# Patient Record
Sex: Male | Born: 1955
Health system: Southern US, Community
[De-identification: ages and names within clinical notes are randomized; demographics above are authoritative.]

## PROBLEM LIST (undated history)

## (undated) DIAGNOSIS — E119 Type 2 diabetes mellitus without complications: Secondary | ICD-10-CM

## (undated) DIAGNOSIS — N189 Chronic kidney disease, unspecified: Secondary | ICD-10-CM

## (undated) DIAGNOSIS — I639 Cerebral infarction, unspecified: Secondary | ICD-10-CM

## (undated) DIAGNOSIS — I10 Essential (primary) hypertension: Secondary | ICD-10-CM

## (undated) DIAGNOSIS — G43909 Migraine, unspecified, not intractable, without status migrainosus: Secondary | ICD-10-CM

## (undated) DIAGNOSIS — E785 Hyperlipidemia, unspecified: Secondary | ICD-10-CM

## (undated) DIAGNOSIS — M109 Gout, unspecified: Secondary | ICD-10-CM

## (undated) DIAGNOSIS — E559 Vitamin D deficiency, unspecified: Secondary | ICD-10-CM

## (undated) HISTORY — DX: Gout, unspecified: M10.9

## (undated) HISTORY — DX: Chronic kidney disease, unspecified: N18.9

## (undated) HISTORY — DX: Hyperlipidemia, unspecified: E78.5

## (undated) HISTORY — DX: Cerebral infarction, unspecified: I63.9

## (undated) HISTORY — DX: Migraine, unspecified, not intractable, without status migrainosus: G43.909

## (undated) HISTORY — PX: HERNIA REPAIR: SHX51

## (undated) HISTORY — DX: Essential (primary) hypertension: I10

---
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
## 1999-03-02 ENCOUNTER — Emergency Department (HOSPITAL_COMMUNITY): Admission: EM | Admit: 1999-03-02 | Discharge: 1999-03-02 | Payer: Self-pay | Admitting: Emergency Medicine

## 1999-03-02 ENCOUNTER — Encounter: Payer: Self-pay | Admitting: Emergency Medicine

## 1999-03-08 ENCOUNTER — Inpatient Hospital Stay (HOSPITAL_COMMUNITY): Admission: EM | Admit: 1999-03-08 | Discharge: 1999-03-11 | Payer: Self-pay | Admitting: Nephrology

## 1999-03-09 ENCOUNTER — Encounter: Payer: Self-pay | Admitting: *Deleted

## 2004-06-13 ENCOUNTER — Emergency Department (HOSPITAL_COMMUNITY): Admission: EM | Admit: 2004-06-13 | Discharge: 2004-06-13 | Payer: Self-pay | Admitting: *Deleted

## 2008-10-31 HISTORY — PX: COLONOSCOPY: SHX174

## 2009-11-04 ENCOUNTER — Encounter: Admission: RE | Admit: 2009-11-04 | Discharge: 2009-11-04 | Payer: Self-pay | Admitting: Emergency Medicine

## 2009-11-05 ENCOUNTER — Encounter: Admission: RE | Admit: 2009-11-05 | Discharge: 2009-11-05 | Payer: Self-pay | Admitting: Emergency Medicine

## 2011-03-18 NOTE — H&P (Signed)
NAME:  MAKAIO, GUNNERSON                        ACCOUNT NO.:  [id number]   MEDICAL RECORD NO.:  [id number]                   PATIENT TYPE:  EMS   LOCATION:  [facility]                                 FACILITY:  [facility]   PHYSICIAN:  Pramod P. Leonie Man, MD                 DATE OF BIRTH:  Mar 19, 1956   DATE OF ADMISSION:  06/13/2004  DATE OF DISCHARGE:                                HISTORY & PHYSICAL   REASON FOR ADMISSION:  Stroke.   HISTORY OF PRESENT ILLNESS:  Mr. Ruben Camacho is a 55 year old African-American  gentleman who developed sudden onset of right-sided body weakness and  numbness about an hour and one-half prior to coming to the emergency room.  Code stroke was called at 1:30 p.m.  I responded immediately.  The patient  was seen at 1:50 p.m.  CT scan of the head was interpreted at that time by  me which showed no acute abnormality.  The patient states that he has  developed left frontal hemicranial throbbing headache about one hour prior  to onset of focal neurological symptoms.  The headache has persisted.  There  was sudden onset of right-sided weakness and numbness and difficulty using  the right side.  This had improved slightly since he came to the emergency  room.  He denies any blurred vision, nausea, vomiting.  The patient states  he has history of similar episode in May 2000 when he had numbness and  weakness on one side of his body which lasted several hours and improved  after overnight admission.  At that time, he underwent CT scan and MRI scan  which were both unremarkable.  He denies other history of TIAs or stroke.   PAST MEDICAL HISTORY:  Not significant for hypertension, diabetes,  hyperlipidemia.   HOME MEDICATIONS:  None.   MEDICATION ALLERGIES:  None.   PAST SURGICAL HISTORY:  None.   SOCIAL HISTORY:  The patient works as a Pharmacist, hospital.  Denies smoking, drinking,  or abusing drugs.   FAMILY HISTORY:  Significant for stroke.   REVIEW OF SYSTEMS:  Not significant  for any chest pain, cough, shortness of  breath, diarrhea.   PHYSICAL EXAMINATION:  GENERAL: Anxious young male who is not in distress.  VITAL SIGNS:  Afebrile.  Pulse 80 per minute and regular,  respiratory rate  16 per minute, blood pressure 130/80.  Distal pulses well felt.  HEENT:  Head is atraumatic.  ENT exam unremarkable  NECK:  Supple without bruit.  CARDIAC:  No murmur or gallop.  LUNGS:  Clear to auscultation.  NEUROLOGIC:  The patient appears quite anxious.  He has a bizarre affect.  He has very deliberate movements on the right side.  His effort is clearly  poor and variable.  His eye movements are full without nystagmus.  Pupils  equally react to light and accommodation.  There is no facial weakness.  He  has slight  limitation of rightward gaze, but I believe this is due to lack  of cooperation.  There is no visual field deficit to bedside confrontational  testing.  He follows commands appropriately.  There is no dysarthria or  aphasia.  He is well oriented to time, place, and person.  Motor system exam  reveals poor and variable effort on the right side.  At times patient is  barely able to lift the right upper and lower extremities off the bed and  fall down quickly; however, when I hold his arm up against gravity and  distract him and leave my hand, he is able to sustain posture against  gravity for a few seconds and then abruptly he drops it down.  He splits the  midline and complains of subjective decreased touch and  pinprick on the  right side of the body including the face.  Reflexes are symmetric, but  downgoing.  Coordination is slow and impaired on the right.   DATA REVIEWED:  CT scan of the head interpreted by me shows no evidence of  acute abnormality or old stroke.   MRI scan of the brain, primary interpretation of diffusion-weighted images  reveals no evidence of acute infarction.   MRA of the intracranial circulation also reveals no high-grade vascular   stenosis.   Previous MRI scan of the brain from May 2000 shows it to be normal without  any infarction.   IMPRESSION:  A 55 year old gentleman with no significant vascular risk  factors with sudden onset of right body paresthesias and weakness with  clearly significant  underlying anxiety, stress, and variable exam likely  secondary to non-organic weakness.  Perhaps conversion reaction due to  underlying stress is more likely.   PLAN:  1. The patient will be admitted to the stroke service for observation.  2. Will consult psychiatry to help with identifying and treating his     underlying psychological distress.  3. Social work will be contacted to help take care of his children who are     at home.  The patient's wife is in [place], and he is clearly in     psychological distress from this.  4. Aspirin 81 mg a day.  5. No further stroke workup for now.  6. The patient does not have a primary care physician.  7. He may need some outpatient psychiatric followup                                                Pramod P. Leonie Man, MD    PPS/MEDQ  D:  06/13/2004  T:  06/13/2004  Job:  [id number]

## 2011-11-05 ENCOUNTER — Ambulatory Visit (INDEPENDENT_AMBULATORY_CARE_PROVIDER_SITE_OTHER): Payer: BC Managed Care – PPO

## 2011-11-05 DIAGNOSIS — M109 Gout, unspecified: Secondary | ICD-10-CM

## 2011-11-05 DIAGNOSIS — I10 Essential (primary) hypertension: Secondary | ICD-10-CM

## 2011-11-30 ENCOUNTER — Ambulatory Visit (INDEPENDENT_AMBULATORY_CARE_PROVIDER_SITE_OTHER): Payer: BC Managed Care – PPO | Admitting: Family Medicine

## 2011-11-30 DIAGNOSIS — E785 Hyperlipidemia, unspecified: Secondary | ICD-10-CM

## 2011-11-30 DIAGNOSIS — M109 Gout, unspecified: Secondary | ICD-10-CM | POA: Insufficient documentation

## 2011-11-30 DIAGNOSIS — I10 Essential (primary) hypertension: Secondary | ICD-10-CM

## 2011-11-30 DIAGNOSIS — D561 Beta thalassemia: Secondary | ICD-10-CM

## 2011-11-30 DIAGNOSIS — Z0000 Encounter for general adult medical examination without abnormal findings: Secondary | ICD-10-CM

## 2011-11-30 DIAGNOSIS — E1122 Type 2 diabetes mellitus with diabetic chronic kidney disease: Secondary | ICD-10-CM | POA: Insufficient documentation

## 2011-11-30 MED ORDER — PREDNISONE 20 MG PO TABS: 40.0000 mg | ORAL_TABLET | Freq: Every day | ORAL | Status: AC

## 2011-11-30 MED ORDER — CYCLOBENZAPRINE HCL 10 MG PO TABS: 10.0000 mg | ORAL_TABLET | Freq: Two times a day (BID) | ORAL | Status: AC | PRN

## 2011-11-30 MED ORDER — HYDROCODONE-ACETAMINOPHEN 5-500 MG PO TABS: 1.0000 | ORAL_TABLET | Freq: Three times a day (TID) | ORAL | Status: AC | PRN
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
## 2012-02-26 ENCOUNTER — Other Ambulatory Visit: Payer: Self-pay | Admitting: Family Medicine

## 2012-07-01 DIAGNOSIS — I639 Cerebral infarction, unspecified: Secondary | ICD-10-CM

## 2012-07-01 HISTORY — DX: Cerebral infarction, unspecified: I63.9

## 2012-07-01 HISTORY — PX: OTHER SURGICAL HISTORY: SHX169

## 2012-07-11 ENCOUNTER — Ambulatory Visit: Payer: BC Managed Care – PPO | Attending: Family Medicine | Admitting: Rehabilitative and Restorative Service Providers"

## 2012-07-11 DIAGNOSIS — R488 Other symbolic dysfunctions: Secondary | ICD-10-CM | POA: Insufficient documentation

## 2012-07-11 DIAGNOSIS — R269 Unspecified abnormalities of gait and mobility: Secondary | ICD-10-CM | POA: Insufficient documentation

## 2012-07-11 DIAGNOSIS — M6281 Muscle weakness (generalized): Secondary | ICD-10-CM | POA: Insufficient documentation

## 2012-07-11 DIAGNOSIS — IMO0001 Reserved for inherently not codable concepts without codable children: Secondary | ICD-10-CM | POA: Insufficient documentation

## 2012-07-11 DIAGNOSIS — I69919 Unspecified symptoms and signs involving cognitive functions following unspecified cerebrovascular disease: Secondary | ICD-10-CM | POA: Insufficient documentation

## 2012-07-11 DIAGNOSIS — I69998 Other sequelae following unspecified cerebrovascular disease: Secondary | ICD-10-CM | POA: Insufficient documentation

## 2012-07-16 ENCOUNTER — Ambulatory Visit: Payer: BC Managed Care – PPO | Admitting: Rehabilitative and Restorative Service Providers"

## 2012-07-16 ENCOUNTER — Encounter: Payer: Self-pay | Admitting: Occupational Therapy

## 2012-07-18 ENCOUNTER — Ambulatory Visit: Payer: BC Managed Care – PPO | Admitting: Rehabilitative and Restorative Service Providers"

## 2012-07-23 ENCOUNTER — Ambulatory Visit: Payer: BC Managed Care – PPO | Admitting: Rehabilitative and Restorative Service Providers"

## 2012-07-25 ENCOUNTER — Ambulatory Visit: Payer: BC Managed Care – PPO | Admitting: Rehabilitative and Restorative Service Providers"

## 2012-07-25 ENCOUNTER — Ambulatory Visit: Payer: BC Managed Care – PPO

## 2012-07-30 ENCOUNTER — Ambulatory Visit: Payer: BC Managed Care – PPO | Admitting: Rehabilitative and Restorative Service Providers"

## 2012-08-01 ENCOUNTER — Ambulatory Visit: Payer: BC Managed Care – PPO | Attending: Family Medicine | Admitting: Rehabilitative and Restorative Service Providers"

## 2012-08-01 DIAGNOSIS — R269 Unspecified abnormalities of gait and mobility: Secondary | ICD-10-CM | POA: Insufficient documentation

## 2012-08-01 DIAGNOSIS — R488 Other symbolic dysfunctions: Secondary | ICD-10-CM | POA: Insufficient documentation

## 2012-08-01 DIAGNOSIS — M6281 Muscle weakness (generalized): Secondary | ICD-10-CM | POA: Insufficient documentation

## 2012-08-01 DIAGNOSIS — I69919 Unspecified symptoms and signs involving cognitive functions following unspecified cerebrovascular disease: Secondary | ICD-10-CM | POA: Insufficient documentation

## 2012-08-01 DIAGNOSIS — I69998 Other sequelae following unspecified cerebrovascular disease: Secondary | ICD-10-CM | POA: Insufficient documentation

## 2012-08-01 DIAGNOSIS — IMO0001 Reserved for inherently not codable concepts without codable children: Secondary | ICD-10-CM | POA: Insufficient documentation

## 2012-08-06 ENCOUNTER — Ambulatory Visit: Payer: BC Managed Care – PPO | Admitting: Rehabilitative and Restorative Service Providers"

## 2012-08-08 ENCOUNTER — Ambulatory Visit: Payer: BC Managed Care – PPO | Admitting: Rehabilitative and Restorative Service Providers"

## 2012-08-13 ENCOUNTER — Ambulatory Visit: Payer: BC Managed Care – PPO | Admitting: Speech Pathology

## 2012-08-15 ENCOUNTER — Ambulatory Visit: Payer: BC Managed Care – PPO

## 2012-08-20 ENCOUNTER — Ambulatory Visit: Payer: BC Managed Care – PPO

## 2012-08-22 ENCOUNTER — Ambulatory Visit: Payer: BC Managed Care – PPO

## 2012-08-23 ENCOUNTER — Ambulatory Visit: Payer: BC Managed Care – PPO | Admitting: Rehabilitative and Restorative Service Providers"

## 2012-08-24 ENCOUNTER — Ambulatory Visit: Payer: Self-pay | Admitting: Family Medicine

## 2012-08-27 ENCOUNTER — Ambulatory Visit: Payer: BC Managed Care – PPO

## 2012-08-29 ENCOUNTER — Ambulatory Visit: Payer: BC Managed Care – PPO

## 2012-08-31 ENCOUNTER — Ambulatory Visit: Payer: BC Managed Care – PPO | Admitting: Rehabilitative and Restorative Service Providers"

## 2012-09-03 ENCOUNTER — Ambulatory Visit: Payer: BC Managed Care – PPO | Admitting: Rehabilitative and Restorative Service Providers"

## 2012-09-03 ENCOUNTER — Ambulatory Visit: Payer: BC Managed Care – PPO | Attending: Family Medicine

## 2012-09-03 DIAGNOSIS — I69919 Unspecified symptoms and signs involving cognitive functions following unspecified cerebrovascular disease: Secondary | ICD-10-CM | POA: Insufficient documentation

## 2012-09-03 DIAGNOSIS — IMO0001 Reserved for inherently not codable concepts without codable children: Secondary | ICD-10-CM | POA: Insufficient documentation

## 2012-09-03 DIAGNOSIS — I69998 Other sequelae following unspecified cerebrovascular disease: Secondary | ICD-10-CM | POA: Insufficient documentation

## 2012-09-03 DIAGNOSIS — R269 Unspecified abnormalities of gait and mobility: Secondary | ICD-10-CM | POA: Insufficient documentation

## 2012-09-03 DIAGNOSIS — R488 Other symbolic dysfunctions: Secondary | ICD-10-CM | POA: Insufficient documentation

## 2012-09-03 DIAGNOSIS — M6281 Muscle weakness (generalized): Secondary | ICD-10-CM | POA: Insufficient documentation

## 2012-09-05 ENCOUNTER — Ambulatory Visit: Payer: BC Managed Care – PPO | Admitting: Rehabilitative and Restorative Service Providers"

## 2012-09-18 ENCOUNTER — Ambulatory Visit: Payer: BC Managed Care – PPO | Admitting: Rehabilitative and Restorative Service Providers"

## 2012-09-20 ENCOUNTER — Ambulatory Visit: Payer: BC Managed Care – PPO | Admitting: Rehabilitative and Restorative Service Providers"

## 2012-09-24 ENCOUNTER — Ambulatory Visit (HOSPITAL_BASED_OUTPATIENT_CLINIC_OR_DEPARTMENT_OTHER): Payer: BC Managed Care – PPO | Attending: Family Medicine | Admitting: Radiology

## 2012-09-24 VITALS — Ht 70.0 in | Wt 207.0 lb

## 2012-09-24 DIAGNOSIS — G4733 Obstructive sleep apnea (adult) (pediatric): Secondary | ICD-10-CM | POA: Insufficient documentation

## 2012-09-25 ENCOUNTER — Ambulatory Visit: Payer: BC Managed Care – PPO | Admitting: Rehabilitative and Restorative Service Providers"

## 2012-09-30 DIAGNOSIS — R0989 Other specified symptoms and signs involving the circulatory and respiratory systems: Secondary | ICD-10-CM

## 2012-09-30 DIAGNOSIS — G4733 Obstructive sleep apnea (adult) (pediatric): Secondary | ICD-10-CM

## 2012-09-30 DIAGNOSIS — G4737 Central sleep apnea in conditions classified elsewhere: Secondary | ICD-10-CM

## 2012-09-30 DIAGNOSIS — R0609 Other forms of dyspnea: Secondary | ICD-10-CM

## 2012-09-30 DIAGNOSIS — M62838 Other muscle spasm: Secondary | ICD-10-CM

## 2012-10-01 ENCOUNTER — Ambulatory Visit: Payer: BC Managed Care – PPO | Admitting: Rehabilitative and Restorative Service Providers"

## 2012-10-01 NOTE — Procedures (Signed)
NAME:  Ruben Camacho, Ruben Camacho              ACCOUNT NO.:  [id number]  MEDICAL RECORD NO.:  [id number]          PATIENT TYPE:  OUT  LOCATION:  SLEEP CENTER                 [facility]  PHYSICIAN:  Clinton D. Annamaria Boots, MD, FCCP, FACPDATE OF BIRTH:  12/09/1955  DATE OF STUDY:  09/24/2012                           NOCTURNAL POLYSOMNOGRAM  REFERRING PHYSICIAN:  DESTRY G TAYLOR  INDICATION FOR STUDY:  Hypersomnia with sleep apnea.  EPWORTH SLEEPINESS SCORE:  10/24.  BMI 29.7, weight 207 pounds, height 70 inches, neck 17 inches.  MEDICATIONS:  Home medications are charted and reviewed.  SLEEP ARCHITECTURE:  Split study protocol.  During the diagnostic phase, total sleep time 120 minutes with sleep efficiency 70.6%.  Stage I was 2.9%, stage II 97.1%, stages III and REM were absent.  Sleep latency 48.5 minutes.  Awake after sleep onset 1.5 minutes.  Arousal index 10.5. Bedtime medication:  None.  RESPIRATORY DATA:  Split study protocol.  Apnea-hypopnea index (AHI) 15 per hour.  A total of 30 events was scored including 10 obstructive apneas and 20 hypopneas.  Events were associated with supine sleep position.  CPAP was then titrated to 8 CWP with residual central events giving an AHI of 20 per hour.  Obstructive events were completely suppressed.  He wore a medium ResMed Quattro FX full-face mask with heated humidifier.  OXYGEN DATA:  Moderate snoring before CPAP with oxygen desaturation to a nadir of 88% on room air.  With CPAP titration, snoring was prevented and mean oxygen saturation held 93.4% on room air.  CARDIAC DATA:  Normal sinus rhythm.  MOVEMENT-PARASOMNIA:  During the diagnostic phase, a total of 36 limb jerks were counted of which 1 was associated with arousal or awakening. During the titration phase, there were no limb of events.  Bathroom x1.  IMPRESSIONS-RECOMMENDATIONS: 1. Moderate obstructive and central sleep apnea/hypopnea syndrome, AHI     15 per hour.  Supine  events.  Moderate snoring with oxygen     desaturation to a nadir of 88% on room air. 2. CPAP titration to 8 CWP suppressed all obstructive events.     Residual central apneas were not affected by CPAP, as expected,     leaving a residual AHI of 20 per hour during the time interval     recorded.  He wore a medium ResMed Quattro FX full-face mask with     heated humidifier.  Snoring was prevented by CPAP, and mean oxygen     saturation held 93.4% on room air.  Limb jerks were present during     the diagnostic phase, but not     present during the titration phase, indicating they were part of an     arousal response to respiratory events, suppressed by CPAP.     Clinton D. Annamaria Boots, MD, [facility], [place], [place] Board of Sleep Medicine    CDY/MEDQ  D:  09/30/2012 17:03:24  T:  10/01/2012 03:10:27  Job:  [id number]

## 2012-10-03 ENCOUNTER — Ambulatory Visit: Payer: BC Managed Care – PPO | Admitting: Rehabilitative and Restorative Service Providers"

## 2012-10-08 ENCOUNTER — Ambulatory Visit: Payer: BC Managed Care – PPO | Admitting: Rehabilitative and Restorative Service Providers"

## 2012-10-12 ENCOUNTER — Ambulatory Visit: Payer: BC Managed Care – PPO | Admitting: Rehabilitative and Restorative Service Providers"

## 2013-03-19 ENCOUNTER — Ambulatory Visit: Payer: BC Managed Care – PPO

## 2013-03-19 ENCOUNTER — Ambulatory Visit (INDEPENDENT_AMBULATORY_CARE_PROVIDER_SITE_OTHER): Payer: BC Managed Care – PPO | Admitting: Family Medicine

## 2013-03-19 VITALS — BP 120/78 | HR 76 | Temp 99.1°F | Resp 16 | Ht 70.0 in | Wt 206.0 lb

## 2013-03-19 DIAGNOSIS — R05 Cough: Secondary | ICD-10-CM

## 2013-03-19 DIAGNOSIS — R209 Unspecified disturbances of skin sensation: Secondary | ICD-10-CM

## 2013-03-19 DIAGNOSIS — R200 Anesthesia of skin: Secondary | ICD-10-CM

## 2013-03-19 DIAGNOSIS — R5381 Other malaise: Secondary | ICD-10-CM

## 2013-03-19 DIAGNOSIS — R51 Headache: Secondary | ICD-10-CM

## 2013-03-19 DIAGNOSIS — J029 Acute pharyngitis, unspecified: Secondary | ICD-10-CM

## 2013-03-19 DIAGNOSIS — R718 Other abnormality of red blood cells: Secondary | ICD-10-CM

## 2013-03-19 DIAGNOSIS — R059 Cough, unspecified: Secondary | ICD-10-CM

## 2013-03-19 DIAGNOSIS — R202 Paresthesia of skin: Secondary | ICD-10-CM

## 2013-03-19 LAB — FERRITIN: Ferritin: 220 ng/mL (ref 22–322)

## 2013-03-19 LAB — POCT INFLUENZA A/B: Influenza B, POC: NEGATIVE

## 2013-03-19 LAB — COMPREHENSIVE METABOLIC PANEL
ALT: 26 U/L (ref 0–53)
Albumin: 4.6 g/dL (ref 3.5–5.2)
Potassium: 4.3 mEq/L (ref 3.5–5.3)
Sodium: 137 mEq/L (ref 135–145)

## 2013-03-19 LAB — POCT CBC
HCT, POC: 48.7 % (ref 43.5–53.7)
Lymph, poc: 1.7 (ref 0.6–3.4)
MCH, POC: 23.9 pg — AB (ref 27–31.2)
MCV: 76.9 fL — AB (ref 80–97)
MPV: 10.5 fL (ref 0–99.8)
Platelet Count, POC: 248 10*3/uL (ref 142–424)
RDW, POC: 16.3 %
WBC: 5.1 10*3/uL (ref 4.6–10.2)

## 2013-03-19 LAB — POCT RAPID STREP A (OFFICE): Rapid Strep A Screen: NEGATIVE

## 2013-03-19 MED ORDER — DOXYCYCLINE HYCLATE 100 MG PO TABS: 100.0000 mg | ORAL_TABLET | Freq: Two times a day (BID) | ORAL | Status: DC

## 2013-03-19 MED ORDER — ACETAMINOPHEN 325 MG PO TABS: 500.0000 mg | ORAL_TABLET | Freq: Four times a day (QID) | ORAL | Status: DC | PRN

## 2013-03-19 NOTE — Progress Notes (Signed)
[facility] [address] [place] [address] [phone]  Date:  03/19/2013   Name:  Ruben Camacho.   DOB:  1956/09/11   MRN:  [id number]  PCP:  No primary provider on file.    Chief Complaint: Sore Throat, Headache, Generalized Body Aches and Numbness   History of Present Illness:  Ruben Camacho. is a 57 y.o. very pleasant male patient who presents with the following:  Today is Tuesday.  A lot of people were sick at his job last week. He started to get sick last Tuesday- a week ago.  He noted headache, fatigue, joints aching, ST, mild cough.  He was not aware of any fever.  No chills.  He has some coughing spells, but no runny nose or sneezing.  He has had intermittent headaches since he became ill.  This is not the worst HA of his life.  He has been taking his BP medication, but has not taken anything else for his symptoms.    He had a CVA in 9/ 2013.  He had left side numbness and speech problems.  He did recover fully.  He now takes an aspirin daily, as well as BP medications.  The stroke was thought due to his HTN.    As of last Thursday he noted persistent numbness in the tip of his right long finger, and intermittent numbness in the index and ring fingers of the right hand.  Actually, he has had intermittent numbness of the right hand for about one month.   He does feel that it can be hard to hold objects in the right hand when the numbness is worse, and the fingers can be painful as well.  He has pain especially with typing. Sometimes the fingers will cramp and "curl up" as well.  He has not noted any speech problems or facial drooping.  He will sometimes have left hand numbness as well, but this goes away if he moves the hand. This does not seem similar to his stroke symptoms last year.     His PCP is in [place], [place] where he works. He was treated at [facility] for his CVA.  He had a complete w/u it was determined that his  stroke was due to HTN.  He now faithfully takes his BP medications.   Patient Active Problem List   Diagnosis Date Noted  . Hypertension 11/30/2011  . Hyperlipemia 11/30/2011  . Gout 11/30/2011  . Beta thalassemia 11/30/2011    Past Medical History  Diagnosis Date  . Hypertension   . Stroke   . Hyperlipidemia     Past Surgical History  Procedure Laterality Date  . Hernia repair      History  Substance Use Topics  . Smoking status: Never Smoker   . Smokeless tobacco: Not on file  . Alcohol Use: Not on file     Comment: former drinker    Family History  Problem Relation Age of Onset  . Stroke Mother   . Hypertension Mother   . Hypertension Daughter   . Cancer Maternal Grandmother     lung    No Known Allergies  Medication list has been reviewed and updated.  Current Outpatient Prescriptions on File Prior to Visit  Medication Sig Dispense Refill  . amLODipine (NORVASC) 10 MG tablet Take 1 tablet (10 mg total) by mouth daily. NEEDS OFFICE VISIT/LABS FOR MORE  15 tablet  0  . amLODipine-benazepril (  LOTREL) 10-20 MG per capsule Take 1 capsule by mouth daily.      Marland Kitchen lisinopril (PRINIVIL,ZESTRIL) 20 MG tablet Take 20 mg by mouth daily.      . metoprolol tartrate (LOPRESSOR) 25 MG tablet Take 25 mg by mouth 2 (two) times daily.       No current facility-administered medications on file prior to visit.    Review of Systems:  As per HPI- otherwise negative.   Physical Examination: Filed Vitals:   03/19/13 0750  BP: 120/78  Pulse: 76  Temp: 99.1 F (37.3 C)  Resp: 16   Filed Vitals:   03/19/13 0750  Height: 5\' 10"  (1.778 m)  Weight: 206 lb (93.441 kg)   Body mass index is 29.56 kg/(m^2). Ideal Body Weight: Weight in (lb) to have BMI = 25: 173.9  GEN: WDWN, NAD, Non-toxic, A & O x 3,looks well HEENT: Atraumatic, Normocephalic. Neck supple. No masses, No LAD.  Bilateral TM wnl, oropharynx normal.  PEERL,EOMI.   Ears and Nose: No external deformity. CV:  RRR, No M/G/R. No JVD. No thrill. No extra heart sounds. PULM: CTA B, no wheezes, crackles, rhonchi. No retractions. No resp. distress. No accessory muscle use. ABD: S, NT, ND EXTR: No c/c/e NEURO Normal gait. Normal strength and sensation of all extremities (except slightly decresed right hand grip strength due to hand pain), normal DTR all extremities, negative romberg, no facial droop or weakness, negative arm drift.  PSYCH: Normally interactive. Conversant. Not depressed or anxious appearing.  Calm demeanor.  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   No rash to suggest RMSF  [facility] reading (PRIMARY) by  Dr. Lorelei Pont. CXR: negative Cervical Spine: degenerative change at C4-5-6  CHEST - 2 VIEW  Comparison: None.  Findings: Cardiomediastinal silhouette appears normal. No acute pulmonary disease is noted. Bony thorax is intact.  IMPRESSION: No acute cardiopulmonary abnormality seen.   CERVICAL SPINE - COMPLETE 4+ VIEW  Comparison: None.  Findings: No fracture or spondylolisthesis is noted. Mild  degenerative disc disease is noted at C4-5 with anterior osteophyte  formation. Mild degenerative disc disease is also noted at C5-6  and C6-7 with anterior osteophyte formation. Posterior facet  joints appear intact. Minimal osteophyte formation is seen on the  left at C4-5, but no significant neural foraminal stenosis is seen  on either side.  IMPRESSION:  Degenerative disc disease is noted at C4-5, C5-6 and C6-7. No  other significant abnormality seen in the cervical spine.    Results for orders placed in visit on 03/19/13  POCT INFLUENZA A/B      Result Value Range   Influenza A, POC Negative     Influenza B, POC Negative    POCT RAPID STREP A (OFFICE)      Result Value Range   Rapid Strep A Screen Negative  Negative  POCT CBC      Result Value Range   WBC 5.1  4.6 - 10.2 K/uL   Lymph, poc 1.7  0.6 - 3.4   POC LYMPH PERCENT  33.7  10 - 50 %L   MID (cbc) 0.4  0 - 0.9   POC MID % 7.0  0 - 12 %M   POC Granulocyte 3.0  2 - 6.9   Granulocyte percent 59.3  37 - 80 %G   RBC 6.33 (*) 4.69 - 6.13 M/uL   Hemoglobin 15.1  14.1 - 18.1 g/dL   HCT, POC 48.7  43.5 - 53.7 %   MCV 76.9 (*) 80 - 97 fL   MCH, POC 23.9 (*) 27 - 31.2 pg   MCHC 31.0 (*) 31.8 - 35.4 g/dL   RDW, POC 16.3     Platelet Count, POC 248  142 - 424 K/uL   MPV 10.5  0 - 99.8 fL    Assessment and Plan: Acute pharyngitis - Plan: POCT Influenza A/B, POCT rapid strep A  Cough - Plan: DG Chest 2 View, doxycycline (VIBRA-TABS) 100 MG tablet  Other malaise and fatigue  Cough, Active - Plan: DG Chest 2 View, doxycycline (VIBRA-TABS) 100 MG tablet  Numbness and tingling in right hand - Plan: DG Cervical Spine Complete  Headache - Plan: POCT CBC, Comprehensive metabolic panel, acetaminophen (TYLENOL) tablet 487.5 mg  Microcytosis - Plan: Ferritin  Ruben Camacho is here today with illness and likely right CTS.  Will cover with doxycycline for possible bronchitis, and also in case of tick borne illness with "summer flu" like symptoms.  CXR and CBC reassuring.  Check ferritin due to microcytosis.   Placed in a non- spica right wrist splint for likely CTS.   Discussed his current numbness and weakness in detail.  Consistent with CTS and I do not suspect a current stroke.  However, he will be on alert for any signs of a stroke like he has last year. Holdman also agrees this is nothing like the CVA he had last year.)   Signed Lamar Blinks, MD

## 2013-03-19 NOTE — Patient Instructions (Addendum)
Let me know if you are not feeling better in the next couple of days. Use the doxycycline as directed and tylenol as needed.    Try the wrist brace (especially at night) for the next 2 weeks. If you do not notice any improvement in the next 7- 10 days please let us know.  If you have any weakness like when you had your stroke last year get help right away.  Also be on the look out for any speech difficulty or balance difficulty- these could also be signs of a stroke.  I will be in touch with your iron level.

## 2013-03-20 ENCOUNTER — Encounter: Payer: Self-pay | Admitting: Family Medicine

## 2013-07-10 ENCOUNTER — Ambulatory Visit: Payer: Self-pay | Admitting: Family Medicine

## 2013-07-10 VITALS — BP 122/74 | HR 104 | Temp 98.4°F | Resp 18 | Ht 68.5 in | Wt 206.0 lb

## 2013-07-10 DIAGNOSIS — Z0289 Encounter for other administrative examinations: Secondary | ICD-10-CM

## 2013-07-10 NOTE — Progress Notes (Signed)

## 2013-07-10 NOTE — Progress Notes (Signed)
[place], [place]  [id number]   [phone]  Subjective:    Patient ID: Ruben Appl., male    DOB: 05-15-1956, 57 y.o.   MRN: [id number]  HPI This 57 y.o. male presents for evaluation for administrative CPE.  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    Last physical 12/2012 in [place], [place]. Colonoscopy 2010; repeat ten years. Tetanus vaccine not sure; less than ten years. Hepatitis B series; worked for [employer]; s/p three shot vaccine. Flu vaccine never.   Eye exam every year; +glasses. Dental exam yearly.   Review of Systems  Constitutional: Negative for chills, diaphoresis and fatigue.  HENT: Negative for hearing loss and ear pain.   Eyes: Negative for photophobia and visual disturbance.  Respiratory: Negative for shortness of breath, wheezing and stridor.   Cardiovascular: Negative for chest pain, palpitations and leg swelling.  Gastrointestinal: Negative for nausea, vomiting, abdominal pain, diarrhea and constipation.  Musculoskeletal: Negative for myalgias, back pain, joint swelling, arthralgias and gait problem.  Skin: Negative for rash.  Neurological: Negative for dizziness, speech difficulty, weakness, numbness and headaches.  Psychiatric/Behavioral: Negative for dysphoric mood. The patient is not nervous/anxious.     Past Medical History  Diagnosis Date  . Hypertension   . Hyperlipidemia   . Stroke 07/01/2012    L sided weakness, slurred speech.  Admission [facility].    . Gout     1-2 exacerbations per year.  Takes Advil PRN for pain.    Past Surgical History  Procedure Laterality Date  . Hernia repair    . Admission  07/01/2012    CVA (L sided weakness, slurred speech).  [facility].  . Colonoscopy  10/31/2008    no polyps.  Repeat in 10 years.  [place].    Prior to Admission medications   Medication Sig Start Date End Date Taking? Authorizing Provider  amLODipine (NORVASC) 10 MG tablet Take 1 tablet (10 mg total)  by mouth daily. NEEDS OFFICE VISIT/LABS FOR MORE 02/26/12  Yes Ryan Lyn Hollingshead, PA-C  aspirin 325 MG tablet Take 325 mg by mouth daily.   Yes Historical Provider, MD  hydrochlorothiazide (HYDRODIURIL) 25 MG tablet Take 25 mg by mouth daily.   Yes Historical Provider, MD  lisinopril (PRINIVIL,ZESTRIL) 20 MG tablet Take 40 mg by mouth daily.    Yes Historical Provider, MD  metoprolol succinate (TOPROL-XL) 50 MG 24 hr tablet Take 50 mg by mouth daily. Take with or immediately following a meal.   Yes Historical Provider, MD  pravastatin (PRAVACHOL) 40 MG tablet Take 40 mg by mouth daily.   Yes Historical Provider, MD    No Known Allergies  History   Social History  . Marital Status: Married    Spouse Name: N/A    Number of Children: N/A  . Years of Education: N/A   Occupational History  . Not on file.   Social History Main Topics  . Smoking status: Never Smoker   . Smokeless tobacco: Not on file  . Alcohol Use: Not on file     Comment: former drinker  . Drug Use: No  . Sexual Activity: Yes    Partners: Female   Other Topics Concern  . Not on file   Social History Narrative   Marital status: married      CHildren;  2 children; no grandchildren.      Lives: with wife.      Employment:  Pharmacist, hospital; principal in  past.      Tobacco: none      Alcohol: weekends      Exercise:  Walking; weightlifting; basketball.    Family History  Problem Relation Age of Onset  . Stroke Mother   . Hypertension Mother   . Hypertension Daughter   . Cancer Maternal Grandmother     lung       Objective:   Physical Exam  Nursing note and vitals reviewed. Constitutional: He is oriented to person, place, and time. He appears well-developed and well-nourished. No distress.  HENT:  Head: Normocephalic and atraumatic.  Right Ear: External ear normal.  Left Ear: External ear normal.  Nose: Nose normal.  Mouth/Throat: Oropharynx is clear and moist.  Eyes: Conjunctivae and EOM are normal. Pupils are  equal, round, and reactive to light.  Neck: Normal range of motion. Neck supple. No thyromegaly present.  Cardiovascular: Normal rate, regular rhythm, normal heart sounds and intact distal pulses.  Exam reveals no gallop and no friction rub.   No murmur heard. Pulmonary/Chest: Effort normal and breath sounds normal. He has no wheezes. He has no rales.  Abdominal: Soft. Bowel sounds are normal. There is no tenderness. There is no rebound and no guarding. Hernia confirmed negative in the right inguinal area and confirmed negative in the left inguinal area.  Genitourinary: Testes normal and penis normal. Uncircumcised.  Musculoskeletal: Normal range of motion.  Lymphadenopathy:    He has no cervical adenopathy.  Neurological: He is alert and oriented to person, place, and time. He has normal reflexes. No cranial nerve deficit. He exhibits normal muscle tone. Coordination normal.  Skin: Skin is warm and dry. No rash noted. He is not diaphoretic. No erythema. No pallor.  Psychiatric: He has a normal mood and affect. His behavior is normal. Judgment and thought content normal.       Assessment & Plan:  Other general medical examination for administrative purposes   1. Administrative CPE: clearance for employment.  Immunizations UTD; refuses flu vaccines.  No symptoms or risk factors for TB; thus, does not warrant Tb skin testing.  Note provided.

## 2013-08-08 ENCOUNTER — Encounter: Payer: Self-pay | Admitting: Emergency Medicine

## 2013-09-07 ENCOUNTER — Ambulatory Visit (INDEPENDENT_AMBULATORY_CARE_PROVIDER_SITE_OTHER): Payer: BC Managed Care – PPO | Admitting: Family Medicine

## 2013-09-07 VITALS — BP 132/76 | HR 85 | Temp 97.9°F | Resp 16 | Ht 69.0 in | Wt 201.0 lb

## 2013-09-07 DIAGNOSIS — E785 Hyperlipidemia, unspecified: Secondary | ICD-10-CM

## 2013-09-07 DIAGNOSIS — I10 Essential (primary) hypertension: Secondary | ICD-10-CM

## 2013-09-07 DIAGNOSIS — N529 Male erectile dysfunction, unspecified: Secondary | ICD-10-CM

## 2013-09-07 LAB — COMPREHENSIVE METABOLIC PANEL
ALT: 34 U/L (ref 0–53)
AST: 28 U/L (ref 0–37)
Alkaline Phosphatase: 60 U/L (ref 39–117)
BUN: 22 mg/dL (ref 6–23)
Creat: 1.28 mg/dL (ref 0.50–1.35)
Glucose, Bld: 139 mg/dL — ABNORMAL HIGH (ref 70–99)

## 2013-09-07 LAB — LIPID PANEL: Cholesterol: 187 mg/dL (ref 0–200)

## 2013-09-07 MED ORDER — HYDROCHLOROTHIAZIDE 25 MG PO TABS: 25.0000 mg | ORAL_TABLET | Freq: Every day | ORAL | Status: DC

## 2013-09-07 MED ORDER — LISINOPRIL 40 MG PO TABS: 40.0000 mg | ORAL_TABLET | Freq: Every day | ORAL | Status: DC

## 2013-09-07 MED ORDER — AMLODIPINE BESYLATE 10 MG PO TABS: 10.0000 mg | ORAL_TABLET | Freq: Every day | ORAL | Status: DC

## 2013-09-07 MED ORDER — METOPROLOL SUCCINATE ER 50 MG PO TB24: 50.0000 mg | ORAL_TABLET | Freq: Every day | ORAL | Status: DC

## 2013-09-07 MED ORDER — SILDENAFIL CITRATE 100 MG PO TABS: 50.0000 mg | ORAL_TABLET | Freq: Every day | ORAL | Status: DC | PRN

## 2013-09-07 MED ORDER — PRAVASTATIN SODIUM 40 MG PO TABS: 40.0000 mg | ORAL_TABLET | Freq: Every day | ORAL | Status: DC

## 2013-09-07 NOTE — Progress Notes (Addendum)
Subjective:  This chart was scribed for Ruben Ray, MD by Roxan Diesel, ED scribe.  This patient was seen in room [facility] Room 1 and the patient's care was started at 8:54 AM.   Patient ID: Ruben Appl., male    DOB: 11-24-1955, 57 y.o.   MRN: [id number]  Chief Complaint  Patient presents with  . Medication Refill    hydrodiuril, lisinopril, toprol-xl, pravachol    HPI  Ruben Camacho. is a 57 y.o. male PCP: None per patient.  Recently relocated and looking for a new PCP.  Pt presents for a medication refill and labs.  He is not completely fasting - had coffee this morning with half-and-half and Splenda.  No other food or drinks today.  1. Hypertension.  CMP in May with normal creatinine at 1.27 but glucose elevated at 116.  His BP is usually in the 130s/70s at home.  He denies medication side effects to his knowledge.  2. Hyperlipidemia.  No recent lipid panel but LFT's normal in May of this year.  3. ED for 6 months.  Pt was taking Viagra and states this was effective.  He has never had testosterone levels checked.  4. History of stroke in September 2012 due to uncontrolled HTN.  States that he had some speech difficulties and left-sided deficits afterwards which were treated in therapy.  5. State he has some pain in the instep of his left foot.  He does not feel that this is gout-related and states that he may need arch support.  He has not yet attempted arch support.    Patient Active Problem List   Diagnosis Date Noted  . Hypertension 11/30/2011  . Hyperlipemia 11/30/2011  . Gout 11/30/2011  . Beta thalassemia 11/30/2011   Past Medical History  Diagnosis Date  . Hypertension   . Hyperlipidemia   . Stroke 07/01/2012    L sided weakness, slurred speech.  Admission [facility].    . Gout     1-2 exacerbations per year.  Takes Advil PRN for pain.   Past Surgical History  Procedure Laterality Date  . Hernia repair    . Admission  07/01/2012     CVA (L sided weakness, slurred speech).  [facility].  . Colonoscopy  10/31/2008    no polyps.  Repeat in 10 years.  [place].   No Known Allergies Prior to Admission medications   Medication Sig Start Date End Date Taking? Authorizing Provider  amLODipine (NORVASC) 10 MG tablet Take 1 tablet (10 mg total) by mouth daily. NEEDS OFFICE VISIT/LABS FOR MORE 02/26/12  Yes Ryan Lyn Hollingshead, PA-C  aspirin 325 MG tablet Take 325 mg by mouth daily.   Yes Historical Provider, MD  hydrochlorothiazide (HYDRODIURIL) 25 MG tablet Take 25 mg by mouth daily.   Yes Historical Provider, MD  lisinopril (PRINIVIL,ZESTRIL) 20 MG tablet Take 40 mg by mouth daily.    Yes Historical Provider, MD  metoprolol succinate (TOPROL-XL) 50 MG 24 hr tablet Take 50 mg by mouth daily. Take with or immediately following a meal.   Yes Historical Provider, MD  pravastatin (PRAVACHOL) 40 MG tablet Take 40 mg by mouth daily.   Yes Historical Provider, MD   History   Social History  . Marital Status: Married    Spouse Name: N/A    Number of Children: N/A  . Years of Education: N/A   Occupational History  . Not on file.   Social History Main Topics  . Smoking status:  Never Smoker   . Smokeless tobacco: Not on file  . Alcohol Use: No     Comment: former drinker  . Drug Use: No  . Sexual Activity: Yes    Partners: Female   Other Topics Concern  . Not on file   Social History Narrative   Marital status: married      CHildren;  2 children; no grandchildren.      Lives: with wife.      Employment:  Pharmacist, hospital; principal in past.      Tobacco: none      Alcohol: weekends      Exercise:  Walking; weightlifting; basketball.     Review of Systems  Constitutional: Negative for fatigue and unexpected weight change.  Eyes: Negative for visual disturbance.  Respiratory: Negative for cough, chest tightness and shortness of breath.   Cardiovascular: Negative for chest pain, palpitations and leg swelling.  Gastrointestinal: Negative for  abdominal pain and blood in stool.  Genitourinary:       ED  Musculoskeletal:       Pain in arch of left foot  Neurological: Negative for dizziness, light-headedness and headaches.       Objective:   Physical Exam  Vitals reviewed. Constitutional: He is oriented to person, place, and time. He appears well-developed and well-nourished.  HENT:  Head: Normocephalic and atraumatic.  Eyes: EOM are normal. Pupils are equal, round, and reactive to light.  Neck: No JVD present. Carotid bruit is not present.  Cardiovascular: Normal rate, regular rhythm and normal heart sounds.   No murmur heard. Pulmonary/Chest: Effort normal and breath sounds normal. He has no rales.  Musculoskeletal: He exhibits no edema.  Neurological: He is alert and oriented to person, place, and time.  Skin: Skin is warm and dry.  Psychiatric: He has a normal mood and affect.     Filed Vitals:   09/07/13 0840  BP: 132/76  Pulse: 85  Temp: 97.9 F (36.6 C)  TempSrc: Oral  Resp: 16  Height: 5\' 9"  (1.753 m)  Weight: 201 lb (91.173 kg)  SpO2: 96%        Assessment & Plan:   [name]. is a 57 y.o. male Unspecified essential hypertension - Plan: Comprehensive metabolic panel, metoprolol succinate (TOPROL-XL) 50 MG 24 hr tablet, hydrochlorothiazide (HYDRODIURIL) 25 MG tablet, amLODipine (NORVASC) 10 MG tablet, lisinopril (PRINIVIL,ZESTRIL) 40 MG tablet. Controlled. Hx of CVA. Refilled meds.   Hyperlipidemia - Plan: Lipid panel, pravastatin (PRAVACHOL) 40 MG tablet. Refilled. Check lipids. Goal LDL under 70? With hx of CVA.   Erectile dysfunction - Plan: Testosterone, free, total, sildenafil (VIAGRA) 100 MG tablet -1/2 to 1 prn, SED and CP precautions reviewed.   Foot pain, hx of gout, but sounds like plantar fasciitis - mild. Can try otc foot insert as planned, then rtc if not improving.   Will schedule for PCP and physical.    Meds ordered this encounter  Medications  . pravastatin  (PRAVACHOL) 40 MG tablet    Sig: Take 1 tablet (40 mg total) by mouth daily.    Dispense:  90 tablet    Refill:  1  . metoprolol succinate (TOPROL-XL) 50 MG 24 hr tablet    Sig: Take 1 tablet (50 mg total) by mouth daily. Take with or immediately following a meal.    Dispense:  90 tablet    Refill:  1  . hydrochlorothiazide (HYDRODIURIL) 25 MG tablet    Sig: Take 1 tablet (25 mg total) by  mouth daily.    Dispense:  90 tablet    Refill:  1  . amLODipine (NORVASC) 10 MG tablet    Sig: Take 1 tablet (10 mg total) by mouth daily.    Dispense:  15 tablet    Refill:  0  . lisinopril (PRINIVIL,ZESTRIL) 40 MG tablet    Sig: Take 1 tablet (40 mg total) by mouth daily.    Dispense:  90 tablet    Refill:  1  . sildenafil (VIAGRA) 100 MG tablet    Sig: Take 0.5-1 tablets (50-100 mg total) by mouth daily as needed for erectile dysfunction.    Dispense:  5 tablet    Refill:  11   Patient Instructions  Keep a record of your blood pressures outside of the office and bring them to the next office visit. We will call you to schedule a physical.  You should receive a call or letter about your lab results within the next week to 10 days.  If needed, we can recheck these labs next time after completely fasting for 8 hours.  Return to the clinic or go to the nearest emergency room if any of your symptoms worsen or new symptoms occur.         I personally performed the services described in this documentation, which was scribed in my presence. The recorded information has been reviewed and considered, and addended by me as needed.

## 2013-09-07 NOTE — Patient Instructions (Addendum)
Keep a record of your blood pressures outside of the office and bring them to the next office visit. We will call you to schedule a physical.  You should receive a call or letter about your lab results within the next week to 10 days.  If needed, we can recheck these labs next time after completely fasting for 8 hours.  Return to the clinic or go to the nearest emergency room if any of your symptoms worsen or new symptoms occur.

## 2013-09-09 LAB — TESTOSTERONE, FREE, TOTAL, SHBG: Sex Hormone Binding: 30 nmol/L (ref 13–71)

## 2013-09-09 NOTE — Progress Notes (Signed)
Left message for patient to call back regarding scheduling a cpe with a new primary physician.

## 2013-09-16 ENCOUNTER — Encounter: Payer: Self-pay | Admitting: *Deleted

## 2013-10-19 ENCOUNTER — Ambulatory Visit: Payer: BC Managed Care – PPO

## 2013-10-19 ENCOUNTER — Ambulatory Visit (INDEPENDENT_AMBULATORY_CARE_PROVIDER_SITE_OTHER): Payer: BC Managed Care – PPO | Admitting: Family Medicine

## 2013-10-19 VITALS — BP 120/72 | HR 81 | Temp 98.8°F | Resp 18 | Ht 69.0 in | Wt 198.0 lb

## 2013-10-19 DIAGNOSIS — R51 Headache: Secondary | ICD-10-CM

## 2013-10-19 DIAGNOSIS — R059 Cough, unspecified: Secondary | ICD-10-CM

## 2013-10-19 DIAGNOSIS — R05 Cough: Secondary | ICD-10-CM

## 2013-10-19 DIAGNOSIS — J111 Influenza due to unidentified influenza virus with other respiratory manifestations: Secondary | ICD-10-CM

## 2013-10-19 LAB — POCT CBC
Granulocyte percent: 48.4 %G (ref 37–80)
HCT, POC: 48.7 % (ref 43.5–53.7)
Hemoglobin: 15.2 g/dL (ref 14.1–18.1)
Lymph, poc: 1.8 (ref 0.6–3.4)
MCH, POC: 24.2 pg — AB (ref 27–31.2)
MCHC: 31.2 g/dL — AB (ref 31.8–35.4)
MCV: 77.6 fL — AB (ref 80–97)
MID (cbc): 0.4 (ref 0–0.9)
MPV: 10.5 fL (ref 0–99.8)
POC Granulocyte: 2 (ref 2–6.9)
POC LYMPH PERCENT: 42.1 %L (ref 10–50)
POC MID %: 9.5 %M (ref 0–12)
Platelet Count, POC: 207 10*3/uL (ref 142–424)
RBC: 6.28 M/uL — AB (ref 4.69–6.13)
RDW, POC: 16.2 %
WBC: 4.2 10*3/uL — AB (ref 4.6–10.2)

## 2013-10-19 LAB — POCT INFLUENZA A/B
Influenza A, POC: NEGATIVE
Influenza B, POC: NEGATIVE

## 2013-10-19 MED ORDER — HYDROCOD POLST-CHLORPHEN POLST 10-8 MG/5ML PO LQCR: 5.0000 mL | Freq: Two times a day (BID) | ORAL | Status: DC | PRN

## 2013-10-19 MED ORDER — OSELTAMIVIR PHOSPHATE 75 MG PO CAPS: 75.0000 mg | ORAL_CAPSULE | Freq: Two times a day (BID) | ORAL | Status: DC

## 2013-10-19 NOTE — Patient Instructions (Signed)

## 2013-10-19 NOTE — Progress Notes (Signed)
Patient Name: Ruben Camacho. Date of Birth: Apr 11, 1956 Medical Record Number: [id number] Gender: male Date of Encounter: 10/19/2013  Chief Complaint: Generalized Body Aches, Headache, Cough, Fever and Chills   History of Present Illness:  Ruben Camacho. is a 57 y.o. very pleasant male with a h/o HTN, gout, and hyperlipidemia patient who presents with the following:  Pt presents to [facility] c/o gradual onset, gradually worsening generalized body aches that initially started 3 days ago. He also reports HA, a dry cough, fever, and chills. He states when he coughs it is typically constant. He denies trying anything OTC for his current symptoms. He reports experiencing multiple episodes of diarrhea and vomiting last week which have both resolved.  He denies being a smoker. Pt is typically around people due to the nature of his work, but denies any known sick contacts.  Pt works for [employer]  Patient Active Problem List   Diagnosis Date Noted   Hypertension 11/30/2011   Hyperlipemia 11/30/2011   Gout 11/30/2011   Beta thalassemia 11/30/2011   Past Medical History  Diagnosis Date   Hypertension    Hyperlipidemia    Stroke 07/01/2012    L sided weakness, slurred speech.  Admission [facility].     Gout     1-2 exacerbations per year.  Takes Advil PRN for pain.   Past Surgical History  Procedure Laterality Date   Hernia repair     Admission  07/01/2012    CVA (L sided weakness, slurred speech).  [facility].   Colonoscopy  10/31/2008    no polyps.  Repeat in 10 years.  [place].   History  Substance Use Topics   Smoking status: Never Smoker    Smokeless tobacco: Not on file   Alcohol Use: No     Comment: former drinker   Family History  Problem Relation Age of Onset   Stroke Mother    Hypertension Mother    Hypertension Daughter    Cancer Maternal Grandmother     lung   No Known Allergies  Medication list has been reviewed and  updated.  Current Outpatient Prescriptions on File Prior to Visit  Medication Sig Dispense Refill   amLODipine (NORVASC) 10 MG tablet Take 1 tablet (10 mg total) by mouth daily.  15 tablet  0   aspirin 325 MG tablet Take 325 mg by mouth daily.       hydrochlorothiazide (HYDRODIURIL) 25 MG tablet Take 1 tablet (25 mg total) by mouth daily.  90 tablet  1   lisinopril (PRINIVIL,ZESTRIL) 40 MG tablet Take 1 tablet (40 mg total) by mouth daily.  90 tablet  1   metoprolol succinate (TOPROL-XL) 50 MG 24 hr tablet Take 1 tablet (50 mg total) by mouth daily. Take with or immediately following a meal.  90 tablet  1   pravastatin (PRAVACHOL) 40 MG tablet Take 1 tablet (40 mg total) by mouth daily.  90 tablet  1   sildenafil (VIAGRA) 100 MG tablet Take 0.5-1 tablets (50-100 mg total) by mouth daily as needed for erectile dysfunction.  5 tablet  11   No current facility-administered medications on file prior to visit.    Review of Systems:    Physical Examination: Filed Vitals:   10/19/13 0822  BP: 120/72  Pulse: 81  Temp: 98.8 F (37.1 C)  Resp: 18   @vitals2 @ Body mass index is 29.23 kg/(m^2). Ideal Body Weight: @FLOWAMB [id number] Patient appears to be acutely ill although  is alert and follows commands appropriately. He's lying down when I came in the room but he was able to get up and me examine him without problem. HEENT: Mild erythema and throat, otherwise negative Neck: Supple no adenopathy Chest: Clear, violent cough Heart: Regular no murmur Neuro: Moving 4 she weighs equally no obvious weakness focally Skin: Without rash  EKG / Labs / Xrays: [facility] reading (PRIMARY) by  Dr. Joseph Art:  Persistently elevated right hemidiaphragm Results for orders placed in visit on 10/19/13  POCT CBC      Result Value Range   WBC 4.2 (*) 4.6 - 10.2 K/uL   Lymph, poc 1.8  0.6 - 3.4   POC LYMPH PERCENT 42.1  10 - 50 %L   MID (cbc) 0.4  0 - 0.9   POC MID % 9.5  0 - 12 %M   POC  Granulocyte 2.0  2 - 6.9   Granulocyte percent 48.4  37 - 80 %G   RBC 6.28 (*) 4.69 - 6.13 M/uL   Hemoglobin 15.2  14.1 - 18.1 g/dL   HCT, POC 48.7  43.5 - 53.7 %   MCV 77.6 (*) 80 - 97 fL   MCH, POC 24.2 (*) 27 - 31.2 pg   MCHC 31.2 (*) 31.8 - 35.4 g/dL   RDW, POC 16.2     Platelet Count, POC 207  142 - 424 K/uL   MPV 10.5  0 - 99.8 fL    Assessment and Plan:  Cough - Plan: POCT Influenza A/B, POCT CBC, DG Chest 2 View, chlorpheniramine-HYDROcodone (TUSSIONEX PENNKINETIC ER) 10-8 MG/5ML LQCR  [id number]) - Plan: POCT Influenza A/B, POCT CBC, DG Chest 2 View  Influenza - Plan: oseltamivir (TAMIFLU) 75 MG capsule  Signed, Robyn Haber, MD

## 2013-11-07 ENCOUNTER — Ambulatory Visit (INDEPENDENT_AMBULATORY_CARE_PROVIDER_SITE_OTHER): Payer: BC Managed Care – PPO | Admitting: Family Medicine

## 2013-11-07 VITALS — BP 152/92 | HR 76 | Temp 98.9°F | Resp 18 | Wt 207.0 lb

## 2013-11-07 DIAGNOSIS — J029 Acute pharyngitis, unspecified: Secondary | ICD-10-CM

## 2013-11-07 DIAGNOSIS — R05 Cough: Secondary | ICD-10-CM

## 2013-11-07 DIAGNOSIS — J040 Acute laryngitis: Secondary | ICD-10-CM

## 2013-11-07 DIAGNOSIS — R058 Other specified cough: Secondary | ICD-10-CM

## 2013-11-07 LAB — POCT RAPID STREP A (OFFICE): RAPID STREP A SCREEN: NEGATIVE

## 2013-11-07 MED ORDER — MAGIC MOUTHWASH W/LIDOCAINE: 10.0000 mL | ORAL | Status: DC | PRN

## 2013-11-07 NOTE — Patient Instructions (Signed)
Viral Pharyngitis Viral pharyngitis is a viral infection that produces redness, pain, and swelling (inflammation) of the throat. It can spread from person to person (contagious). CAUSES Viral pharyngitis is caused by inhaling a large amount of certain germs called viruses. Many different viruses cause viral pharyngitis. SYMPTOMS Symptoms of viral pharyngitis include:  Sore throat.  Tiredness.  Stuffy nose.  Low-grade fever.  Congestion.  Cough. TREATMENT Treatment includes rest, drinking plenty of fluids, and the use of over-the-counter medication (approved by your caregiver). HOME CARE INSTRUCTIONS   Drink enough fluids to keep your urine clear or pale yellow.  Eat soft, cold foods such as ice cream, frozen ice pops, or gelatin dessert.  Gargle with warm salt water (1 tsp salt per 1 qt of water).  If over age 7, throat lozenges may be used safely.  Only take over-the-counter or prescription medicines for pain, discomfort, or fever as directed by your caregiver. Do not take aspirin. To help prevent spreading viral pharyngitis to others, avoid:  Mouth-to-mouth contact with others.  Sharing utensils for eating and drinking.  Coughing around others. SEEK MEDICAL CARE IF:   You are better in a few days, then become worse.  You have a fever or pain not helped by pain medicines.  There are any other changes that concern you. Document Released: 07/27/2005 Document Revised: 01/09/2012 Document Reviewed: 12/23/2010 [employer] Patient Information 2014 [employer].  

## 2013-11-07 NOTE — Progress Notes (Signed)
Subjective: Patient had the flu a couple of weeks ago. He was doing better from that but still had some cough and has been taking some cough medicine. Tuesday he began getting a sore throat and laryngitis. He has not had any fever. Some headache. No runny nose. Mild cough still. He does not smoke.  Objective: Pleasant gentleman who is worse. His TMs are normal though partially occluded by cerumen. Throat erythematous without exudate. Neck was supple without significant nodes. Chest clear to auscultation. Heart regular without murmurs.  Assessment: Pharyngitis Recent influenza with post flu cough  Plan: Strep screen  Results for orders placed in visit on 11/07/13  POCT RAPID STREP A (OFFICE)      Result Value Range   Rapid Strep A Screen Negative  Negative   Viral pharyngitis and laryngitis. 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 We'll go ahead and give him an excuse for tomorrow which will allow him the weekend to let this pass back. Drink plenty of fluids. Return if significant fever or purulent drainage

## 2013-11-09 LAB — CULTURE, GROUP A STREP: ORGANISM ID, BACTERIA: NORMAL

## 2013-11-11 ENCOUNTER — Ambulatory Visit (INDEPENDENT_AMBULATORY_CARE_PROVIDER_SITE_OTHER): Payer: BC Managed Care – PPO | Admitting: Internal Medicine

## 2013-11-11 VITALS — BP 126/78 | HR 90 | Temp 99.0°F | Resp 17 | Ht 70.0 in | Wt 200.0 lb

## 2013-11-11 DIAGNOSIS — R05 Cough: Secondary | ICD-10-CM

## 2013-11-11 DIAGNOSIS — J040 Acute laryngitis: Secondary | ICD-10-CM

## 2013-11-11 DIAGNOSIS — R059 Cough, unspecified: Secondary | ICD-10-CM

## 2013-11-11 LAB — POCT CBC
Granulocyte percent: 64 %G (ref 37–80)
HEMATOCRIT: 51.5 % (ref 43.5–53.7)
Hemoglobin: 16.2 g/dL (ref 14.1–18.1)
Lymph, poc: 1.7 (ref 0.6–3.4)
MCH: 24.5 pg — AB (ref 27–31.2)
MCHC: 31.5 g/dL — AB (ref 31.8–35.4)
MCV: 77.8 fL — AB (ref 80–97)
MID (CBC): 0.4 (ref 0–0.9)
MPV: 9.5 fL (ref 0–99.8)
PLATELET COUNT, POC: 262 10*3/uL (ref 142–424)
POC GRANULOCYTE: 3.7 (ref 2–6.9)
POC LYMPH PERCENT: 29.4 %L (ref 10–50)
POC MID %: 6.6 %M (ref 0–12)
RBC: 6.62 M/uL — AB (ref 4.69–6.13)
RDW, POC: 16.7 %
WBC: 5.8 10*3/uL (ref 4.6–10.2)

## 2013-11-11 MED ORDER — AMOXICILLIN 500 MG PO CAPS
1000.0000 mg | ORAL_CAPSULE | Freq: Two times a day (BID) | ORAL | Status: DC
Start: 2013-11-11 — End: 2014-01-25

## 2013-11-11 MED ORDER — HYDROCODONE-ACETAMINOPHEN 7.5-325 MG/15ML PO SOLN: 5.0000 mL | Freq: Four times a day (QID) | ORAL | Status: DC | PRN

## 2013-11-11 MED ORDER — PREDNISONE 10 MG PO TABS: ORAL_TABLET | ORAL | Status: DC

## 2013-11-11 NOTE — Patient Instructions (Signed)
Laryngitis At the top of your windpipe is your voice box. It is the source of your voice. Inside your voice box are 2 bands of muscles called vocal cords. When you breathe, your vocal cords are relaxed and open so that air can get into the lungs. When you decide to say something, these cords come together and vibrate. The sound from these vibrations goes into your throat and comes out through your mouth as sound. Laryngitis is an inflammation of the vocal cords that causes hoarseness, cough, loss of voice, sore throat, and dry throat. Laryngitis can be temporary (acute) or long-term (chronic). Most cases of acute laryngitis improve with time.Chronic laryngitis lasts for more than 3 weeks. CAUSES Laryngitis can often be related to excessive smoking, talking, or yelling, as well as inhalation of toxic fumes and allergies. Acute laryngitis is usually caused by a viral infection, vocal strain, measles or mumps, or bacterial infections. Chronic laryngitis is usually caused by vocal cord strain, vocal cord injury, postnasal drip, growths on the vocal cords, or acid reflux. SYMPTOMS   Cough.  Sore throat.  Dry throat. RISK FACTORS  Respiratory infections.  Exposure to irritating substances, such as cigarette smoke, excessive amounts of alcohol, stomach acids, and workplace chemicals.  Voice trauma, such as vocal cord injury from shouting or speaking too loud. DIAGNOSIS  Your cargiver will perform a physical exam. During the physical exam, your caregiver will examine your throat. The most common sign of laryngitis is hoarseness. Laryngoscopy may be necessary to confirm the diagnosis of this condition. This procedure allows your caregiver to look into the larynx. HOME CARE INSTRUCTIONS  Drink enough fluids to keep your urine clear or pale yellow.  Rest until you no longer have symptoms or as directed by your caregiver.  Breathe in moist air.  Take all medicine as directed by your  caregiver.  Do not smoke.  Talk as little as possible (this includes whispering).  Write on paper instead of talking until your voice is back to normal.  Follow up with your caregiver if your condition has not improved after 10 days. SEEK MEDICAL CARE IF:   You have trouble breathing.  You cough up blood.  You have persistent fever.  You have increasing pain.  You have difficulty swallowing. MAKE SURE YOU:  Understand these instructions.  Will watch your condition.  Will get help right away if you are not doing well or get worse. Document Released: 10/17/2005 Document Revised: 01/09/2012 Document Reviewed: 12/23/2010 [facility] Patient Information 2014 [place], [place].

## 2013-11-11 NOTE — Progress Notes (Signed)
   Subjective:    Patient ID: Ruben Appl., male    DOB: 1956/04/29, 58 y.o.   MRN: [id number]  HPI 58 y.o. Male presents to clinic with sore throat, cough and headache that started last Thursday. Symptoms have gotten worse since last seen on 11/07/13. Has been using throat spray and lozenges prescribed but states that only provided temporary relief. Has some vomiting and nausea this morning. Denies any body aches with current symptoms. Had flu 4 weeks ago, but states that symptoms are now in his throat which is the worst symptom. 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 Had negative strep test at last visit on 11/07/13.   Review of Systems     Objective:   Physical Exam  Constitutional: He appears well-developed and well-nourished. He is cooperative.  Non-toxic appearance. He appears ill. No distress.  HENT:  Head: Normocephalic.  Right Ear: External ear normal.  Left Ear: External ear normal.  Nose: Nose normal.  Mouth/Throat: Posterior oropharyngeal edema and posterior oropharyngeal erythema present.  Neck: Normal range of motion. Neck supple.  Cardiovascular: Normal rate.   Pulmonary/Chest: Effort normal.  Musculoskeletal: Normal range of motion.  Neurological: He is alert.  Skin: No rash noted.  Psychiatric: He has a normal mood and affect.   3+ hoarsenes  Results for orders placed in visit on 11/11/13  POCT CBC      Result Value Range   WBC 5.8  4.6 - 10.2 K/uL   Lymph, poc 1.7  0.6 - 3.4   POC LYMPH PERCENT 29.4  10 - 50 %L   MID (cbc) 0.4  0 - 0.9   POC MID % 6.6  0 - 12 %M   POC Granulocyte 3.7  2 - 6.9   Granulocyte percent 64.0  37 - 80 %G   RBC 6.62 (*) 4.69 - 6.13 M/uL   Hemoglobin 16.2  14.1 - 18.1 g/dL   HCT, POC 51.5  43.5 - 53.7 %   MCV 77.8 (*) 80 - 97 fL   MCH, POC 24.5 (*) 27 - 31.2 pg   MCHC 31.5 (*) 31.8 - 35.4 g/dL   RDW, POC 16.7     Platelet Count, POC 262  142 - 424 K/uL   MPV 9.5  0 - 99.8 fL          Assessment & Plan:  Laryngitis/Fever/Cough Lortab/Amoxil/Prednisone

## 2014-01-25 ENCOUNTER — Ambulatory Visit (INDEPENDENT_AMBULATORY_CARE_PROVIDER_SITE_OTHER): Payer: BC Managed Care – PPO | Admitting: Internal Medicine

## 2014-01-25 ENCOUNTER — Ambulatory Visit: Payer: BC Managed Care – PPO

## 2014-01-25 VITALS — BP 122/80 | HR 64 | Temp 98.7°F | Resp 16 | Ht 70.0 in | Wt 204.2 lb

## 2014-01-25 DIAGNOSIS — M25579 Pain in unspecified ankle and joints of unspecified foot: Secondary | ICD-10-CM

## 2014-01-25 MED ORDER — MELOXICAM 15 MG PO TABS: 15.0000 mg | ORAL_TABLET | Freq: Every day | ORAL | Status: DC

## 2014-01-25 NOTE — Progress Notes (Signed)
   Subjective:    Patient ID: Ruben Appl., male    DOB: 03-Feb-1956, 58 y.o.   MRN: [id number]  HPI 58 year old male presents for evaluation of left heel and foot pain x 4 days. States it started acutely and has gotten slightly worse since onset. Has now hit a plateau and pain is not worsening but also does not seem to be getting any better.  He has taken ibuprofen 400 mg bid which does relieve the pain but as soon as the medication wears off the pain returns. No hx of similar problems. Admits he did have ankle pain about 3 months ago and started wearing an arch supports which has helped.  No hx of prior ankle/foot fractures.  Denies paresthesias or weakness. Does have pain that is radiating down his foot secondary to gait change.  Patient is otherwise doing well with no other concerns today.     Review of Systems  Musculoskeletal: Positive for gait problem (pain with ambulating).  Skin: Negative for color change.  Neurological: Negative for weakness and numbness.       Objective:   Physical Exam  Constitutional: He is oriented to person, place, and time. He appears well-developed and well-nourished.  HENT:  Head: Normocephalic and atraumatic.  Right Ear: External ear normal.  Left Ear: External ear normal.  Eyes: Conjunctivae are normal.  Neck: Normal range of motion.  Cardiovascular: Normal rate.   Pulmonary/Chest: Effort normal.  Neurological: He is alert and oriented to person, place, and time.  Psychiatric: He has a normal mood and affect. His behavior is normal. Judgment and thought content normal.    [facility] reading (PRIMARY) by  Dr. Laney Pastor as no acute bony abnormality.       Assessment & Plan:   Pain in joint, ankle and foot - Plan: DG Foot Complete Left, meloxicam (MOBIC) 15 MG tablet  Heel pain, negative x-ray so likely tendinitis vs bone bruise.   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  If no improvement in 7-10 days will refer to Podiatry for evaluation and treatment, sooner if worse.   I have completed the patient encounter in its entirety as documented by [facility], with editing by me where necessary. Robert P. Laney Pastor, M.D.

## 2014-02-21 ENCOUNTER — Other Ambulatory Visit: Payer: Self-pay | Admitting: Physician Assistant

## 2014-02-21 NOTE — Telephone Encounter (Signed)
Nira Conn, it looks like you were the primary provider at [facility]? Do you want to give RFs on Mobic or have pt RTC?

## 2014-03-11 ENCOUNTER — Other Ambulatory Visit: Payer: Self-pay | Admitting: Family Medicine

## 2014-03-13 ENCOUNTER — Ambulatory Visit: Payer: BC Managed Care – PPO

## 2014-03-13 ENCOUNTER — Ambulatory Visit (INDEPENDENT_AMBULATORY_CARE_PROVIDER_SITE_OTHER): Payer: BC Managed Care – PPO | Admitting: Physician Assistant

## 2014-03-13 VITALS — BP 148/92 | HR 82 | Temp 99.0°F | Resp 18 | Ht 68.5 in | Wt 205.6 lb

## 2014-03-13 DIAGNOSIS — R0602 Shortness of breath: Secondary | ICD-10-CM

## 2014-03-13 DIAGNOSIS — R059 Cough, unspecified: Secondary | ICD-10-CM

## 2014-03-13 DIAGNOSIS — R05 Cough: Secondary | ICD-10-CM

## 2014-03-13 LAB — POCT CBC
GRANULOCYTE PERCENT: 51 % (ref 37–80)
HCT, POC: 44.4 % (ref 43.5–53.7)
HEMOGLOBIN: 14.3 g/dL (ref 14.1–18.1)
LYMPH, POC: 1.4 (ref 0.6–3.4)
MCH: 23.8 pg — AB (ref 27–31.2)
MCHC: 32.2 g/dL (ref 31.8–35.4)
MCV: 74 fL — AB (ref 80–97)
MID (cbc): 0.4 (ref 0–0.9)
MPV: 10 fL (ref 0–99.8)
PLATELET COUNT, POC: 182 10*3/uL (ref 142–424)
POC GRANULOCYTE: 1.9 — AB (ref 2–6.9)
POC LYMPH PERCENT: 37.8 %L (ref 10–50)
POC MID %: 11.2 %M (ref 0–12)
RBC: 6 M/uL (ref 4.69–6.13)
RDW, POC: 15.9 %
WBC: 3.8 10*3/uL — AB (ref 4.6–10.2)

## 2014-03-13 MED ORDER — ALBUTEROL SULFATE (2.5 MG/3ML) 0.083% IN NEBU
2.5000 mg | INHALATION_SOLUTION | Freq: Once | RESPIRATORY_TRACT | Status: AC
  Administered 2014-03-13: 2.5 mg via RESPIRATORY_TRACT

## 2014-03-13 MED ORDER — IPRATROPIUM BROMIDE 0.02 % IN SOLN
0.5000 mg | Freq: Once | RESPIRATORY_TRACT | Status: AC
  Administered 2014-03-13: 0.5 mg via RESPIRATORY_TRACT

## 2014-03-13 MED ORDER — PREDNISONE 20 MG PO TABS: ORAL_TABLET | ORAL | Status: DC

## 2014-03-13 MED ORDER — ALBUTEROL SULFATE HFA 108 (90 BASE) MCG/ACT IN AERS: 2.0000 | INHALATION_SPRAY | RESPIRATORY_TRACT | Status: DC | PRN

## 2014-03-13 NOTE — Progress Notes (Signed)
I have examined this patient along with the student and agree.  

## 2014-03-13 NOTE — Patient Instructions (Signed)
Get plenty of rest and drink at least 64oz of water each day  If symptoms worsen, return for further evaluation and treatment  We think this was triggered by allergies however due to your SOB and no history of SOB or lung conditions, we will treat this with prednisone dose pack to reduce the inflammation causing your symptoms.

## 2014-03-13 NOTE — Progress Notes (Signed)
Subjective:    Patient ID: Ruben Appl., male    DOB: 11/13/1955, 58 y.o.   MRN: [id number]  HPI  58y.o male with hyperlipidemia and hypertension presents with SOB for past two days with associated cough, HA, congestion.  He describes a coughing attack that forced him to vomit at the onset of symptoms, but denies nausea otherwise.  Cough is productive of yellow sputum.  Has never had SOB like this before and has no hx of asthma or other lung conditions.  Denies tobacco use, chest pain, leg swelling, fever, diarrhea.  Denies sick contacts, recent change in medications, recent travel, and is not usually bothered by seasonal allergies.   Review of Systems  Constitutional: Negative for fever, chills and fatigue.  HENT: Positive for congestion and ear pain. Negative for sinus pressure.   Eyes: Negative.   Respiratory: Positive for cough, shortness of breath and wheezing.   Cardiovascular: Negative for chest pain, palpitations and leg swelling.  Gastrointestinal: Negative for nausea, vomiting, abdominal pain, diarrhea and abdominal distention.  Endocrine: Negative.   Genitourinary: Negative.   Musculoskeletal: Negative for arthralgias and myalgias.  Skin: Negative for rash.  Allergic/Immunologic: Negative.   Neurological: Positive for headaches. Negative for dizziness, weakness and numbness.       Objective:   Physical Exam  Constitutional: He is oriented to person, place, and time. He appears well-developed and well-nourished. No distress.  Audible wheezing BP 148/92  Pulse 82  Temp(Src) 99 F (37.2 C) (Oral)  Resp 18  Ht 5' 8.5" (1.74 m)  Wt 205 lb 9.6 oz (93.26 kg)  BMI 30.80 kg/m2  SpO2 96%   HENT:  Head: Normocephalic.  Right Ear: Tympanic membrane and ear canal normal.  Left Ear: Tympanic membrane and ear canal normal.  Nose: Nose normal.  Mouth/Throat: Uvula is midline and oropharynx is clear and moist. No oropharyngeal exudate.  Eyes: Conjunctivae are normal.  Pupils are equal, round, and reactive to light.  Neck: Normal range of motion. No JVD present.  Cardiovascular: Normal rate, regular rhythm and intact distal pulses.  Exam reveals no gallop and no friction rub.   No murmur heard. Pulmonary/Chest: Effort normal. No respiratory distress. Wheezes: what initially appear to be wheezes are actually upper airway sounds and resolve completely during examination, before neb treatment. He exhibits no tenderness.  Musculoskeletal: He exhibits no edema.  Lymphadenopathy:    He has no cervical adenopathy.  Neurological: He is alert and oriented to person, place, and time.  Skin: Skin is warm and dry. No rash noted.  Psychiatric: He has a normal mood and affect. His behavior is normal.    CXR reviewed by Dr. Brigitte Pulse:  No acute infiltrates Nebulized albuterol and ipratropium treatment given in office: post treatment breath sounds louder in all fields; no wheezing heard  Results for orders placed in visit on 03/13/14  POCT CBC      Result Value Ref Range   WBC 3.8 (*) 4.6 - 10.2 K/uL   Lymph, poc 1.4  0.6 - 3.4   POC LYMPH PERCENT 37.8  10 - 50 %L   MID (cbc) 0.4  0 - 0.9   POC MID % 11.2  0 - 12 %M   POC Granulocyte 1.9 (*) 2 - 6.9   Granulocyte percent 51.0  37 - 80 %G   RBC 6.00  4.69 - 6.13 M/uL   Hemoglobin 14.3  14.1 - 18.1 g/dL   HCT, POC 44.4  43.5 - 53.7 %  MCV 74.0 (*) 80 - 97 fL   MCH, POC 23.8 (*) 27 - 31.2 pg   MCHC 32.2  31.8 - 35.4 g/dL   RDW, POC 15.9     Platelet Count, POC 182  142 - 424 K/uL   MPV 10.0  0 - 99.8 fL        Assessment & Plan:   1. Cough 2. SOB (shortness of breath) CXR shows no acute infiltrates.  Prescribed rescue albuterol inhaler due to improvement seen with nebulized treatment in office.  Symptoms probably triggered by allergies.  Pt will return if symptoms worsen. - POCT CBC - DG Chest 2 View; Future - albuterol (PROVENTIL) (2.5 MG/3ML) 0.083% nebulizer solution 2.5 mg; Take 3 mLs (2.5 mg total) by  nebulization once. - ipratropium (ATROVENT) nebulizer solution 0.5 mg; Take 2.5 mLs (0.5 mg total) by nebulization once. - predniSONE (DELTASONE) 20 MG tablet; Take 3 PO QAM x3days, 2 PO QAM x3days, 1 PO QAM x3days  Dispense: 18 tablet; Refill: 0 - albuterol (PROVENTIL HFA;VENTOLIN HFA) 108 (90 BASE) MCG/ACT inhaler; Inhale 2 puffs into the lungs every 4 (four) hours as needed for wheezing or shortness of breath (cough, shortness of breath or wheezing.).  Dispense: 1 Inhaler; Refill: 1

## 2014-03-21 ENCOUNTER — Other Ambulatory Visit: Payer: Self-pay | Admitting: Family Medicine

## 2014-03-26 ENCOUNTER — Other Ambulatory Visit: Payer: Self-pay | Admitting: Physician Assistant

## 2014-04-19 ENCOUNTER — Telehealth: Payer: Self-pay

## 2014-04-19 ENCOUNTER — Ambulatory Visit (INDEPENDENT_AMBULATORY_CARE_PROVIDER_SITE_OTHER): Payer: BC Managed Care – PPO | Admitting: Family Medicine

## 2014-04-19 VITALS — BP 136/80 | HR 74 | Temp 98.0°F | Ht 69.0 in | Wt 202.6 lb

## 2014-04-19 DIAGNOSIS — I10 Essential (primary) hypertension: Secondary | ICD-10-CM

## 2014-04-19 DIAGNOSIS — L03111 Cellulitis of right axilla: Secondary | ICD-10-CM

## 2014-04-19 DIAGNOSIS — E785 Hyperlipidemia, unspecified: Secondary | ICD-10-CM

## 2014-04-19 DIAGNOSIS — IMO0002 Reserved for concepts with insufficient information to code with codable children: Secondary | ICD-10-CM

## 2014-04-19 DIAGNOSIS — M109 Gout, unspecified: Secondary | ICD-10-CM

## 2014-04-19 DIAGNOSIS — Z79899 Other long term (current) drug therapy: Secondary | ICD-10-CM

## 2014-04-19 LAB — POCT CBC
Granulocyte percent: 69 %G (ref 37–80)
HCT, POC: 37.6 % — AB (ref 43.5–53.7)
Hemoglobin: 11.5 g/dL — AB (ref 14.1–18.1)
LYMPH, POC: 1.1 (ref 0.6–3.4)
MCH: 22.6 pg — AB (ref 27–31.2)
MCHC: 30.6 g/dL — AB (ref 31.8–35.4)
MCV: 73.9 fL — AB (ref 80–97)
MID (CBC): 0.3 (ref 0–0.9)
MPV: 10.3 fL (ref 0–99.8)
PLATELET COUNT, POC: 161 10*3/uL (ref 142–424)
POC Granulocyte: 3.2 (ref 2–6.9)
POC LYMPH %: 24.7 % (ref 10–50)
POC MID %: 6.3 % (ref 0–12)
RBC: 5.09 M/uL (ref 4.69–6.13)
RDW, POC: 15.2 %
WBC: 4.6 10*3/uL (ref 4.6–10.2)

## 2014-04-19 LAB — LIPID PANEL
CHOL/HDL RATIO: 3.6 ratio
CHOLESTEROL: 179 mg/dL (ref 0–200)
HDL: 50 mg/dL (ref >39)
LDL Cholesterol: 102 mg/dL — ABNORMAL HIGH (ref 0–99)
Triglycerides: 133 mg/dL (ref <150)
VLDL: 27 mg/dL (ref 0–40)

## 2014-04-19 LAB — COMPREHENSIVE METABOLIC PANEL
ALT: 24 U/L (ref 0–53)
AST: 30 U/L (ref 0–37)
Albumin: 4.6 g/dL (ref 3.5–5.2)
Alkaline Phosphatase: 62 U/L (ref 39–117)
BILIRUBIN TOTAL: 0.6 mg/dL (ref 0.2–1.2)
BUN: 17 mg/dL (ref 6–23)
CHLORIDE: 99 meq/L (ref 96–112)
CO2: 26 mEq/L (ref 19–32)
CREATININE: 1.22 mg/dL (ref 0.50–1.35)
Calcium: 9.7 mg/dL (ref 8.4–10.5)
Glucose, Bld: 89 mg/dL (ref 70–99)
Potassium: 4.6 mEq/L (ref 3.5–5.3)
Sodium: 138 mEq/L (ref 135–145)
Total Protein: 8.1 g/dL (ref 6.0–8.3)

## 2014-04-19 LAB — URIC ACID: URIC ACID, SERUM: 7.5 mg/dL (ref 4.0–7.8)

## 2014-04-19 MED ORDER — PRAVASTATIN SODIUM 40 MG PO TABS: 40.0000 mg | ORAL_TABLET | Freq: Every day | ORAL | Status: DC

## 2014-04-19 MED ORDER — AMLODIPINE BESYLATE 10 MG PO TABS: 10.0000 mg | ORAL_TABLET | Freq: Every day | ORAL | Status: DC

## 2014-04-19 MED ORDER — INDOMETHACIN 50 MG PO CAPS: 50.0000 mg | ORAL_CAPSULE | Freq: Three times a day (TID) | ORAL | Status: DC

## 2014-04-19 MED ORDER — COLCHICINE 0.6 MG PO TABS: ORAL_TABLET | ORAL | Status: DC

## 2014-04-19 MED ORDER — LISINOPRIL 40 MG PO TABS: 40.0000 mg | ORAL_TABLET | Freq: Every day | ORAL | Status: DC

## 2014-04-19 MED ORDER — HYDROCHLOROTHIAZIDE 25 MG PO TABS: 25.0000 mg | ORAL_TABLET | Freq: Every day | ORAL | Status: DC

## 2014-04-19 MED ORDER — DOXYCYCLINE HYCLATE 100 MG PO CAPS: 100.0000 mg | ORAL_CAPSULE | Freq: Two times a day (BID) | ORAL | Status: DC

## 2014-04-19 MED ORDER — METOPROLOL SUCCINATE ER 50 MG PO TB24
50.0000 mg | ORAL_TABLET | Freq: Every day | ORAL | Status: DC
Start: 2014-04-19 — End: 2015-01-08

## 2014-04-19 NOTE — Telephone Encounter (Signed)
Patient wants some type of pain medication for gout. He was seen today by Dr. Brigitte Pulse. Please advise. He forgot to ask this while being seen today.   [phone]

## 2014-04-19 NOTE — Progress Notes (Deleted)
Subjective:   This chart was scribed for Delman Cheadle, MD, by Neta Ehlers, ED Scribe. This patient's care was started 9:48 AM.    Patient ID: Ruben Appl., male    DOB: April 20, 1956, 58 y.o.   MRN: [id number]  Chief Complaint  Patient presents with   Gout    to the right big toe x's 4 days   Recurrent Skin Infections    to the right axillary area x's 1 week    HPI  HPI Comments: Ruben Koel. is a 58 y.o. male with a h/o HTN and gout, who presents to the Emergency Department complaining of suspected gout to his right great toe which began four days ago. The pt states the pain radiates distally through the dorsum of his foot, and he reports the pain prevents him from normal ROM. He denies any known injury/trauma to his toe; he recently worked in his garage.   The pt reports a h/o intermittent gout, stating he normally experiences an episode of gout approximately once every six months, though he states he normally experiences gout to his left foot.   To address the pain, he has used Aleve twice a day as well as icing the toe. He has not used Meloxicam. He is unsure if he has been prescribed other medications for gout, such as Colchicine.   He states he checks his BP often at home, and that he normally measures in the 130s/80s; in the ED his BP is 136/80. He takes his BP medication as prescribed, though he states he needs a medication refill. The pt is fasting.   Ruben Camacho also reports a suspected abscess to his right axilla which he first noticed a week ago. He reports drainage from the site. He has applied warm compressed to the affected site. He denies a h/o abscesses.   He reports sleep disturbances due to the pain from both his toe and abscess.   Past Medical History  Diagnosis Date   Hypertension    Hyperlipidemia    Stroke 07/01/2012    L sided weakness, slurred speech.  Admission [facility].     Gout     1-2 exacerbations per year.  Takes Advil PRN  for pain.   Current Outpatient Prescriptions on File Prior to Visit  Medication Sig Dispense Refill   albuterol (PROVENTIL HFA;VENTOLIN HFA) 108 (90 BASE) MCG/ACT inhaler Inhale 2 puffs into the lungs every 4 (four) hours as needed for wheezing or shortness of breath (cough, shortness of breath or wheezing.).  1 Inhaler  1   amLODipine (NORVASC) 10 MG tablet Take 1 tablet (10 mg total) by mouth daily.  15 tablet  0   aspirin 325 MG tablet Take 325 mg by mouth daily.       hydrochlorothiazide (HYDRODIURIL) 25 MG tablet Take 1 tablet (25 mg total) by mouth daily. PATIENT NEEDS BLOOD PRESSURE CHECK UP FOR ADDITIONAL REFILLS  30 tablet  0   lisinopril (PRINIVIL,ZESTRIL) 40 MG tablet Take 1 tablet (40 mg total) by mouth daily.  90 tablet  1   meloxicam (MOBIC) 15 MG tablet TAKE 1 TABLET BY MOUTH EVERY DAY  30 tablet  0   metoprolol succinate (TOPROL-XL) 50 MG 24 hr tablet Take 1 tablet (50 mg total) by mouth daily. PATIENT NEEDS BLOOD PRESSURE CHECK UP FOR ADDITIONAL REFILLS  30 tablet  0   pravastatin (PRAVACHOL) 40 MG tablet Pt needs labs for further refills  30 tablet  0  predniSONE (DELTASONE) 20 MG tablet Take 3 PO QAM x3days, 2 PO QAM x3days, 1 PO QAM x3days  18 tablet  0   sildenafil (VIAGRA) 100 MG tablet Take 0.5-1 tablets (50-100 mg total) by mouth daily as needed for erectile dysfunction.  5 tablet  11   No current facility-administered medications on file prior to visit.    No Known Allergies   Review of Systems  Constitutional: Negative for fever, chills, activity change and appetite change.  Genitourinary: Positive for genital sores.  Musculoskeletal: Positive for myalgias.  Skin: Positive for color change.  Psychiatric/Behavioral: Positive for sleep disturbance.   Triage Vitals: BP 136/80   Pulse 74   Temp(Src) 98 F (36.7 C) (Oral)   Ht 5\' 9"  (1.753 m)   Wt 202 lb 9.6 oz (91.899 kg)   BMI 29.91 kg/m2   SpO2 97%     Objective:   Physical Exam  Nursing note and  vitals reviewed. Constitutional: He is oriented to person, place, and time. He appears well-developed and well-nourished. No distress.  HENT:  Head: Normocephalic and atraumatic.  Eyes: Conjunctivae and EOM are normal.  Neck: Neck supple. No tracheal deviation present.  Cardiovascular: Normal rate.   Pulmonary/Chest: Effort normal. No respiratory distress.  Musculoskeletal:  1st MT joint of right great toe: Mild blanching erythematous, mild swelling, mild warmth and very hyperesthetic.   Severely decreased passive ROM  Neurological: He is alert and oriented to person, place, and time.  Skin: Skin is warm and dry. There is erythema.  3 by 4 inch diameter with fluctuant central area of erythremic induration and drainage. Warm.   Psychiatric: He has a normal mood and affect. His behavior is normal.      Assessment & Plan:  Will prescribe an antibiotic for the pt's abscess.  Will also prescribe Colchicine.   Acute gout of right foot, unspecified cause - Plan: POCT CBC, Comprehensive metabolic panel, Lipid panel, Uric acid, CANCELED: POCT SEDIMENTATION RATE, CANCELED: Wound culture - use colcrys at first sign of gout flair - take 3 tabs today and refill given so pt can keep at home for next gout onset.  Gout flairs rarely so pt wants to stay on hctz for now (though may need to d/c if freq increases) and does not need prophylactic med like allopurinol at this point.  Start indomethacin as well and cont until gout flair completely resolved.  Cellulitis of axilla, right - Plan: POCT CBC, Comprehensive metabolic panel, Lipid panel, Uric acid, CANCELED: POCT SEDIMENTATION RATE, CANCELED: Wound culture - I&D by [facility] but wound clx not obtained as mainly just inflammation/induration. Cont warm compresses - not packed so RTC if worsens at all. Start doay  Essential hypertension, benign - Plan: POCT CBC, Comprehensive metabolic panel, Lipid panel, Uric acid, CANCELED: POCT SEDIMENTATION RATE, CANCELED:  Wound culture  Other and unspecified hyperlipidemia - Plan: POCT CBC, Comprehensive metabolic panel, Lipid panel, Uric acid, CANCELED: POCT SEDIMENTATION RATE, CANCELED: Wound culture  Encounter for long-term (current) use of other medications - Plan: POCT CBC, Comprehensive metabolic panel, Lipid panel, Uric acid, CANCELED: POCT SEDIMENTATION RATE, CANCELED: Wound culture  Unspecified essential hypertension - Plan: amLODipine (NORVASC) 10 MG tablet, lisinopril (PRINIVIL,ZESTRIL) 40 MG tablet - refilled at current doses.  Meds ordered this encounter  Medications   colchicine 0.6 MG tablet    Sig: Take 2 tabs po x 1 at first sign of gout onset then take 1 tab 1 hr later.    Dispense:  3 tablet  Refill:  2   doxycycline (VIBRAMYCIN) 100 MG capsule    Sig: Take 1 capsule (100 mg total) by mouth 2 (two) times daily.    Dispense:  20 capsule    Refill:  0   indomethacin (INDOCIN) 50 MG capsule    Sig: Take 1 capsule (50 mg total) by mouth 3 (three) times daily with meals.    Dispense:  30 capsule    Refill:  1   amLODipine (NORVASC) 10 MG tablet    Sig: Take 1 tablet (10 mg total) by mouth daily.    Dispense:  90 tablet    Refill:  1   hydrochlorothiazide (HYDRODIURIL) 25 MG tablet    Sig: Take 1 tablet (25 mg total) by mouth daily.    Dispense:  90 tablet    Refill:  1   lisinopril (PRINIVIL,ZESTRIL) 40 MG tablet    Sig: Take 1 tablet (40 mg total) by mouth daily.    Dispense:  90 tablet    Refill:  1   metoprolol succinate (TOPROL-XL) 50 MG 24 hr tablet    Sig: Take 1 tablet (50 mg total) by mouth daily.    Dispense:  90 tablet    Refill:  1   pravastatin (PRAVACHOL) 40 MG tablet    Sig: Take 1 tablet (40 mg total) by mouth daily. Pt needs labs for further refills    Dispense:  90 tablet    Refill:  1    I personally performed the services described in this documentation, which was scribed in my presence. The recorded information has been reviewed and considered,  and addended by me as needed.  Delman Cheadle, MD MPH

## 2014-04-19 NOTE — Progress Notes (Signed)
Procedure: Consent obtained.  Local anesthesia with 2% lido epi - Area opened with 1cm incision and the area was debrided - and packed with 1/4in plain packing - drsg placed.  Wound care d/w pt.

## 2014-04-21 NOTE — Telephone Encounter (Signed)
Patient would also like medication for Gout.

## 2014-04-22 ENCOUNTER — Telehealth: Payer: Self-pay

## 2014-04-22 NOTE — Telephone Encounter (Signed)
Patient called returning a missed call from [place]. Please return call and advise. Thank you.

## 2014-04-22 NOTE — Telephone Encounter (Signed)
And the gout medication was colcrys which was also sent to his pharmacy at the time of visit.

## 2014-04-22 NOTE — Telephone Encounter (Signed)
LMVM to CB. 

## 2014-04-22 NOTE — Telephone Encounter (Signed)
THe pain medication is indomethacin which was sent to his pharmacy at the time of his visit - can take 3 times a day with food.

## 2014-04-23 MED ORDER — HYDROCODONE-ACETAMINOPHEN 5-325 MG PO TABS: 1.0000 | ORAL_TABLET | Freq: Four times a day (QID) | ORAL | Status: DC | PRN

## 2014-04-23 NOTE — Telephone Encounter (Signed)
Rx printed.  Meds ordered this encounter  Medications  . HYDROcodone-acetaminophen (NORCO) 5-325 MG per tablet    Sig: Take 1 tablet by mouth every 6 (six) hours as needed (pain not relieved by indomethacin and colchicine).    Dispense:  15 tablet    Refill:  0    Order Specific Question:  Supervising Provider    Answer:  DOOLITTLE, ROBERT P [id number]

## 2014-04-23 NOTE — Telephone Encounter (Signed)
Pt wants something else for pain.  He states that the colchicine and indomethacine is not working for him at all.

## 2014-04-23 NOTE — Telephone Encounter (Signed)
Spoke to patient and advised him that requested medications were sent to pharmacy at the time of the visit.  Pt was very appreciative.

## 2014-04-23 NOTE — Telephone Encounter (Signed)
Notified pt stronger pain med ready for p/up. Advised pt that he should not drive while taking this medication. Pt agreed.

## 2014-04-23 NOTE — Telephone Encounter (Signed)
Advised pt scripts sent into the pharmacy.

## 2014-04-30 ENCOUNTER — Encounter: Payer: Self-pay | Admitting: Family Medicine

## 2014-04-30 NOTE — Progress Notes (Signed)
Subjective:   This chart was scribed for Delman Cheadle, MD, by Neta Ehlers, ED Scribe. This patient's care was started 9:48 AM.    Patient ID: Ruben Appl., male    DOB: Mar 20, 1956, 58 y.o.   MRN: [id number]  Chief Complaint  Patient presents with  . Gout    to the right big toe x's 4 days  . Recurrent Skin Infections    to the right axillary area x's 1 week    HPI  HPI Comments: Ruben Cressler. is a 58 y.o. male with a h/o HTN and gout, who presents to the Emergency Department complaining of suspected gout to his right great toe which began four days ago. The pt states the pain radiates distally through the dorsum of his foot, and he reports the pain prevents him from normal ROM. He denies any known injury/trauma to his toe; he recently worked in his garage.   The pt reports a h/o intermittent gout, stating he normally experiences an episode of gout approximately once every six months, though he states he normally experiences gout to his left foot.   To address the pain, he has used Aleve twice a day as well as icing the toe. He has not used Meloxicam. He is unsure if he has been prescribed other medications for gout, such as Colchicine.   He states he checks his BP often at home, and that he normally measures in the 130s/80s; in the ED his BP is 136/80. He takes his BP medication as prescribed, though he states he needs a medication refill. The pt is fasting.   Ruben Camacho also reports a suspected abscess to his right axilla which he first noticed a week ago. He reports drainage from the site. He has applied warm compressed to the affected site. He denies a h/o abscesses.   He reports sleep disturbances due to the pain from both his toe and abscess.   Past Medical History  Diagnosis Date  . Hypertension   . Hyperlipidemia   . Stroke 07/01/2012    L sided weakness, slurred speech.  Admission [facility].    . Gout     1-2 exacerbations per year.  Takes Advil PRN  for pain.   Current Outpatient Prescriptions on File Prior to Visit  Medication Sig Dispense Refill  . albuterol (PROVENTIL HFA;VENTOLIN HFA) 108 (90 BASE) MCG/ACT inhaler Inhale 2 puffs into the lungs every 4 (four) hours as needed for wheezing or shortness of breath (cough, shortness of breath or wheezing.).  1 Inhaler  1  . aspirin 325 MG tablet Take 325 mg by mouth daily.      . meloxicam (MOBIC) 15 MG tablet TAKE 1 TABLET BY MOUTH EVERY DAY  30 tablet  0  . sildenafil (VIAGRA) 100 MG tablet Take 0.5-1 tablets (50-100 mg total) by mouth daily as needed for erectile dysfunction.  5 tablet  11   No current facility-administered medications on file prior to visit.    No Known Allergies   Review of Systems  Constitutional: Negative for fever, chills, activity change and appetite change.  Genitourinary: Positive for genital sores.  Musculoskeletal: Positive for arthralgias, gait problem, joint swelling and myalgias.  Skin: Positive for color change, rash and wound. Negative for pallor.  Neurological: Negative for weakness and numbness.  Hematological: Positive for adenopathy.  Psychiatric/Behavioral: Positive for sleep disturbance.   Triage Vitals: BP 136/80  Pulse 74  Temp(Src) 98 F (36.7 C) (Oral)  Ht  5\' 9"  (1.753 m)  Wt 202 lb 9.6 oz (91.899 kg)  BMI 29.91 kg/m2  SpO2 97%     Objective:   Physical Exam  Nursing note and vitals reviewed. Constitutional: He is oriented to person, place, and time. He appears well-developed and well-nourished. No distress.  HENT:  Head: Normocephalic and atraumatic.  Eyes: Conjunctivae and EOM are normal.  Neck: Neck supple. No tracheal deviation present.  Cardiovascular: Normal rate.   Pulmonary/Chest: Effort normal. No respiratory distress.  Musculoskeletal:  1st MT joint of right great toe: Mild blanching erythematous, mild swelling, mild warmth and very hyperesthetic.   Severely decreased passive ROM  Neurological: He is alert and  oriented to person, place, and time.  Skin: Skin is warm and dry. There is erythema.  3 by 4 inch diameter with fluctuant central area of erythremic induration and drainage in right axilla. Warm.   Psychiatric: He has a normal mood and affect. His behavior is normal.      Assessment & Plan:    Acute gout of right foot, unspecified cause - Plan: POCT CBC, Comprehensive metabolic panel, Lipid panel, Uric acid, CANCELED: POCT SEDIMENTATION RATE, CANCELED: Wound culture - use colcrys at first sign of gout flair - take 3 tabs today and refill given so pt can keep at home for next gout onset.  Gout flairs rarely so pt wants to stay on hctz for now (though may need to d/c if freq increases) and does not need prophylactic med like allopurinol at this point.  Start indomethacin as well and cont until gout flair completely resolved.  Cellulitis of axilla, right - Plan: POCT CBC, Comprehensive metabolic panel, Lipid panel, Uric acid, CANCELED: POCT SEDIMENTATION RATE, CANCELED: Wound culture - I&D by Weber but wound clx not obtained as mainly just inflammation/induration. Cont warm compresses - not packed so RTC if worsens at all. Start doay  Essential hypertension, benign - Plan: POCT CBC, Comprehensive metabolic panel, Lipid panel, Uric acid, CANCELED: POCT SEDIMENTATION RATE, CANCELED: Wound culture  Other and unspecified hyperlipidemia - Plan: POCT CBC, Comprehensive metabolic panel, Lipid panel, Uric acid, CANCELED: POCT SEDIMENTATION RATE, CANCELED: Wound culture  Encounter for long-term (current) use of other medications - Plan: POCT CBC, Comprehensive metabolic panel, Lipid panel, Uric acid, CANCELED: POCT SEDIMENTATION RATE, CANCELED: Wound culture  Unspecified essential hypertension - Plan: amLODipine (NORVASC) 10 MG tablet, lisinopril (PRINIVIL,ZESTRIL) 40 MG tablet - refilled at current doses.  Meds ordered this encounter  Medications  . colchicine 0.6 MG tablet    Sig: Take 2 tabs po x 1 at  first sign of gout onset then take 1 tab 1 hr later.    Dispense:  3 tablet    Refill:  2  . doxycycline (VIBRAMYCIN) 100 MG capsule    Sig: Take 1 capsule (100 mg total) by mouth 2 (two) times daily.    Dispense:  20 capsule    Refill:  0  . indomethacin (INDOCIN) 50 MG capsule    Sig: Take 1 capsule (50 mg total) by mouth 3 (three) times daily with meals.    Dispense:  30 capsule    Refill:  1  . amLODipine (NORVASC) 10 MG tablet    Sig: Take 1 tablet (10 mg total) by mouth daily.    Dispense:  90 tablet    Refill:  1  . hydrochlorothiazide (HYDRODIURIL) 25 MG tablet    Sig: Take 1 tablet (25 mg total) by mouth daily.    Dispense:  90 tablet  Refill:  1  . lisinopril (PRINIVIL,ZESTRIL) 40 MG tablet    Sig: Take 1 tablet (40 mg total) by mouth daily.    Dispense:  90 tablet    Refill:  1  . metoprolol succinate (TOPROL-XL) 50 MG 24 hr tablet    Sig: Take 1 tablet (50 mg total) by mouth daily.    Dispense:  90 tablet    Refill:  1  . pravastatin (PRAVACHOL) 40 MG tablet    Sig: Take 1 tablet (40 mg total) by mouth daily. Pt needs labs for further refills    Dispense:  90 tablet    Refill:  1    I personally performed the services described in this documentation, which was scribed in my presence. The recorded information has been reviewed and considered, and addended by me as needed.  Delman Cheadle, MD MPH

## 2014-06-16 ENCOUNTER — Other Ambulatory Visit: Payer: Self-pay | Admitting: Family Medicine

## 2014-07-22 ENCOUNTER — Ambulatory Visit (INDEPENDENT_AMBULATORY_CARE_PROVIDER_SITE_OTHER): Payer: BC Managed Care – PPO | Admitting: Family Medicine

## 2014-07-22 VITALS — BP 100/68 | HR 64 | Temp 97.8°F | Resp 18 | Ht 69.0 in | Wt 198.0 lb

## 2014-07-22 DIAGNOSIS — IMO0001 Reserved for inherently not codable concepts without codable children: Secondary | ICD-10-CM

## 2014-07-22 DIAGNOSIS — L03019 Cellulitis of unspecified finger: Secondary | ICD-10-CM

## 2014-07-22 DIAGNOSIS — L03011 Cellulitis of right finger: Principal | ICD-10-CM

## 2014-07-22 MED ORDER — SULFAMETHOXAZOLE-TRIMETHOPRIM 800-160 MG PO TABS: 1.0000 | ORAL_TABLET | Freq: Two times a day (BID) | ORAL | Status: DC

## 2014-07-22 NOTE — Progress Notes (Signed)
   Subjective:    Patient ID: Ruben Appl., male    DOB: 1956/01/07, 58 y.o.   MRN: [id number]  HPI Ruben Camacho is a 58 year old right-hand-dominant male who presents with right ring finger "infection". Onset was about 4 days ago, when he noticed redness and pain around the medial 4th finger nail bed. No known injury or trauma. He may have clipped his nails a couple weeks before. He tired Epsom salt and neosporin with little relief. Aggravated with movement or using the finger. No lake or ocean exposure. 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  Denies any fevers, chills, tracking rash, or hx of diabetes. No known drug allergies.  Review of Systems As per history of present illness, 58 point review of systems was performed is otherwise negative.    Objective:   Physical Exam BP 100/68  Pulse 64  Temp(Src) 97.8 F (36.6 C) (Oral)  Resp 18  Ht 5\' 9"  (1.753 m)  Wt 198 lb (89.812 kg)  BMI 29.23 kg/m2  SpO2 98% General appearance: alert, cooperative, appears stated age and no distress Skin: Moderate size paronychia of right 4th medial nail. Mild surrounding erythema and subcutaneous pus seen. No tracking lymphangitis. Lymph nodes: No trochlear or axillary lymphadenopathy. MSK: Full ROM right wrist and fingers.  Procedure: Verbal consent was obtained from the patient to proceed with incision and drainage of the right fourth paronychia. Using an 11 blade scalpel a 1-2 cm incision was made along the swollen and infected finger. Immediate purulent drainage and pus was expressed from the wound. The wound was irrigated with 10 cc of sterile water. A Band-Aid was applied with a gauze dressing and hemostasis was achieved. He is given post procedural instructions regarding daily wound care with warm soap and water. He tolerated the procedure well without complications.    Assessment & Plan:  1. right fourth finger nail paronychia  -Patient tolerated the procedure of incision and drainage as stated above. -She  was given daily wound care as -We will start him on Bactrim double strength twice daily for 5 days. -If he notices increasing pain, redness, purulence, fevers, chills, or hand pain, he should return to the clinic or go to the emergency department. He verbalized understanding agreement.  Dr. Linnell Fulling, DO Sports Medicine Fellow

## 2014-07-22 NOTE — Patient Instructions (Signed)

## 2014-12-01 ENCOUNTER — Ambulatory Visit (INDEPENDENT_AMBULATORY_CARE_PROVIDER_SITE_OTHER): Payer: BLUE CROSS/BLUE SHIELD

## 2014-12-01 ENCOUNTER — Ambulatory Visit (INDEPENDENT_AMBULATORY_CARE_PROVIDER_SITE_OTHER): Payer: BLUE CROSS/BLUE SHIELD | Admitting: Family Medicine

## 2014-12-01 VITALS — BP 126/92 | HR 86 | Temp 98.7°F | Resp 18 | Ht 69.0 in | Wt 196.0 lb

## 2014-12-01 DIAGNOSIS — Z8739 Personal history of other diseases of the musculoskeletal system and connective tissue: Secondary | ICD-10-CM

## 2014-12-01 DIAGNOSIS — Z8639 Personal history of other endocrine, nutritional and metabolic disease: Secondary | ICD-10-CM

## 2014-12-01 DIAGNOSIS — M7022 Olecranon bursitis, left elbow: Secondary | ICD-10-CM

## 2014-12-01 DIAGNOSIS — M25522 Pain in left elbow: Secondary | ICD-10-CM

## 2014-12-01 DIAGNOSIS — M792 Neuralgia and neuritis, unspecified: Secondary | ICD-10-CM

## 2014-12-01 DIAGNOSIS — M5410 Radiculopathy, site unspecified: Secondary | ICD-10-CM

## 2014-12-01 DIAGNOSIS — M542 Cervicalgia: Secondary | ICD-10-CM

## 2014-12-01 LAB — POCT CBC
Granulocyte percent: 77.2 %G (ref 37–80)
HCT, POC: 44.4 % (ref 43.5–53.7)
Hemoglobin: 13.8 g/dL — AB (ref 14.1–18.1)
Lymph, poc: 1.7 (ref 0.6–3.4)
MCH, POC: 23.1 pg — AB (ref 27–31.2)
MCHC: 31.1 g/dL — AB (ref 31.8–35.4)
MCV: 74.2 fL — AB (ref 80–97)
MID (cbc): 0.2 (ref 0–0.9)
MPV: 8.6 fL (ref 0–99.8)
POC Granulocyte: 6.6 (ref 2–6.9)
POC LYMPH PERCENT: 20 %L (ref 10–50)
POC MID %: 2.8 %M (ref 0–12)
Platelet Count, POC: 169 10*3/uL (ref 142–424)
RBC: 5.98 M/uL (ref 4.69–6.13)
RDW, POC: 17.3 %
WBC: 8.6 10*3/uL (ref 4.6–10.2)

## 2014-12-01 LAB — COMPLETE METABOLIC PANEL WITH GFR
ALT: 23 U/L (ref 0–53)
AST: 18 U/L (ref 0–37)
Albumin: 4.1 g/dL (ref 3.5–5.2)
CO2: 20 mEq/L (ref 19–32)
Calcium: 9 mg/dL (ref 8.4–10.5)
Creat: 1.07 mg/dL (ref 0.50–1.35)
GFR, Est African American: 88 mL/min
GFR, Est Non African American: 76 mL/min
Glucose, Bld: 107 mg/dL — ABNORMAL HIGH (ref 70–99)
Total Bilirubin: 0.5 mg/dL (ref 0.2–1.2)
Total Protein: 6.7 g/dL (ref 6.0–8.3)

## 2014-12-01 LAB — COMPLETE METABOLIC PANEL WITHOUT GFR
Alkaline Phosphatase: 53 U/L (ref 39–117)
BUN: 15 mg/dL (ref 6–23)
Chloride: 108 meq/L (ref 96–112)
Potassium: 3.9 meq/L (ref 3.5–5.3)
Sodium: 142 meq/L (ref 135–145)

## 2014-12-01 LAB — URIC ACID: Uric Acid, Serum: 9.1 mg/dL — ABNORMAL HIGH (ref 4.0–7.8)

## 2014-12-01 MED ORDER — HYDROCODONE-ACETAMINOPHEN 5-325 MG PO TABS: 1.0000 | ORAL_TABLET | Freq: Three times a day (TID) | ORAL | Status: DC | PRN

## 2014-12-01 MED ORDER — INDOMETHACIN 50 MG PO CAPS: 50.0000 mg | ORAL_CAPSULE | Freq: Three times a day (TID) | ORAL | Status: DC

## 2014-12-01 NOTE — Patient Instructions (Signed)
Olecranon Bursitis Bursitis is swelling and soreness (inflammation) of a fluid-filled sac (bursa) that covers and protects a joint. Olecranon bursitis occurs over the elbow.  CAUSES Bursitis can be caused by injury, overuse of the joint, arthritis, or infection.  SYMPTOMS   Tenderness, swelling, warmth, or redness over the elbow.  Elbow pain with movement. This is greater with bending the elbow.  Squeaking sound when the bursa is rubbed or moved.  Increasing size of the bursa without pain or discomfort.  Fever with increasing pain and swelling if the bursa becomes infected. HOME CARE INSTRUCTIONS   Put ice on the affected area.  Put ice in a plastic bag.  Place a towel between your skin and the bag.  Leave the ice on for 15-20 minutes each hour while awake. Do this for the first 2 days.  When resting, elevate your elbow above the level of your heart. This helps reduce swelling.  Continue to put the joint through a full range of motion 4 times per day. Rest the injured joint at other times. When the pain lessens, begin normal slow movements and usual activities.  Only take over-the-counter or prescription medicines for pain, discomfort, or fever as directed by your caregiver.  Reduce your intake of milk and related dairy products (cheese, yogurt). They may make your condition worse. SEEK IMMEDIATE MEDICAL CARE IF:   Your pain increases even during treatment.  You have a fever.  You have heat and inflammation over the bursa and elbow.  You have a red line that goes up your arm.  You have pain with movement of your elbow. MAKE SURE YOU:   Understand these instructions.  Will watch your condition.  Will get help right away if you are not doing well or get worse. Document Released: 11/16/2006 Document Revised: 01/09/2012 Document Reviewed: 10/02/2007 ExitCare Patient Information 2015 [employer]. This information is not intended to replace advice given to you by your  health care provider. Make sure you discuss any questions you have with your health care provider.  

## 2014-12-01 NOTE — Progress Notes (Signed)
 Chief Complaint:  Chief Complaint  Patient presents with  . Joint Swelling    lt elbow over night   . Numbness    lt     HPI: Ruben Camacho. is a 59 y.o. male who is here for  Left neck pain on the anterior side, some numbness and tingling in that area strated 3 days ago. NKI Did not sleep wrong. He ahs elbow pain and arm pain but not sure.  NKI He has had stroke and this does not feel like it.  He also has acute onset of left elbow pain, redness and swelling, hx of gout. He has had gout attacks in foot before but never in elbow. He denies hitting it against anything He does not wants aspiration and fluids to be sent out. NKI.   Past Medical History  Diagnosis Date  . Hypertension   . Hyperlipidemia   . Stroke 07/01/2012    L sided weakness, slurred speech.  Admission [facility].    . Gout     1-2 exacerbations per year.  Takes Advil PRN for pain.   Past Surgical History  Procedure Laterality Date  . Hernia repair    . Admission  07/01/2012    CVA (L sided weakness, slurred speech).  [facility].  . Colonoscopy  10/31/2008    no polyps.  Repeat in 10 years.  [place].   History   Social History  . Marital Status: Married    Spouse Name: N/A    Number of Children: N/A  . Years of Education: N/A   Occupational History  . PRINCIPAL    Social History Main Topics  . Smoking status: Never Smoker   . Smokeless tobacco: None  . Alcohol Use: No     Comment: former drinker  . Drug Use: No  . Sexual Activity:    Partners: Female   Other Topics Concern  . None   Social History Narrative   Marital status: married      CHildren;  2 children; no grandchildren.      Lives: with wife.      Employment:  Pharmacist, hospital; principal in past.      Tobacco: none      Alcohol: weekends      Exercise:  Walking; weightlifting; basketball.   Family History  Problem Relation Age of Onset  . Stroke Mother   . Hypertension Mother   . Hypertension Daughter   . Cancer Maternal  Grandmother     lung   No Known Allergies Prior to Admission medications   Medication Sig Start Date End Date Taking? Authorizing Provider  albuterol (PROVENTIL HFA;VENTOLIN HFA) 108 (90 BASE) MCG/ACT inhaler Inhale 2 puffs into the lungs every 4 (four) hours as needed for wheezing or shortness of breath (cough, shortness of breath or wheezing.). 03/13/14  Yes Chelle S Jeffery, PA-C  amLODipine (NORVASC) 10 MG tablet Take 1 tablet (10 mg total) by mouth daily. 04/19/14  Yes Shawnee Knapp, MD  aspirin 325 MG tablet Take 325 mg by mouth daily.   Yes Historical Provider, MD  colchicine 0.6 MG tablet Take 2 tabs po x 1 at first sign of gout onset then take 1 tab 1 hr later. 04/19/14  Yes Shawnee Knapp, MD  hydrochlorothiazide (HYDRODIURIL) 25 MG tablet Take 1 tablet (25 mg total) by mouth daily. 04/19/14  Yes Shawnee Knapp, MD  HYDROcodone-acetaminophen (NORCO) 5-325 MG per tablet Take 1 tablet by mouth every 6 (six)  hours as needed (pain not relieved by indomethacin and colchicine). 04/23/14  Yes Chelle S Jeffery, PA-C  indomethacin (INDOCIN) 50 MG capsule Take 1 capsule (50 mg total) by mouth 3 (three) times daily with meals. 04/19/14  Yes Shawnee Knapp, MD  lisinopril (PRINIVIL,ZESTRIL) 40 MG tablet Take 1 tablet (40 mg total) by mouth daily. 04/19/14  Yes Shawnee Knapp, MD  meloxicam (MOBIC) 15 MG tablet TAKE 1 TABLET BY MOUTH EVERY DAY 02/21/14  Yes Heather M Marte, PA-C  metoprolol succinate (TOPROL-XL) 50 MG 24 hr tablet Take 1 tablet (50 mg total) by mouth daily. 04/19/14  Yes Shawnee Knapp, MD  pravastatin (PRAVACHOL) 40 MG tablet Take 1 tablet (40 mg total) by mouth daily. Pt needs labs for further refills 04/19/14  Yes Shawnee Knapp, MD  sildenafil (VIAGRA) 100 MG tablet Take 0.5-1 tablets (50-100 mg total) by mouth daily as needed for erectile dysfunction. 09/07/13  Yes Wendie Agreste, MD  doxycycline (VIBRAMYCIN) 100 MG capsule Take 1 capsule (100 mg total) by mouth 2 (two) times daily. Patient not taking: Reported on  12/01/2014 04/19/14   Shawnee Knapp, MD  sulfamethoxazole-trimethoprim (BACTRIM DS,SEPTRA DS) 800-160 MG per tablet Take 1 tablet by mouth 2 (two) times daily. Patient not taking: Reported on 12/01/2014 07/22/14   John C Pick-Jacobs, DO     ROS: The patient denies fevers, chills, night sweats, unintentional weight loss, chest pain, palpitations, wheezing, dyspnea on exertion, nausea, vomiting, abdominal pain, dysuria, hematuria, melena, + numbness, tingling.   All other systems have been reviewed and were otherwise negative with the exception of those mentioned in the HPI and as above.    PHYSICAL EXAM: Filed Vitals:   12/01/14 1019  BP: 126/92  Pulse: 86  Temp: 98.7 F (37.1 C)  Resp: 18   Filed Vitals:   12/01/14 1019  Height: 5\' 9"  (1.753 m)  Weight: 196 lb (88.905 kg)   Body mass index is 28.93 kg/(m^2).  General: Alert, no acute distress HEENT:  Normocephalic, atraumatic, oropharynx patent. EOMI, PERRLA Cardiovascular:  Regular rate and rhythm, no rubs murmurs or gallops.  No Carotid bruits, radial pulse intact. No pedal edema.  Respiratory: Clear to auscultation bilaterally.  No wheezes, rales, or rhonchi.  No cyanosis, no use of accessory musculature GI: No organomegaly, abdomen is soft and non-tender, positive bowel sounds.  No masses. Skin: No rashes. Neurologic: Facial musculature symmetric. Psychiatric: Patient is appropriate throughout our interaction. Lymphatic: No cervical lymphadenopathy Musculoskeletal: Gait intact. Neck exam normal ROM but POSITIVE spurling on the left Shoulder No deformity, no hypertrophy/atrophy, no erythema, no fluid, no wounds Full ROM [facility]ender at [facility] jt Neg Empty Can test, neg Lift off test, neg Speeds, Neg Hawkins/Neers 5/5 strength Left elbow-POSITIVE olecranon bursitis vs Gout vs less likely septic since has full ROM  Full ROM, tender, warm , fluctuant, no crepitus      LABS: Results for orders placed or performed in visit on  12/01/14  COMPLETE METABOLIC PANEL WITH GFR  Result Value Ref Range   Sodium 142 135 - 145 mEq/L   Potassium 3.9 3.5 - 5.3 mEq/L   Chloride 108 96 - 112 mEq/L   CO2 20 19 - 32 mEq/L   Glucose, Bld 107 (H) 70 - 99 mg/dL   BUN 15 6 - 23 mg/dL   Creat 1.07 0.50 - 1.35 mg/dL   Total Bilirubin 0.5 0.2 - 1.2 mg/dL   Alkaline Phosphatase 53 39 - 117 U/L   AST  18 0 - 37 U/L   ALT 23 0 - 53 U/L   Total Protein 6.7 6.0 - 8.3 g/dL   Albumin 4.1 3.5 - 5.2 g/dL   Calcium 9.0 8.4 - 10.5 mg/dL   GFR, Est African American 88 mL/min   GFR, Est Non African American 76 mL/min  Uric Acid  Result Value Ref Range   Uric Acid, Serum 9.1 (H) 4.0 - 7.8 mg/dL  POCT CBC  Result Value Ref Range   WBC 8.6 4.6 - 10.2 K/uL   Lymph, poc 1.7 0.6 - 3.4   POC LYMPH PERCENT 20.0 10 - 50 %L   MID (cbc) 0.2 0 - 0.9   POC MID % 2.8 0 - 12 %M   POC Granulocyte 6.6 2 - 6.9   Granulocyte percent 77.2 37 - 80 %G   RBC 5.98 4.69 - 6.13 M/uL   Hemoglobin 13.8 (A) 14.1 - 18.1 g/dL   HCT, POC 44.4 43.5 - 53.7 %   MCV 74.2 (A) 80 - 97 fL   MCH, POC 23.1 (A) 27 - 31.2 pg   MCHC 31.1 (A) 31.8 - 35.4 g/dL   RDW, POC 17.3 %   Platelet Count, POC 169 142 - 424 K/uL   MPV 8.6 0 - 99.8 fL     EKG/XRAY:   Primary read interpreted by Dr. Marin Comment at [facility]. Neg for fracture or dislocation + djd, please comment if there are any concerns for cyst like changes on proximal ulna/radius Significant anterior osteophytes of c4-6   ASSESSMENT/PLAN: Encounter Diagnoses  Name Primary?  . Elbow pain, left Yes  . Olecranon bursitis of left elbow   . History of gout   . Cervical pain (neck)   . Radicular pain    Will treat with sling  RX for indomethacine and also norco given Precautions given for spetic jt or worsning sxs Work note given Labs pending He opted to treat conservatively and did not was aspiration F/u prn  Gross sideeffects, risk and benefits, and alternatives of medications d/w patient. Patient is aware that all  medications have potential sideeffects and we are unable to predict every sideeffect or drug-drug interaction that may occur.  , [place], DO 12/02/2014 1:40 PM

## 2014-12-02 ENCOUNTER — Encounter: Payer: Self-pay | Admitting: Family Medicine

## 2014-12-02 ENCOUNTER — Telehealth: Payer: Self-pay | Admitting: Family Medicine

## 2014-12-02 NOTE — Telephone Encounter (Signed)
LM to call return for elbow xrays, the radiologist wanted 4 views to rule out lytic lesions. He can come in anytime 1-4 weeks from now.  Lab show elevated uric acid, CMP was nl

## 2014-12-03 ENCOUNTER — Ambulatory Visit (INDEPENDENT_AMBULATORY_CARE_PROVIDER_SITE_OTHER): Payer: BLUE CROSS/BLUE SHIELD | Admitting: Urgent Care

## 2014-12-03 VITALS — BP 142/94 | HR 78 | Temp 98.0°F | Resp 17 | Ht 69.5 in | Wt 196.0 lb

## 2014-12-03 DIAGNOSIS — Z8739 Personal history of other diseases of the musculoskeletal system and connective tissue: Secondary | ICD-10-CM

## 2014-12-03 DIAGNOSIS — M109 Gout, unspecified: Secondary | ICD-10-CM

## 2014-12-03 DIAGNOSIS — M79642 Pain in left hand: Secondary | ICD-10-CM

## 2014-12-03 DIAGNOSIS — R200 Anesthesia of skin: Secondary | ICD-10-CM

## 2014-12-03 DIAGNOSIS — M10022 Idiopathic gout, left elbow: Secondary | ICD-10-CM

## 2014-12-03 DIAGNOSIS — M25572 Pain in left ankle and joints of left foot: Secondary | ICD-10-CM

## 2014-12-03 DIAGNOSIS — M25571 Pain in right ankle and joints of right foot: Secondary | ICD-10-CM

## 2014-12-03 DIAGNOSIS — Z8639 Personal history of other endocrine, nutritional and metabolic disease: Secondary | ICD-10-CM

## 2014-12-03 DIAGNOSIS — M79641 Pain in right hand: Secondary | ICD-10-CM

## 2014-12-03 DIAGNOSIS — R208 Other disturbances of skin sensation: Secondary | ICD-10-CM

## 2014-12-03 LAB — GLUCOSE, POCT (MANUAL RESULT ENTRY): POC Glucose: 110 mg/dl — AB (ref 70–99)

## 2014-12-03 LAB — RHEUMATOID FACTOR: Rhuematoid fact SerPl-aCnc: <10 IU/mL (ref <=14)

## 2014-12-03 MED ORDER — PREDNISONE 20 MG PO TABS: ORAL_TABLET | ORAL | Status: DC

## 2014-12-03 NOTE — Patient Instructions (Addendum)
Please stop taking Hydrochlorothiazide, one of your blood pressure medications since this may be contributing to your symptoms. Continue all other blood pressure medications.  We will start a short course of steroids to help with your inflammation. We will add on Allopurinol after your gout flare calms down to help prevent future flares. I would also like you to return in 3-4 weeks to repeat labs. However, if your symptoms worsen before then, please return to our clinic.   Gout Gout is an inflammatory arthritis caused by a buildup of uric acid crystals in the joints. Uric acid is a chemical that is normally present in the blood. When the level of uric acid in the blood is too high it can form crystals that deposit in your joints and tissues. This causes joint redness, soreness, and swelling (inflammation). Repeat attacks are common. Over time, uric acid crystals can form into masses (tophi) near a joint, destroying bone and causing disfigurement. Gout is treatable and often preventable. CAUSES  The disease begins with elevated levels of uric acid in the blood. Uric acid is produced by your body when it breaks down a naturally found substance called purines. Certain foods you eat, such as meats and fish, contain high amounts of purines. Causes of an elevated uric acid level include:  Being passed down from parent to child (heredity).  Diseases that cause increased uric acid production (such as obesity, psoriasis, and certain cancers).  Excessive alcohol use.  Diet, especially diets rich in meat and seafood.  Medicines, including certain cancer-fighting medicines (chemotherapy), water pills (diuretics), and aspirin.  Chronic kidney disease. The kidneys are no longer able to remove uric acid well.  Problems with metabolism. Conditions strongly associated with gout include:  Obesity.  High blood pressure.  High cholesterol.  Diabetes. Not everyone with elevated uric acid levels gets gout.  It is not understood why some people get gout and others do not. Surgery, joint injury, and eating too much of certain foods are some of the factors that can lead to gout attacks. SYMPTOMS   An attack of gout comes on quickly. It causes intense pain with redness, swelling, and warmth in a joint.  Fever can occur.  Often, only one joint is involved. Certain joints are more commonly involved:  Base of the big toe.  Knee.  Ankle.  Wrist.  Finger. Without treatment, an attack usually goes away in a few days to weeks. Between attacks, you usually will not have symptoms, which is different from many other forms of arthritis. DIAGNOSIS  Your caregiver will suspect gout based on your symptoms and exam. In some cases, tests may be recommended. The tests may include:  Blood tests.  Urine tests.  X-rays.  Joint fluid exam. This exam requires a needle to remove fluid from the joint (arthrocentesis). Using a microscope, gout is confirmed when uric acid crystals are seen in the joint fluid. TREATMENT  There are two phases to gout treatment: treating the sudden onset (acute) attack and preventing attacks (prophylaxis).  Treatment of an Acute Attack.  Medicines are used. These include anti-inflammatory medicines or steroid medicines.  An injection of steroid medicine into the affected joint is sometimes necessary.  The painful joint is rested. Movement can worsen the arthritis.  You may use warm or cold treatments on painful joints, depending which works best for you.  Treatment to Prevent Attacks.  If you suffer from frequent gout attacks, your caregiver may advise preventive medicine. These medicines are started after the  acute attack subsides. These medicines either help your kidneys eliminate uric acid from your body or decrease your uric acid production. You may need to stay on these medicines for a very long time.  The early phase of treatment with preventive medicine can be  associated with an increase in acute gout attacks. For this reason, during the first few months of treatment, your caregiver may also advise you to take medicines usually used for acute gout treatment. Be sure you understand your caregiver's directions. Your caregiver may make several adjustments to your medicine dose before these medicines are effective.  Discuss dietary treatment with your caregiver or dietitian. Alcohol and drinks high in sugar and fructose and foods such as meat, poultry, and seafood can increase uric acid levels. Your caregiver or dietitian can advise you on drinks and foods that should be limited. HOME CARE INSTRUCTIONS   Do not take aspirin to relieve pain. This raises uric acid levels.  Only take over-the-counter or prescription medicines for pain, discomfort, or fever as directed by your caregiver.  Rest the joint as much as possible. When in bed, keep sheets and blankets off painful areas.  Keep the affected joint raised (elevated).  Apply warm or cold treatments to painful joints. Use of warm or cold treatments depends on which works best for you.  Use crutches if the painful joint is in your leg.  Drink enough fluids to keep your urine clear or pale yellow. This helps your body get rid of uric acid. Limit alcohol, sugary drinks, and fructose drinks.  Follow your dietary instructions. Pay careful attention to the amount of protein you eat. Your daily diet should emphasize fruits, vegetables, whole grains, and fat-free or low-fat milk products. Discuss the use of coffee, vitamin C, and cherries with your caregiver or dietitian. These may be helpful in lowering uric acid levels.  Maintain a healthy body weight. SEEK MEDICAL CARE IF:   You develop diarrhea, vomiting, or any side effects from medicines.  You do not feel better in 24 hours, or you are getting worse. SEEK IMMEDIATE MEDICAL CARE IF:   Your joint becomes suddenly more tender, and you have chills or a  fever. MAKE SURE YOU:   Understand these instructions.  Will watch your condition.  Will get help right away if you are not doing well or get worse. Document Released: 10/14/2000 Document Revised: 03/03/2014 Document Reviewed: 05/30/2012 [facility] Patient Information 2015 [place], [place]. This information is not intended to replace advice given to you by your health care provider. Make sure you discuss any questions you have with your health care provider.

## 2014-12-03 NOTE — Progress Notes (Signed)
MRN: [id number] DOB: 1956-08-21  Subjective:   Ruben Norden. is a 59 y.o. male presenting for chief complaint of Shoulder Pain  Reports left elbow pain started 11/30/2014 with associated elbow swelling, numbness in left shoulder. Was seen at [facility], rx indomethacin for elevated uric acid level and Norco for pain. Patient declined aspiration at the time, states that indomethacin and and Norco have helped some. However, now has 1 day history of bilateral hand pain and swelling over knuckles L>R and bilateral ankle pain. Pain is aggravated with movement and planting his feet. Of note, left shoulder numbness is significantly improved. Denies fevers, increased swelling in left elbow, swelling in ankles redness, chest pain, shob, abdominal pain, n/v, diarrhea, urinary changes (voiding ~4x per day). Admits a hx of gout, previously took allopurinol but never had it refilled when he ran out. Denies any other aggravating or relieving factors, no other questions or concerns.  Ruben Camacho has a current medication list which includes the following prescription(s): albuterol, amlodipine, aspirin, colchicine, hydrochlorothiazide, hydrocodone-acetaminophen, indomethacin, lisinopril, metoprolol succinate, pravastatin, and sildenafil.  He has No Known Allergies.  Ruben Camacho  has a past medical history of Hypertension; Hyperlipidemia; Stroke (07/01/2012); and Gout. Also  has past surgical history that includes Hernia repair; Admission (07/01/2012); and Colonoscopy (10/31/2008).  ROS As in subjective.  Objective:   Vitals: BP 142/94 mmHg  Pulse 78  Temp(Src) 98 F (36.7 C) (Oral)  Resp 17  Ht 5' 9.5" (1.765 m)  Wt 196 lb (88.905 kg)  BMI 28.54 kg/m2  SpO2 96%  BP Readings from Last 3 Encounters:  12/03/14 142/94  12/01/14 126/92  07/22/14 100/68   Physical Exam  Constitutional: He is oriented to person, place, and time.  Cardiovascular: Normal rate, regular rhythm, normal heart sounds  and intact distal pulses.  Exam reveals no gallop and no friction rub.   No murmur heard. Pulmonary/Chest: Effort normal and breath sounds normal. No respiratory distress. He has no wheezes. He has no rales. He exhibits no tenderness.  Abdominal: Bowel sounds are normal.  Musculoskeletal:       Left elbow: He exhibits swelling (mild-moderate swelling). He exhibits normal range of motion, no deformity and no laceration. Tenderness (near olecranon process) found.       Right ankle: He exhibits normal range of motion, no swelling, no ecchymosis and no deformity. Tenderness (over dorsal surface).       Left ankle: He exhibits normal range of motion, no swelling, no ecchymosis and no deformity. Tenderness (over dorsal surface).       Right hand: He exhibits decreased range of motion (finger flexion L>R), tenderness (over dorsal 2-3rd mcp), bony tenderness (over 2nd-3rd mcp) and swelling (over 2-3 mcp). He exhibits normal capillary refill and no deformity (no swan neck or buotinniere, no ulnar deviation). Decreased sensation (gripping, secondary to pain) noted. Normal strength noted.       Left hand: He exhibits decreased range of motion (with finger flexion). He exhibits no tenderness, no bony tenderness, normal capillary refill, no deformity and no swelling. Normal sensation noted. Normal strength noted.  Neurological: He is alert and oriented to person, place, and time.  Skin: Skin is warm and dry.  Psychiatric: Mood and affect normal.   Results for orders placed or performed in visit on 12/03/14 (from the past 24 hour(s))  POCT glucose (manual entry)     Status: Abnormal   Collection Time: 12/03/14  9:21 AM  Result Value Ref Range   POC  Glucose 110 (A) 70 - 99 mg/dl   Assessment and Plan :   1. Bilateral ankle joint pain 2. Bilateral hand pain 3. Left arm numbness 4. History of gout 5. Acute gout of left elbow, unspecified cause - Atypical presentation, unlikely to be infectious process.  Advised that he stop HCT until symptoms resolve to eliminate possibility of diuretic-induced gout flare, start prednisone course, continue indomethacin. - Advised that once this flare resolves, consider returning to re-evaluate, consider ESR, CRP; ANA, RF pending - Will restart Allopurinol at follow up in ~3-4 weeks   Jaynee Eagles, PA-C [facility] [phone] 12/03/2014 9:23 AM

## 2014-12-04 ENCOUNTER — Encounter: Payer: Self-pay | Admitting: Urgent Care

## 2014-12-04 LAB — ANA: Anti Nuclear Antibody(ANA): NEGATIVE

## 2014-12-08 ENCOUNTER — Encounter (HOSPITAL_COMMUNITY): Payer: Self-pay

## 2014-12-08 ENCOUNTER — Emergency Department (HOSPITAL_COMMUNITY)
Admission: EM | Admit: 2014-12-08 | Discharge: 2014-12-08 | Disposition: A | Discharge status: Home / Self Care | Payer: BC Managed Care – PPO | Attending: Emergency Medicine | Admitting: Emergency Medicine

## 2014-12-08 ENCOUNTER — Emergency Department (HOSPITAL_COMMUNITY): Payer: BC Managed Care – PPO

## 2014-12-08 DIAGNOSIS — Z7952 Long term (current) use of systemic steroids: Secondary | ICD-10-CM | POA: Diagnosis not present

## 2014-12-08 DIAGNOSIS — Z8639 Personal history of other endocrine, nutritional and metabolic disease: Secondary | ICD-10-CM | POA: Insufficient documentation

## 2014-12-08 DIAGNOSIS — Z8673 Personal history of transient ischemic attack (TIA), and cerebral infarction without residual deficits: Secondary | ICD-10-CM | POA: Diagnosis not present

## 2014-12-08 DIAGNOSIS — G43009 Migraine without aura, not intractable, without status migrainosus: Secondary | ICD-10-CM

## 2014-12-08 DIAGNOSIS — Z8739 Personal history of other diseases of the musculoskeletal system and connective tissue: Secondary | ICD-10-CM | POA: Diagnosis not present

## 2014-12-08 DIAGNOSIS — Z79899 Other long term (current) drug therapy: Secondary | ICD-10-CM | POA: Insufficient documentation

## 2014-12-08 DIAGNOSIS — Z7982 Long term (current) use of aspirin: Secondary | ICD-10-CM | POA: Insufficient documentation

## 2014-12-08 DIAGNOSIS — G43809 Other migraine, not intractable, without status migrainosus: Secondary | ICD-10-CM | POA: Diagnosis not present

## 2014-12-08 DIAGNOSIS — I10 Essential (primary) hypertension: Secondary | ICD-10-CM | POA: Insufficient documentation

## 2014-12-08 DIAGNOSIS — R4182 Altered mental status, unspecified: Secondary | ICD-10-CM | POA: Diagnosis present

## 2014-12-08 LAB — COMPREHENSIVE METABOLIC PANEL
ALK PHOS: 51 U/L (ref 39–117)
ALT: 26 U/L (ref 0–53)
ANION GAP: 10 (ref 5–15)
AST: 26 U/L (ref 0–37)
Albumin: 3.6 g/dL (ref 3.5–5.2)
BILIRUBIN TOTAL: 0.4 mg/dL (ref 0.3–1.2)
BUN: 19 mg/dL (ref 6–23)
CO2: 28 mmol/L (ref 19–32)
Calcium: 9.2 mg/dL (ref 8.4–10.5)
Chloride: 105 mmol/L (ref 96–112)
Creatinine, Ser: 1.07 mg/dL (ref 0.50–1.35)
GFR calc non Af Amer: 75 mL/min — ABNORMAL LOW (ref >90)
GFR, EST AFRICAN AMERICAN: 87 mL/min — AB (ref >90)
Glucose, Bld: 166 mg/dL — ABNORMAL HIGH (ref 70–99)
POTASSIUM: 3.4 mmol/L — AB (ref 3.5–5.1)
Sodium: 143 mmol/L (ref 135–145)
Total Protein: 7.1 g/dL (ref 6.0–8.3)

## 2014-12-08 LAB — PROTIME-INR
INR: 0.98 (ref 0.00–1.49)
Prothrombin Time: 13.1 seconds (ref 11.6–15.2)

## 2014-12-08 LAB — URINALYSIS, ROUTINE W REFLEX MICROSCOPIC
Bilirubin Urine: NEGATIVE
Glucose, UA: NEGATIVE mg/dL
Hgb urine dipstick: NEGATIVE
KETONES UR: NEGATIVE mg/dL
Leukocytes, UA: NEGATIVE
Nitrite: NEGATIVE
PROTEIN: 30 mg/dL — AB
Specific Gravity, Urine: 1.026 (ref 1.005–1.030)
Urobilinogen, UA: 1 mg/dL (ref 0.0–1.0)
pH: 5.5 (ref 5.0–8.0)

## 2014-12-08 LAB — CBC
HCT: 44.7 % (ref 39.0–52.0)
HEMOGLOBIN: 15 g/dL (ref 13.0–17.0)
MCH: 24.4 pg — ABNORMAL LOW (ref 26.0–34.0)
MCHC: 33.6 g/dL (ref 30.0–36.0)
MCV: 72.6 fL — ABNORMAL LOW (ref 78.0–100.0)
PLATELETS: 186 10*3/uL (ref 150–400)
RBC: 6.16 MIL/uL — ABNORMAL HIGH (ref 4.22–5.81)
RDW: 16.8 % — AB (ref 11.5–15.5)
WBC: 6.7 10*3/uL (ref 4.0–10.5)

## 2014-12-08 LAB — DIFFERENTIAL
BASOS ABS: 0 10*3/uL (ref 0.0–0.1)
BASOS PCT: 0 % (ref 0–1)
Eosinophils Absolute: 0.1 10*3/uL (ref 0.0–0.7)
Eosinophils Relative: 1 % (ref 0–5)
LYMPHS PCT: 29 % (ref 12–46)
Lymphs Abs: 1.9 10*3/uL (ref 0.7–4.0)
Monocytes Absolute: 0.5 10*3/uL (ref 0.1–1.0)
Monocytes Relative: 7 % (ref 3–12)
Neutro Abs: 4.2 10*3/uL (ref 1.7–7.7)
Neutrophils Relative %: 63 % (ref 43–77)

## 2014-12-08 LAB — URINE MICROSCOPIC-ADD ON

## 2014-12-08 LAB — RAPID URINE DRUG SCREEN, HOSP PERFORMED
Amphetamines: NOT DETECTED
BENZODIAZEPINES: NOT DETECTED
Barbiturates: NOT DETECTED
Cocaine: NOT DETECTED
Opiates: POSITIVE — AB
Tetrahydrocannabinol: NOT DETECTED

## 2014-12-08 LAB — CBG MONITORING, ED: Glucose-Capillary: 166 mg/dL — ABNORMAL HIGH (ref 70–99)

## 2014-12-08 LAB — I-STAT CHEM 8, ED
BUN: 20 mg/dL (ref 6–23)
CHLORIDE: 109 mmol/L (ref 96–112)
Calcium, Ion: 1.05 mmol/L — ABNORMAL LOW (ref 1.12–1.23)
Creatinine, Ser: 1 mg/dL (ref 0.50–1.35)
GLUCOSE: 168 mg/dL — AB (ref 70–99)
HCT: 47 % (ref 39.0–52.0)
HEMOGLOBIN: 16 g/dL (ref 13.0–17.0)
Potassium: 3.3 mmol/L — ABNORMAL LOW (ref 3.5–5.1)
Sodium: 141 mmol/L (ref 135–145)
TCO2: 24 mmol/L (ref 0–100)

## 2014-12-08 LAB — I-STAT TROPONIN, ED: Troponin i, poc: 0 ng/mL (ref 0.00–0.08)

## 2014-12-08 LAB — APTT: APTT: 25 s (ref 24–37)

## 2014-12-08 MED ORDER — METOCLOPRAMIDE HCL 5 MG/ML IJ SOLN
10.0000 mg | Freq: Once | INTRAMUSCULAR | Status: AC
  Administered 2014-12-08: 10 mg via INTRAVENOUS
  Filled 2014-12-08: qty 2

## 2014-12-08 MED ORDER — DIPHENHYDRAMINE HCL 50 MG/ML IJ SOLN
12.5000 mg | Freq: Once | INTRAMUSCULAR | Status: AC
  Administered 2014-12-08: 12.5 mg via INTRAVENOUS
  Filled 2014-12-08: qty 1

## 2014-12-08 MED ORDER — KETOROLAC TROMETHAMINE 30 MG/ML IJ SOLN
30.0000 mg | Freq: Once | INTRAMUSCULAR | Status: AC
  Administered 2014-12-08: 30 mg via INTRAVENOUS
  Filled 2014-12-08: qty 1

## 2014-12-08 NOTE — Consult Note (Signed)
Neurology Consultation Reason for Consult: Speech changes Referring Physician: Zenia Resides, a  CC: Speech changes  History is obtained from: Patient  HPI: Ruben Camacho. is a 59 y.o. male with a history of previous stroke admitted to [facility] in 2013. Unfortunately do not have those records to review at this time. There is also an MRI in the system from 2005 which was performed with the indication "Sudden onset right side numbness and weakness" which was negative.  He states that he woke up this morning and was feeling weak all over. He was brought in via EMS and stated that he had been having problems with speaking since around 3 AM.  Currently he states that his speech is worse than normal, but it is slower than typical. He does sound better than what was reported earlier with him only looking around on arrival. There is no clear sign of previous cortical infarcts on his MRI from today though he does have some small vessel ischemic change. He also endorses photophobic headache which has been present since awakening.  He denies any recent infections, but does state that he had a gout flare one week ago.   ROS: A 14 point ROS was performed and is negative except as noted in the HPI.   Past Medical History  Diagnosis Date  . Hypertension   . Hyperlipidemia   . Stroke 07/01/2012    L sided weakness, slurred speech.  Admission [facility].    . Gout     1-2 exacerbations per year.  Takes Advil PRN for pain.    Family History: Positive for history of migraines  Social History: Tob: Denies Denies heavy drinking  Exam: Current vital signs: BP 172/98 mmHg  Pulse 75  Resp 19  SpO2 93% Vital signs in last 24 hours: Pulse Rate:  [72-94] 75 (02/08 1200) Resp:  [15-26] 19 (02/08 1200) BP: (152-192)/(81-103) 172/98 mmHg (02/08 1200) SpO2:  [92 %-99 %] 93 % (02/08 1200)  Physical Exam  Constitutional: Appears well-developed and well-nourished.  Psych: Affect appropriate  to situation Eyes: No scleral injection HENT: No OP obstrucion Head: Normocephalic.  Cardiovascular: Normal rate and regular rhythm.  Respiratory: Effort normal and breath sounds normal to anterior ascultation GI: Soft.  No distension. There is no tenderness.  Skin: WDI  Neuro: Mental Status: Patient is awake, alert, oriented to person, place, month, year, and situation. Patient is able to give a clear and coherent history. He has a slow stuttering speech pattern which is inconsistent, at times becoming much more fluent than at other times. Cranial Nerves: II: Visual Fields are full. Pupils are equal, round, and reactive to light.   III,IV, VI: EOMI without ptosis or diploplia.  V: Facial sensation is symmetric to temperature VII: Facial movement is symmetric, though he does not smile very well on either side, he also will not puff out his cheeks when prompted to do so making an apparent effort to do so but without result VIII: hearing is intact to voice X: Uvula elevates symmetrically XI: Shoulder shrug is symmetric. XII: tongue is midline without atrophy or fasciculations.  Motor: Tone is normal. Bulk is normal. He has give way weakness in all 4 extremities, he has tremor which is distractible in all 4 extremities. He has downward drift without pronation. Sensory: Sensation is symmetric to light touch  in the arms and legs. Plantars: Toes are downgoing bilaterally.  Cerebellar: When asked to perform figureinitially, he is unable to do so but with "helped  starting" with repositioning his finger and moving it back and forth he is able to continue to do so normally with no apparent weakness.   I have reviewed labs in epic and the results pertinent to this consultation are: CMP-unremarkable  I have reviewed the images obtained: MRI brain-no acute findings  Impression: 59 year old male with altered mental status, speech changes, generalized weakness with multiple findings on exam  consistent with a nonphysiological etiology. The headache that he describes could be migrainous in nature and therefore would be reasonable to treat the migraine. I encouraged him that his MRI results showed that there was no stroke and that I expect him to improve.  Recommendations: 1) treat migraine with Reglan, Benadryl, Toradol. 2) no further neurological workup needed at this time.    Roland Rack, MD [facility] [phone]  If 7pm- 7am, please page neurology on call as listed in [place].

## 2014-12-08 NOTE — ED Notes (Signed)
Sat pt up on the side of the bed. Pt stated "not dizzy". When helping pt stand he became dizzy. Pt stated he could "only walk to the door." Pt also stated that he has "gout in the left foot". Pt walked very slow and was unsteady.

## 2014-12-08 NOTE — ED Notes (Signed)
Pt CBG, 166. Nurse was notified.

## 2014-12-08 NOTE — ED Notes (Signed)
Family at bedside. 

## 2014-12-08 NOTE — ED Notes (Signed)
From home via [facility], called with c/o difficulty speaking, no known LSN, HTN, CBG 158, 20g left hand

## 2014-12-08 NOTE — Discharge Instructions (Signed)
Please read and follow all provided instructions.  Your diagnoses today include:  1. Atypical migraine     Tests performed today include:  CT/MRI does not show any new stroke  Blood counts and electrolytes  Vital signs. See below for your results today.   Medications prescribed:   None  Take any prescribed medications only as directed.  Home care instructions:  Follow any educational materials contained in this packet.  BE VERY CAREFUL not to take multiple medicines containing Tylenol (also called acetaminophen). Doing so can lead to an overdose which can damage your liver and cause liver failure and possibly death.   Follow-up instructions: Please follow-up with your primary care provider in the next 3 days for further evaluation of your symptoms. Please see referral.   Return instructions:  [employer] IF:  There is confusion or drowsiness (although children frequently become drowsy after injury).   You cannot awaken the injured person.   You have more than one episode of vomiting.   You notice dizziness or unsteadiness which is getting worse, or inability to walk.   You have convulsions or unconsciousness.   You experience severe, persistent headaches not relieved by Tylenol.  You cannot use arms or legs normally.   There are changes in pupil sizes. (This is the black center in the colored part of the eye)   There is clear or bloody discharge from the nose or ears.   You have change in speech, vision, swallowing, or understanding.   Localized weakness, numbness, tingling, or change in bowel or bladder control.  You have any other emergent concerns.  Your vital signs today were: BP 163/88 mmHg   Pulse 81   Resp 21   SpO2 96% If your blood pressure (BP) was elevated above 135/85 this visit, please have this repeated by your doctor within one month. --------------

## 2014-12-08 NOTE — ED Notes (Signed)
Neurologist at bedside. 

## 2014-12-08 NOTE — ED Provider Notes (Signed)
Medical screening examination/treatment/procedure(s) were conducted as a shared visit with non-physician practitioner(s) and myself.  I personally evaluated the patient during the encounter.   EKG Interpretation   Date/Time:  Monday December 08 2014 07:01:31 EST Ventricular Rate:  94 PR Interval:  68 QRS Duration: 84 QT Interval:  353 QTC Calculation: 441 R Axis:   -17 Text Interpretation:  Sinus rhythm Atrial premature complex Short PR  interval Probable left atrial enlargement Borderline left axis deviation  RSR' in V1 or V2, probably normal variant J point elevation similar to  prior EKGs Baseline wander in lead(s) V1 No significant change since last  tracing Confirmed by WARD,  DO, KRISTEN [id number]) on 12/08/2014 7:11:51 AM     Patient here with unknown last seen normal time with trouble speaking as well as moving his extremities per history. On my exam, patient has a Glasgow Coma Scale of 9. Patient able to protect his airway. We'll obtain neurology consult as well as MRI brain. Code stroke Called due to unknown lsn  Leota Jacobsen, MD 12/08/14 [phone]

## 2014-12-08 NOTE — ED Provider Notes (Signed)
CSN: [id number]     Arrival date & time 12/08/14  [id number] History   First MD Initiated Contact with Patient 12/08/14 [phone]     Chief Complaint  Patient presents with  . Altered Mental Status     (Consider location/radiation/quality/duration/timing/severity/associated sxs/prior Treatment) HPI Comments: Patient with history of hypertension, hyperlipidemia, stroke with sided residuals occluding weakness and slurred speech per medical records -- presents by EMS with altered mental status. Per EMS, patient reported difficulty speaking at approximately 3AM. He was answering questions and following basic commands per EMS. Upon ED arrival, he became less responsive, only looking around. No longer following commands. Level V caveat altered LOC.   Patient is a 59 y.o. male presenting with altered mental status. The history is provided by medical records and the EMS personnel.  Altered Mental Status   Past Medical History  Diagnosis Date  . Hypertension   . Hyperlipidemia   . Stroke 07/01/2012    L sided weakness, slurred speech.  Admission [facility].    . Gout     1-2 exacerbations per year.  Takes Advil PRN for pain.   Past Surgical History  Procedure Laterality Date  . Hernia repair    . Admission  07/01/2012    CVA (L sided weakness, slurred speech).  [facility].  . Colonoscopy  10/31/2008    no polyps.  Repeat in 10 years.  [place].   Family History  Problem Relation Age of Onset  . Stroke Mother   . Hypertension Mother   . Hypertension Daughter   . Cancer Maternal Grandmother     lung   History  Substance Use Topics  . Smoking status: Never Smoker   . Smokeless tobacco: Not on file  . Alcohol Use: No     Comment: former drinker    Review of Systems  Unable to perform ROS: Mental status change      Allergies  Review of patient's allergies indicates no known allergies.  Home Medications   Prior to Admission medications   Medication Sig Start Date End Date Taking?  Authorizing Provider  albuterol (PROVENTIL HFA;VENTOLIN HFA) 108 (90 BASE) MCG/ACT inhaler Inhale 2 puffs into the lungs every 4 (four) hours as needed for wheezing or shortness of breath (cough, shortness of breath or wheezing.). 03/13/14   Chelle S Jeffery, PA-C  amLODipine (NORVASC) 10 MG tablet Take 1 tablet (10 mg total) by mouth daily. 04/19/14   Shawnee Knapp, MD  aspirin 325 MG tablet Take 325 mg by mouth daily.    Historical Provider, MD  colchicine 0.6 MG tablet Take 2 tabs po x 1 at first sign of gout onset then take 1 tab 1 hr later. 04/19/14   Shawnee Knapp, MD  hydrochlorothiazide (HYDRODIURIL) 25 MG tablet Take 1 tablet (25 mg total) by mouth daily. 04/19/14   Shawnee Knapp, MD  HYDROcodone-acetaminophen (NORCO) 5-325 MG per tablet Take 1 tablet by mouth every 8 (eight) hours as needed (pain not relieved by indomethacine). Take with stool softener, no extra tylenol 12/01/14   Thao P Le, DO  indomethacin (INDOCIN) 50 MG capsule Take 1 capsule (50 mg total) by mouth 3 (three) times daily with meals. 12/01/14   Thao P Le, DO  lisinopril (PRINIVIL,ZESTRIL) 40 MG tablet Take 1 tablet (40 mg total) by mouth daily. 04/19/14   Shawnee Knapp, MD  metoprolol succinate (TOPROL-XL) 50 MG 24 hr tablet Take 1 tablet (50 mg total) by mouth daily. 04/19/14   Laurey Arrow  Brigitte Pulse, MD  pravastatin (PRAVACHOL) 40 MG tablet Take 1 tablet (40 mg total) by mouth daily. Pt needs labs for further refills 04/19/14   Shawnee Knapp, MD  predniSONE (DELTASONE) 20 MG tablet Take 2 tablets daily with meals in the morning. 12/03/14   Jaynee Eagles, PA-C  sildenafil (VIAGRA) 100 MG tablet Take 0.5-1 tablets (50-100 mg total) by mouth daily as needed for erectile dysfunction. 09/07/13   Wendie Agreste, MD   BP 162/100 mmHg  Pulse 94  Resp 26  SpO2 98%   Physical Exam  Constitutional: He is oriented to person, place, and time. He appears well-developed and well-nourished.  HENT:  Head: Normocephalic and atraumatic.  Right Ear: Tympanic membrane,  external ear and ear canal normal.  Left Ear: Tympanic membrane, external ear and ear canal normal.  Nose: Nose normal.  Mouth/Throat: Uvula is midline, oropharynx is clear and moist and mucous membranes are normal.  Eyes: Conjunctivae, EOM and lids are normal. Pupils are equal, round, and reactive to light.  Neck: Normal range of motion. Neck supple.  Cardiovascular: Normal rate and regular rhythm.   No murmur heard. Pulmonary/Chest: Effort normal and breath sounds normal. No respiratory distress. He has no wheezes. He has no rales.  Abdominal: Soft. There is no tenderness.  Musculoskeletal: Normal range of motion. He exhibits no edema or tenderness.       Cervical back: He exhibits normal range of motion, no tenderness and no bony tenderness.  Neurological: He is alert and oriented to person, place, and time. He has normal reflexes. He exhibits normal muscle tone. GCS eye subscore is 4. GCS verbal subscore is 1. GCS motor subscore is 4.  Unable to perform full neuro exam due to deficits.   Skin: Skin is warm and dry.  Psychiatric: He has a normal mood and affect.  Nursing note and vitals reviewed.   ED Course  Procedures (including critical care time) Labs Review Labs Reviewed  CBC - Abnormal; Notable for the following:    RBC 6.16 (*)    MCV 72.6 (*)    MCH 24.4 (*)    RDW 16.8 (*)    All other components within normal limits  I-STAT CHEM 8, ED - Abnormal; Notable for the following:    Potassium 3.3 (*)    Glucose, Bld 168 (*)    Calcium, Ion 1.05 (*)    All other components within normal limits  CBG MONITORING, ED - Abnormal; Notable for the following:    Glucose-Capillary 166 (*)    All other components within normal limits  PROTIME-INR  APTT  DIFFERENTIAL  ETHANOL  COMPREHENSIVE METABOLIC PANEL  URINE RAPID DRUG SCREEN (HOSP PERFORMED)  URINALYSIS, ROUTINE W REFLEX MICROSCOPIC  I-STAT TROPOININ, ED    Imaging Review Ct Head Wo Contrast  12/08/2014   CLINICAL  DATA:  Altered mental status.  EXAM: CT HEAD WITHOUT CONTRAST  TECHNIQUE: Contiguous axial images were obtained from the base of the skull through the vertex without intravenous contrast.  COMPARISON:  CT scan of November 04, 2009.  FINDINGS: Bony calvarium appears intact. Mild diffuse cortical atrophy is noted. Mild chronic ischemic white matter disease is noted No mass effect or midline shift is noted. Ventricular size is within normal limits. There is no evidence of mass lesion, hemorrhage or acute infarction.  IMPRESSION: Mild diffuse cortical atrophy. Mild chronic ischemic white matter disease. No acute intracranial abnormality seen.   Electronically Signed   By: Dionne Ano.D.  On: 12/08/2014 07:28     EKG Interpretation   Date/Time:  Monday December 08 2014 07:01:31 EST Ventricular Rate:  94 PR Interval:  68 QRS Duration: 84 QT Interval:  353 QTC Calculation: 441 R Axis:   -17 Text Interpretation:  Sinus rhythm Atrial premature complex Short PR  interval Probable left atrial enlargement Borderline left axis deviation  RSR' in V1 or V2, probably normal variant J point elevation similar to  prior EKGs Baseline wander in lead(s) V1 No significant change since last  tracing Confirmed by WARD,  DO, KRISTEN [id number]) on 12/08/2014 7:11:51 AM       7:02 AM Patient seen and examined. Discussed with Dr. Leonides Schanz prior to going off shift. Pt currently stable. Work-up initiated. Pt reported to EMS that he was having trouble speaking starting at [place]. He lives alone. No code stroke as LSN cannot be verified.     Vital signs reviewed and are as follows: BP 162/100 mmHg  Pulse 94  Resp 26  SpO2 98%  7:47 AM Dr. Zenia Resides has seen patient. I have spoken with neuro hospitalist. Dr. Janann Colonel, who will see.  11:58 AM Patient stable to this point. Pt seen by neurology. Suspect complicated migraine. MRI neg. Migraine cocktail ordered.    2:22 PM Patient ambulated but needed assistance. Patient given water to  drink by myself. He is able to take the cup and drink normally. He states his headache is improved. Anticipate d/c to home.   3:34 PM I spoke with patient again. He is talking, moving at baseline. Regarding trouble walking, he states his left foot is hurting due to gout. I asked if he feels comfortable going home. He states that he is and wants to go home. He sees Urgent care for follow-up.   Patient counseled to return if they have weakness in their arms or legs, slurred speech, trouble walking or talking, confusion, trouble with their balance, or if they have any other concerns. Patient verbalizes understanding and agrees with plan.    MDM   Final diagnoses:  Atypical migraine   Patient with HA and neurological features as described above. CT/MRI ruled out stroke. Patient much better after migraine cocktail with return to baseline. Remainder of work-up is reassuring. Patient able to be discharged to home.     Carlisle Cater, PA-C 12/08/14 1537

## 2014-12-16 ENCOUNTER — Ambulatory Visit (INDEPENDENT_AMBULATORY_CARE_PROVIDER_SITE_OTHER): Payer: BLUE CROSS/BLUE SHIELD | Admitting: Internal Medicine

## 2014-12-16 VITALS — BP 170/114 | HR 85 | Temp 98.0°F | Resp 16 | Ht 69.5 in | Wt 200.0 lb

## 2014-12-16 DIAGNOSIS — G43809 Other migraine, not intractable, without status migrainosus: Secondary | ICD-10-CM

## 2014-12-16 DIAGNOSIS — R479 Unspecified speech disturbances: Secondary | ICD-10-CM

## 2014-12-16 MED ORDER — KETOROLAC TROMETHAMINE 60 MG/2ML IM SOLN
60.0000 mg | Freq: Once | INTRAMUSCULAR | Status: AC
  Administered 2014-12-16: 60 mg via INTRAMUSCULAR

## 2014-12-16 MED ORDER — ONDANSETRON 4 MG PO TBDP
8.0000 mg | ORAL_TABLET | Freq: Once | ORAL | Status: AC
  Administered 2014-12-16: 8 mg via ORAL

## 2014-12-16 NOTE — Addendum Note (Signed)
Addended by: Leandrew Koyanagi on: 12/16/2014 06:19 PM   Modules accepted: Level of Service

## 2014-12-16 NOTE — Progress Notes (Addendum)
Subjective:    Patient ID: Ruben Camacho., male    DOB: [birth year], 59 y.o.   MRN: [id number]  HPI 59 year old male with a PMH of HTN, HLD, and Gout presents for ED follow up.  Patient was seen in the ED on 2/8 with complaints of weakness, difficulty with speech, and headache. Given reports of prior stroke and symptomatology, MRI was obtained to assess for stroke. MRI was negative.  Neurology was consulted and felt that this was nonphysiological vs complicated migraine.  Patient was treated with migraine cocktail and had significant improvement in headache.   Patient presents today for follow up.  Patient continues to endorse migraine headache.  He reports it is located behind his eyes.  He endorses associated photophobia and mild nausea.   Pain is currently 10/10 in severity.  No interventions tried.  His headache recurred with sudden onset on Sunday, 2 days ago. He has a past history of migraine headache for many years although never very severe or difficult to clear as in the recent past  Patient also continues to endorse weakness and difficulty with speech.  Patient reports left leg weakness which he states is worsening.  He also states that he has difficultly getting his words out and states that his speech is slowed. He works as a Pharmacist, hospital at [employer] and has been unable to work since the onset of these headaches.  Additionally, patient with recent gout flare which was treated with Prednisone and Colchicine.  He is still taking the colchicine but is still having pain of the left ankle and left great toe. This pain is not of great significance today.    PMH reviewed.   Past Medical History  Diagnosis Date  . Hypertension   . Hyperlipidemia   . Stroke 07/01/2012    L sided weakness, slurred speech.  Admission [facility].    . Gout     1-2 exacerbations per year.  Takes Advil PRN for pain.   Review of Systems  Constitutional: Positive for appetite change. Negative  for fever and chills.  Eyes: Positive for photophobia. Negative for visual disturbance.  Respiratory: Negative for chest tightness and shortness of breath.   Cardiovascular: Negative for chest pain.  Musculoskeletal: Positive for arthralgias.  Neurological: Positive for dizziness, speech difficulty and weakness.      Objective:   Physical Exam Filed Vitals:   12/16/14 0833  BP: 170/114  Pulse: 85  Temp: 98 F (36.7 C)  Resp: 16   Exam: General: appears uncomfortable with hands covering eyes. HEENT: NCAT. Oropharynx clear.  Constricted pupils bilateral with photophobia.  EOMI.  Cardiovascular: RRR. No murmurs, rubs, or gallops. Respiratory: CTAB. No rales, rhonchi, or wheeze. Abdomen: soft, nontender, nondistended. Extremities: No LE edema noted.  Left foot/ankle - pain with ROM and palpation of of L great toe. Full ROM of ankle. No erythema or warmth on exam.  Skin: Warm, dry, intact. Neuro: CN 2-12 intact.  Sensation intact.  3/5 muscle strength in all extremities. 2+ Patellar, achilles, brachioradialis, & biceps reflexes.      Assessment & Plan:   Weakness/Speech abnormality/Migraine - Recent ED visit with negative MRI. - Exam with no focal neurological deficits.  - Treating migraine today with Toradol and SL Zofran.  Patient with mild improvement with migraine treatment.  - Will refer to neurology vs headache clinic.   Left ankle/great toe pain - Continue colchicine. - Needs follow up regarding this in light of other issues today.  Uncontrolled hypertension - Needs rechecking when pain is controlled/advised to return in 48 hours.  25 minutes were spent face-to-face with the patient during this encounter and over half of that time was spent on counseling and coordination of care. This did not include the extra time spent evaluating him after intervention with Toradol and Zofran.  [place] PGY-3  Attending physician statement: I evaluated the  patient with this resident physician, examined him and made my own assessment, decided upon the therapy and reexamined the patient after administration of Toradol and Zofran. If his episodes of weakness and speech change are associated with his migraine phenomena as suggested by the neurology hospitalist, then he needs a method for prevention as well as treatment and we will refer him for further headache assessment by neurology to choose the most appropriate one. He needs primary care follow-up for all his health maintenance issues and for treatment of hypertension and gout. Appointment will be made for follow-up later this week. Further assessment of nonphysiological cause as suggested by neurology consult would also be made at this time Frankey Shown.D.

## 2014-12-18 NOTE — Progress Notes (Signed)
Pt requested FMLA papers to be faxed (number on forms) ASAP when they are done.

## 2014-12-19 ENCOUNTER — Telehealth: Payer: Self-pay

## 2014-12-19 NOTE — Telephone Encounter (Signed)
Patient came to the walk in to drop off a form to filled out by Dr. Laney Pastor. Patient was just informed today by his employer that if the form isn't turned in today he will be terminated. Patient was informed that the turn around time for paper work is 5-7 business days. Patient states he would appreciate if it could be completed today. Please call patient [phone]

## 2014-12-19 NOTE — Telephone Encounter (Signed)
Paperwork received placed in Dr. Ninfa Meeker box via Ander Purpura, for completion 5-7 business days.

## 2014-12-23 ENCOUNTER — Telehealth: Payer: Self-pay

## 2014-12-23 DIAGNOSIS — G43109 Migraine with aura, not intractable, without status migrainosus: Secondary | ICD-10-CM

## 2014-12-23 DIAGNOSIS — Z0271 Encounter for disability determination: Secondary | ICD-10-CM

## 2014-12-23 NOTE — Telephone Encounter (Signed)
Ref to [facility] neuro 1st available  Complicated migraine Ok to call in indocin 50mg  tid prn HA #30 if needed

## 2014-12-23 NOTE — Telephone Encounter (Signed)
Pt states the Dr told him if he was still having migraines we would call him something else in. Please call pt at [place]

## 2014-12-23 NOTE — Telephone Encounter (Signed)
PPW scanned into epic, faxed to [id number], pt notified

## 2014-12-23 NOTE — Telephone Encounter (Signed)
Weakness/Speech abnormality/Migraine - Recent ED visit with negative MRI. - Exam with no focal neurological deficits.  - Treating migraine today with Toradol and SL Zofran. Patient with mild improvement with migraine treatment.  - Will refer to neurology vs headache clinic.   Should we refer her to a neurologist?

## 2014-12-24 MED ORDER — INDOMETHACIN 50 MG PO CAPS: 50.0000 mg | ORAL_CAPSULE | Freq: Three times a day (TID) | ORAL | Status: DC | PRN

## 2014-12-24 NOTE — Telephone Encounter (Signed)
Spoke with pt, advised Rx sent in to [facility] and informed him that we would put in a referral to a neurologist. Pt agreed and understood.

## 2015-01-08 ENCOUNTER — Ambulatory Visit (INDEPENDENT_AMBULATORY_CARE_PROVIDER_SITE_OTHER): Payer: BC Managed Care – PPO | Admitting: Neurology

## 2015-01-08 ENCOUNTER — Encounter: Payer: Self-pay | Admitting: Neurology

## 2015-01-08 VITALS — BP 172/103 | HR 74 | Ht 69.5 in | Wt 199.0 lb

## 2015-01-08 DIAGNOSIS — G43019 Migraine without aura, intractable, without status migrainosus: Secondary | ICD-10-CM | POA: Diagnosis not present

## 2015-01-08 MED ORDER — NORTRIPTYLINE HCL 25 MG PO CAPS: ORAL_CAPSULE | ORAL | Status: DC

## 2015-01-08 MED ORDER — BUTALBITAL-APAP-CAFFEINE 50-325-40 MG PO TABS: 1.0000 | ORAL_TABLET | Freq: Four times a day (QID) | ORAL | Status: DC | PRN

## 2015-01-08 NOTE — Progress Notes (Signed)
PATIENT: Ruben Camacho. DOB: 1956/03/25  HISTORICAL  Ruben Camacho. is a 59 years old right-handed male, referred by his primary care physician Dr. Laney Pastor for evaluation of frequent headaches,  He has history of hypertension, hyperlipidemia, stroke in 2013, presented with dysarthria, left-sided weakness, take few months to recover.  He has excessive stress recently, just went through divorce, change to a different job, complains of increased frequency of headaches since November 2015, getting worse, to a daily basis since February 2016  He complains of upper nuchal, bilateral frontal, retro-orbital area pressure headaches, with associated light sensitivity, nauseous, lasting for a few hours, helped by sleep, he took hydrocodone, only benefit for a few hours.  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 ,  REVIEW OF SYSTEMS: Full 14 system review of systems performed and notable only for fatigue, spinning sensation, blurred vision, joints pain, achy muscles, confusion, headaches, numbness, weakness, slurred speech, dizziness, sleepiness, depression, anxiety, not enough sleep, decreased energy, change in appetite, disinteresting activities.  ALLERGIES: No Known Allergies  HOME MEDICATIONS: Current Outpatient Prescriptions  Medication Sig Dispense Refill  . amLODipine (NORVASC) 10 MG tablet Take 1 tablet (10 mg total) by mouth daily. 90 tablet 1  . aspirin 325 MG tablet Take 325 mg by mouth daily.    . hydrochlorothiazide (HYDRODIURIL) 25 MG tablet Take 1 tablet (25 mg total) by mouth daily. 90 tablet 1  . HYDROcodone-acetaminophen (NORCO) 5-325 MG per tablet Take 1 tablet by mouth every 8 (eight) hours as needed (pain not relieved by indomethacine). Take with stool softener, no extra tylenol 30 tablet 0  . lisinopril (PRINIVIL,ZESTRIL) 40 MG tablet Take 1 tablet (40 mg total) by  mouth daily. 90 tablet 1  . pravastatin (PRAVACHOL) 40 MG tablet Take 1 tablet (40 mg total) by mouth daily. Pt needs labs for further refills 90 tablet 1  . sildenafil (VIAGRA) 100 MG tablet Take 0.5-1 tablets (50-100 mg total) by mouth daily as needed for erectile dysfunction. 5 tablet 11     PAST MEDICAL HISTORY: Past Medical History  Diagnosis Date  . Hypertension   . Hyperlipidemia   . Stroke 07/01/2012    L sided weakness, slurred speech.  Admission [facility].    . Gout     1-2 exacerbations per year.  Takes Advil PRN for pain.  . Migraine     PAST SURGICAL HISTORY: Past Surgical History  Procedure Laterality Date  . Hernia repair    . Admission  07/01/2012    CVA (L sided weakness, slurred speech).  [facility].  . Colonoscopy  10/31/2008    no polyps.  Repeat in 10 years.  [place].    FAMILY HISTORY: Family History  Problem Relation Age of Onset  . Stroke Mother   . Hypertension Mother   . Hypertension Daughter   . Cancer Maternal Grandmother     lung  . Healthy Father     Died in motorcycle accident    SOCIAL HISTORY:  History   Social History  . Marital Status: Married    Spouse Name: N/A  . Number of Children: 2  . Years of Education: Masters   Occupational History  . PRINCIPAL    Social History Main Topics  . Smoking status: Never Smoker   . Smokeless tobacco: Not on file  . Alcohol Use: No     Comment: former  drinker  . Drug Use: No  . Sexual Activity:    Partners: Female   Other Topics Concern  . Not on file   Social History Narrative   Marital status: married      CHildren;  2 children; no grandchildren.      Lives at home alone.      Employment:  Pharmacist, hospital; principal in past.      Tobacco: none      Alcohol: weekends      Exercise:  Walking; weightlifting; basketball.   Right handed.   2 cups caffeine/day.     PHYSICAL EXAM   Filed Vitals:   01/08/15 0814  BP: 172/103  Pulse: 74  Height: 5' 9.5" (1.765 m)  Weight: 199 lb  (90.266 kg)    Not recorded      Body mass index is 28.98 kg/(m^2).  PHYSICAL EXAMNIATION:  Gen: NAD, conversant, well nourised, obese, well groomed                     Cardiovascular: Regular rate rhythm, no peripheral edema, warm, nontender. Eyes: Conjunctivae clear without exudates or hemorrhage Neck: Supple, no carotid bruise. Pulmonary: Clear to auscultation bilaterally   NEUROLOGICAL EXAM:  MENTAL STATUS: Speech:    Speech is normal; fluent and spontaneous with normal comprehension.  Cognition: Distressed    The patient is oriented to person, place, and time;     recent and remote memory intact;     language fluent;     normal attention, concentration,     fund of knowledge.  CRANIAL NERVES: CN II: Visual fields are full to confrontation. Fundoscopic exam is normal with sharp discs and no vascular changes. Venous pulsations are present bilaterally. Pupils are 4 mm and briskly reactive to light. Visual acuity is 20/20 bilaterally. CN III, IV, VI: extraocular movement are normal. No ptosis. CN V: Facial sensation is intact to pinprick in all 3 divisions bilaterally. Corneal responses are intact.  CN VII: Face is symmetric with normal eye closure and smile. CN VIII: Hearing is normal to rubbing fingers CN IX, X: Palate elevates symmetrically. Phonation is normal. CN XI: Head turning and shoulder shrug are intact CN XII: Tongue is midline with normal movements and no atrophy.  MOTOR: There is no pronator drift of out-stretched arms. Muscle bulk and tone are normal. Muscle strength is normal.   Shoulder abduction Shoulder external rotation Elbow flexion Elbow extension Wrist flexion Wrist extension Finger abduction Hip flexion Knee flexion Knee extension Ankle dorsi flexion Ankle plantar flexion  R 5 5 5 5 5 5 5 5 5 5 5 5   L 5 5 5 5 5 5 5 5 5 5 5 5     REFLEXES: Reflexes are 2+ and symmetric at the biceps, triceps, knees, and ankles. Plantar responses are  flexor.  SENSORY: Light touch, pinprick, position sense, and vibration sense are intact in fingers and toes.  COORDINATION: Rapid alternating movements and fine finger movements are intact. There is no dysmetria on finger-to-nose and heel-knee-shin. There are no abnormal or extraneous movements.   GAIT/STANCE: Posture is normal. Gait is steady with normal steps, base, arm swing, and turning. Heel and toe walking are normal. Tandem gait is normal.  Romberg is absent.   DIAGNOSTIC DATA (LABS, IMAGING, TESTING) - I reviewed patient records, labs, notes, testing and imaging myself where available.  Lab Results  Component Value Date   WBC 6.7 12/08/2014   HGB 16.0 12/08/2014   HCT 47.0 12/08/2014  MCV 72.6* 12/08/2014   PLT 186 12/08/2014      Component Value Date/Time   NA 141 12/08/2014 0716   K 3.3* 12/08/2014 0716   CL 109 12/08/2014 0716   CO2 28 12/08/2014 0705   GLUCOSE 168* 12/08/2014 0716   BUN 20 12/08/2014 0716   CREATININE 1.00 12/08/2014 0716   CREATININE 1.07 12/01/2014 1037   CALCIUM 9.2 12/08/2014 0705   PROT 7.1 12/08/2014 0705   ALBUMIN 3.6 12/08/2014 0705   AST 26 12/08/2014 0705   ALT 26 12/08/2014 0705   ALKPHOS 51 12/08/2014 0705   BILITOT 0.4 12/08/2014 0705   GFRNONAA 75* 12/08/2014 0705   GFRNONAA 76 12/01/2014 1037   GFRAA 87* 12/08/2014 0705   GFRAA 88 12/01/2014 1037   Lab Results  Component Value Date   CHOL 179 04/19/2014   HDL 50 04/19/2014   LDLCALC 102* 04/19/2014   TRIG 133 04/19/2014   CHOLHDL 3.6 04/19/2014    ASSESSMENT AND PLAN  Raliegh Karpf. is a 59 y.o. male  presenting with frequent migraine headaches, in the setting of excessive stress, he also has history of hypertension, on 3 agents, hyperlipidemia, stroke in 2013, presented with left hemiparesis, we have reviewed MRI of the brain, there was mild periventricular small vessel disease,   1, add on preventive medications nortriptyline, titrating to 25 mg 2 tablets  every night 2, Fioricet as needed, limit the use to less than 2-3 times each week 3. Return to clinic in 2-3 weeks,    Marcial Pacas, M.D. Ph.D.  [facility] [address] [place], [place] [address] Ph: [phone] Fax: [phone]

## 2015-01-09 ENCOUNTER — Ambulatory Visit: Payer: Self-pay | Admitting: Family Medicine

## 2015-01-13 ENCOUNTER — Institutional Professional Consult (permissible substitution): Payer: BLUE CROSS/BLUE SHIELD | Admitting: Neurology

## 2015-02-15 ENCOUNTER — Other Ambulatory Visit: Payer: Self-pay | Admitting: Family Medicine

## 2015-03-13 DIAGNOSIS — Z0271 Encounter for disability determination: Secondary | ICD-10-CM

## 2015-03-14 ENCOUNTER — Telehealth: Payer: Self-pay

## 2015-03-14 NOTE — Telephone Encounter (Signed)
Pt called in and states he needs a refill for Lisinopril. 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 He would like it called in to [facility] on [address]. He can be reached [phone]. Thank you

## 2015-03-16 MED ORDER — HYDROCHLOROTHIAZIDE 25 MG PO TABS: 25.0000 mg | ORAL_TABLET | Freq: Every day | ORAL | Status: DC

## 2015-03-16 NOTE — Telephone Encounter (Signed)
Rx sent in

## 2015-03-20 DIAGNOSIS — Z0289 Encounter for other administrative examinations: Secondary | ICD-10-CM

## 2015-05-27 ENCOUNTER — Ambulatory Visit (INDEPENDENT_AMBULATORY_CARE_PROVIDER_SITE_OTHER): Payer: Self-pay | Admitting: Family Medicine

## 2015-05-27 VITALS — BP 132/92 | HR 94 | Temp 98.8°F | Resp 18 | Ht 69.5 in | Wt 195.6 lb

## 2015-05-27 DIAGNOSIS — I10 Essential (primary) hypertension: Secondary | ICD-10-CM

## 2015-05-27 DIAGNOSIS — Z0000 Encounter for general adult medical examination without abnormal findings: Secondary | ICD-10-CM

## 2015-05-27 NOTE — Progress Notes (Signed)
   Subjective:    Patient ID: Ruben Camacho., male    DOB: 02-23-1956, 59 y.o.   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 He has been doing well and has not had migraines recently since his stress level has significantly decreased. He has been working out and trying to eat healthier.   Past Medical History  Diagnosis Date  . Hypertension   . Hyperlipidemia   . Stroke 07/01/2012    L sided weakness, slurred speech.  Admission [facility].    . Gout     1-2 exacerbations per year.  Takes Advil PRN for pain.  . Migraine    Past Surgical History  Procedure Laterality Date  . Hernia repair    . Admission  07/01/2012    CVA (L sided weakness, slurred speech).  [facility].  . Colonoscopy  10/31/2008    no polyps.  Repeat in 10 years.  [place].   Family History  Problem Relation Age of Onset  . Stroke Mother   . Hypertension Mother   . Hypertension Daughter   . Cancer Maternal Grandmother     lung  . Healthy Father     Died in motorcycle accident  Medications, allergies, past medical history, surgical history, family history, social history and problem list reviewed and updated.  Review of Systems  Constitutional: Negative for fever and unexpected weight change.  Respiratory: Negative for cough and shortness of breath.   Cardiovascular: Negative for chest pain.  Musculoskeletal: Negative for back pain and neck pain.  Neurological: Negative for headaches.      Objective:   Physical Exam  Constitutional: He is oriented to person, place, and time. He appears well-developed and well-nourished.  HENT:  Head: Normocephalic and atraumatic.  Right Ear: Tympanic membrane, external ear and ear canal normal.  Left Ear: Tympanic membrane, external ear and ear canal normal.  Nose: Nose normal.  Mouth/Throat: Oropharynx is clear and moist.  Eyes: Conjunctivae are normal. Pupils are equal, round, and  reactive to light.  Neck: Normal range of motion. Neck supple.  Cardiovascular: Normal rate, regular rhythm and normal heart sounds.   Pulmonary/Chest: Effort normal and breath sounds normal.  Musculoskeletal: Normal range of motion. He exhibits no edema or tenderness.  Neurological: He is alert and oriented to person, place, and time.  Skin: Skin is warm and dry.  Psychiatric: He has a normal mood and affect. His behavior is normal. Judgment and thought content normal.  Vitals reviewed.  BP 132/92 mmHg  Pulse 94  Temp(Src) 98.8 F (37.1 C) (Oral)  Resp 18  Ht 5' 9.5" (1.765 m)  Wt 195 lb 9.6 oz (88.724 kg)  BMI 28.48 kg/m2  SpO2 98% Wt Readings from Last 3 Encounters:  05/27/15 195 lb 9.6 oz (88.724 kg)  01/08/15 199 lb (90.266 kg)  12/16/14 200 lb (90.719 kg)       Assessment & Plan:  1. 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 Essential hypertension - continue current meds - follow up as scheduled  Clarene Reamer, FNP-BC  [facility]  05/27/2015 11:47 AM

## 2015-05-27 NOTE — Progress Notes (Signed)

## 2015-05-30 ENCOUNTER — Other Ambulatory Visit: Payer: Self-pay | Admitting: Urgent Care

## 2015-08-11 ENCOUNTER — Ambulatory Visit (INDEPENDENT_AMBULATORY_CARE_PROVIDER_SITE_OTHER): Payer: BC Managed Care – PPO | Admitting: Family Medicine

## 2015-08-11 VITALS — BP 130/90 | HR 82 | Temp 98.8°F | Resp 16 | Ht 69.5 in | Wt 197.0 lb

## 2015-08-11 DIAGNOSIS — J029 Acute pharyngitis, unspecified: Secondary | ICD-10-CM

## 2015-08-11 DIAGNOSIS — R05 Cough: Secondary | ICD-10-CM

## 2015-08-11 DIAGNOSIS — I10 Essential (primary) hypertension: Secondary | ICD-10-CM

## 2015-08-11 DIAGNOSIS — R059 Cough, unspecified: Secondary | ICD-10-CM

## 2015-08-11 LAB — POCT RAPID STREP A (OFFICE): Rapid Strep A Screen: NEGATIVE

## 2015-08-11 MED ORDER — AMOXICILLIN 500 MG PO CAPS: 500.0000 mg | ORAL_CAPSULE | Freq: Three times a day (TID) | ORAL | Status: DC

## 2015-08-11 MED ORDER — HYDROCODONE-HOMATROPINE 5-1.5 MG/5ML PO SYRP: 5.0000 mL | ORAL_SOLUTION | Freq: Every evening | ORAL | Status: DC | PRN

## 2015-08-11 MED ORDER — MAGIC MOUTHWASH W/LIDOCAINE: ORAL | Status: DC

## 2015-08-11 NOTE — Progress Notes (Signed)
Chief Complaint:  Chief Complaint  Patient presents with  . Cough    x 5 days  . Sore Throat    x 5 day  . Diarrhea    x 2 days  . Headache    x 5 days  . Fever    on saturday    HPI: Ruben Camacho. is a 59 y.o. male who reports to [facility] today complaining of 5 day history of sore throat, fever was subjective , he is around children, he is a Pharmacist, hospital, 4th grade.  There has been a lot of sick children. Has had poor appetitie. He has had nonbloody, stopped since Today. No rashes. Has had cough, white productive sputum.Nonsmoker. He has had poor PO and also weakness.  Hx of CVA, HTN, XOL. + HA and nonbloody diarrhea, resolved loose stools currently. Has tried otc meds without releif.  .   Past Medical History  Diagnosis Date  . Hypertension   . Hyperlipidemia   . Stroke ([place]) 07/01/2012    L sided weakness, slurred speech.  Admission [facility].    . Gout     1-2 exacerbations per year.  Takes Advil PRN for pain.  . Migraine    Past Surgical History  Procedure Laterality Date  . Hernia repair    . Admission  07/01/2012    CVA (L sided weakness, slurred speech).  [facility].  . Colonoscopy  10/31/2008    no polyps.  Repeat in 10 years.  [place].   Social History   Social History  . Marital Status: Married    Spouse Name: N/A  . Number of Children: 2  . Years of Education: Masters   Occupational History  . PRINCIPAL    Social History Main Topics  . Smoking status: Never Smoker   . Smokeless tobacco: None  . Alcohol Use: No     Comment: former drinker  . Drug Use: No  . Sexual Activity:    Partners: Female   Other Topics Concern  . None   Social History Narrative   Marital status: married      CHildren;  2 children; no grandchildren.      Lives at home alone.      Employment:  Pharmacist, hospital; principal in past.      Tobacco: none      Alcohol: weekends      Exercise:  Walking; weightlifting; basketball.   Right handed.   2 cups caffeine/day.   Family  History  Problem Relation Age of Onset  . Stroke Mother   . Hypertension Mother   . Hypertension Daughter   . Cancer Maternal Grandmother     lung  . Healthy Father     Died in motorcycle accident   No Known Allergies Prior to Admission medications   Medication Sig Start Date End Date Taking? Authorizing Provider  aspirin 81 MG tablet Take 81 mg by mouth daily.   Yes Historical Provider, MD  hydrochlorothiazide (HYDRODIURIL) 25 MG tablet Take 1 tablet (25 mg total) by mouth daily. 03/16/15  Yes Leandrew Koyanagi, MD  lisinopril (PRINIVIL,ZESTRIL) 40 MG tablet TAKE 1 TABLET BY MOUTH EVERY DAY. 05/31/15  Yes Bennett Scrape V, PA-C  aspirin 325 MG tablet Take 325 mg by mouth daily.    Historical Provider, MD     ROS: The patient denie night sweats, unintentional weight loss, chest pain, palpitations, wheezing, dyspnea on exertion, nausea, vomiting, abdominal pain, dysuria, hematuria, melena, numbness, weakness, or tingling.  All other systems have been reviewed and were otherwise negative with the exception of those mentioned in the HPI and as above.    PHYSICAL EXAM: Filed Vitals:   08/11/15 0927  BP: 130/90  Pulse:   Temp:   Resp:    Body mass index is 28.68 kg/(m^2).   General: Alert, no acute distress HEENT:  Normocephalic, atraumatic, oropharynx patent. EOMI, PERRLA + erythematous throat. TM normal, neg sinus tenderness,no exudates Cardiovascular:  Regular rate and rhythm, no rubs murmurs or gallops.  No Carotid bruits, radial pulse intact. No pedal edema.  Respiratory: Clear to auscultation bilaterally.  No wheezes, rales, or rhonchi.  No cyanosis, no use of accessory musculature Abdominal: No organomegaly, abdomen is soft and non-tender, positive bowel sounds. No masses. Skin: No rashes. Neurologic: Facial musculature symmetric. Psychiatric: Patient acts appropriately throughout our interaction. Lymphatic: No cervical or submandibular lymphadenopathy Musculoskeletal:  Gait intact. No edema, tenderness   LABS: Results for orders placed or performed in visit on 08/11/15  POCT rapid strep A  Result Value Ref Range   Rapid Strep A Screen Negative Negative     EKG/XRAY:   Primary read interpreted by Dr. Marin Comment at [facility].   ASSESSMENT/PLAN: Encounter Diagnoses  Name Primary?  . Essential hypertension   . Cough   . Acute pharyngitis, unspecified pharyngitis type Yes   RX amoxacillin, strep cx pending Rx magic mouthwash with lidocaine Rx hycodan prn  Fu prn   Gross sideeffects, risk and benefits, and alternatives of medications d/w patient. Patient is aware that all medications have potential sideeffects and we are unable to predict every sideeffect or drug-drug interaction that may occur.  Thao Le DO  08/11/2015 10:22 AM

## 2015-08-11 NOTE — Patient Instructions (Signed)
Pharyngitis Pharyngitis is redness, pain, and swelling (inflammation) of your pharynx.  CAUSES  Pharyngitis is usually caused by infection. Most of the time, these infections are from viruses (viral) and are part of a cold. However, sometimes pharyngitis is caused by bacteria (bacterial). Pharyngitis can also be caused by allergies. Viral pharyngitis may be spread from person to person by coughing, sneezing, and personal items or utensils (cups, forks, spoons, toothbrushes). Bacterial pharyngitis may be spread from person to person by more intimate contact, such as kissing.  SIGNS AND SYMPTOMS  Symptoms of pharyngitis include:   Sore throat.   Tiredness (fatigue).   Low-grade fever.   Headache.  Joint pain and muscle aches.  Skin rashes.  Swollen lymph nodes.  Plaque-like film on throat or tonsils (often seen with bacterial pharyngitis). DIAGNOSIS  Your health care provider will ask you questions about your illness and your symptoms. Your medical history, along with a physical exam, is often all that is needed to diagnose pharyngitis. Sometimes, a rapid strep test is done. Other lab tests may also be done, depending on the suspected cause.  TREATMENT  Viral pharyngitis will usually get better in 3-4 days without the use of medicine. Bacterial pharyngitis is treated with medicines that kill germs (antibiotics).  HOME CARE INSTRUCTIONS   Drink enough water and fluids to keep your urine clear or pale yellow.   Only take over-the-counter or prescription medicines as directed by your health care provider:   If you are prescribed antibiotics, make sure you finish them even if you start to feel better.   Do not take aspirin.   Get lots of rest.   Gargle with 8 oz of salt water ( tsp of salt per 1 qt of water) as often as every 1-2 hours to soothe your throat.   Throat lozenges (if you are not at risk for choking) or sprays may be used to soothe your throat. SEEK MEDICAL  CARE IF:   You have large, tender lumps in your neck.  You have a rash.  You cough up green, yellow-brown, or bloody spit. SEEK IMMEDIATE MEDICAL CARE IF:   Your neck becomes stiff.  You drool or are unable to swallow liquids.  You vomit or are unable to keep medicines or liquids down.  You have severe pain that does not go away with the use of recommended medicines.  You have trouble breathing (not caused by a stuffy nose). MAKE SURE YOU:   Understand these instructions.  Will watch your condition.  Will get help right away if you are not doing well or get worse.   This information is not intended to replace advice given to you by your health care provider. Make sure you discuss any questions you have with your health care provider.   Document Released: 10/17/2005 Document Revised: 08/07/2013 Document Reviewed: 06/24/2013 Elsevier Interactive Patient Education 2016 Elsevier Inc[employer]  

## 2015-08-13 LAB — CULTURE, GROUP A STREP

## 2015-08-17 ENCOUNTER — Telehealth: Payer: Self-pay | Admitting: Family Medicine

## 2015-08-17 NOTE — Telephone Encounter (Signed)
Lm that cx was + Group B but not A

## 2015-08-19 ENCOUNTER — Ambulatory Visit (INDEPENDENT_AMBULATORY_CARE_PROVIDER_SITE_OTHER): Payer: BC Managed Care – PPO | Admitting: Family Medicine

## 2015-08-19 ENCOUNTER — Ambulatory Visit (INDEPENDENT_AMBULATORY_CARE_PROVIDER_SITE_OTHER): Payer: BC Managed Care – PPO

## 2015-08-19 VITALS — BP 132/88 | HR 65 | Temp 98.4°F | Resp 16 | Ht 69.5 in | Wt 198.2 lb

## 2015-08-19 DIAGNOSIS — Z79899 Other long term (current) drug therapy: Secondary | ICD-10-CM

## 2015-08-19 DIAGNOSIS — M10072 Idiopathic gout, left ankle and foot: Secondary | ICD-10-CM

## 2015-08-19 DIAGNOSIS — M79672 Pain in left foot: Secondary | ICD-10-CM | POA: Diagnosis not present

## 2015-08-19 DIAGNOSIS — M109 Gout, unspecified: Secondary | ICD-10-CM

## 2015-08-19 LAB — POCT CBC
Granulocyte percent: 64.8 %G (ref 37–80)
HCT, POC: 43.8 % (ref 43.5–53.7)
HEMOGLOBIN: 14.6 g/dL (ref 14.1–18.1)
LYMPH, POC: 1.7 (ref 0.6–3.4)
MCH, POC: 23.8 pg — AB (ref 27–31.2)
MCHC: 33.2 g/dL (ref 31.8–35.4)
MCV: 71.7 fL — AB (ref 80–97)
MID (CBC): 0.6 (ref 0–0.9)
MPV: 8.8 fL (ref 0–99.8)
POC Granulocyte: 4.2 (ref 2–6.9)
POC LYMPH PERCENT: 26 %L (ref 10–50)
POC MID %: 9.2 % (ref 0–12)
Platelet Count, POC: 203 10*3/uL (ref 142–424)
RBC: 6.11 M/uL (ref 4.69–6.13)
RDW, POC: 15 %
WBC: 6.5 10*3/uL (ref 4.6–10.2)

## 2015-08-19 LAB — BASIC METABOLIC PANEL
BUN: 24 mg/dL (ref 7–25)
CO2: 29 mmol/L (ref 20–31)
Calcium: 9.6 mg/dL (ref 8.6–10.3)
Chloride: 103 mmol/L (ref 98–110)
Creat: 1.28 mg/dL (ref 0.70–1.33)
Glucose, Bld: 117 mg/dL — ABNORMAL HIGH (ref 65–99)
POTASSIUM: 4.2 mmol/L (ref 3.5–5.3)
SODIUM: 142 mmol/L (ref 135–146)

## 2015-08-19 LAB — POCT SEDIMENTATION RATE: POCT SED RATE: 23 mm/hr — AB (ref 0–22)

## 2015-08-19 LAB — URIC ACID: Uric Acid, Serum: 7.8 mg/dL (ref 4.0–7.8)

## 2015-08-19 MED ORDER — INDOMETHACIN 50 MG PO CAPS: 50.0000 mg | ORAL_CAPSULE | Freq: Three times a day (TID) | ORAL | Status: DC

## 2015-08-19 MED ORDER — PREDNISONE 20 MG PO TABS: ORAL_TABLET | ORAL | Status: DC

## 2015-08-19 NOTE — Patient Instructions (Signed)
You can reduce your uric acid level by avoiding red meat, saturated fats (dairy fats, butter fats, animal fats), high fructose corn syrup, and beer and drinking more water.  Gout Gout is an inflammatory arthritis caused by a buildup of uric acid crystals in the joints. Uric acid is a chemical that is normally present in the blood. When the level of uric acid in the blood is too high it can form crystals that deposit in your joints and tissues. This causes joint redness, soreness, and swelling (inflammation). Repeat attacks are common. Over time, uric acid crystals can form into masses (tophi) near a joint, destroying bone and causing disfigurement. Gout is treatable and often preventable. CAUSES  The disease begins with elevated levels of uric acid in the blood. Uric acid is produced by your body when it breaks down a naturally found substance called purines. Certain foods you eat, such as meats and fish, contain high amounts of purines. Causes of an elevated uric acid level include:  Being passed down from parent to child (heredity).  Diseases that cause increased uric acid production (such as obesity, psoriasis, and certain cancers).  Excessive alcohol use.  Diet, especially diets rich in meat and seafood.  Medicines, including certain cancer-fighting medicines (chemotherapy), water pills (diuretics), and aspirin.  Chronic kidney disease. The kidneys are no longer able to remove uric acid well.  Problems with metabolism. Conditions strongly associated with gout include:  Obesity.  High blood pressure.  High cholesterol.  Diabetes. Not everyone with elevated uric acid levels gets gout. It is not understood why some people get gout and others do not. Surgery, joint injury, and eating too much of certain foods are some of the factors that can lead to gout attacks. SYMPTOMS   An attack of gout comes on quickly. It causes intense pain with redness, swelling, and warmth in a  joint.  Fever can occur.  Often, only one joint is involved. Certain joints are more commonly involved:  Base of the big toe.  Knee.  Ankle.  Wrist.  Finger. Without treatment, an attack usually goes away in a few days to weeks. Between attacks, you usually will not have symptoms, which is different from many other forms of arthritis. DIAGNOSIS  Your caregiver will suspect gout based on your symptoms and exam. In some cases, tests may be recommended. The tests may include:  Blood tests.  Urine tests.  X-rays.  Joint fluid exam. This exam requires a needle to remove fluid from the joint (arthrocentesis). Using a microscope, gout is confirmed when uric acid crystals are seen in the joint fluid. TREATMENT  There are two phases to gout treatment: treating the sudden onset (acute) attack and preventing attacks (prophylaxis).  Treatment of an Acute Attack.  Medicines are used. These include anti-inflammatory medicines or steroid medicines.  An injection of steroid medicine into the affected joint is sometimes necessary.  The painful joint is rested. Movement can worsen the arthritis.  You may use warm or cold treatments on painful joints, depending which works best for you.  Treatment to Prevent Attacks.  If you suffer from frequent gout attacks, your caregiver may advise preventive medicine. These medicines are started after the acute attack subsides. These medicines either help your kidneys eliminate uric acid from your body or decrease your uric acid production. You may need to stay on these medicines for a very long time.  The early phase of treatment with preventive medicine can be associated with an increase in acute  gout attacks. For this reason, during the first few months of treatment, your caregiver may also advise you to take medicines usually used for acute gout treatment. Be sure you understand your caregiver's directions. Your caregiver may make several adjustments  to your medicine dose before these medicines are effective.  Discuss dietary treatment with your caregiver or dietitian. Alcohol and drinks high in sugar and fructose and foods such as meat, poultry, and seafood can increase uric acid levels. Your caregiver or dietitian can advise you on drinks and foods that should be limited. HOME CARE INSTRUCTIONS   Do not take aspirin to relieve pain. This raises uric acid levels.  Only take over-the-counter or prescription medicines for pain, discomfort, or fever as directed by your caregiver.  Rest the joint as much as possible. When in bed, keep sheets and blankets off painful areas.  Keep the affected joint raised (elevated).  Apply warm or cold treatments to painful joints. Use of warm or cold treatments depends on which works best for you.  Use crutches if the painful joint is in your leg.  Drink enough fluids to keep your urine clear or pale yellow. This helps your body get rid of uric acid. Limit alcohol, sugary drinks, and fructose drinks.  Follow your dietary instructions. Pay careful attention to the amount of protein you eat. Your daily diet should emphasize fruits, vegetables, whole grains, and fat-free or low-fat milk products. Discuss the use of coffee, vitamin C, and cherries with your caregiver or dietitian. These may be helpful in lowering uric acid levels.  Maintain a healthy body weight. SEEK MEDICAL CARE IF:   You develop diarrhea, vomiting, or any side effects from medicines.  You do not feel better in 24 hours, or you are getting worse. SEEK IMMEDIATE MEDICAL CARE IF:   Your joint becomes suddenly more tender, and you have chills or a fever. MAKE SURE YOU:   Understand these instructions.  Will watch your condition.  Will get help right away if you are not doing well or get worse.   This information is not intended to replace advice given to you by your health care provider. Make sure you discuss any questions you have  with your health care provider.   Document Released: 10/14/2000 Document Revised: 11/07/2014 Document Reviewed: 05/30/2012 Elsevier Interactive Patient Education 2016 [employer].  There are some easy diet adjustments that you can make to lower your blood uric acid level and thereby hopefully never have to suffer from another gout flair (or at least less frequently).  You should avoid alcohol, drink plenty of water, and try to follow a "low purine" diet as your body produces uric acid when it breaks down prurines-substances that are found naturally in your body, as well as in certain foods such as organ meats, anchioves, herring, asparagus, and mushrooms. Also, increasing your diet in certain foods that may lower uric acid levels is a pretty safe way to decrease your likelihood of gout so consider drinking coffee (regular and/or decaf), eating fruits with Vitamin C in them such as citrus fruits, strawberries, broccoli,  brussel sprouts, papaya, and cantaloupe (though megadoses of vitamin C supplements may do the opposite and increase your body's uric acid levels), and/or eating more cherries and other dark-colored fruits, such as blackberries, blueberries, purple grapes and raspberries. In addition, getting plenty of vitamin A though yellow fruits, or dark green/yellow vegetables at least every other day is good.  Other general diet guidelines for people with gout who  need to lower their blood uric acid levels are as follows:  Drink 8 to 16 cups ( about 2 to 4 liters) of fluid each day, with at least half being water. Eat a moderate amount of protein, preferably from healthy sources, such as low-fat or fat-free dairy, tofu, eggs, and nut butters. Limit your daily intake of meat, fish, and poultry to 4 to 6 ounces. Avoid high fat meats and desserts. Decrease your intake of shellfish, beef, lamb, pork, eggs and cheese. Avoid drastic weight reduction or fasting.  If weight loss is desired lose it over a  period of several months.

## 2015-08-19 NOTE — Progress Notes (Signed)
Subjective:  This chart was scribed for Ruben Cheadle MD by [name], at [facility].  This patient was seen in room 1  and the patient's care was started at 8:26 AM.    Patient ID: Ruben Camacho., male    DOB: February 12, 1956, 59 y.o.   MRN: [id number]  HPI  HPI Comments: Ruben Camacho. is a 59 y.o. male who presents to the [facility] complaining of left ankle pain which started in his posterior heel and moved to anterior proximal mid foot which then radiated to mid MTP joints. Patient states he is able to walk on it when he first wakes up but by the end of the day, he is barely able to stand on it.  He denies any recent injury and states that the pain was so bad last night, he was not able to sleep.  Patient states that even the sheet rubbing on his foot gave him pain.  He has taken aleve (2, twice a day) but denies any relief. Patient is taking HCTZ and Lisinopril.  He states his last flare of gout was 1 year ago.  Patient has no other complaints today.   Past Medical History  Diagnosis Date  . Hypertension   . Hyperlipidemia   . Stroke ([place]) 07/01/2012    L sided weakness, slurred speech.  Admission [facility].    . Gout     1-2 exacerbations per year.  Takes Advil PRN for pain.  . Migraine     Current Outpatient Prescriptions on File Prior to Visit  Medication Sig Dispense Refill  . aspirin 325 MG tablet Take 325 mg by mouth daily.    Marland Kitchen aspirin 81 MG tablet Take 81 mg by mouth daily.    . hydrochlorothiazide (HYDRODIURIL) 25 MG tablet Take 1 tablet (25 mg total) by mouth daily. 90 tablet 0  . lisinopril (PRINIVIL,ZESTRIL) 40 MG tablet TAKE 1 TABLET BY MOUTH EVERY DAY. 90 tablet 1  . amoxicillin (AMOXIL) 500 MG capsule Take 1 capsule (500 mg total) by mouth 3 (three) times daily. (Patient not taking: Reported on 08/19/2015) 30 capsule 0  . HYDROcodone-homatropine (HYCODAN) 5-1.5 MG/5ML syrup Take 5 mLs by mouth at bedtime as  needed. (Patient not taking: Reported on 08/19/2015) 60 mL 0  . magic mouthwash w/lidocaine SOLN Swish, gargle and spit QID prn (Patient not taking: Reported on 08/19/2015) 120 mL 0   No current facility-administered medications on file prior to visit.    No Known Allergies    A little bit of warmth.     Review of Systems  Constitutional: Negative for fever and chills.  Gastrointestinal: Negative for nausea and vomiting.  Musculoskeletal: Positive for gait problem. Negative for neck pain and neck stiffness.       Objective:   Physical Exam  Constitutional: He appears well-developed and well-nourished. No distress.  HENT:  Head: Normocephalic and atraumatic.  Eyes: Conjunctivae and EOM are normal.  Cardiovascular: Normal rate.   Pulmonary/Chest: Effort normal. No respiratory distress.  Musculoskeletal:  Entire foot with 2+ pitting edema some mild erythema over the medial MTP, 2+ posterior tibialis and dorsalis pedis pulses. Tender to palp right over posterior aspect of talis.   Skin: Skin is warm and dry.  Psychiatric: He has a normal mood and affect. His behavior is normal.  Nursing note and vitals reviewed.   Filed Vitals:   08/19/15 0817  BP: 132/88  Pulse: 65  Temp: 98.4  F (36.9 C)  TempSrc: Oral  Resp: 16  Height: 5' 9.5" (1.765 m)  Weight: 198 lb 3.2 oz (89.903 kg)  SpO2: 98%    Results for orders placed or performed in visit on 08/19/15  POCT CBC  Result Value Ref Range   WBC 6.5 4.6 - 10.2 K/uL   Lymph, poc 1.7 0.6 - 3.4   POC LYMPH PERCENT 26.0 10 - 50 %L   MID (cbc) 0.6 0 - 0.9   POC MID % 9.2 0 - 12 %M   POC Granulocyte 4.2 2 - 6.9   Granulocyte percent 64.8 37 - 80 %G   RBC 6.11 4.69 - 6.13 M/uL   Hemoglobin 14.6 14.1 - 18.1 g/dL   HCT, POC 43.8 43.5 - 53.7 %   MCV 71.7 (A) 80 - 97 fL   MCH, POC 23.8 (A) 27 - 31.2 pg   MCHC 33.2 31.8 - 35.4 g/dL   RDW, POC 15.0 %   Platelet Count, POC 203 142 - 424 K/uL   MPV 8.8 0 - 99.8 fL    [facility]  (PRIMARY) x-ray report read by Dr. Brigitte Pulse.  Left foot: normal    Dg Foot Complete Left  08/19/2015  CLINICAL DATA:  Foot pain.  No known injury.  Initial evaluation. EXAM: LEFT FOOT - COMPLETE 3+ VIEW COMPARISON:  None. FINDINGS: No acute bony or joint abnormality identified. No evidence of fracture dislocation. Degenerative changes first MTP joint and first tarsometatarsal joint. IMPRESSION: Degenerative changes about the left great toe. No acute or focal bony abnormality otherwise noted. Evidence of erosive arthropathy. No evidence of fracture. Electronically Signed   By: Marcello Moores  Register   On: 08/19/2015 09:08    Assessment & Plan:   1. Left foot pain   2. Polypharmacy   3. Acute gout of left foot, unspecified cause    If no improvement, call patient a course of Prednisone.   Patient advised to use Tylenol as needed.  Do not use Indocin as the same time as Prednisone.  If you need additional pain control, please call for opiate.  Advised if gout becomes recurrent, would recommend changing HCTZ.    Orders Placed This Encounter  Procedures  . DG Foot Complete Left    Standing Status: Future     Number of Occurrences: 1     Standing Expiration Date: 08/18/2016    Order Specific Question:  Reason for Exam (SYMPTOM  OR DIAGNOSIS REQUIRED)    Answer:  no known injury, posterior heal/calcanial pain, suspect gout    Order Specific Question:  Preferred imaging location?    Answer:  External  . Uric Acid  . Basic metabolic panel    Order Specific Question:  Has the patient fasted?    Answer:  No  . POCT CBC  . POCT SEDIMENTATION RATE    Meds ordered this encounter  Medications  . indomethacin (INDOCIN) 50 MG capsule    Sig: Take 1 capsule (50 mg total) by mouth 3 (three) times daily with meals. For gout flair    Dispense:  60 capsule    Refill:  0  . predniSONE (DELTASONE) 20 MG tablet    Sig: Take 3 tabs po qd x 2d, 2 tabs po qd x 2d, 1 tab po qd x 2d    Dispense:  12 tablet     Refill:  0    I personally performed the services described in this documentation, which was scribed in my presence. The recorded  information has been reviewed and considered, and addended by me as needed.  Ruben Cheadle, MD MPH

## 2015-08-22 ENCOUNTER — Encounter: Payer: Self-pay | Admitting: Family Medicine

## 2015-08-28 ENCOUNTER — Encounter: Payer: Self-pay | Admitting: Radiology

## 2015-09-02 ENCOUNTER — Ambulatory Visit (INDEPENDENT_AMBULATORY_CARE_PROVIDER_SITE_OTHER): Payer: BC Managed Care – PPO | Admitting: Family Medicine

## 2015-09-02 VITALS — BP 140/80 | HR 74 | Temp 98.4°F | Resp 16 | Ht 68.5 in | Wt 196.4 lb

## 2015-09-02 DIAGNOSIS — R21 Rash and other nonspecific skin eruption: Secondary | ICD-10-CM | POA: Diagnosis not present

## 2015-09-02 DIAGNOSIS — W57XXXA Bitten or stung by nonvenomous insect and other nonvenomous arthropods, initial encounter: Secondary | ICD-10-CM

## 2015-09-02 MED ORDER — PREDNISONE 20 MG PO TABS: ORAL_TABLET | ORAL | Status: DC

## 2015-09-02 NOTE — Patient Instructions (Signed)
Bedbugs Bedbugs are tiny bugs that live in and around beds. They stay hidden during the day, and they come out at night and bite. Bedbugs need blood to live and grow. WHERE ARE BEDBUGS FOUND? Bedbugs can be found anywhere, whether a place is clean or dirty. They are most often found in places where many people come and go, such as hotels, shelters, dorms, and health care settings. It is also common for them to be found in homes where there are many birds or bats nearby. WHAT ARE [place]? A bedbug bite leaves a small red bump with a darker red dot in the middle. The bump may appear soon after a person is bitten or a day or more later. Bedbug bites usually do not hurt, but they may itch. Most people do not need treatment for bedbug bites. The bumps usually go away on their own in a few days. HOW DO I CHECK FOR BEDBUGS? Bedbugs are reddish-brown, oval, and flat. They range in size from 1 mm to 7 mm and they cannot fly. Look for bedbugs in these places:  On mattresses, bed frames, headboards, and box springs.  On drapes and curtains in bedrooms.  Under carpeting in bedrooms.  Behind electrical outlets.  Behind any wallpaper that is peeling.  Inside luggage. Also look for black or red spots or stains on or near the bed. Stains can come from bedbugs that have been crushed or from bedbug waste. WHAT SHOULD I DO IF I FIND BEDBUGS? When Traveling If you find bedbugs while traveling, check all of your possessions carefully before you bring them into your home. Consider throwing away anything that has bedbugs on it. At Home If you find bedbugs at home, your bedroom may need to be treated by a pest control expert. You may also need to throw away mattresses or luggage. To help keep bedbugs from coming back, consider taking these actions:  Put a plastic cover over your mattress.  Wash your clothes and bedding in water that is hotter than 120F (48.9C) and dry them on a hot setting. Bedbugs  are killed by high temperatures.  Vacuum often around the bed and in all of the cracks and crevices where the bugs might hide.  Carefully check all used furniture, bedding, or clothes that you bring into your home.  Eliminate bird nests and bat roosts that are near your home. In Your Bed If you find bedbugs in your bed, consider wearing pajamas that have long sleeves and pant legs. Bedbugs usually bite areas of the skin that are not covered.   This information is not intended to replace advice given to you by your health care provider. Make sure you discuss any questions you have with your health care provider.   Document Released: 11/19/2010 Document Revised: 03/03/2015 Document Reviewed: 10/13/2014 Elsevier Interactive Patient Education [employer].

## 2015-09-02 NOTE — Progress Notes (Signed)
This chart was scribed for Robyn Haber, MD by Moises Blood, medical scribe at [facility].The patient was seen in exam room 2 and the patient's care was started at 9:07 AM.  Patient ID: Ruben Camacho. MRN: [id number], DOB: 1956-02-04, 59 y.o. Date of Encounter: 09/02/2015  Primary Physician: Leandrew Koyanagi, MD  Chief Complaint:  Chief Complaint  Patient presents with  . Rash    rash all over and itching--exposed to scabies    HPI:  Ruben Prevatt. is a 59 y.o. male who presents to [facility] complaining of itching rash all over his body starting 2 days ago. He's been staying at a hotel recently.   Past Medical History  Diagnosis Date  . Hypertension   . Hyperlipidemia   . Stroke ([place]) 07/01/2012    L sided weakness, slurred speech.  Admission [facility].    . Gout     1-2 exacerbations per year.  Takes Advil PRN for pain.  . Migraine      Home Meds: Prior to Admission medications   Medication Sig Start Date End Date Taking? Authorizing Provider  aspirin 81 MG tablet Take 81 mg by mouth daily.   Yes Historical Provider, MD  hydrochlorothiazide (HYDRODIURIL) 25 MG tablet Take 1 tablet (25 mg total) by mouth daily. 03/16/15  Yes Leandrew Koyanagi, MD  indomethacin (INDOCIN) 50 MG capsule Take 1 capsule (50 mg total) by mouth 3 (three) times daily with meals. For gout flair 08/19/15  Yes Shawnee Knapp, MD  lisinopril (PRINIVIL,ZESTRIL) 40 MG tablet TAKE 1 TABLET BY MOUTH EVERY DAY. 05/31/15  Yes Bennett Scrape V, PA-C  aspirin 325 MG tablet Take 325 mg by mouth daily.    Historical Provider, MD  predniSONE (DELTASONE) 20 MG tablet Take 3 tabs po qd x 2d, 2 tabs po qd x 2d, 1 tab po qd x 2d Patient not taking: Reported on 09/02/2015 08/19/15   Shawnee Knapp, MD    Allergies: No Known Allergies  Social History   Social History  . Marital Status: Married    Spouse Name: N/A  . Number of Children: 2  . Years of Education:  Masters   Occupational History  . PRINCIPAL    Social History Main Topics  . Smoking status: Never Smoker   . Smokeless tobacco: Not on file  . Alcohol Use: No     Comment: former drinker  . Drug Use: No  . Sexual Activity:    Partners: Female   Other Topics Concern  . Not on file   Social History Narrative   Marital status: married      CHildren;  2 children; no grandchildren.      Lives at home alone.      Employment:  Pharmacist, hospital; principal in past.      Tobacco: none      Alcohol: weekends      Exercise:  Walking; weightlifting; basketball.   Right handed.   2 cups caffeine/day.     Review of Systems: Constitutional: negative for chills, fever, night sweats, weight changes, or fatigue  HEENT: negative for vision changes, hearing loss, congestion, rhinorrhea, ST, epistaxis, or sinus pressure Cardiovascular: negative for chest pain or palpitations Respiratory: negative for hemoptysis, wheezing, shortness of breath, or cough Abdominal: negative for abdominal pain, nausea, vomiting, diarrhea, or constipation Dermatological: positive for rash Neurologic: negative for headache, dizziness, or syncope All other systems reviewed and are otherwise negative with the  exception to those above and in the HPI.  Physical Exam: Blood pressure 140/80, pulse 74, temperature 98.4 F (36.9 C), temperature source Oral, resp. rate 16, height 5' 8.5" (1.74 m), weight 196 lb 6 oz (89.075 kg), SpO2 98 %., Body mass index is 29.42 kg/(m^2). General: Well developed, well nourished, in no acute distress. Head: Normocephalic, atraumatic, eyes without discharge, sclera non-icteric, nares are without discharge. Bilateral auditory canals clear, TM's are without perforation, pearly grey and translucent with reflective cone of light bilaterally. Oral cavity moist, posterior pharynx without exudate, erythema, peritonsillar abscess, or post nasal drip.  Neck: Supple. No thyromegaly. Full ROM. No  lymphadenopathy. Lungs: Clear bilaterally to auscultation without wheezes, rales, or rhonchi. Breathing is unlabored. Heart: RRR with S1 S2. No murmurs, rubs, or gallops appreciated. Abdomen: Soft, non-tender, non-distended with normoactive bowel sounds. No hepatomegaly. No rebound/guarding. No obvious abdominal masses. Msk:  Strength and tone normal for age. Extremities/Skin: Warm and dry. No clubbing or cyanosis. No edema. No rashes or suspicious lesions. Multiple wheals arms, legs, and neck Neuro: Alert and oriented X 3. Moves all extremities spontaneously. Gait is normal. CNII-XII grossly in tact. Psych:  Responds to questions appropriately with a normal affect.     ASSESSMENT AND PLAN:  59 y.o. year old male with  This chart was scribed in my presence and reviewed by me personally.    ICD-9-CM ICD-10-CM   1. Bedbug bite 919.4 T14.8 predniSONE (DELTASONE) 20 MG tablet    W57.XXXA       By signing my name below, I, Moises Blood, attest that this documentation has been prepared under the direction and in the presence of Robyn Haber, MD. Electronically Signed: Moises Blood, [place]. 09/02/2015 , 9:07 AM .  Signed, Robyn Haber, MD 09/02/2015 9:07 AM

## 2015-09-05 ENCOUNTER — Other Ambulatory Visit: Payer: Self-pay | Admitting: Family Medicine

## 2015-09-08 NOTE — Telephone Encounter (Signed)
Is patient having a gout flare?

## 2015-09-15 NOTE — Telephone Encounter (Deleted)
Is patient having a gout flare up?

## 2015-09-15 NOTE — Telephone Encounter (Signed)
Patient states he doesn't need Indocin

## 2015-09-28 ENCOUNTER — Encounter: Payer: Self-pay | Admitting: Internal Medicine

## 2015-10-11 ENCOUNTER — Ambulatory Visit (INDEPENDENT_AMBULATORY_CARE_PROVIDER_SITE_OTHER): Payer: BC Managed Care – PPO | Admitting: Urgent Care

## 2015-10-11 ENCOUNTER — Ambulatory Visit (INDEPENDENT_AMBULATORY_CARE_PROVIDER_SITE_OTHER): Payer: BC Managed Care – PPO

## 2015-10-11 VITALS — BP 177/111 | HR 66 | Temp 98.6°F | Resp 24 | Ht 69.0 in | Wt 199.4 lb

## 2015-10-11 DIAGNOSIS — Z8639 Personal history of other endocrine, nutritional and metabolic disease: Secondary | ICD-10-CM

## 2015-10-11 DIAGNOSIS — Z8739 Personal history of other diseases of the musculoskeletal system and connective tissue: Secondary | ICD-10-CM

## 2015-10-11 DIAGNOSIS — I10 Essential (primary) hypertension: Secondary | ICD-10-CM

## 2015-10-11 DIAGNOSIS — M25562 Pain in left knee: Secondary | ICD-10-CM | POA: Diagnosis not present

## 2015-10-11 DIAGNOSIS — R030 Elevated blood-pressure reading, without diagnosis of hypertension: Secondary | ICD-10-CM

## 2015-10-11 DIAGNOSIS — D649 Anemia, unspecified: Secondary | ICD-10-CM

## 2015-10-11 LAB — POCT CBC
GRANULOCYTE PERCENT: 52.7 % (ref 37–80)
HCT, POC: 42.2 % — AB (ref 43.5–53.7)
Hemoglobin: 13.6 g/dL — AB (ref 14.1–18.1)
Lymph, poc: 1.9 (ref 0.6–3.4)
MCH, POC: 23 pg — AB (ref 27–31.2)
MCHC: 32.3 g/dL (ref 31.8–35.4)
MCV: 71.1 fL — AB (ref 80–97)
MID (cbc): 0.4 (ref 0–0.9)
MPV: 8.8 fL (ref 0–99.8)
PLATELET COUNT, POC: 203 10*3/uL (ref 142–424)
POC GRANULOCYTE: 2.6 (ref 2–6.9)
POC LYMPH %: 38.9 % (ref 10–50)
POC MID %: 8.4 %M (ref 0–12)
RBC: 5.93 M/uL (ref 4.69–6.13)
RDW, POC: 14.3 %
WBC: 4.9 10*3/uL (ref 4.6–10.2)

## 2015-10-11 LAB — POCT URINALYSIS DIP (MANUAL ENTRY)
BILIRUBIN UA: NEGATIVE
Bilirubin, UA: NEGATIVE
Glucose, UA: NEGATIVE
LEUKOCYTES UA: NEGATIVE
Nitrite, UA: NEGATIVE
PH UA: 5.5
RBC UA: NEGATIVE
Spec Grav, UA: 1.025
Urobilinogen, UA: 0.2

## 2015-10-11 MED ORDER — AMLODIPINE BESYLATE 5 MG PO TABS: 5.0000 mg | ORAL_TABLET | Freq: Every day | ORAL | Status: DC

## 2015-10-11 NOTE — Progress Notes (Signed)
MRN: [id number] DOB: [birth year]  Subjective:   Ruben Camacho. is a 59 y.o. male with pmh of HTN presenting for chief complaint of Knee Pain  Reports 2 day history of worsening constant left knee pain. Pain is sharp, rated 8/10 in severity, worse with bending, walking, general movement. Has tried Advil with minimal relief. Denies redness, swelling, radiation of his knee pain, trauma. Denies history of knee surgeries, knee issues. Also, denies lightheadedness, dizziness, chronic headache, double vision, chest pain, shortness of breath, heart racing, palpitations, nausea, vomiting, abdominal pain, hematuria, lower leg swelling. Admits history of gout, has had it in his left foot and ankle. He has taken indomethacin with good results in the past.  Vivin has a current medication list which includes the following prescription(s): aspirin, hydrochlorothiazide, and lisinopril. Also has No Known Allergies.  Ruben Camacho  has a past medical history of Hypertension; Hyperlipidemia; Stroke ([place]) (07/01/2012); Gout; and Migraine. Also  has past surgical history that includes Hernia repair; Admission (07/01/2012); and Colonoscopy (10/31/2008).  Objective:   Vitals: BP 177/111 mmHg  Pulse 66  Temp(Src) 98.6 F (37 C) (Oral)  Resp 24  Ht 5\' 9"  (1.753 m)  Wt 199 lb 6 oz (90.436 kg)  BMI 29.43 kg/m2  SpO2 98%  Physical Exam  Constitutional: He is oriented to person, place, and time. He appears well-developed and well-nourished.  HENT:  Mouth/Throat: Oropharynx is clear and moist.  Eyes: Pupils are equal, round, and reactive to light. No scleral icterus.  Neck: Normal range of motion. Neck supple. No thyromegaly present.  Cardiovascular: Normal rate, regular rhythm and intact distal pulses.  Exam reveals no gallop and no friction rub.   No murmur heard. Pulmonary/Chest: No respiratory distress. He has no wheezes. He has no rales.  Abdominal: Soft. Bowel sounds are normal. He exhibits no distension and no  mass. There is no tenderness.  Musculoskeletal: He exhibits no edema.       Left knee: He exhibits normal range of motion, no swelling, no effusion, no ecchymosis, no deformity, no laceration, no erythema and normal patellar mobility. Tenderness (also with ROM testing) found. Medial joint line tenderness noted. No lateral joint line and no patellar tendon tenderness noted.       Legs: Neurological: He is alert and oriented to person, place, and time. He has normal reflexes. Coordination (walking with a limp, favoring his left knee) abnormal.  Skin: Skin is warm and dry. No rash noted. No erythema. No pallor.   Dg Knee Complete 4 Views Left  10/11/2015  CLINICAL DATA:  Knee pain for 2-3 days, no known injury, initial encounter EXAM: LEFT KNEE - COMPLETE 4+ VIEW COMPARISON:  None. FINDINGS: No acute fracture or dislocation is noted. Mild joint space narrowing is noted medially. No soft tissue abnormality is noted. IMPRESSION: Mild degenerative change without acute abnormality. Electronically Signed   By: Inez Catalina M.D.   On: 10/11/2015 12:55   Results for orders placed or performed in visit on 10/11/15 (from the past 24 hour(s))  POCT CBC     Status: Abnormal   Collection Time: 10/11/15 12:22 PM  Result Value Ref Range   WBC 4.9 4.6 - 10.2 K/uL   Lymph, poc 1.9 0.6 - 3.4   POC LYMPH PERCENT 38.9 10 - 50 %L   MID (cbc) 0.4 0 - 0.9   POC MID % 8.4 0 - 12 %M   POC Granulocyte 2.6 2 - 6.9   Granulocyte percent 52.7 37 - 80 %  G   RBC 5.93 4.69 - 6.13 M/uL   Hemoglobin 13.6 (A) 14.1 - 18.1 g/dL   HCT, POC 42.2 (A) 43.5 - 53.7 %   MCV 71.1 (A) 80 - 97 fL   MCH, POC 23.0 (A) 27 - 31.2 pg   MCHC 32.3 31.8 - 35.4 g/dL   RDW, POC 14.3 %   Platelet Count, POC 203 142 - 424 K/uL   MPV 8.8 0 - 99.8 fL  POCT urinalysis dipstick     Status: Abnormal   Collection Time: 10/11/15 12:37 PM  Result Value Ref Range   Color, UA yellow yellow   Clarity, UA clear clear   Glucose, UA negative negative    Bilirubin, UA negative negative   Ketones, POC UA negative negative   Spec Grav, UA 1.025    Blood, UA negative negative   pH, UA 5.5    Protein Ur, POC trace (A) negative   Urobilinogen, UA 0.2    Nitrite, UA Negative Negative   Leukocytes, UA Negative Negative   ECG interpretation - non-specific changes including flattening of t-waves in V5-V6, possible RVR in V1 and ST changes in V2-V3. Largely unchanged from ECG 12/09/2014.  Assessment and Plan :   1. Elevated blood pressure reading 2. Essential hypertension - Uncontrolled, patient to start amlodipine 5mg . He will likely need to increase to 10mg . Patient wants to follow up with Dr. Laney Pastor for this.  3. History of gout 4. Left knee pain - Due to his severely elevated BP, recommended patient take Tylenol 650mg  every 6-8 hours. Suspect arthritis versus gout. Recheck if no improvement in his pain.   5. Anemia, unspecified - Suspect iron deficiency anemia, possible GI bleed. Patient had colonoscopy 5 years ago, does not want to pursue further work-up. States that he will f/u with Dr. Laney Pastor.  Jaynee Eagles, PA-C [facility] [phone] 10/11/2015 11:56 AM

## 2015-10-11 NOTE — Patient Instructions (Signed)
- You may use Tylenol 650mg  every 6-8 hours for your knee pain.  Knee Pain Knee pain is a very common symptom and can have many causes. Knee pain often goes away when you follow your health care provider's instructions for relieving pain and discomfort at home. However, knee pain can develop into a condition that needs treatment. Some conditions may include:  Arthritis caused by wear and tear (osteoarthritis).  Arthritis caused by swelling and irritation (rheumatoid arthritis or gout).  A cyst or growth in your knee.  An infection in your knee joint.  An injury that will not heal.  Damage, swelling, or irritation of the tissues that support your knee (torn ligaments or tendinitis). If your knee pain continues, additional tests may be ordered to diagnose your condition. Tests may include X-rays or other imaging studies of your knee. You may also need to have fluid removed from your knee. Treatment for ongoing knee pain depends on the cause, but treatment may include:  Medicines to relieve pain or swelling.  Steroid injections in your knee.  Physical therapy.  Surgery. HOME CARE INSTRUCTIONS  Take medicines only as directed by your health care provider.  Rest your knee and keep it raised (elevated) while you are resting.  Do not do things that cause or worsen pain.  Avoid high-impact activities or exercises, such as running, jumping rope, or doing jumping jacks.  Apply ice to the knee area:  Put ice in a plastic bag.  Place a towel between your skin and the bag.  Leave the ice on for 20 minutes, 2-3 times a day.  Ask your health care provider if you should wear an elastic knee support.  Keep a pillow under your knee when you sleep.  Lose weight if you are overweight. Extra weight can put pressure on your knee.  Do not use any tobacco products, including cigarettes, chewing tobacco, or electronic cigarettes. If you need help quitting, ask your health care provider.  Smoking may slow the healing of any bone and joint problems that you may have. SEEK MEDICAL CARE IF:  Your knee pain continues, changes, or gets worse.  You have a fever along with knee pain.  Your knee buckles or locks up.  Your knee becomes more swollen. SEEK IMMEDIATE MEDICAL CARE IF:   Your knee joint feels hot to the touch.  You have chest pain or trouble breathing.   This information is not intended to replace advice given to you by your health care provider. Make sure you discuss any questions you have with your health care provider.   Document Released: 08/14/2007 Document Revised: 11/07/2014 Document Reviewed: 06/02/2014 Elsevier Interactive Patient Education 2016 [place] Your High Blood Pressure Blood pressure is a measurement of how forceful your blood is pressing against the walls of the arteries. Arteries are muscular tubes within the circulatory system. Blood pressure does not stay the same. Blood pressure rises when you are active, excited, or nervous; and it lowers during sleep and relaxation. If the numbers measuring your blood pressure stay above normal most of the time, you are at risk for health problems. High blood pressure (hypertension) is a long-term (chronic) condition in which blood pressure is elevated. A blood pressure reading is recorded as two numbers, such as 120 over 80 (or 120/80). The first, higher number is called the systolic pressure. It is a measure of the pressure in your arteries as the heart beats. The second, lower number is  called the diastolic pressure. It is a measure of the pressure in your arteries as the heart relaxes between beats.  Keeping your blood pressure in a normal range is important to your overall health and prevention of health problems, such as heart disease and stroke. When your blood pressure is uncontrolled, your heart has to work harder than normal. High blood pressure is a very common condition in adults  because blood pressure tends to rise with age. Men and women are equally likely to have hypertension but at different times in life. Before age 53, men are more likely to have hypertension. After 59 years of age, women are more likely to have it. Hypertension is especially common in African Americans. This condition often has no signs or symptoms. The cause of the condition is usually not known. Your caregiver can help you come up with a plan to keep your blood pressure in a normal, healthy range. BLOOD PRESSURE STAGES Blood pressure is classified into four stages: normal, prehypertension, stage 1, and stage 2. Your blood pressure reading will be used to determine what type of treatment, if any, is necessary. Appropriate treatment options are tied to these four stages:  Normal  Systolic pressure (mm Hg): below 120.  Diastolic pressure (mm Hg): below 80. Prehypertension  Systolic pressure (mm Hg): 120 to 139.  Diastolic pressure (mm Hg): 80 to 89. Stage1  Systolic pressure (mm Hg): 140 to 159.  Diastolic pressure (mm Hg): 90 to 99. Stage2  Systolic pressure (mm Hg): 160 or above.  Diastolic pressure (mm Hg): 100 or above. RISKS RELATED TO HIGH BLOOD PRESSURE Managing your blood pressure is an important responsibility. Uncontrolled high blood pressure can lead to:  A heart attack.  A stroke.  A weakened blood vessel (aneurysm).  Heart failure.  Kidney damage.  Eye damage.  Metabolic syndrome.  Memory and concentration problems. HOW TO MANAGE YOUR BLOOD PRESSURE Blood pressure can be managed effectively with lifestyle changes and medicines (if needed). Your caregiver will help you come up with a plan to bring your blood pressure within a normal range. Your plan should include the following: Education  Read all information provided by your caregivers about how to control blood pressure.  Educate yourself on the latest guidelines and treatment recommendations. New research  is always being done to further define the risks and treatments for high blood pressure. Lifestylechanges  Control your weight.  Avoid smoking.  Stay physically active.  Reduce the amount of salt in your diet.  Reduce stress.  Control any chronic conditions, such as high cholesterol or diabetes.  Reduce your alcohol intake. Medicines  Several medicines (antihypertensive medicines) are available, if needed, to bring blood pressure within a normal range. Communication  Review all the medicines you take with your caregiver because there may be side effects or interactions.  Talk with your caregiver about your diet, exercise habits, and other lifestyle factors that may be contributing to high blood pressure.  See your caregiver regularly. Your caregiver can help you create and adjust your plan for managing high blood pressure. RECOMMENDATIONS FOR TREATMENT AND FOLLOW-UP  The following recommendations are based on current guidelines for managing high blood pressure in nonpregnant adults. Use these recommendations to identify the proper follow-up period or treatment option based on your blood pressure reading. You can discuss these options with your caregiver.  Systolic pressure of 123456 to [id number] or diastolic pressure of 80 to 89: Follow up with your caregiver as directed.  Systolic pressure of  140 to 0000000 or diastolic pressure of 90 to 100: Follow up with your caregiver within 2 months.  Systolic pressure above 0000000 or diastolic pressure above [id number]: Follow up with your caregiver within 1 month.  Systolic pressure above [id number] or diastolic pressure above [id number]: Consider antihypertensive therapy; follow up with your caregiver within 1 week.  Systolic pressure above [id number] or diastolic pressure above 123456: Begin antihypertensive therapy; follow up with your caregiver within 1 week.   This information is not intended to replace advice given to you by your health care provider. Make sure you discuss  any questions you have with your health care provider.   Document Released: 07/11/2012 Document Reviewed: 07/11/2012 Elsevier Interactive Patient Education [employer].

## 2015-10-12 ENCOUNTER — Encounter: Payer: Self-pay | Admitting: Urgent Care

## 2015-10-12 LAB — FERRITIN: Ferritin: 253 ng/mL (ref 22–322)

## 2015-11-03 ENCOUNTER — Other Ambulatory Visit: Payer: Self-pay | Admitting: Family Medicine

## 2015-12-14 ENCOUNTER — Ambulatory Visit (INDEPENDENT_AMBULATORY_CARE_PROVIDER_SITE_OTHER): Payer: BC Managed Care – PPO | Admitting: Family Medicine

## 2015-12-14 VITALS — BP 140/80 | HR 94 | Temp 98.8°F | Resp 20 | Ht 66.0 in | Wt 197.8 lb

## 2015-12-14 DIAGNOSIS — J209 Acute bronchitis, unspecified: Secondary | ICD-10-CM | POA: Diagnosis not present

## 2015-12-14 DIAGNOSIS — E041 Nontoxic single thyroid nodule: Secondary | ICD-10-CM | POA: Diagnosis not present

## 2015-12-14 MED ORDER — AZITHROMYCIN 250 MG PO TABS: ORAL_TABLET | ORAL | Status: DC

## 2015-12-14 MED ORDER — ALBUTEROL SULFATE 108 (90 BASE) MCG/ACT IN AEPB: 2.0000 | INHALATION_SPRAY | Freq: Four times a day (QID) | RESPIRATORY_TRACT | Status: DC | PRN

## 2015-12-14 NOTE — Patient Instructions (Signed)
Thyroid Nodule A thyroid nodule is an isolatedgrowth of thyroid cells that forms a lump in your thyroid gland. The thyroid gland is a butterfly-shaped gland. It is found in the lower front of your neck. This gland sends chemical messengers (hormones) through your blood to all parts of your body. These hormones are important in regulating your body temperature and helping your body to use energy. Thyroid nodules are common. Most are not cancerous (are benign). You may have one nodule or several nodules.  Different types of thyroid nodules include:  Nodules that grow and fill with fluid (thyroid cysts).  Nodules that produce too much thyroid hormone (hot nodules or hyperthyroid).  Nodules that produce no thyroid hormone (cold nodules or hypothyroid).  Nodules that form from cancer cells (thyroid cancers). CAUSES Usually, the cause of this condition is not known. RISK FACTORS Factors that make this condition more likely to develop include:  Increasing age. Thyroid nodules become more common in people who are older than 60 years of age.  Gender.  Benign thyroid nodules are more common in women.  Cancerous (malignant) thyroid nodules are more common in men.  A family history that includes:  Thyroid nodules.  Pheochromocytoma.  Thyroid carcinoma.  Hyperparathyroidism.  Certain kinds of thyroid diseases, such as Hashimoto thyroiditis.  Lack of iodine.  A history of head and neck radiation, such as from X-rays. SYMPTOMS It is common for this condition to cause no symptoms. If you have symptoms, they may include:  A lump in your lower neck.  Feeling a lump or tickle in your throat.  Pain in your neck, jaw, or ear.  Having trouble swallowing. Hot nodules may cause symptoms that include:  Weight loss.  Warm, flushed skin.  Feeling hot.  Feeling nervous.  A racing heartbeat. Cold nodules may cause symptoms that include:  Weight gain.  Dry skin.  Brittle hair.  This may also occur with hair loss.  Feeling cold.  Fatigue. Thyroid cancer nodules may cause symptoms that include:  Hard nodules that feel stuck to the thyroid gland.  Hoarseness.  Lumps in the glands near your thyroid (lymph nodes). DIAGNOSIS A thyroid nodule may be felt by your health care provider during a physical exam. This condition may also be diagnosed based on your symptoms. You may also have tests, including:  An ultrasound. This may be done to confirm the diagnosis.  A biopsy. This involves taking a sample from the nodule and looking at it under a microscope to see if the nodule is benign.  Blood tests to make sure that your thyroid is working properly.  Imaging tests such as MRI or CT scan may be done if:  Your nodule is large.  Your nodule is blocking your airway.  Cancer is suspected. TREATMENT Treatment depends on the cause and size of your nodule or nodules. If the nodule is benign, treatment may not be necessary. Your health care provider may monitor the nodule to see if it goes away without treatment. If the nodule continues to grow, is cancerous, or does not go away:  It may need to be drained with a needle.  It may need to be removed with surgery. If you have surgery, part or all of your thyroid gland may need to be removed as well. HOME CARE INSTRUCTIONS  Pay attention to any changes in your nodule.  Take over-the-counter and prescription medicines only as told by your health care provider.  Keep all follow-up visits as told by your health  care provider. This is important. SEEK MEDICAL CARE IF:  Your voice changes.  You have trouble swallowing.  You have pain in your neck, ear, or jaw that is getting worse.  Your nodule gets bigger.  Your nodule starts to make it harder for you to breathe. SEEK IMMEDIATE MEDICAL CARE IF:  You have a sudden fever.  You feel very weak.  Your muscles look like they are shrinking (muscle wasting).  You  have mood swings.  You feel very restless.  You feel confused.  You are seeing or hearing things that other people do not see or hear (having hallucinations).  You feel suddenly nauseous or throw up.  You suddenly have diarrhea.  You have chest pain.  There is a loss of consciousness.   This information is not intended to replace advice given to you by your health care provider. Make sure you discuss any questions you have with your health care provider.   Document Released: 09/09/2004 Document Revised: 07/08/2015 Document Reviewed: 01/28/2015 Elsevier Interactive Patient Education 2016 [employer] Acute Bronchitis Bronchitis is inflammation of the airways that extend from the windpipe into the lungs (bronchi). The inflammation often causes mucus to develop. This leads to a cough, which is the most common symptom of bronchitis.  In acute bronchitis, the condition usually develops suddenly and goes away over time, usually in a couple weeks. Smoking, allergies, and asthma can make bronchitis worse. Repeated episodes of bronchitis may cause further lung problems.  CAUSES Acute bronchitis is most often caused by the same virus that causes a cold. The virus can spread from person to person (contagious) through coughing, sneezing, and touching contaminated objects. SIGNS AND SYMPTOMS   Cough.   Fever.   Coughing up mucus.   Body aches.   Chest congestion.   Chills.   Shortness of breath.   Sore throat.  DIAGNOSIS  Acute bronchitis is usually diagnosed through a physical exam. Your health care provider will also ask you questions about your medical history. Tests, such as chest X-rays, are sometimes done to rule out other conditions.  TREATMENT  Acute bronchitis usually goes away in a couple weeks. Oftentimes, no medical treatment is necessary. Medicines are sometimes given for relief of fever or cough. Antibiotic medicines are usually not needed but may be prescribed  in certain situations. In some cases, an inhaler may be recommended to help reduce shortness of breath and control the cough. A cool mist vaporizer may also be used to help thin bronchial secretions and make it easier to clear the chest.  HOME CARE INSTRUCTIONS  Get plenty of rest.   Drink enough fluids to keep your urine clear or pale yellow (unless you have a medical condition that requires fluid restriction). Increasing fluids may help thin your respiratory secretions (sputum) and reduce chest congestion, and it will prevent dehydration.   Take medicines only as directed by your health care provider.  If you were prescribed an antibiotic medicine, finish it all even if you start to feel better.  Avoid smoking and secondhand smoke. Exposure to cigarette smoke or irritating chemicals will make bronchitis worse. If you are a smoker, consider using nicotine gum or skin patches to help control withdrawal symptoms. Quitting smoking will help your lungs heal faster.   Reduce the chances of another bout of acute bronchitis by washing your hands frequently, avoiding people with cold symptoms, and trying not to touch your hands to your mouth, nose, or eyes.   Keep all follow-up  visits as directed by your health care provider.  SEEK MEDICAL CARE IF: Your symptoms do not improve after 1 week of treatment.  SEEK IMMEDIATE MEDICAL CARE IF:  You develop an increased fever or chills.   You have chest pain.   You have severe shortness of breath.  You have bloody sputum.   You develop dehydration.  You faint or repeatedly feel like you are going to pass out.  You develop repeated vomiting.  You develop a severe headache. MAKE SURE YOU:   Understand these instructions.  Will watch your condition.  Will get help right away if you are not doing well or get worse.   This information is not intended to replace advice given to you by your health care provider. Make sure you discuss any  questions you have with your health care provider.   Document Released: 11/24/2004 Document Revised: 11/07/2014 Document Reviewed: 04/09/2013 Elsevier Interactive Patient Education [employer].

## 2015-12-14 NOTE — Progress Notes (Signed)
   Subjective:    Patient ID: Ruben Masse., male    DOB: 04-30-1956, 60 y.o.   MRN: [id number] By signing my name below, I, Zola Button, attest that this documentation has been prepared under the direction and in the presence of Robyn Haber, MD.  Electronically Signed: Zola Button, Medical Scribe. 12/14/2015. 11:09 AM.  HPI HPI Comments: Ruben Annese. is a 60 y.o. male who presents to the [facility] complaining of gradual onset, productive cough that started a few days ago. Patient reports having associated SOB and chest congestion. He sometimes feels that something is stuck in his throat/chest, which improves after coughing up phlegm. Patient denies history of asthma. 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   Respiratory: Positive for cough and shortness of breath.        Objective:   Physical Exam CONSTITUTIONAL: Well developed/well nourished HEAD: Normocephalic/atraumatic EYES: EOM/PERRL ENMT: Mucous membranes moist NECK: supple no meningeal signs. 1 cm thyroid nodule in upper right pole. SPINE: entire spine nontender CV: S1/S2 noted, no murmurs/rubs/gallops noted LUNGS: Expiratory wheezes ABDOMEN: soft, nontender, no rebound or guarding GU: no cva tenderness NEURO: Pt is awake/alert, moves all extremitiesx4 EXTREMITIES: pulses normal, full ROM SKIN: warm, color normal PSYCH: no abnormalities of mood noted        Assessment & Plan:   This chart was scribed in my presence and reviewed by me personally.    ICD-9-CM ICD-10-CM   1. Acute bronchitis, unspecified organism 466.0 J20.9 azithromycin (ZITHROMAX) 250 MG tablet     Albuterol Sulfate (PROAIR RESPICLICK) 123XX123 (90 Base) MCG/ACT AEPB  2. Right thyroid nodule 241.0 E04.1 US Soft Tissue Head/Neck      Signed, Robyn Haber, MD

## 2015-12-16 ENCOUNTER — Ambulatory Visit (INDEPENDENT_AMBULATORY_CARE_PROVIDER_SITE_OTHER): Payer: BC Managed Care – PPO

## 2015-12-16 ENCOUNTER — Ambulatory Visit (INDEPENDENT_AMBULATORY_CARE_PROVIDER_SITE_OTHER): Payer: BC Managed Care – PPO | Admitting: Family Medicine

## 2015-12-16 VITALS — BP 148/88 | HR 83 | Temp 99.4°F | Resp 17 | Ht 69.0 in | Wt 198.0 lb

## 2015-12-16 DIAGNOSIS — R509 Fever, unspecified: Secondary | ICD-10-CM | POA: Diagnosis not present

## 2015-12-16 DIAGNOSIS — M10071 Idiopathic gout, right ankle and foot: Secondary | ICD-10-CM

## 2015-12-16 DIAGNOSIS — R05 Cough: Secondary | ICD-10-CM

## 2015-12-16 DIAGNOSIS — R5381 Other malaise: Secondary | ICD-10-CM

## 2015-12-16 DIAGNOSIS — R059 Cough, unspecified: Secondary | ICD-10-CM

## 2015-12-16 LAB — POCT CBC
Granulocyte percent: 68.3 %G (ref 37–80)
HEMATOCRIT: 42.5 % — AB (ref 43.5–53.7)
Hemoglobin: 14.3 g/dL (ref 14.1–18.1)
Lymph, poc: 1.8 (ref 0.6–3.4)
MCH: 23.2 pg — AB (ref 27–31.2)
MCHC: 33.7 g/dL (ref 31.8–35.4)
MCV: 68.9 fL — AB (ref 80–97)
MID (cbc): 0.2 (ref 0–0.9)
MPV: 8.3 fL (ref 0–99.8)
PLATELET COUNT, POC: 188 10*3/uL (ref 142–424)
POC GRANULOCYTE: 4.2 (ref 2–6.9)
POC LYMPH %: 28.7 % (ref 10–50)
POC MID %: 3 % (ref 0–12)
RBC: 6.16 M/uL — AB (ref 4.69–6.13)
RDW, POC: 14.9 %
WBC: 6.2 10*3/uL (ref 4.6–10.2)

## 2015-12-16 LAB — POCT INFLUENZA A/B
Influenza A, POC: NEGATIVE
Influenza B, POC: NEGATIVE

## 2015-12-16 MED ORDER — COLCHICINE 0.6 MG PO TABS: ORAL_TABLET | ORAL | Status: DC

## 2015-12-16 MED ORDER — HYDROCODONE-HOMATROPINE 5-1.5 MG/5ML PO SYRP: 5.0000 mL | ORAL_SOLUTION | Freq: Three times a day (TID) | ORAL | Status: DC | PRN

## 2015-12-16 NOTE — Patient Instructions (Addendum)
Because you received an x-ray today, you will receive an invoice from [facility]. Please contact [facility] at [phone] with questions or concerns regarding your invoice. Our billing staff will not be able to assist you with those questions.   You likely do have the flu Try the cough syrup as needed but remember it will make you feel sleepy Take the colchicine as needed for your gout pain- take 2 pills today and then you may take 1 pill a day as needed for the next 2-3 days  Rest and drink plenty of fluids Let me know if you do not feel better soon

## 2015-12-16 NOTE — Progress Notes (Signed)
[facility] [address], [place] [address] [phone]  Date:  12/16/2015   Name:  Ruben Camacho.   DOB:  12-18-1955   MRN:  [id number]  PCP:  Leandrew Koyanagi, MD    Chief Complaint: Shortness of Breath; Cough; and URI   History of Present Illness:  Ruben Mickel. is a 60 y.o. very pleasant male patient who presents with the following:  History of HTN, high cholesterol and CVA in 2013. He was here 2 days ago with complaint of cough, SOB and chest congestion. He was started on azithromycin for bronchitis.   He is taking the abx and using an albuterol inhaler.  However he is not feeling much better- he has a severe cough, he cannot sleep well at night.   He sometimes will cough up some phlegm but othertimes he cannot get anything up.   His gout has flared up in his right foot over the last couple of days as well He has noted some low grade temps.   He feels very tired   Patient Active Problem List   Diagnosis Date Noted  . Intractable migraine without aura and without status migrainosus 01/08/2015  . Hypertension 11/30/2011  . Hyperlipemia 11/30/2011  . Gout 11/30/2011  . Beta thalassemia ([place]) 11/30/2011    Past Medical History  Diagnosis Date  . Hypertension   . Hyperlipidemia   . Stroke ([place]) 07/01/2012    L sided weakness, slurred speech.  Admission [facility].    . Gout     1-2 exacerbations per year.  Takes Advil PRN for pain.  . Migraine     Past Surgical History  Procedure Laterality Date  . Hernia repair    . Admission  07/01/2012    CVA (L sided weakness, slurred speech).  [facility].  . Colonoscopy  10/31/2008    no polyps.  Repeat in 10 years.  [place].    Social History  Substance Use Topics  . Smoking status: Never Smoker   . Smokeless tobacco: Never Used  . Alcohol Use: No     Comment: former drinker    Family History  Problem Relation Age of Onset  . Stroke Mother   . Hypertension Mother   . Hypertension  Daughter   . Cancer Maternal Grandmother     lung  . Healthy Father     Died in motorcycle accident    No Known Allergies  Medication list has been reviewed and updated.  Current Outpatient Prescriptions on File Prior to Visit  Medication Sig Dispense Refill  . Albuterol Sulfate (PROAIR RESPICLICK) 123XX123 (90 Base) MCG/ACT AEPB Inhale 2 puffs into the lungs every 6 (six) hours as needed. 1 each 3  . amLODipine (NORVASC) 5 MG tablet Take 1 tablet (5 mg total) by mouth daily. 90 tablet 3  . aspirin 81 MG tablet Take 81 mg by mouth daily.    Marland Kitchen azithromycin (ZITHROMAX) 250 MG tablet Take 2 tabs PO x 1 dose, then 1 tab PO QD x 4 days 6 tablet 0  . hydrochlorothiazide (HYDRODIURIL) 25 MG tablet Take 1 tablet (25 mg total) by mouth daily. 90 tablet 0  . hydrochlorothiazide (HYDRODIURIL) 25 MG tablet TAKE 1 TABLET BY MOUTH EVERY DAY 90 tablet 1   No current facility-administered medications on file prior to visit.    Review of Systems:  As per HPI- otherwise negative.   Physical Examination: Filed Vitals:   12/16/15 0812  BP: 148/88  Pulse: 83  Temp: 99.4 F (37.4 C)  Resp: 17   Filed Vitals:   12/16/15 0812  Height: 5\' 9"  (1.753 m)  Weight: 198 lb (89.812 kg)   Body mass index is 29.23 kg/(m^2). Ideal Body Weight: Weight in (lb) to have BMI = 25: 168.9  GEN: WDWN, NAD, Non-toxic, A & O x 3, overweight, does not appear to feel well, coughing in room HEENT: Atraumatic, Normocephalic. Neck supple. No masses, No LAD.  Bilateral TM wnl, oropharynx normal.  PEERL,EOMI.   Ears and Nose: No external deformity. CV: RRR, No M/G/R. No JVD. No thrill. No extra heart sounds. PULM: CTA B, no wheezes, crackles, rhonchi. No retractions. No resp. distress. No accessory muscle use. ABD: S, NT, ND EXTR: No c/c/e NEURO favoring right slightly PSYCH: Normally interactive. Conversant. Not depressed or anxious appearing.  Calm demeanor.  Right foot: he has tenderness over the dorsum of the foot,  no swelling or redness, no heat. Foot is NV intact  Results for orders placed or performed in visit on 12/16/15  POCT Influenza A/B  Result Value Ref Range   Influenza A, POC Negative Negative   Influenza B, POC Negative Negative  POCT CBC  Result Value Ref Range   WBC 6.2 4.6 - 10.2 K/uL   Lymph, poc 1.8 0.6 - 3.4   POC LYMPH PERCENT 28.7 10 - 50 %L   MID (cbc) 0.2 0 - 0.9   POC MID % 3.0 0 - 12 %M   POC Granulocyte 4.2 2 - 6.9   Granulocyte percent 68.3 37 - 80 %G   RBC 6.16 (A) 4.69 - 6.13 M/uL   Hemoglobin 14.3 14.1 - 18.1 g/dL   HCT, POC 42.5 (A) 43.5 - 53.7 %   MCV 68.9 (A) 80 - 97 fL   MCH, POC 23.2 (A) 27 - 31.2 pg   MCHC 33.7 31.8 - 35.4 g/dL   RDW, POC 14.9 %   Platelet Count, POC 188 142 - 424 K/uL   MPV 8.3 0 - 99.8 fL   Dg Chest 2 View  12/16/2015  CLINICAL DATA:  Cough, shortness of breath, chest congestion for 2 days EXAM: CHEST  2 VIEW COMPARISON:  03/13/2014 FINDINGS: Loss of the left hemidiaphragm on the frontal view is stable since prior study and likely reflects scarring at the left base. Right lung is clear. Heart is normal size. No effusions. No acute bony abnormality. IMPRESSION: Probable left basilar scarring, unchanged since 2015. No active disease. Electronically Signed   By: Rolm Baptise M.D.   On: 12/16/2015 09:11     Assessment and Plan: Malaise - Plan: POCT Influenza A/B  Cough - Plan: POCT Influenza A/B, POCT CBC, DG Chest 2 View, HYDROcodone-homatropine (HYCODAN) 5-1.5 MG/5ML syrup  Fever, low grade - Plan: POCT Influenza A/B, POCT CBC  Acute idiopathic gout of right foot - Plan: colchicine 0.6 MG tablet  Here today with continued illness.  Suspect that he may have the flu as there is a large outbreak in our community currently.  However it is too late for tamiflu He will continue his azithromycin rx for colchicine for his gout Hycodan for cough and will also help with gout pain Note to be OOW the rest of the week  Signed Lamar Blinks,  [place] [phone]

## 2016-03-09 ENCOUNTER — Ambulatory Visit (INDEPENDENT_AMBULATORY_CARE_PROVIDER_SITE_OTHER): Payer: BC Managed Care – PPO | Admitting: Internal Medicine

## 2016-03-09 VITALS — BP 120/80 | HR 102 | Temp 98.3°F | Resp 18 | Ht 69.0 in | Wt 195.0 lb

## 2016-03-09 DIAGNOSIS — M10071 Idiopathic gout, right ankle and foot: Secondary | ICD-10-CM | POA: Diagnosis not present

## 2016-03-09 DIAGNOSIS — M109 Gout, unspecified: Secondary | ICD-10-CM

## 2016-03-09 MED ORDER — COLCHICINE 0.6 MG PO TABS: ORAL_TABLET | ORAL | Status: DC

## 2016-03-09 NOTE — Patient Instructions (Signed)
     IF you received an x-ray today, you will receive an invoice from [facility]. Please contact [facility] at [phone] with questions or concerns regarding your invoice.   IF you received labwork today, you will receive an invoice from [facility]. Please contact [facility] at [phone] with questions or concerns regarding your invoice.   Our billing staff will not be able to assist you with questions regarding bills from these companies.  You will be contacted with the lab results as soon as they are available. The fastest way to get your results is to activate your My Chart account. Instructions are located on the last page of this paperwork. If you have not heard from us regarding the results in 2 weeks, please contact this office.      

## 2016-03-09 NOTE — Progress Notes (Signed)
Subjective:  By signing my name below, I, Ruben Camacho, attest that this documentation has been prepared under the direction and in the presence of Leandrew Koyanagi, MD Electronically Signed: Ladene Artist, ED Scribe 03/09/2016 at 9:20 AM.   Patient ID: Ruben Masse., male    DOB: 1956/06/22, 60 y.o.   MRN: [id number] Chief Complaint  Patient presents with  . Gout    Right foot and ankle   HPI HPI Comments: Ruben Addis. is a 60 y.o. male, with a h/o gout, who presents to the Emergency Department complaining of flare-up of gout in the fifth toe onset 3 days ago. He states that pain moved into his right ankle this morning. Pain is worsened with movement and palpation. Pt states that he stopped eating foods to trigger gout but is still experiencing flare-ups. 2 months ago he was changed from Lisinopril to HCTZ out of concern that is was contributing to gout. His last episode was in February and the episode before that was in December 2016. He tried colchicine in February with relief. Pt denies h/o arthritis other than his feet.  Left Heel Pain Pt reports gradually worsening left heel pain for the past month that is worsened with palpation. No treatments tried PTA.   Past Medical History  Diagnosis Date  . Hypertension   . Hyperlipidemia   . Stroke ([place]) 07/01/2012    L sided weakness, slurred speech.  Admission [facility].    . Gout     1-2 exacerbations per year.  Takes Advil PRN for pain.  . Migraine    Current Outpatient Prescriptions on File Prior to Visit  Medication Sig Dispense Refill  . Albuterol Sulfate (PROAIR RESPICLICK) 123XX123 (90 Base) MCG/ACT AEPB Inhale 2 puffs into the lungs every 6 (six) hours as needed. 1 each 3  . amLODipine (NORVASC) 5 MG tablet Take 1 tablet (5 mg total) by mouth daily. 90 tablet 3  . hydrochlorothiazide (HYDRODIURIL) 25 MG tablet Take 1 tablet (25 mg total) by mouth daily. 90 tablet 0  . aspirin 81 MG tablet Take 81 mg by mouth  daily.    Marland Kitchen azithromycin (ZITHROMAX) 250 MG tablet Take 2 tabs PO x 1 dose, then 1 tab PO QD x 4 days (Patient not taking: Reported on 03/09/2016) 6 tablet 0  . colchicine 0.6 MG tablet Take 2 pills once for gout flare.  May repeat 1 pill daily for 2-3 days if needed (Patient not taking: Reported on 03/09/2016) 30 tablet 0  . hydrochlorothiazide (HYDRODIURIL) 25 MG tablet TAKE 1 TABLET BY MOUTH EVERY DAY (Patient not taking: Reported on 03/09/2016) 90 tablet 1  . HYDROcodone-homatropine (HYCODAN) 5-1.5 MG/5ML syrup Take 5 mLs by mouth every 8 (eight) hours as needed for cough. (Patient not taking: Reported on 03/09/2016) 90 mL 0   No current facility-administered medications on file prior to visit.   No Known Allergies  Review of Systems  Musculoskeletal: Positive for joint swelling and arthralgias.   BP 120/80 mmHg  Pulse 102  Temp(Src) 98.3 F (36.8 C) (Oral)  Resp 18  Ht 5\' 9"  (1.753 m)  Wt 195 lb (88.451 kg)  BMI 28.78 kg/m2  SpO2 96%     Objective:   Physical Exam  Constitutional: He is oriented to person, place, and time. He appears well-developed and well-nourished. No distress.  HENT:  Head: Normocephalic and atraumatic.  Eyes: Conjunctivae are normal. Pupils are equal, round, and reactive to light.  Neck: Neck supple.  Cardiovascular:  Normal rate.   Pulmonary/Chest: Effort normal.  Musculoskeletal: Normal range of motion.  R 5th toes is swollen, red and very tender. Tenderness along medial ankle joint with warmth but no redness or swelling. Mild discomfort with ROM.   L heel with sl tend at post aspect consistent with bruise from shoe  Neurological: He is alert and oriented to person, place, and time.  Skin: Skin is warm and dry.  Psychiatric: He has a normal mood and affect. His behavior is normal.  Nursing note and vitals reviewed.     Assessment & Plan:   I have completed the patient encounter in its entirety as documented by the scribe, with editing by me where  necessary. Robert P. Laney Pastor, M.D.  Recurrent episode of gout Meds ordered this encounter  Medications  . colchicine 0.6 MG tablet    Sig: 1 tab bid til gout resolves then qd for 7 more days    Dispense:  20 tablet    Refill:  2    Add pineapple juice bid//folate 5-10mg  a day, vit E 400IU daily Avoid high fruc corn syrup  If fails this will start prophylaxis with urate reduction

## 2016-03-10 ENCOUNTER — Telehealth: Payer: Self-pay

## 2016-03-10 NOTE — Telephone Encounter (Signed)
Left message for pt to call back  °

## 2016-03-10 NOTE — Telephone Encounter (Signed)
Pt states pain is getting worse and needs something else to go with his medication he was prescribed.

## 2016-03-10 NOTE — Telephone Encounter (Signed)
Dr. Laney Pastor   Patient is requesting pain medication for gout.  [facility] on [address]  [phone]

## 2016-03-11 MED ORDER — PREDNISONE 20 MG PO TABS: ORAL_TABLET | ORAL | Status: DC

## 2016-03-11 MED ORDER — DICLOFENAC SODIUM 75 MG PO TBEC: 75.0000 mg | DELAYED_RELEASE_TABLET | Freq: Two times a day (BID) | ORAL | Status: DC

## 2016-03-11 NOTE — Telephone Encounter (Signed)
Pt advised.

## 2016-03-11 NOTE — Telephone Encounter (Signed)
Meds ordered this encounter  Medications  . predniSONE (DELTASONE) 20 MG tablet    Sig: 3/3/2/2/1/1 single daily dose for 6 days    Dispense:  12 tablet    Refill:  0  . diclofenac (VOLTAREN) 75 MG EC tablet    Sig: Take 1 tablet (75 mg total) by mouth 2 (two) times daily.    Dispense:  30 tablet    Refill:  0   If not responding by Monday then should be reexamined

## 2016-03-15 ENCOUNTER — Ambulatory Visit (INDEPENDENT_AMBULATORY_CARE_PROVIDER_SITE_OTHER): Payer: BC Managed Care – PPO | Admitting: Family Medicine

## 2016-03-15 VITALS — BP 138/88 | HR 73 | Temp 98.4°F | Resp 17 | Ht 69.5 in | Wt 198.0 lb

## 2016-03-15 DIAGNOSIS — M1990 Unspecified osteoarthritis, unspecified site: Secondary | ICD-10-CM

## 2016-03-15 DIAGNOSIS — M25572 Pain in left ankle and joints of left foot: Secondary | ICD-10-CM

## 2016-03-15 DIAGNOSIS — M25571 Pain in right ankle and joints of right foot: Secondary | ICD-10-CM | POA: Diagnosis not present

## 2016-03-15 MED ORDER — ALLOPURINOL 300 MG PO TABS: 300.0000 mg | ORAL_TABLET | Freq: Every day | ORAL | Status: DC

## 2016-03-15 MED ORDER — METHYLPREDNISOLONE 4 MG PO TABS: ORAL_TABLET | ORAL | Status: DC

## 2016-03-15 NOTE — Patient Instructions (Addendum)
IF you received an x-ray today, you will receive an invoice from [facility]. Please contact [facility] at [phone] with questions or concerns regarding your invoice.   IF you received labwork today, you will receive an invoice from [employer]. Please contact [facility] at [phone] with questions or concerns regarding your invoice.   Our billing staff will not be able to assist you with questions regarding bills from these companies.  You will be contacted with the lab results as soon as they are available. The fastest way to get your results is to activate your My Chart account. Instructions are located on the last page of this paperwork. If you have not heard from [place] regarding the results in 2 weeks, please contact this office.     Calcium Pyrophosphate Deposition  Calcium pyrophosphate deposition (CPPD), which is also called pseudogout, is a type of arthritis that causes pain, swelling, and inflammation in a joint. The joint pain can be severe and may last for days. If it is not treated, the pain may last much longer. Attacks of CPPD may come and go. This condition usually affects one joint at a time. The joints that are affected most commonly are the knees, but this condition can also affect the wrists, elbows, shoulders, or ankles. CPPD is similar to gout. Both conditions result from the buildup of crystals in the joint. However, CPPD is caused by a type of crystal that is different than the crystals that cause gout. CAUSES This condition is caused by the buildup of calcium pyrophosphate dihydrate crystals in the joint. The reason why this buildup occurs is not known. The condition may be passed down from parent to child (hereditary). RISK FACTORS This condition is more likely to develop in people who:  Are over 60 years old.  Have a family history of the condition.  Have had joint replacement surgery.  Have had a recent  injury.  Have certain medical conditions, such as hemophilia, ochronosis, amyloidosis, or hormonal disorders.  Have low blood magnesium levels. SYMPTOMS Symptoms of this condition include:  Pain in a joint. The pain may:  Be intense and constant.  Come on quickly.  Get worse with movement.  Last from several days to a few weeks.  Redness, swelling, and warmth at the joint.  Stiffness of the joint. DIAGNOSIS To diagnose this condition, your health care provider will use a needle to remove fluid from the joint. The fluid will be examined under a microscope to check for the crystals that cause CPPD. You may also have imaging tests, such as:  X-rays.  Ultrasound. TREATMENT There is no way to remove the crystals from the joint and no way to cure this condition. However, treatment can relieve symptoms and improve joint function. Treatment may include:  Nonsteroidal anti-inflammatory drugs (NSAIDs) to reduce inflammation and pain.  Medicines to help prevent attacks.  Injections of medicine (cortisone) into the joint to reduce pain and swelling.  Physical therapy to improve joint function. HOME CARE INSTRUCTIONS  Take medicines only as directed by your health care provider.  Rest the affected joints until your symptoms start to go away.  Keep your affected joints raised (elevated) when possible. This will help to reduce swelling.  If directed, apply ice to the affected area:  Put ice in a plastic bag.  Place a towel between your skin and the bag.  Leave the ice on for 20 minutes, 2-3 times per day.  If the painful  joint is in your leg, use crutches as directed by your health care provider.  When your symptoms start to go away, begin to exercise regularly or do physical therapy. Talk with your health care provider or physical therapist about what types of exercise are safe for you. Low-impact exercise may be best. This includes walking, swimming, bicycling, and water  aerobics.  Maintain a healthy weight so your joints do not need to bear more weight than necessary. SEEK MEDICAL CARE IF:  You have an increase in joint pain that is not relieved with medicine.  Your joint becomes more red, swollen, or stiff.  You have a fever.  You have a skin rash.   This information is not intended to replace advice given to you by your health care provider. Make sure you discuss any questions you have with your health care provider.   Document Released: 07/09/2004 Document Revised: 03/03/2015 Document Reviewed: 09/24/2014 Elsevier Interactive Patient Education 2016 [place] is a term that is commonly used to refer to joint pain or joint disease. There are more than 100 types of arthritis. CAUSES The most common cause of this condition is wear and tear of a joint. Other causes include:  Gout.  Inflammation of a joint.  An infection of a joint.  Sprains and other injuries near the joint.  A drug reaction or allergic reaction. In some cases, the cause may not be known. SYMPTOMS The main symptom of this condition is pain in the joint with movement. Other symptoms include:  Redness, swelling, or stiffness at a joint.  Warmth coming from the joint.  Fever.  Overall feeling of illness. DIAGNOSIS This condition may be diagnosed with a physical exam and tests, including:  Blood tests.  Urine tests.  Imaging tests, such as MRI, X-rays, or a CT scan. Sometimes, fluid is removed from a joint for testing. TREATMENT Treatment for this condition may involve:  Treatment of the cause, if it is known.  Rest.  Raising (elevating) the joint.  Applying cold or hot packs to the joint.  Medicines to improve symptoms and reduce inflammation.  Injections of a steroid such as cortisone into the joint to help reduce pain and inflammation. Depending on the cause of your arthritis, you may need to make lifestyle changes to reduce  stress on your joint. These changes may include exercising more and losing weight. HOME CARE INSTRUCTIONS Medicines  Take over-the-counter and prescription medicines only as told by your health care provider.  Do not take aspirin to relieve pain if gout is suspected. Activities  Rest your joint if told by your health care provider. Rest is important when your disease is active and your joint feels painful, swollen, or stiff.  Avoid activities that make the pain worse. It is important to balance activity with rest.  Exercise your joint regularly with range-of-motion exercises as told by your health care provider. Try doing low-impact exercise, such as:  Swimming.  Water aerobics.  Biking.  Walking. Joint Care  If your joint is swollen, keep it elevated if told by your health care provider.  If your joint feels stiff in the morning, try taking a warm shower.  If directed, apply heat to the joint. If you have diabetes, do not apply heat without permission from your health care provider.  Put a towel between the joint and the hot pack or heating pad.  Leave the heat on the area for 20-30 minutes.  If directed, apply ice to the  joint:  Put ice in a plastic bag.  Place a towel between your skin and the bag.  Leave the ice on for 20 minutes, 2-3 times per day.  Keep all follow-up visits as told by your health care provider. This is important. SEEK MEDICAL CARE IF:  The pain gets worse.  You have a fever. SEEK IMMEDIATE MEDICAL CARE IF:  You develop severe joint pain, swelling, or redness.  Many joints become painful and swollen.  You develop severe back pain.  You develop severe weakness in your leg.  You cannot control your bladder or bowels.   This information is not intended to replace advice given to you by your health care provider. Make sure you discuss any questions you have with your health care provider.   Document Released: 11/24/2004 Document Revised:  07/08/2015 Document Reviewed: 01/12/2015 Elsevier Interactive Patient Education [employer].

## 2016-03-15 NOTE — Progress Notes (Addendum)
Subjective:  By signing my name below, I, Ruben Camacho, attest that this documentation has been prepared under the direction and in the presence of Delman Cheadle, MD. Electronically Signed: Moises Camacho, [place]. 03/15/2016 , 9:09 AM .  Patient was seen in Room 13 .   Patient ID: Ruben Camacho., male    DOB: May 22, 1956, 60 y.o.   MRN: [id number] Chief Complaint  Patient presents with  . patient states he has gout in ankles and knees   HPI Ruben Camacho. is a 60 y.o. male who presents to [facility] complaining of worsening gout flare up that started over his right 5th toe 8 days ago. Patient was here last week initially for gout in his right foot. He was seen by Dr. Laney Pastor and was prescribed colchicine 0.6mg  qd. He called in the following day, informing the colchicine wasn't giving him any relief. So, prednisone was called in 4 days ago, 60mg  6 day taper, and transitioned to voltaren 75mg  bid.   He has taken the steroids and NSAID's prescribed without any relief. He's been laying down without icing over the past weekend. His flare up has now affected his left foot and bilateral knees worsening yesterday. He notes, "having a lot of pain, like his ankles are going to break when he's standing and applying pressure to them." He mentions having gout in both ankles simultaneously in the past. He denies any other known arthritis. He denies seeing a specialist.   He had a negative ANA and RF Feb 2016. He had uric acid elevated 9.1 at that time but on recheck in Oct 2016, during a gout flare, last uric acid was 7.8. No prior inflammation labs or rheumatology tests.   Past Medical History  Diagnosis Date  . Hypertension   . Hyperlipidemia   . Stroke ([place]) 07/01/2012    L sided weakness, slurred speech.  Admission [facility].    . Gout     1-2 exacerbations per year.  Takes Advil PRN for pain.  . Migraine    Prior to Admission medications   Medication Sig Start Date End Date Taking?  Authorizing Provider  amLODipine (NORVASC) 5 MG tablet Take 1 tablet (5 mg total) by mouth daily. 10/11/15  Yes Jaynee Eagles, PA-C  aspirin 81 MG tablet Take 81 mg by mouth daily.   Yes Historical Provider, MD  colchicine 0.6 MG tablet 1 tab bid til gout resolves then qd for 7 more days 03/09/16  Yes Leandrew Koyanagi, MD  hydrochlorothiazide (HYDRODIURIL) 25 MG tablet Take 1 tablet (25 mg total) by mouth daily. 03/16/15  Yes Leandrew Koyanagi, MD  diclofenac (VOLTAREN) 75 MG EC tablet Take 1 tablet (75 mg total) by mouth 2 (two) times daily. Patient not taking: Reported on 03/15/2016 03/11/16   Leandrew Koyanagi, MD   No Known Allergies  Review of Systems  Constitutional: Negative for fever, chills and fatigue.  Gastrointestinal: Negative for nausea and vomiting.  Musculoskeletal: Positive for myalgias, joint swelling, arthralgias and gait problem.  Skin: Negative for rash and wound.  Neurological: Negative for dizziness, weakness and numbness.      Objective:   Physical Exam  Constitutional: He is oriented to person, place, and time. He appears well-developed and well-nourished. No distress.  HENT:  Head: Normocephalic and atraumatic.  Eyes: EOM are normal. Pupils are equal, round, and reactive to light.  Neck: Neck supple.  Cardiovascular: Normal rate.   Pulses:      Dorsalis pedis pulses are  2+ on the right side, and 2+ on the left side.       Posterior tibial pulses are 2+ on the right side, and 2+ on the left side.  Pulmonary/Chest: Effort normal. No respiratory distress.  Musculoskeletal: Normal range of motion.  No lower extremity edema, or warmth  Neurological: He is alert and oriented to person, place, and time.  Skin: Skin is warm and dry.  Psychiatric: He has a normal mood and affect. His behavior is normal.  Nursing note and vitals reviewed.  BP 138/88 mmHg  Pulse 73  Temp(Src) 98.4 F (36.9 C) (Oral)  Resp 17  Ht 5' 9.5" (1.765 m)  Wt 198 lb (89.812 kg)  BMI  28.83 kg/m2  SpO2 96%    Assessment & Plan:   1. Arthritis   2. Inflammatory arthritis (Ruben Camacho) - pt has a h/o gout but he know presents complaining of a "gout" flair in bilateral knees and ankles simultaneously that has not responded at all to colcrys. In addition, he had an elevated uric acid of 9.1 once a year ago but other times it has stayed 7.5-7.8.  I spend quite a while today discussing with pt that this very well might not be gout - could be a different type of inflammatory arthritis such as pseudogout or could even just be osteoarthritis flair. Pt not convinced but willing to do labs and referral to evaluated this poss further.  Pt really wants to do trial of allopurinol which seems reasonable consider frequency of flairs and it's low risk profile so he will start as soon as current flair completely resolves.  Also, will have pt stop his hctz as could be triggering gout flairs and monitor BP at home. If elev, call so we can increase amlodipine from 5 to 10.  3. Bilateral ankle joint pain     Orders Placed This Encounter  Procedures  . Rheumatoid Arthritis Diagnostic Panel  . Ambulatory referral to Orthopedic Surgery    Referral Priority:  Routine    Referral Type:  Surgical    Referral Reason:  Specialty Services Required    Requested Specialty:  Orthopedic Surgery    Number of Visits Requested:  1    Meds ordered this encounter  Medications  . methylPREDNISolone (MEDROL) 4 MG tablet    Sig: 6-5-4-4-3-3-2-2-1-1 tabs po qd    Dispense:  31 tablet    Refill:  0  . allopurinol (ZYLOPRIM) 300 MG tablet    Sig: Take 1 tablet (300 mg total) by mouth daily. Do not start during gout flair    Dispense:  30 tablet    Refill:  1    I personally performed the services described in this documentation, which was scribed in my presence. The recorded information has been reviewed and considered, and addended by me as needed.   Delman Cheadle, M.D.  [facility] [address] [place], [place] [address] [phone] phone [phone] fax  04/01/2016 10:34 AM

## 2016-03-18 LAB — RHEUMATOID ARTHRITIS DIAGNOSTIC PANEL
Cyclic Citrullin Peptide Ab: <16 Units
Rhuematoid fact SerPl-aCnc: <10 IU/mL (ref <=14)

## 2016-03-23 ENCOUNTER — Other Ambulatory Visit: Payer: Self-pay | Admitting: Internal Medicine

## 2016-04-12 IMAGING — CR DG CERVICAL SPINE 2 OR 3 VIEWS
5 series · 5 of 5 positions shown · non-contrast
Comparison: Plain films cervical spine [DATE].

CLINICAL DATA: Cervical numbness and tingling.

EXAM:
CERVICAL SPINE - 2-3 VIEW

[lateral]
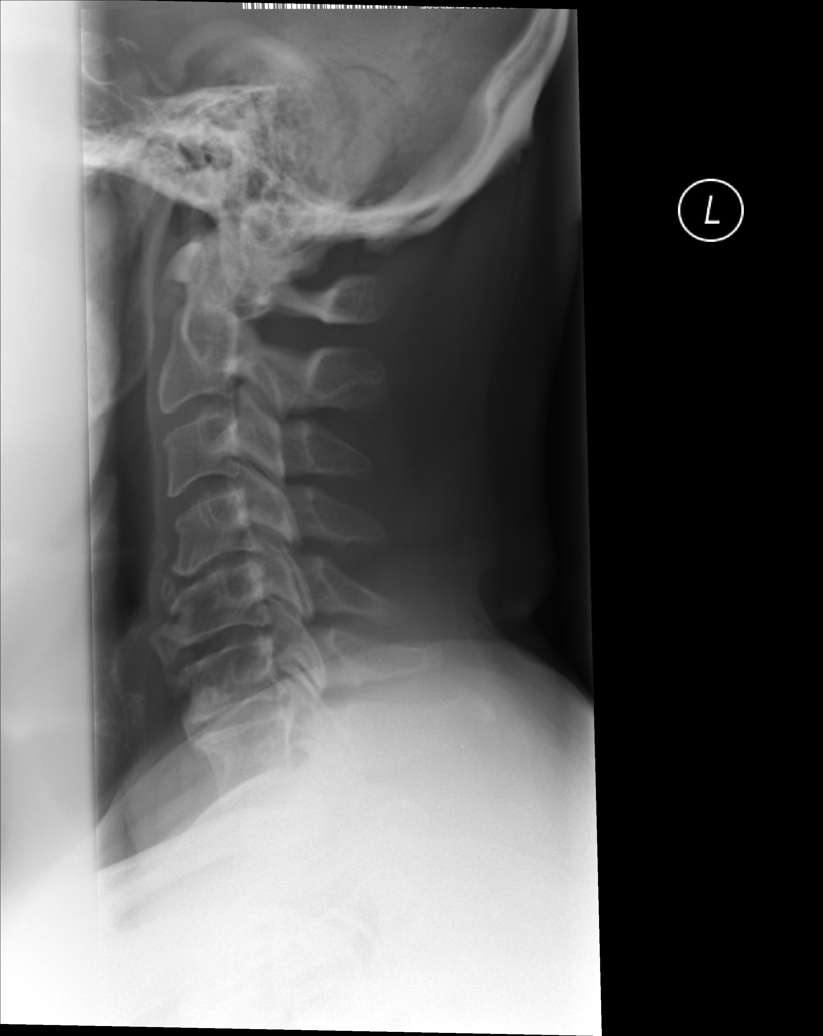

[ap open mouth (1 of 2)]
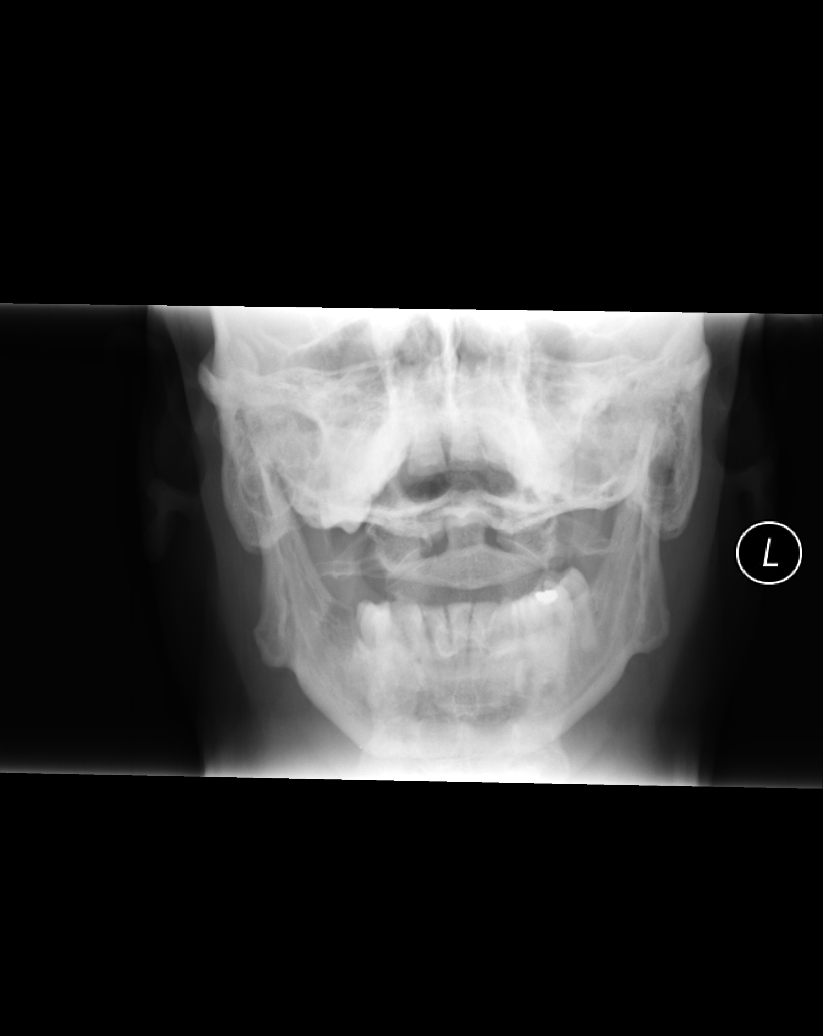

[AP]
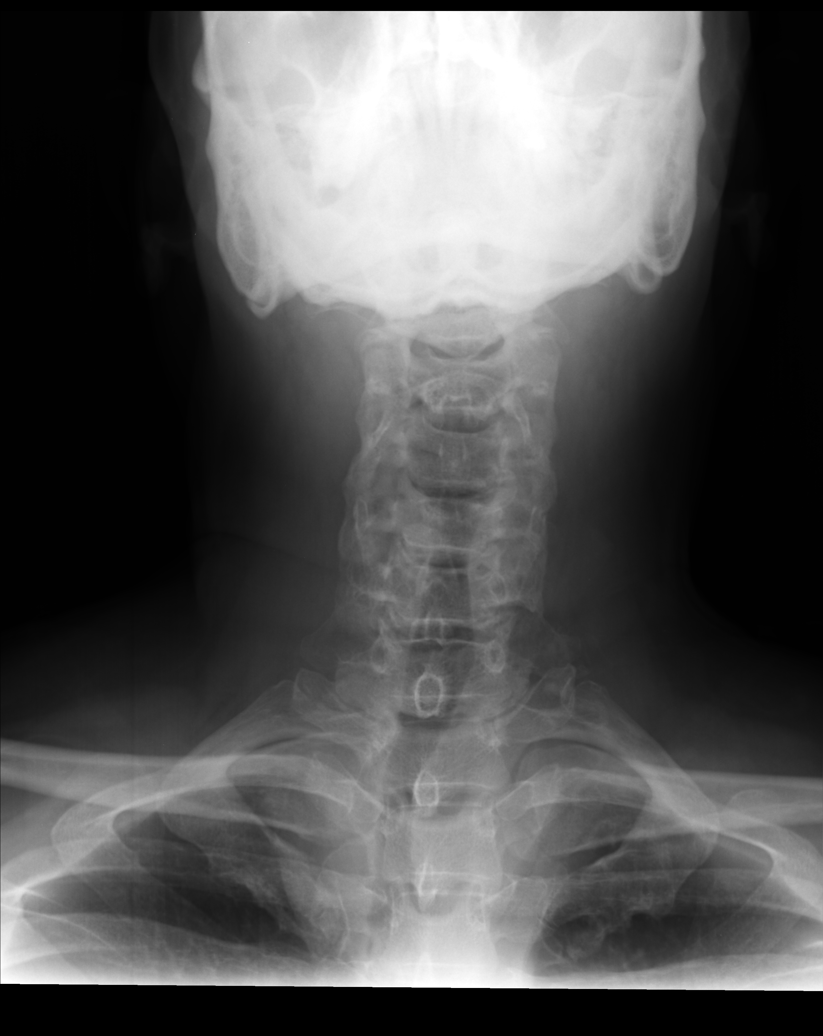

[swimmers]
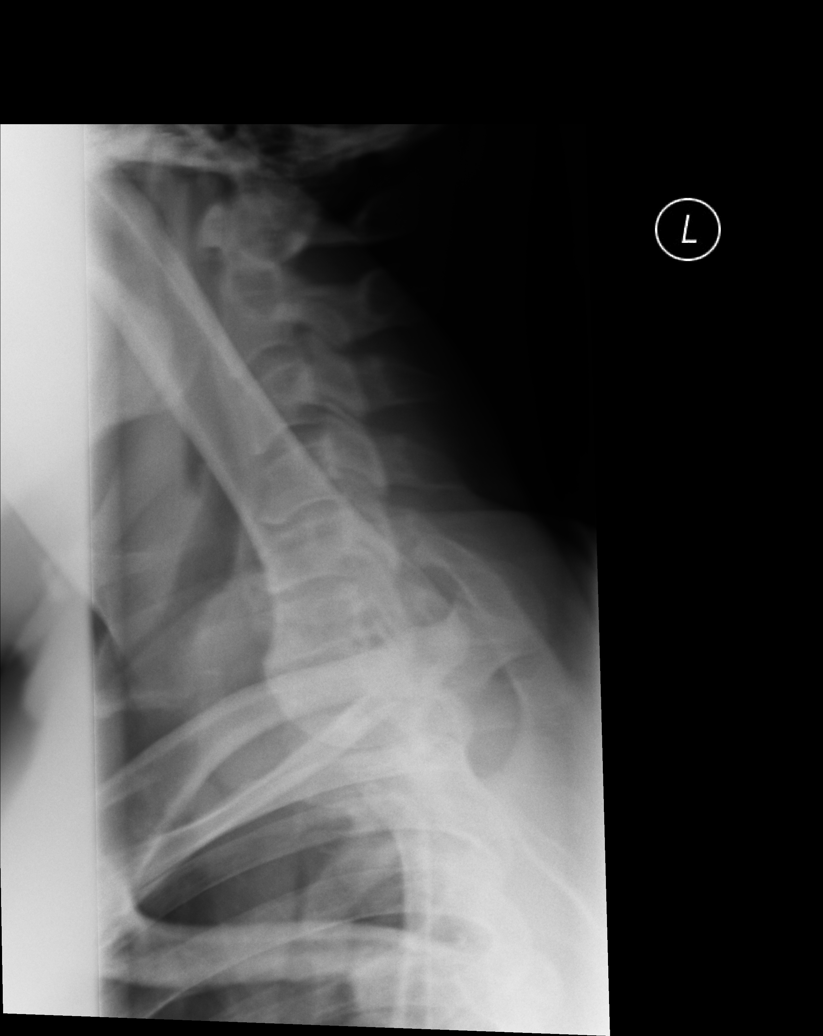

[ap open mouth (2 of 2)]
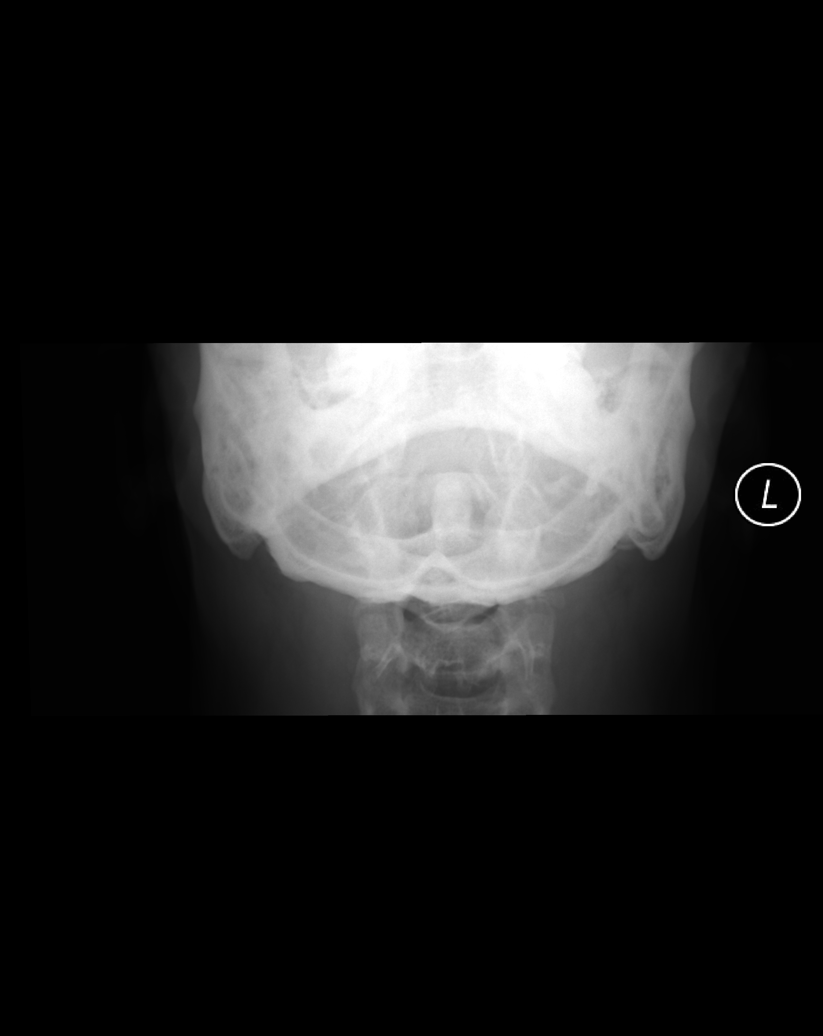

[5 of 5 positions shown; findings below may reference images not displayed]

FINDINGS: Vertebral body height and alignment are normal. Loss of disc space
height and endplate spurring are most notable at C4-5 and C6-7 but
unchanged. Prevertebral soft tissues appear normal.
IMPRESSION: No acute finding.

No change in degenerative disease C4-5 and C6-7.

## 2016-05-25 ENCOUNTER — Other Ambulatory Visit: Payer: Self-pay

## 2016-05-25 MED ORDER — HYDROCHLOROTHIAZIDE 25 MG PO TABS: 25.0000 mg | ORAL_TABLET | Freq: Every day | ORAL | 0 refills | Status: DC

## 2016-07-07 ENCOUNTER — Other Ambulatory Visit: Payer: Self-pay | Admitting: Urgent Care

## 2016-11-09 ENCOUNTER — Ambulatory Visit (INDEPENDENT_AMBULATORY_CARE_PROVIDER_SITE_OTHER): Payer: BC Managed Care – PPO | Admitting: Emergency Medicine

## 2016-11-09 ENCOUNTER — Ambulatory Visit (INDEPENDENT_AMBULATORY_CARE_PROVIDER_SITE_OTHER): Payer: BC Managed Care – PPO

## 2016-11-09 VITALS — BP 170/110 | HR 96 | Temp 98.1°F | Resp 18 | Ht 69.5 in | Wt 203.0 lb

## 2016-11-09 DIAGNOSIS — I10 Essential (primary) hypertension: Secondary | ICD-10-CM

## 2016-11-09 DIAGNOSIS — S39012A Strain of muscle, fascia and tendon of lower back, initial encounter: Secondary | ICD-10-CM | POA: Diagnosis not present

## 2016-11-09 DIAGNOSIS — W19XXXA Unspecified fall, initial encounter: Secondary | ICD-10-CM | POA: Diagnosis not present

## 2016-11-09 DIAGNOSIS — M62838 Other muscle spasm: Secondary | ICD-10-CM | POA: Diagnosis not present

## 2016-11-09 DIAGNOSIS — S99912A Unspecified injury of left ankle, initial encounter: Secondary | ICD-10-CM

## 2016-11-09 DIAGNOSIS — S93402A Sprain of unspecified ligament of left ankle, initial encounter: Secondary | ICD-10-CM | POA: Diagnosis not present

## 2016-11-09 MED ORDER — KETOROLAC TROMETHAMINE 60 MG/2ML IM SOLN
60.0000 mg | Freq: Once | INTRAMUSCULAR | Status: AC
  Administered 2016-11-09: 60 mg via INTRAMUSCULAR

## 2016-11-09 MED ORDER — CYCLOBENZAPRINE HCL 10 MG PO TABS: 10.0000 mg | ORAL_TABLET | Freq: Three times a day (TID) | ORAL | 0 refills | Status: AC | PRN

## 2016-11-09 MED ORDER — DICLOFENAC SODIUM 75 MG PO TBEC: 75.0000 mg | DELAYED_RELEASE_TABLET | Freq: Two times a day (BID) | ORAL | 0 refills | Status: DC

## 2016-11-09 NOTE — Patient Instructions (Addendum)
IF you received an x-ray today, you will receive an invoice from [facility]. Please contact [facility] at [phone] with questions or concerns regarding your invoice.   IF you received labwork today, you will receive an invoice from [place]. Please contact [facility] at [phone] with questions or concerns regarding your invoice.   Our billing staff will not be able to assist you with questions regarding bills from these companies.  You will be contacted with the lab results as soon as they are available. The fastest way to get your results is to activate your My Chart account. Instructions are located on the last page of this paperwork. If you have not heard from [place] regarding the results in 2 weeks, please contact this office.      Ankle Sprain Introduction An ankle sprain is a stretch or tear in one of the tough tissues (ligaments) in your ankle. Follow these instructions at home:  Rest your ankle.  Take over-the-counter and prescription medicines only as told by your doctor.  For 2-3 days, keep your ankle higher than the level of your heart (elevated) as much as possible.  If directed, put ice on the area:  Put ice in a plastic bag.  Place a towel between your skin and the bag.  Leave the ice on for 20 minutes, 2-3 times a day.  If you were given a brace:  Wear it as told.  Take it off to shower or bathe.  Try not to move your ankle much, but wiggle your toes from time to time. This helps to prevent swelling.  If you were given an elastic bandage (dressing):  Take it off when you shower or bathe.  Try not to move your ankle much, but wiggle your toes from time to time. This helps to prevent swelling.  Adjust the bandage to make it more comfortable if it feels too tight.  Loosen the bandage if you lose feeling in your foot, your foot tingles, or your foot gets cold and blue.  If you have crutches, use them as told by your doctor.  Continue to use them until you can walk without feeling pain in your ankle. Contact a doctor if:  Your bruises or swelling are quickly getting worse.  Your pain does not get better after you take medicine. Get help right away if:  You cannot feel your toes or foot.  Your toes or your foot looks blue.  You have very bad pain that gets worse. This information is not intended to replace advice given to you by your health care provider. Make sure you discuss any questions you have with your health care provider. Document Released: 04/04/2008 Document Revised: 03/24/2016 Document Reviewed: 05/19/2015  2017 Elsevier  Back Pain, Adult Introduction Back pain is very common. The pain often gets better over time. The cause of back pain is usually not dangerous. Most people can learn to manage their back pain on their own. Follow these instructions at home: Watch your back pain for any changes. The following actions may help to lessen any pain you are feeling:  Stay active. Start with short walks on flat ground if you can. Try to walk farther each day.  Exercise regularly as told by your doctor. Exercise helps your back heal faster. It also helps avoid future injury by keeping your muscles strong and flexible.  Do not sit, drive, or stand in one place for more than 30 minutes.  Do not stay in bed.  Resting more than 1-2 days can slow down your recovery.  Be careful when you bend or lift an object. Use good form when lifting:  Bend at your knees.  Keep the object close to your body.  Do not twist.  Sleep on a firm mattress. Lie on your side, and bend your knees. If you lie on your back, put a pillow under your knees.  Take medicines only as told by your doctor.  Put ice on the injured area.  Put ice in a plastic bag.  Place a towel between your skin and the bag.  Leave the ice on for 20 minutes, 2-3 times a day for the first 2-3 days. After that, you can switch between ice and  heat packs.  Avoid feeling anxious or stressed. Find good ways to deal with stress, such as exercise.  Maintain a healthy weight. Extra weight puts stress on your back. Contact a doctor if:  You have pain that does not go away with rest or medicine.  You have worsening pain that goes down into your legs or buttocks.  You have pain that does not get better in one week.  You have pain at night.  You lose weight.  You have a fever or chills. Get help right away if:  You cannot control when you poop (bowel movement) or pee (urinate).  Your arms or legs feel weak.  Your arms or legs lose feeling (numbness).  You feel sick to your stomach (nauseous) or throw up (vomit).  You have belly (abdominal) pain.  You feel like you may pass out (faint). This information is not intended to replace advice given to you by your health care provider. Make sure you discuss any questions you have with your health care provider. Document Released: 04/04/2008 Document Revised: 03/24/2016 Document Reviewed: 02/18/2014  2017 Elsevier

## 2016-11-09 NOTE — Progress Notes (Signed)
Ruben Camacho. 61 y.o.   Chief Complaint  Patient presents with  . Back Pain    fell in tub on Monday     HISTORY OF PRESENT ILLNESS: This is a 61 y.o. male complaining of low back pain and left ankle sprain  Back Pain  This is a new problem. The current episode started in the past 7 days (2 days ago). The problem occurs constantly. The problem has been gradually worsening since onset. The pain is present in the lumbar spine. The quality of the pain is described as aching. The pain does not radiate. The pain is at a severity of 8/10. The pain is moderate. The pain is the same all the time. The symptoms are aggravated by bending and position. Stiffness is present all day. Pertinent negatives include no abdominal pain, bladder incontinence, bowel incontinence, chest pain, dysuria, fever, leg pain, numbness, paresis, paresthesias, pelvic pain, perianal numbness, tingling, weakness or weight loss. He has tried analgesics, bed rest and ice for the symptoms. The treatment provided no relief.  Ankle Injury   The incident occurred 2 days ago. The incident occurred at home. The injury mechanism was a twisting injury. The pain is present in the left ankle. The quality of the pain is described as aching. The pain is at a severity of 8/10. The pain is moderate. The pain has been constant since onset. Associated symptoms include an inability to bear weight. Pertinent negatives include no numbness or tingling. The symptoms are aggravated by movement and weight bearing. He has tried ice for the symptoms. The treatment provided no relief.     Prior to Admission medications   Medication Sig Start Date End Date Taking? Authorizing Provider  amLODipine (NORVASC) 5 MG tablet Take 1 tablet (5 mg total) by mouth daily. 10/11/15  Yes Jaynee Eagles, PA-C  aspirin 81 MG tablet Take 81 mg by mouth daily.   Yes Historical Provider, MD  allopurinol (ZYLOPRIM) 300 MG tablet Take 1 tablet (300 mg total) by mouth daily.  Do not start during gout flair Patient not taking: Reported on 11/09/2016 03/15/16   Shawnee Knapp, MD  colchicine 0.6 MG tablet 1 tab bid til gout resolves then qd for 7 more days Patient not taking: Reported on 11/09/2016 03/09/16   Leandrew Koyanagi, MD  hydrochlorothiazide (HYDRODIURIL) 25 MG tablet Take 1 tablet (25 mg total) by mouth daily. Patient not taking: Reported on 11/09/2016 05/25/16   Jaynee Eagles, PA-C  methylPREDNISolone (MEDROL) 4 MG tablet 6-5-4-4-3-3-2-2-1-1 tabs po qd Patient not taking: Reported on 11/09/2016 03/15/16   Shawnee Knapp, MD    No Known Allergies  Patient Active Problem List   Diagnosis Date Noted  . Intractable migraine without aura and without status migrainosus 01/08/2015  . Hypertension 11/30/2011  . Hyperlipemia 11/30/2011  . Gout 11/30/2011  . Beta thalassemia ([place]) 11/30/2011    Past Medical History:  Diagnosis Date  . Gout    1-2 exacerbations per year.  Takes Advil PRN for pain.  Marland Kitchen Hyperlipidemia   . Hypertension   . Migraine   . Stroke ([place]) 07/01/2012   L sided weakness, slurred speech.  Admission [facility].      Past Surgical History:  Procedure Laterality Date  . Admission  07/01/2012   CVA (L sided weakness, slurred speech).  [facility].  . COLONOSCOPY  10/31/2008   no polyps.  Repeat in 10 years.  [place].  Marland Kitchen HERNIA REPAIR      Social History  Social History  . Marital status: Married    Spouse name: N/A  . Number of children: 2  . Years of education: Masters   Occupational History  . [employer]   Social History Main Topics  . Smoking status: Never Smoker  . Smokeless tobacco: Never Used  . Alcohol use No     Comment: former drinker  . Drug use: No  . Sexual activity: Yes    Partners: Female   Other Topics Concern  . Not on file   Social History Narrative   Marital status: married      CHildren;  2 children; no grandchildren.      Lives at home alone.      Employment:  Pharmacist, hospital; principal in past.       Tobacco: none      Alcohol: weekends      Exercise:  Walking; weightlifting; basketball.   Right handed.   2 cups caffeine/day.    Family History  Problem Relation Age of Onset  . Stroke Mother   . Hypertension Mother   . Hypertension Daughter   . Cancer Maternal Grandmother     lung  . Healthy Father     Died in motorcycle accident     Review of Systems  Constitutional: Negative for fever and weight loss.  HENT: Negative.   Eyes: Negative.   Respiratory: Negative.  Negative for cough and shortness of breath.   Cardiovascular: Negative.  Negative for chest pain and palpitations.  Gastrointestinal: Negative.  Negative for abdominal pain, bowel incontinence, nausea and vomiting.  Genitourinary: Negative for bladder incontinence, dysuria, frequency, hematuria and pelvic pain.  Musculoskeletal: Positive for back pain and joint pain (left ankle). Negative for neck pain.  Skin: Negative.   Neurological: Negative for tingling, sensory change, speech change, focal weakness, weakness, numbness and paresthesias.  Endo/Heme/Allergies: Negative.   Psychiatric/Behavioral: Negative.   All other systems reviewed and are negative.  Vitals:   11/09/16 0759  BP: (!) 170/92  Pulse: 96  Resp: 18  Temp: 98.1 F (36.7 C)  BP elevated most likely because of pain.   Physical Exam  Constitutional: He is oriented to person, place, and time. He appears well-developed and well-nourished.  HENT:  Head: Normocephalic and atraumatic.  Eyes: Conjunctivae and EOM are normal. Pupils are equal, round, and reactive to light.  Neck: Normal range of motion. Neck supple.  Cardiovascular: Normal rate, regular rhythm and normal heart sounds.   Pulmonary/Chest: Effort normal and breath sounds normal.  Abdominal: Soft. Bowel sounds are normal. He exhibits no distension. There is no tenderness.  Musculoskeletal:  Back: lumbar area with tenderness and spasm with LROM Left Ankle: +tenderness with mild  swelling and LROM  Neurological: He is alert and oriented to person, place, and time. He displays normal reflexes. No sensory deficit. He exhibits normal muscle tone.  Skin: Skin is warm and dry. Capillary refill takes less than 2 seconds.  Psychiatric: He has a normal mood and affect.  Vitals reviewed.  X-ray reviewed; possible tiny avulsion Fx; treatment same as sprain.  ASSESSMENT & PLAN: Ruben Camacho was seen today for back pain.  Diagnoses and all orders for this visit:  Back strain, initial encounter -     DG Ankle Complete Left; Future -     ketorolac (TORADOL) injection 60 mg; Inject 2 mLs (60 mg total) into the muscle once. -     Apply ankle splint air cast  Muscle spasm  Sprain of left ankle,  unspecified ligament, initial encounter -     Ambulatory referral to Orthopedic Surgery  Accidental fall, initial encounter  Injury of left ankle, initial encounter  Other orders -     cyclobenzaprine (FLEXERIL) 10 MG tablet; Take 1 tablet (10 mg total) by mouth 3 (three) times daily as needed for muscle spasms. -     diclofenac (VOLTAREN) 75 MG EC tablet; Take 1 tablet (75 mg total) by mouth 2 (two) times daily.   Possible tiny ankle avulsion Fx.  Patient Instructions       IF you received an x-ray today, you will receive an invoice from [facility]. Please contact [facility] at [phone] with questions or concerns regarding your invoice.   IF you received labwork today, you will receive an invoice from [place]. Please contact [facility] at [phone] with questions or concerns regarding your invoice.   Our billing staff will not be able to assist you with questions regarding bills from these companies.  You will be contacted with the lab results as soon as they are available. The fastest way to get your results is to activate your My Chart account. Instructions are located on the last page of this paperwork. If you have not heard from [place] regarding the  results in 2 weeks, please contact this office.      Ankle Sprain Introduction An ankle sprain is a stretch or tear in one of the tough tissues (ligaments) in your ankle. Follow these instructions at home:  Rest your ankle.  Take over-the-counter and prescription medicines only as told by your doctor.  For 2-3 days, keep your ankle higher than the level of your heart (elevated) as much as possible.  If directed, put ice on the area:  Put ice in a plastic bag.  Place a towel between your skin and the bag.  Leave the ice on for 20 minutes, 2-3 times a day.  If you were given a brace:  Wear it as told.  Take it off to shower or bathe.  Try not to move your ankle much, but wiggle your toes from time to time. This helps to prevent swelling.  If you were given an elastic bandage (dressing):  Take it off when you shower or bathe.  Try not to move your ankle much, but wiggle your toes from time to time. This helps to prevent swelling.  Adjust the bandage to make it more comfortable if it feels too tight.  Loosen the bandage if you lose feeling in your foot, your foot tingles, or your foot gets cold and blue.  If you have crutches, use them as told by your doctor. Continue to use them until you can walk without feeling pain in your ankle. Contact a doctor if:  Your bruises or swelling are quickly getting worse.  Your pain does not get better after you take medicine. Get help right away if:  You cannot feel your toes or foot.  Your toes or your foot looks blue.  You have very bad pain that gets worse. This information is not intended to replace advice given to you by your health care provider. Make sure you discuss any questions you have with your health care provider. Document Released: 04/04/2008 Document Revised: 03/24/2016 Document Reviewed: 05/19/2015  2017 Elsevier  Back Pain, Adult Introduction Back pain is very common. The pain often gets better over time.  The cause of back pain is usually not dangerous. Most people can learn to manage their back pain on  their own. Follow these instructions at home: Watch your back pain for any changes. The following actions may help to lessen any pain you are feeling:  Stay active. Start with short walks on flat ground if you can. Try to walk farther each day.  Exercise regularly as told by your doctor. Exercise helps your back heal faster. It also helps avoid future injury by keeping your muscles strong and flexible.  Do not sit, drive, or stand in one place for more than 30 minutes.  Do not stay in bed. Resting more than 1-2 days can slow down your recovery.  Be careful when you bend or lift an object. Use good form when lifting:  Bend at your knees.  Keep the object close to your body.  Do not twist.  Sleep on a firm mattress. Lie on your side, and bend your knees. If you lie on your back, put a pillow under your knees.  Take medicines only as told by your doctor.  Put ice on the injured area.  Put ice in a plastic bag.  Place a towel between your skin and the bag.  Leave the ice on for 20 minutes, 2-3 times a day for the first 2-3 days. After that, you can switch between ice and heat packs.  Avoid feeling anxious or stressed. Find good ways to deal with stress, such as exercise.  Maintain a healthy weight. Extra weight puts stress on your back. Contact a doctor if:  You have pain that does not go away with rest or medicine.  You have worsening pain that goes down into your legs or buttocks.  You have pain that does not get better in one week.  You have pain at night.  You lose weight.  You have a fever or chills. Get help right away if:  You cannot control when you poop (bowel movement) or pee (urinate).  Your arms or legs feel weak.  Your arms or legs lose feeling (numbness).  You feel sick to your stomach (nauseous) or throw up (vomit).  You have belly (abdominal)  pain.  You feel like you may pass out (faint). This information is not intended to replace advice given to you by your health care provider. Make sure you discuss any questions you have with your health care provider. Document Released: 04/04/2008 Document Revised: 03/24/2016 Document Reviewed: 02/18/2014  2017 Elsevier      Agustina Caroli, MD [facility]

## 2016-11-25 ENCOUNTER — Other Ambulatory Visit: Payer: Self-pay | Admitting: Urgent Care

## 2016-11-25 DIAGNOSIS — I10 Essential (primary) hypertension: Secondary | ICD-10-CM

## 2016-11-25 NOTE — Telephone Encounter (Signed)
Meds ordered this encounter  Medications  . amLODipine (NORVASC) 5 MG tablet    Sig: TAKE 1 TABLET BY MOUTH EVERY DAY    Dispense:  90 tablet    Refill:  0

## 2016-12-07 ENCOUNTER — Other Ambulatory Visit: Payer: Self-pay | Admitting: Emergency Medicine

## 2016-12-07 NOTE — Telephone Encounter (Signed)
Last OV for arthritis was 02/2016. Patient will be due for f/u. I wrote in pharmacy note that patient is not to take this with another NSAID. I tried calling patient but was unable to reach him and could not leave a voice message.

## 2017-01-05 ENCOUNTER — Ambulatory Visit (INDEPENDENT_AMBULATORY_CARE_PROVIDER_SITE_OTHER): Payer: BC Managed Care – PPO | Admitting: Emergency Medicine

## 2017-01-05 ENCOUNTER — Ambulatory Visit (INDEPENDENT_AMBULATORY_CARE_PROVIDER_SITE_OTHER): Payer: BC Managed Care – PPO

## 2017-01-05 VITALS — BP 156/100 | HR 100 | Temp 99.2°F | Resp 16 | Ht 70.0 in | Wt 202.8 lb

## 2017-01-05 DIAGNOSIS — M109 Gout, unspecified: Secondary | ICD-10-CM

## 2017-01-05 DIAGNOSIS — M25472 Effusion, left ankle: Secondary | ICD-10-CM

## 2017-01-05 DIAGNOSIS — M25572 Pain in left ankle and joints of left foot: Secondary | ICD-10-CM | POA: Diagnosis not present

## 2017-01-05 MED ORDER — COLCHICINE 0.6 MG PO TABS: ORAL_TABLET | ORAL | 2 refills | Status: DC

## 2017-01-05 MED ORDER — DICLOFENAC SODIUM 75 MG PO TBEC: 75.0000 mg | DELAYED_RELEASE_TABLET | Freq: Two times a day (BID) | ORAL | 0 refills | Status: DC

## 2017-01-05 NOTE — Progress Notes (Signed)
Ruben Camacho. 61 y.o.   Chief Complaint  Patient presents with  . left ankle pain    started x 6 days ago    HISTORY OF PRESENT ILLNESS: This is a 61 y.o. male complaining of left ankle pain and swelling; has h/o gout and it feels like a flare.  Ankle Pain   The incident occurred 3 to 5 days ago. There was no injury mechanism. The pain is present in the left ankle. The quality of the pain is described as aching and burning. The pain is at a severity of 6/10. The pain is moderate. The pain has been constant since onset. Associated symptoms include an inability to bear weight and a loss of motion. Pertinent negatives include no loss of sensation, muscle weakness, numbness or tingling. The symptoms are aggravated by weight bearing. He has tried nothing for the symptoms.     Prior to Admission medications   Medication Sig Start Date End Date Taking? Authorizing Provider  amLODipine (NORVASC) 5 MG tablet TAKE 1 TABLET BY MOUTH EVERY DAY 11/25/16  Yes Chelle Jeffery, PA-C  aspirin 81 MG tablet Take 81 mg by mouth daily.   Yes Historical Provider, MD  allopurinol (ZYLOPRIM) 300 MG tablet Take 1 tablet (300 mg total) by mouth daily. Do not start during gout flair Patient not taking: Reported on 11/09/2016 03/15/16   Shawnee Knapp, MD  colchicine 0.6 MG tablet 1 tab bid til gout resolves then qd for 7 more days Patient not taking: Reported on 11/09/2016 03/09/16   Leandrew Koyanagi, MD  diclofenac (VOLTAREN) 75 MG EC tablet TAKE 1 TABLET(75 MG) BY MOUTH TWICE DAILY Patient not taking: Reported on 01/05/2017 12/07/16   Jaynee Eagles, PA-C    No Known Allergies  Patient Active Problem List   Diagnosis Date Noted  . Strain of back 11/09/2016  . Muscle spasm 11/09/2016  . Sprain of left ankle 11/09/2016  . Intractable migraine without aura and without status migrainosus 01/08/2015  . Hypertension 11/30/2011  . Hyperlipemia 11/30/2011  . Gout 11/30/2011  . Beta thalassemia ([place]) 11/30/2011     Past Medical History:  Diagnosis Date  . Gout    1-2 exacerbations per year.  Takes Advil PRN for pain.  Marland Kitchen Hyperlipidemia   . Hypertension   . Migraine   . Stroke ([place]) 07/01/2012   L sided weakness, slurred speech.  Admission [facility].      Past Surgical History:  Procedure Laterality Date  . Admission  07/01/2012   CVA (L sided weakness, slurred speech).  [facility].  . COLONOSCOPY  10/31/2008   no polyps.  Repeat in 10 years.  [place].  Marland Kitchen HERNIA REPAIR      Social History   Social History  . Marital status: Married    Spouse name: N/A  . Number of children: 2  . Years of education: Masters   Occupational History  . [employer]   Social History Main Topics  . Smoking status: Never Smoker  . Smokeless tobacco: Never Used  . Alcohol use No     Comment: former drinker  . Drug use: No  . Sexual activity: Yes    Partners: Female   Other Topics Concern  . Not on file   Social History Narrative   Marital status: married      CHildren;  2 children; no grandchildren.      Lives at home alone.      Employment:  Pharmacist, hospital; principal in past.  Tobacco: none      Alcohol: weekends      Exercise:  Walking; weightlifting; basketball.   Right handed.   2 cups caffeine/day.    Family History  Problem Relation Age of Onset  . Stroke Mother   . Hypertension Mother   . Hypertension Daughter   . Cancer Maternal Grandmother     lung  . Healthy Father     Died in motorcycle accident     Review of Systems  Constitutional: Negative for chills and fever.  Eyes: Negative for discharge and redness.  Gastrointestinal: Negative for nausea and vomiting.  Genitourinary: Negative for hematuria.  Musculoskeletal: Positive for joint pain (left ankle).  Skin: Negative for rash.  Neurological: Negative for tingling and numbness.  All other systems reviewed and are negative.  Vitals:   01/05/17 0939 01/05/17 0959  BP: (!) 150/90 (!) 156/100  Pulse: 100    Resp: 16   Temp: 99.2 F (37.3 C)     Physical Exam  Constitutional: He is oriented to person, place, and time. He appears well-developed and well-nourished.  HENT:  Head: Normocephalic and atraumatic.  Eyes: EOM are normal. Pupils are equal, round, and reactive to light.  Neck: Normal range of motion. Neck supple.  Cardiovascular: Normal rate and regular rhythm.   Pulmonary/Chest: Effort normal and breath sounds normal.  Musculoskeletal:  Left ankle: no bruising, +swelling and tenderness lateral malleolus; NVI with LROM due to swelling and pain.  Neurological: He is alert and oriented to person, place, and time. No sensory deficit. He exhibits normal muscle tone.  Skin: Skin is warm and dry. Capillary refill takes less than 2 seconds.  Psychiatric: He has a normal mood and affect. His behavior is normal.  Vitals reviewed.    ASSESSMENT & PLAN: Randle was seen today for left ankle pain.  Diagnoses and all orders for this visit:  Left ankle swelling -     DG Ankle Complete Left; Future  Acute left ankle pain -     DG Ankle Complete Left; Future  Acute gouty arthritis  Other orders -     colchicine 0.6 MG tablet; 1 tab bid til gout resolves then qd for 7 more days -     diclofenac (VOLTAREN) 75 MG EC tablet; Take 1 tablet (75 mg total) by mouth 2 (two) times daily.    Patient Instructions       IF you received an x-ray today, you will receive an invoice from [facility]. Please contact [facility] at [phone] with questions or concerns regarding your invoice.   IF you received labwork today, you will receive an invoice from [place] [facility]. Please contact [facility] at [phone] with questions or concerns regarding your invoice.   Our billing staff will not be able to assist you with questions regarding bills from these companies.  You will be contacted with the lab results as soon as they are available. The fastest way to get your results is to  activate your My Chart account. Instructions are located on the last page of this paperwork. If you have not heard from [place] regarding the results in 2 weeks, please contact this office.     Gout Gout is painful swelling that can happen in some of your joints. Gout is a type of arthritis. This condition is caused by having too much uric acid in your body. Uric acid is a chemical that is made when your body breaks down substances called purines. If your body has too  much uric acid, sharp crystals can form and build up in your joints. This causes pain and swelling. Gout attacks can happen quickly and be very painful (acute gout). Over time, the attacks can affect more joints and happen more often (chronic gout). Follow these instructions at home: During a Gout Attack   If directed, put ice on the painful area:  Put ice in a plastic bag.  Place a towel between your skin and the bag.  Leave the ice on for 20 minutes, 2-3 times a day.  Rest the joint as much as possible. If the joint is in your leg, you may be given crutches to use.  Raise (elevate) the painful joint above the level of your heart as often as you can.  Drink enough fluids to keep your pee (urine) clear or pale yellow.  Take over-the-counter and prescription medicines only as told by your doctor.  Do not drive or use heavy machinery while taking prescription pain medicine.  Follow instructions from your doctor about what you can or cannot eat and drink.  Return to your normal activities as told by your doctor. Ask your doctor what activities are safe for you. Avoiding Future Gout Attacks   Follow a low-purine diet as told by a specialist (dietitian) or your doctor. Avoid foods and drinks that have a lot of purines, such as:  Liver.  Kidney.  Anchovies.  Asparagus.  Herring.  Mushrooms  Mussels.  Beer.  Limit alcohol intake to no more than 1 drink a day for nonpregnant women and 2 drinks a day for men. One  drink equals 12 oz of beer, 5 oz of wine, or 1 oz of hard liquor.  Stay at a healthy weight or lose weight if you are overweight. If you want to lose weight, talk with your doctor. It is important that you do not lose weight too fast.  Start or continue an exercise plan as told by your doctor.  Drink enough fluids to keep your pee clear or pale yellow.  Take over-the-counter and prescription medicines only as told by your doctor.  Keep all follow-up visits as told by your doctor. This is important. Contact a doctor if:  You have another gout attack.  You still have symptoms of a gout attack after10 days of treatment.  You have problems (side effects) because of your medicines.  You have chills or a fever.  You have burning pain when you pee (urinate).  You have pain in your lower back or belly. Get help right away if:  You have very bad pain.  Your pain cannot be controlled.  You cannot pee. This information is not intended to replace advice given to you by your health care provider. Make sure you discuss any questions you have with your health care provider. Document Released: 07/26/2008 Document Revised: 03/24/2016 Document Reviewed: 07/30/2015 Elsevier Interactive Patient Education  2017 Elsevier Inc.      Agustina Caroli, MD [facility]

## 2017-01-05 NOTE — Patient Instructions (Addendum)
   IF you received an x-ray today, you will receive an invoice from [facility]. Please contact [facility] at [phone] with questions or concerns regarding your invoice.   IF you received labwork today, you will receive an invoice from [facility]. Please contact [facility] at [phone] with questions or concerns regarding your invoice.   Our billing staff will not be able to assist you with questions regarding bills from these companies.  You will be contacted with the lab results as soon as they are available. The fastest way to get your results is to activate your My Chart account. Instructions are located on the last page of this paperwork. If you have not heard from us regarding the results in 2 weeks, please contact this office.     Gout Gout is painful swelling that can happen in some of your joints. Gout is a type of arthritis. This condition is caused by having too much uric acid in your body. Uric acid is a chemical that is made when your body breaks down substances called purines. If your body has too much uric acid, sharp crystals can form and build up in your joints. This causes pain and swelling. Gout attacks can happen quickly and be very painful (acute gout). Over time, the attacks can affect more joints and happen more often (chronic gout). Follow these instructions at home: During a Gout Attack   If directed, put ice on the painful area:  Put ice in a plastic bag.  Place a towel between your skin and the bag.  Leave the ice on for 20 minutes, 2-3 times a day.  Rest the joint as much as possible. If the joint is in your leg, you may be given crutches to use.  Raise (elevate) the painful joint above the level of your heart as often as you can.  Drink enough fluids to keep your pee (urine) clear or pale yellow.  Take over-the-counter and prescription medicines only as told by your doctor.  Do not drive or use heavy machinery while taking  prescription pain medicine.  Follow instructions from your doctor about what you can or cannot eat and drink.  Return to your normal activities as told by your doctor. Ask your doctor what activities are safe for you. Avoiding Future Gout Attacks   Follow a low-purine diet as told by a specialist (dietitian) or your doctor. Avoid foods and drinks that have a lot of purines, such as:  Liver.  Kidney.  Anchovies.  Asparagus.  Herring.  Mushrooms  Mussels.  Beer.  Limit alcohol intake to no more than 1 drink a day for nonpregnant women and 2 drinks a day for men. One drink equals 12 oz of beer, 5 oz of wine, or 1 oz of hard liquor.  Stay at a healthy weight or lose weight if you are overweight. If you want to lose weight, talk with your doctor. It is important that you do not lose weight too fast.  Start or continue an exercise plan as told by your doctor.  Drink enough fluids to keep your pee clear or pale yellow.  Take over-the-counter and prescription medicines only as told by your doctor.  Keep all follow-up visits as told by your doctor. This is important. Contact a doctor if:  You have another gout attack.  You still have symptoms of a gout attack after10 days of treatment.  You have problems (side effects) because of your medicines.  You have chills or a   fever.  You have burning pain when you pee (urinate).  You have pain in your lower back or belly. Get help right away if:  You have very bad pain.  Your pain cannot be controlled.  You cannot pee. This information is not intended to replace advice given to you by your health care provider. Make sure you discuss any questions you have with your health care provider. Document Released: 07/26/2008 Document Revised: 03/24/2016 Document Reviewed: 07/30/2015 Elsevier Interactive Patient Education  2017 Elsevier Inc.  

## 2017-01-19 ENCOUNTER — Other Ambulatory Visit: Payer: Self-pay | Admitting: Physician Assistant

## 2017-01-19 DIAGNOSIS — I10 Essential (primary) hypertension: Secondary | ICD-10-CM

## 2017-01-22 ENCOUNTER — Other Ambulatory Visit: Payer: Self-pay | Admitting: Emergency Medicine

## 2017-05-01 ENCOUNTER — Other Ambulatory Visit: Payer: Self-pay | Admitting: Urgent Care

## 2017-05-01 DIAGNOSIS — I10 Essential (primary) hypertension: Secondary | ICD-10-CM

## 2017-05-02 ENCOUNTER — Ambulatory Visit (INDEPENDENT_AMBULATORY_CARE_PROVIDER_SITE_OTHER): Payer: Self-pay | Admitting: Emergency Medicine

## 2017-05-02 ENCOUNTER — Encounter: Payer: Self-pay | Admitting: Emergency Medicine

## 2017-05-02 VITALS — BP 211/117 | HR 70 | Temp 98.1°F | Resp 18 | Ht 69.8 in | Wt 202.0 lb

## 2017-05-02 DIAGNOSIS — I10 Essential (primary) hypertension: Secondary | ICD-10-CM

## 2017-05-02 MED ORDER — AMLODIPINE BESYLATE 5 MG PO TABS: 5.0000 mg | ORAL_TABLET | Freq: Every day | ORAL | 3 refills | Status: DC

## 2017-05-02 NOTE — Patient Instructions (Addendum)
IF you received an x-ray today, you will receive an invoice from [facility]. Please contact [facility] at [phone] with questions or concerns regarding your invoice.   IF you received labwork today, you will receive an invoice from [facility]. Please contact [facility] at [phone] with questions or concerns regarding your invoice.   Our billing staff will not be able to assist you with questions regarding bills from these companies.  You will be contacted with the lab results as soon as they are available. The fastest way to get your results is to activate your My Chart account. Instructions are located on the last page of this paperwork. If you have not heard from [place] regarding the results in 2 weeks, please contact this office.      Hypertension Hypertension is another name for high blood pressure. High blood pressure forces your heart to work harder to pump blood. This can cause problems over time. There are two numbers in a blood pressure reading. There is a top number (systolic) over a bottom number (diastolic). It is best to have a blood pressure below 120/80. Healthy choices can help lower your blood pressure. You may need medicine to help lower your blood pressure if:  Your blood pressure cannot be lowered with healthy choices.  Your blood pressure is higher than 130/80.  Follow these instructions at home: Eating and drinking  If directed, follow the DASH eating plan. This diet includes: ? Filling half of your plate at each meal with fruits and vegetables. ? Filling one quarter of your plate at each meal with whole grains. Whole grains include whole wheat pasta, brown rice, and whole grain bread. ? Eating or drinking low-fat dairy products, such as skim milk or low-fat yogurt. ? Filling one quarter of your plate at each meal with low-fat (lean) proteins. Low-fat proteins include fish, skinless chicken, eggs, beans, and tofu. ? Avoiding fatty meat,  cured and processed meat, or chicken with skin. ? Avoiding premade or processed food.  Eat less than 1,500 mg of salt (sodium) a day.  Limit alcohol use to no more than 1 drink a day for nonpregnant women and 2 drinks a day for men. One drink equals 12 oz of beer, 5 oz of wine, or 1 oz of hard liquor. Lifestyle  Work with your doctor to stay at a healthy weight or to lose weight. Ask your doctor what the best weight is for you.  Get at least 30 minutes of exercise that causes your heart to beat faster (aerobic exercise) most days of the week. This may include walking, swimming, or biking.  Get at least 30 minutes of exercise that strengthens your muscles (resistance exercise) at least 3 days a week. This may include lifting weights or pilates.  Do not use any products that contain nicotine or tobacco. This includes cigarettes and e-cigarettes. If you need help quitting, ask your doctor.  Check your blood pressure at home as told by your doctor.  Keep all follow-up visits as told by your doctor. This is important. Medicines  Take over-the-counter and prescription medicines only as told by your doctor. Follow directions carefully.  Do not skip doses of blood pressure medicine. The medicine does not work as well if you skip doses. Skipping doses also puts you at risk for problems.  Ask your doctor about side effects or reactions to medicines that you should watch for. Contact a doctor if:  You think you are having a reaction to  the medicine you are taking.  You have headaches that keep coming back (recurring).  You feel dizzy.  You have swelling in your ankles.  You have trouble with your vision. Get help right away if:  You get a very bad headache.  You start to feel confused.  You feel weak or numb.  You feel faint.  You get very bad pain in your: ? Chest. ? Belly (abdomen).  You throw up (vomit) more than once.  You have trouble  breathing. Summary  Hypertension is another name for high blood pressure.  Making healthy choices can help lower blood pressure. If your blood pressure cannot be controlled with healthy choices, you may need to take medicine. This information is not intended to replace advice given to you by your health care provider. Make sure you discuss any questions you have with your health care provider. Document Released: 04/04/2008 Document Revised: 09/14/2016 Document Reviewed: 09/14/2016 Elsevier Interactive Patient Education  [employer].

## 2017-05-02 NOTE — Progress Notes (Signed)
Spoke with Philis Fendt, PA regarding patient reading. Per PA Clark, patient to take his medications as he is not having any HTN sx at this time./ S.Dyron Kawano,CMA

## 2017-05-02 NOTE — Progress Notes (Signed)
Ruben Camacho. 61 y.o.   Chief Complaint  Patient presents with  . Medication Refill    Norvasc- pt states that he has not taken med X 1 week    HISTORY OF PRESENT ILLNESS: This is a 61 y.o. male with h/o HTN, out of Norvasc; off med x 1 week; asymptomatic.  HPI   Prior to Admission medications   Medication Sig Start Date End Date Taking? Authorizing Provider  amLODipine (NORVASC) 5 MG tablet Take 1 tablet (5 mg total) by mouth daily. Office visit needed for additional refills. 1st notice. 01/19/17  Yes Jaynee Eagles, PA-C  aspirin 81 MG tablet Take 81 mg by mouth daily.   Yes [provider]  allopurinol (ZYLOPRIM) 300 MG tablet Take 1 tablet (300 mg total) by mouth daily. Do not start during gout flair Patient not taking: Reported on 11/09/2016 03/15/16   Shawnee Knapp, MD  colchicine 0.6 MG tablet 1 tab bid til gout resolves then qd for 7 more days Patient not taking: Reported on 05/02/2017 01/05/17   Horald Pollen, MD  diclofenac (VOLTAREN) 75 MG EC tablet TAKE 1 TABLET(75 MG) BY MOUTH TWICE DAILY Patient not taking: Reported on 05/02/2017 01/23/17   Harrison Mons, PA-C    No Known Allergies  Patient Active Problem List   Diagnosis Date Noted  . Sprain of left ankle 11/09/2016  . Intractable migraine without aura and without status migrainosus 01/08/2015  . Hypertension 11/30/2011  . Hyperlipemia 11/30/2011  . Gout 11/30/2011  . Beta thalassemia ([place]) 11/30/2011    Past Medical History:  Diagnosis Date  . Gout    1-2 exacerbations per year.  Takes Advil PRN for pain.  Marland Kitchen Hyperlipidemia   . Hypertension   . Migraine   . Stroke ([place]) 07/01/2012   L sided weakness, slurred speech.  Admission [facility].      Past Surgical History:  Procedure Laterality Date  . Admission  07/01/2012   CVA (L sided weakness, slurred speech).  [facility].  . COLONOSCOPY  10/31/2008   no polyps.  Repeat in 10 years.  [place].  Marland Kitchen HERNIA REPAIR      Social History   Social  History  . Marital status: Married    Spouse name: N/A  . Number of children: 2  . Years of education: Masters   Occupational History  . [employer]   Social History Main Topics  . Smoking status: Never Smoker  . Smokeless tobacco: Never Used  . Alcohol use No     Comment: former drinker  . Drug use: No  . Sexual activity: Yes    Partners: Female   Other Topics Concern  . Not on file   Social History Narrative   Marital status: married      CHildren;  2 children; no grandchildren.      Lives at home alone.      Employment:  Pharmacist, hospital; principal in past.      Tobacco: none      Alcohol: weekends      Exercise:  Walking; weightlifting; basketball.   Right handed.   2 cups caffeine/day.    Family History  Problem Relation Age of Onset  . Stroke Mother   . Hypertension Mother   . Hypertension Daughter   . Cancer Maternal Grandmother        lung  . Healthy Father        Died in motorcycle accident     Review of Systems  Constitutional: Negative.  Negative for chills and fever.  HENT: Negative.  Negative for congestion, nosebleeds and sore throat.   Eyes: Negative.  Negative for blurred vision and double vision.  Respiratory: Negative.  Negative for cough and shortness of breath.   Cardiovascular: Negative.  Negative for chest pain, palpitations and leg swelling.  Gastrointestinal: Negative.  Negative for abdominal pain, diarrhea, nausea and vomiting.  Genitourinary: Negative for dysuria and flank pain.  Skin: Negative.  Negative for rash.  Neurological: Negative for dizziness, sensory change, focal weakness and headaches.  Endo/Heme/Allergies: Negative.   All other systems reviewed and are negative.  Vitals:   05/02/17 1112  BP: (!) 208/111  Pulse: 70  Resp: 18  Temp: 98.1 F (36.7 C)   Repeat manual BP: 190/92  Physical Exam  Constitutional: He is oriented to person, place, and time. He appears well-developed and well-nourished.  HENT:   Head: Normocephalic and atraumatic.  Mouth/Throat: Oropharynx is clear and moist.  Eyes: Conjunctivae and EOM are normal. Pupils are equal, round, and reactive to light.  Neck: Normal range of motion. Neck supple. No JVD present. Carotid bruit is not present. No thyromegaly present.  Cardiovascular: Normal rate, regular rhythm, normal heart sounds and intact distal pulses.   Pulmonary/Chest: Effort normal and breath sounds normal.  Abdominal: Soft. Bowel sounds are normal. There is no tenderness.  Musculoskeletal: Normal range of motion. He exhibits no edema or tenderness.  Lymphadenopathy:    He has no cervical adenopathy.  Neurological: He is alert and oriented to person, place, and time. No sensory deficit. He exhibits normal muscle tone.  Skin: Skin is warm and dry. Capillary refill takes less than 2 seconds. No rash noted.  Psychiatric: He has a normal mood and affect. His behavior is normal.  Vitals reviewed.    ASSESSMENT & PLAN: Ruben Camacho was seen today for medication refill.  Diagnoses and all orders for this visit:  Essential hypertension -     amLODipine (NORVASC) 5 MG tablet; Take 1 tablet (5 mg total) by mouth daily.  Hypertension, uncontrolled    Patient Instructions       IF you received an x-ray today, you will receive an invoice from [facility]. Please contact [facility] at [phone] with questions or concerns regarding your invoice.   IF you received labwork today, you will receive an invoice from [facility]. Please contact [facility] at [phone] with questions or concerns regarding your invoice.   Our billing staff will not be able to assist you with questions regarding bills from these companies.  You will be contacted with the lab results as soon as they are available. The fastest way to get your results is to activate your My Chart account. Instructions are located on the last page of this paperwork. If you have not heard from [place]  regarding the results in 2 weeks, please contact this office.      Hypertension Hypertension is another name for high blood pressure. High blood pressure forces your heart to work harder to pump blood. This can cause problems over time. There are two numbers in a blood pressure reading. There is a top number (systolic) over a bottom number (diastolic). It is best to have a blood pressure below 120/80. Healthy choices can help lower your blood pressure. You may need medicine to help lower your blood pressure if:  Your blood pressure cannot be lowered with healthy choices.  Your blood pressure is higher than 130/80.  Follow these instructions at home: Eating  and drinking  If directed, follow the DASH eating plan. This diet includes: ? Filling half of your plate at each meal with fruits and vegetables. ? Filling one quarter of your plate at each meal with whole grains. Whole grains include whole wheat pasta, brown rice, and whole grain bread. ? Eating or drinking low-fat dairy products, such as skim milk or low-fat yogurt. ? Filling one quarter of your plate at each meal with low-fat (lean) proteins. Low-fat proteins include fish, skinless chicken, eggs, beans, and tofu. ? Avoiding fatty meat, cured and processed meat, or chicken with skin. ? Avoiding premade or processed food.  Eat less than 1,500 mg of salt (sodium) a day.  Limit alcohol use to no more than 1 drink a day for nonpregnant women and 2 drinks a day for men. One drink equals 12 oz of beer, 5 oz of wine, or 1 oz of hard liquor. Lifestyle  Work with your doctor to stay at a healthy weight or to lose weight. Ask your doctor what the best weight is for you.  Get at least 30 minutes of exercise that causes your heart to beat faster (aerobic exercise) most days of the week. This may include walking, swimming, or biking.  Get at least 30 minutes of exercise that strengthens your muscles (resistance exercise) at least 3 days a  week. This may include lifting weights or pilates.  Do not use any products that contain nicotine or tobacco. This includes cigarettes and e-cigarettes. If you need help quitting, ask your doctor.  Check your blood pressure at home as told by your doctor.  Keep all follow-up visits as told by your doctor. This is important. Medicines  Take over-the-counter and prescription medicines only as told by your doctor. Follow directions carefully.  Do not skip doses of blood pressure medicine. The medicine does not work as well if you skip doses. Skipping doses also puts you at risk for problems.  Ask your doctor about side effects or reactions to medicines that you should watch for. Contact a doctor if:  You think you are having a reaction to the medicine you are taking.  You have headaches that keep coming back (recurring).  You feel dizzy.  You have swelling in your ankles.  You have trouble with your vision. Get help right away if:  You get a very bad headache.  You start to feel confused.  You feel weak or numb.  You feel faint.  You get very bad pain in your: ? Chest. ? Belly (abdomen).  You throw up (vomit) more than once.  You have trouble breathing. Summary  Hypertension is another name for high blood pressure.  Making healthy choices can help lower blood pressure. If your blood pressure cannot be controlled with healthy choices, you may need to take medicine. This information is not intended to replace advice given to you by your health care provider. Make sure you discuss any questions you have with your health care provider. Document Released: 04/04/2008 Document Revised: 09/14/2016 Document Reviewed: 09/14/2016 Elsevier Interactive Patient Education  2018 Elsevier Inc.      Agustina Caroli, MD [facility]

## 2017-09-19 ENCOUNTER — Telehealth: Payer: Self-pay

## 2017-09-19 NOTE — Telephone Encounter (Signed)
Received request from [facility] for PA on Amlodipine, however, when I called the rep told me his account is inactive.  Pharmacy aware.

## 2017-11-14 ENCOUNTER — Ambulatory Visit: Payer: BC Managed Care – PPO | Admitting: Urgent Care

## 2017-11-15 ENCOUNTER — Other Ambulatory Visit: Payer: Self-pay

## 2017-11-15 ENCOUNTER — Encounter: Payer: Self-pay | Admitting: Physician Assistant

## 2017-11-15 ENCOUNTER — Ambulatory Visit: Payer: BLUE CROSS/BLUE SHIELD | Admitting: Physician Assistant

## 2017-11-15 VITALS — BP 190/120 | HR 71 | Temp 98.2°F | Resp 16 | Ht 69.0 in | Wt 181.4 lb

## 2017-11-15 DIAGNOSIS — Z13220 Encounter for screening for lipoid disorders: Secondary | ICD-10-CM | POA: Diagnosis not present

## 2017-11-15 DIAGNOSIS — I10 Essential (primary) hypertension: Secondary | ICD-10-CM | POA: Diagnosis not present

## 2017-11-15 DIAGNOSIS — Z202 Contact with and (suspected) exposure to infections with a predominantly sexual mode of transmission: Secondary | ICD-10-CM

## 2017-11-15 DIAGNOSIS — R030 Elevated blood-pressure reading, without diagnosis of hypertension: Secondary | ICD-10-CM | POA: Diagnosis not present

## 2017-11-15 DIAGNOSIS — M79604 Pain in right leg: Secondary | ICD-10-CM

## 2017-11-15 MED ORDER — HYDROCHLOROTHIAZIDE 12.5 MG PO CAPS: 12.5000 mg | ORAL_CAPSULE | Freq: Every day | ORAL | 3 refills | Status: DC

## 2017-11-15 MED ORDER — AMLODIPINE BESYLATE 10 MG PO TABS: 10.0000 mg | ORAL_TABLET | Freq: Every day | ORAL | 3 refills | Status: DC

## 2017-11-15 MED ORDER — MELOXICAM 7.5 MG PO TABS
7.5000 mg | ORAL_TABLET | Freq: Two times a day (BID) | ORAL | 0 refills | Status: DC
Start: 2017-11-15 — End: 2017-12-09

## 2017-11-15 MED ORDER — CYCLOBENZAPRINE HCL 10 MG PO TABS: 10.0000 mg | ORAL_TABLET | Freq: Three times a day (TID) | ORAL | 0 refills | Status: DC | PRN

## 2017-11-15 NOTE — Progress Notes (Signed)
Ruben Camacho.  MRN: [id number] DOB: [birth year]  PCP: Patient, No Pcp Per  Subjective:  Pt is a 62 year old male who presents to clinic for right leg pain x 2 weeks.  HTN - today's blood pressure is 190/120. Denies cardiac symptoms. H/o uncontrolled HTN, extensive h/o not controlling medication. Has he been on metoprolol, HCTZ, amlodipine, lisinopril.  History of stroke in September 2012 due to uncontrolled HTN.  He had some speech difficulties and left-sided deficits afterwards which were treated in therapy "I know when it's high because I see waves in front of my eyes" OV note from 02/2016 - Changed from Lisinopril to HCTZ out of concern that is was contributing to gout. Lost insurance. Was not taking medications. No longer taking statin.   Right leg pain. From groin, down front of leg and both sides of upper knee. Sometimes starts mid thigh.  No MOI. 7/10 pain. Stretching and massage make it feel better. He has tried motrin, which helped some. Has not been stretching. 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   He would like screening for STDs today. Denies symptoms.   Review of Systems  Constitutional: Negative for chills and diaphoresis.  Respiratory: Negative for cough, chest tightness, shortness of breath and wheezing.   Cardiovascular: Negative for chest pain, palpitations and leg swelling.  Gastrointestinal: Negative for diarrhea, nausea and vomiting.  Musculoskeletal: Positive for myalgias. Negative for arthralgias and neck pain.  Neurological: Negative for dizziness, syncope, weakness, light-headedness and headaches.    Patient Active Problem List   Diagnosis Date Noted  . Sprain of left ankle 11/09/2016  . Intractable migraine without aura and without status migrainosus 01/08/2015  . Hypertension, uncontrolled 11/30/2011  . Hyperlipemia 11/30/2011  . Gout 11/30/2011  . Beta thalassemia ([place]) 11/30/2011    Current Outpatient Medications on  File Prior to Visit  Medication Sig Dispense Refill  . allopurinol (ZYLOPRIM) 300 MG tablet Take 1 tablet (300 mg total) by mouth daily. Do not start during gout flair 30 tablet 1  . amLODipine (NORVASC) 5 MG tablet Take 1 tablet (5 mg total) by mouth daily. 90 tablet 3  . aspirin 81 MG tablet Take 81 mg by mouth daily.    . colchicine 0.6 MG tablet 1 tab bid til gout resolves then qd for 7 more days (Patient not taking: Reported on 05/02/2017) 20 tablet 2  . diclofenac (VOLTAREN) 75 MG EC tablet TAKE 1 TABLET(75 MG) BY MOUTH TWICE DAILY (Patient not taking: Reported on 05/02/2017) 30 tablet 0   No current facility-administered medications on file prior to visit.     No Known Allergies   Objective:  BP (!) 227/130   Pulse 71   Temp 98.2 F (36.8 C) (Oral)   Resp 16   Ht '5\' 9"'  (1.753 m)   Wt 181 lb 6.4 oz (82.3 kg)   SpO2 97%   BMI 26.79 kg/m   Physical Exam  Constitutional: He is oriented to person, place, and time and well-developed, well-nourished, and in no distress. No distress.  Cardiovascular: Normal rate, regular rhythm and normal heart sounds.  Musculoskeletal:       Right hip: He exhibits normal range of motion, normal strength and no tenderness.       Left hip: He exhibits normal range of motion, normal strength and no tenderness.       Right upper leg: He exhibits no tenderness, no swelling and no edema.  Left upper leg: He exhibits no tenderness, no swelling and no edema.  Left thigh muscles feels tighter than right.   Neurological: He is alert and oriented to person, place, and time. He has normal sensation and normal strength. GCS score is 15.  Skin: Skin is warm and dry.  Psychiatric: Mood, memory, affect and judgment normal.  Vitals reviewed.   Assessment and Plan :  1. Elevated blood pressure reading 2. Essential hypertension - CMP14+EGFR - amLODipine (NORVASC) 10 MG tablet; Take 1 tablet (10 mg total) by mouth daily.  Dispense: 90 tablet; Refill: 3 -  Hydrochlorothiazide (MICROZIDE) 12.83m capsule; Take 1 capsule by mouth daily.  - Pt presents for leg pain with a blood pressure of 190/120. Has been off medications for a while due to no insurance. He is asymptomatic. Extensive h/o uncontrolled HTN and controlling medication. H/o stroke 2/2 uncontrolled blood pressure. Plan to start HCTZ and Norvasc. RTC in 2-3 weeks for recheck. Advised DASH diet.  3. Right leg pain - cyclobenzaprine (FLEXERIL) 10 MG tablet; Take 1 tablet (10 mg total) by mouth 3 (three) times daily as needed for muscle spasms.  Dispense: 30 tablet; Refill: 0 - meloxicam (MOBIC) 7.5 MG tablet; Take 1 tablet (7.5 mg total) by mouth 2 (two) times daily.  Dispense: 60 tablet; Refill: 0 - Advised heat, stretching, massage. RTC if no improvement. Consider imaging.  4. Screening, lipid - Lipid panel  5. Possible exposure to STD - Chlamydia/Gonococcus/Trichomonas, NAA - Hepatitis C antibody - HIV antibody - RPR - labs are pending. Will contact with results.   WMercer Pod PA-C  [facility]/16/2019 5:42 PM

## 2017-11-15 NOTE — Patient Instructions (Addendum)
Your blood pressure is too high. Considering your history of stroke, it is very important to get this pressure down.  Come back and see me in 2 weeks to recheck your blood pressure and recheck your left leg.   Flexeril is a muscle relaxer. This may make you drowsy. If this is the case, take this at night.  Meloxicam is an NSAID. Do not use with any other otc pain medication other than tylenol/acetaminophen - so no aleve, ibuprofen, motrin, advil, etc. You may take both of these medications as the same time.   Google "Runner's stretches". Do these 1-2 times daily.  Stay well hydrated.  Apply heat and/or ice as needed for pain Massage your leg.   Thank you for coming in today. I hope you feel we met your needs.  Feel free to call PCP if you have any questions or further requests.  Please consider signing up for MyChart if you do not already have it, as this is a great way to communicate with me.  Best,  Whitney McVey, PA-C   IF you received an x-ray today, you will receive an invoice from [facility]. Please contact [facility] at [phone] with questions or concerns regarding your invoice.   IF you received labwork today, you will receive an invoice from [facility]. Please contact [facility] at [phone] with questions or concerns regarding your invoice.   Our billing staff will not be able to assist you with questions regarding bills from these companies.  You will be contacted with the lab results as soon as they are available. The fastest way to get your results is to activate your My Chart account. Instructions are located on the last page of this paperwork. If you have not heard from [place] regarding the results in 2 weeks, please contact this office.

## 2017-11-16 LAB — CHLAMYDIA/GONOCOCCUS/TRICHOMONAS, NAA
Chlamydia by NAA: NEGATIVE
Gonococcus by NAA: NEGATIVE
Trich vag by NAA: NEGATIVE

## 2017-11-18 NOTE — Progress Notes (Signed)
Please call pt and let him know he is negative for gonorrhea, chlamydia, trichomonas, hepatitis C, HIV and syphilis.  His total cholesterol and LDL cholesterol is high.  I would recommend that the patient eat healthier, cut down on fatty foods, fried foods, limit red meat to once a week, eat balanced meals including a serving of vegetables and/or fruits with every meal. Patient should also start exercising at least 2-3 times per week to help with cholesterol. Work on the [employer] to lower blood pressure. Come back and see me in 10 days for blood pressure check.  Thank you!

## 2017-11-20 LAB — RPR: RPR Ser Ql: NONREACTIVE

## 2017-11-20 LAB — CMP14+EGFR
ALT: 21 IU/L (ref 0–44)
AST: 24 IU/L (ref 0–40)
Albumin/Globulin Ratio: 1.8 (ref 1.2–2.2)
Albumin: 5.2 g/dL — ABNORMAL HIGH (ref 3.6–4.8)
Alkaline Phosphatase: 83 IU/L (ref 39–117)
BUN/Creatinine Ratio: 16 (ref 10–24)
BUN: 24 mg/dL (ref 8–27)
Bilirubin Total: 0.6 mg/dL (ref 0.0–1.2)
CO2: 23 mmol/L (ref 20–29)
Calcium: 9.9 mg/dL (ref 8.6–10.2)
Chloride: 103 mmol/L (ref 96–106)
Creatinine, Ser: 1.47 mg/dL — ABNORMAL HIGH (ref 0.76–1.27)
GFR calc Af Amer: 59 mL/min/{1.73_m2} — ABNORMAL LOW (ref >59)
GFR calc non Af Amer: 51 mL/min/{1.73_m2} — ABNORMAL LOW (ref >59)
Globulin, Total: 2.9 g/dL (ref 1.5–4.5)
Glucose: 97 mg/dL (ref 65–99)
Potassium: 3.7 mmol/L (ref 3.5–5.2)
Sodium: 147 mmol/L — ABNORMAL HIGH (ref 134–144)
Total Protein: 8.1 g/dL (ref 6.0–8.5)

## 2017-11-20 LAB — LIPID PANEL
Chol/HDL Ratio: 3.6 ratio (ref 0.0–5.0)
Cholesterol, Total: 220 mg/dL — ABNORMAL HIGH (ref 100–199)
HDL: 61 mg/dL (ref >39)
LDL Calculated: 136 mg/dL — ABNORMAL HIGH (ref 0–99)
Triglycerides: 114 mg/dL (ref 0–149)
VLDL Cholesterol Cal: 23 mg/dL (ref 5–40)

## 2017-11-20 LAB — HIV ANTIBODY (ROUTINE TESTING W REFLEX): HIV Screen 4th Generation wRfx: NONREACTIVE

## 2017-11-20 LAB — HEPATITIS C ANTIBODY: Hep C Virus Ab: <0.1 {s_co_ratio} (ref 0.0–0.9)

## 2017-12-09 ENCOUNTER — Ambulatory Visit: Payer: BLUE CROSS/BLUE SHIELD | Admitting: Physician Assistant

## 2017-12-09 ENCOUNTER — Ambulatory Visit (INDEPENDENT_AMBULATORY_CARE_PROVIDER_SITE_OTHER): Payer: BLUE CROSS/BLUE SHIELD

## 2017-12-09 ENCOUNTER — Encounter: Payer: Self-pay | Admitting: Physician Assistant

## 2017-12-09 ENCOUNTER — Other Ambulatory Visit: Payer: Self-pay

## 2017-12-09 VITALS — BP 156/86 | HR 86 | Temp 99.0°F | Resp 16 | Ht 69.5 in | Wt 181.8 lb

## 2017-12-09 DIAGNOSIS — I10 Essential (primary) hypertension: Secondary | ICD-10-CM | POA: Diagnosis not present

## 2017-12-09 DIAGNOSIS — M25561 Pain in right knee: Secondary | ICD-10-CM

## 2017-12-09 DIAGNOSIS — R030 Elevated blood-pressure reading, without diagnosis of hypertension: Secondary | ICD-10-CM | POA: Diagnosis not present

## 2017-12-09 MED ORDER — HYDROCHLOROTHIAZIDE 50 MG PO TABS: 50.0000 mg | ORAL_TABLET | Freq: Every day | ORAL | 1 refills | Status: DC

## 2017-12-09 MED ORDER — MELOXICAM 15 MG PO TABS: 15.0000 mg | ORAL_TABLET | Freq: Every day | ORAL | 0 refills | Status: DC

## 2017-12-09 NOTE — Progress Notes (Signed)
Ruben Camacho.  MRN: [id number] DOB: January 19, 1956  PCP: Ruben Pollen, MD  Subjective:  Pt is a 62 year old male PMH HTN, migraine, gout, HLD, beta thalassemia who presents to clinic for f/u blood pressure.  He was here one month ago with a BP of 190/120. He has extensive h/o not controlling medication. Has he been on metoprolol, HCTZ, amlodipine, lisinopril. History of stroke in September 2012 due to uncontrolled HTN.  Today's his blood pressure is 170/70, recheck is 156/86. He is taking Norvasc 10mg  and HCTZ 12.5mg . Denies chest pain, HA, palpitations.   Right knee pain. con't to be a problem for him x 2 months. Pain is located above left knee to mid thigh and on both sides. Pain is 6/10. Sometimes radiates from groin. Hurts sometimes when he is laying in bed. Hurts getting up from seated position. Feels a little unstable sometimes, but not often.  He has been stretching. "relief was temporary". Flexeril is not helping. He never picked up Mobic after last OV. Denies muscle weakness  Review of Systems  Constitutional: Negative for diaphoresis and fatigue.  Respiratory: Negative for cough, chest tightness, shortness of breath and wheezing.   Cardiovascular: Negative for chest pain, palpitations and leg swelling.  Musculoskeletal: Positive for arthralgias. Negative for gait problem and joint swelling.  Neurological: Negative for dizziness and headaches.    Patient Active Problem List   Diagnosis Date Noted  . Sprain of left ankle 11/09/2016  . Intractable migraine without aura and without status migrainosus 01/08/2015  . Hypertension, uncontrolled 11/30/2011  . Hyperlipemia 11/30/2011  . Gout 11/30/2011  . Beta thalassemia ([place]) 11/30/2011    Current Outpatient Medications on File Prior to Visit  Medication Sig Dispense Refill  . amLODipine (NORVASC) 10 MG tablet Take 1 tablet (10 mg total) by mouth daily. 90 tablet 3  . aspirin 81 MG tablet Take 81 mg by mouth daily.      . diclofenac (VOLTAREN) 75 MG EC tablet TAKE 1 TABLET(75 MG) BY MOUTH TWICE DAILY 30 tablet 0  . hydrochlorothiazide (MICROZIDE) 12.5 MG capsule Take 1 capsule (12.5 mg total) by mouth daily. 90 capsule 3  . meloxicam (MOBIC) 7.5 MG tablet Take 1 tablet (7.5 mg total) by mouth 2 (two) times daily. 60 tablet 0  . allopurinol (ZYLOPRIM) 300 MG tablet Take 1 tablet (300 mg total) by mouth daily. Do not start during gout flair (Patient not taking: Reported on 12/09/2017) 30 tablet 1  . colchicine 0.6 MG tablet 1 tab bid til gout resolves then qd for 7 more days (Patient not taking: Reported on 05/02/2017) 20 tablet 2  . cyclobenzaprine (FLEXERIL) 10 MG tablet Take 1 tablet (10 mg total) by mouth 3 (three) times daily as needed for muscle spasms. (Patient not taking: Reported on 12/09/2017) 30 tablet 0   No current facility-administered medications on file prior to visit.     Allergies  Allergen Reactions  . Lisinopril     gout     Objective:  BP (!) 170/70   Pulse 86   Temp 99 F (37.2 C) (Oral)   Resp 16   Ht 5' 9.5" (1.765 m)   Wt 181 lb 12.8 oz (82.5 kg)   SpO2 96%   BMI 26.46 kg/m   Physical Exam  Constitutional: He is oriented to person, place, and time and well-developed, well-nourished, and in no distress. No distress.  Cardiovascular: Normal rate, regular rhythm and normal heart sounds.  Musculoskeletal:  Right knee: He exhibits normal range of motion, no swelling, no effusion, no deformity, no erythema, normal alignment, no LCL laxity, normal patellar mobility, no bony tenderness, normal meniscus and no MCL laxity. No tenderness found.  Neurological: He is alert and oriented to person, place, and time. GCS score is 15.  Skin: Skin is warm and dry.  Psychiatric: Mood, memory, affect and judgment normal.  Vitals reviewed.   Dg Knee Complete 4 Views Right  Result Date: 12/09/2017 CLINICAL DATA:  Acute right knee pain for 2 months. EXAM: RIGHT KNEE - COMPLETE 4+ VIEW  COMPARISON:  None. FINDINGS: No evidence of fracture, dislocation, or joint effusion. No evidence of arthropathy or other focal bone abnormality. Soft tissues are unremarkable. IMPRESSION: Negative. Electronically Signed   By: Margarette Canada M.D.   On: 12/09/2017 08:37    Assessment and Plan :  1. Essential hypertension 2. Elevated blood pressure reading - hydrochlorothiazide (HYDRODIURIL) 50 MG tablet; Take 1 tablet (50 mg total) by mouth daily.  Dispense: 90 tablet; Refill: 1 - Blood pressure still not controlled - today's blood pressure reading is 156/86. Plan to increase HCTZ to 50mg  qd, con't Norvasc 10mg . RTC in 3-4 weeks for recheck.  3. Right knee pain, unspecified chronicity -  DG Knee Complete 4 Views Right; Future - meloxicam (MOBIC) 15 MG tablet; Take 1 tablet (15 mg total) by mouth daily.  Dispense: 30 tablet; Refill: 0 - Pt c/o leg pain x 2 months. Flexeril is not helping. Stretching helps some. Knee exam is negative.  X-ray is negative. Suspect muscle strain. Will try Mobic, as he did not pick this up after last appt. Plan to recheck at f/u appt. Consider sports medicine referral.     Mercer Pod, PA-C  [facility] 12/09/2017 8:10 AM

## 2017-12-09 NOTE — Patient Instructions (Addendum)
  Start taking hydrochlorothiazide (microzide) '50mg'$  (that's 4 pills of the 12.'5mg'$ ). I sent in a new prescription for the '50mg'$  dose - so you can exchange for new dose.  Continue taking Norvasc '10mg'$  daily.   Start taking Meloxicam (Mobic) for your knee pain. This is a once daily medication. Do not use with any other otc pain medication other than tylenol/acetaminophen - so no aleve, ibuprofen, motrin, advil, etc. We will send you for an ultrasound if you are still not better when you come back.   Come back and see me in 4-6 weeks for blood pressure recheck.   Thank you for coming in today. I hope you feel we met your needs.  Feel free to call PCP if you have any questions or further requests.  Please consider signing up for MyChart if you do not already have it, as this is a great way to communicate with me.  Best,  Whitney McVey, PA-C  IF you received an x-ray today, you will receive an invoice from [facility]. Please contact [facility]iology at [phone] with questions or concerns regarding your invoice.   IF you received labwork today, you will receive an invoice from [facility]. Please contact [facility] at [phone] with questions or concerns regarding your invoice.   Our billing staff will not be able to assist you with questions regarding bills from these companies.  You will be contacted with the lab results as soon as they are available. The fastest way to get your results is to activate your My Chart account. Instructions are located on the last page of this paperwork. If you have not heard from [place] regarding the results in 2 weeks, please contact this office.

## 2017-12-28 ENCOUNTER — Other Ambulatory Visit: Payer: Self-pay | Admitting: Physician Assistant

## 2017-12-28 DIAGNOSIS — M25561 Pain in right knee: Secondary | ICD-10-CM

## 2017-12-28 DIAGNOSIS — M79604 Pain in right leg: Secondary | ICD-10-CM

## 2018-01-19 ENCOUNTER — Encounter: Payer: Self-pay | Admitting: Physician Assistant

## 2018-01-19 ENCOUNTER — Other Ambulatory Visit: Payer: Self-pay

## 2018-01-19 ENCOUNTER — Ambulatory Visit: Payer: BLUE CROSS/BLUE SHIELD | Admitting: Physician Assistant

## 2018-01-19 VITALS — BP 180/110 | HR 99 | Temp 98.8°F | Resp 16 | Ht 69.0 in | Wt 180.2 lb

## 2018-01-19 DIAGNOSIS — F411 Generalized anxiety disorder: Secondary | ICD-10-CM | POA: Diagnosis not present

## 2018-01-19 DIAGNOSIS — I10 Essential (primary) hypertension: Secondary | ICD-10-CM

## 2018-01-19 DIAGNOSIS — G4700 Insomnia, unspecified: Secondary | ICD-10-CM | POA: Diagnosis not present

## 2018-01-19 MED ORDER — TRAZODONE HCL 50 MG PO TABS: 50.0000 mg | ORAL_TABLET | Freq: Every evening | ORAL | 3 refills | Status: DC | PRN

## 2018-01-19 MED ORDER — CHLORTHALIDONE 50 MG PO TABS: 50.0000 mg | ORAL_TABLET | Freq: Every day | ORAL | 2 refills | Status: DC

## 2018-01-19 MED ORDER — HYDROXYZINE PAMOATE 50 MG PO CAPS: 50.0000 mg | ORAL_CAPSULE | Freq: Four times a day (QID) | ORAL | 1 refills | Status: DC | PRN

## 2018-01-19 NOTE — Patient Instructions (Addendum)
  STOP taking HCTZ and norvasc.  START taking chlorthalidone '50mg'$  daily.  Come back and see me in 2-3 weeks for blood pressure check.    trazodone is to help you sleep. Take this as directed.  Hydroxyzine is for anxiety. Take 50-100 mg four times daily.   Thank you for coming in today. I hope you feel we met your needs.  Feel free to call PCP if you have any questions or further requests.  Please consider signing up for MyChart if you do not already have it, as this is a great way to communicate with me.  Best,  Whitney McVey, PA-C  IF you received an x-ray today, you will receive an invoice from [facility]. Please contact [facility] at [phone] with questions or concerns regarding your invoice.   IF you received labwork today, you will receive an invoice from [place]. Please contact [facility] at [phone] with questions or concerns regarding your invoice.   Our billing staff will not be able to assist you with questions regarding bills from these companies.  You will be contacted with the lab results as soon as they are available. The fastest way to get your results is to activate your My Chart account. Instructions are located on the last page of this paperwork. If you have not heard from [place] regarding the results in 2 weeks, please contact this office.

## 2018-01-19 NOTE — Progress Notes (Signed)
Ruben Camacho.  MRN: [id number] DOB: 1956/04/26  PCP: Horald Pollen, MD  Subjective:  Pt is a 62 year old male PMH HTN, HLD, gout, beta thalassemia who presents to clinic for HTN f/u. He has extensive h/o medication noncompliance. Has he been on metoprolol, HCTZ, amlodipine, lisinopril. History of stroke in September 2012 due to uncontrolled HTN.  HTN - HCTZ 50mg  and Norvasc 10mg . Last OV 12/09/2017. Blood pressure at that time was 156/86.  Today's blood pressure is 207/122. Recheck is 180/110. He is asymptomatic. Denies chest pain, vision changes, HA, palpitations.  Review of Systems  Constitutional: Negative for chills and diaphoresis.  Respiratory: Negative for cough, chest tightness, shortness of breath and wheezing.   Cardiovascular: Negative for chest pain, palpitations and leg swelling.  Gastrointestinal: Negative for diarrhea, nausea and vomiting.  Musculoskeletal: Negative for neck pain.  Neurological: Negative for dizziness, syncope, light-headedness and headaches.  Psychiatric/Behavioral: Negative for sleep disturbance. The patient is not nervous/anxious.     Patient Active Problem List   Diagnosis Date Noted  . Sprain of left ankle 11/09/2016  . Intractable migraine without aura and without status migrainosus 01/08/2015  . Hypertension, uncontrolled 11/30/2011  . Hyperlipemia 11/30/2011  . Gout 11/30/2011  . Beta thalassemia ([place]) 11/30/2011    Current Outpatient Medications on File Prior to Visit  Medication Sig Dispense Refill  . amLODipine (NORVASC) 10 MG tablet Take 1 tablet (10 mg total) by mouth daily. 90 tablet 3  . aspirin 81 MG tablet Take 81 mg by mouth daily.    . hydrochlorothiazide (HYDRODIURIL) 50 MG tablet Take 1 tablet (50 mg total) by mouth daily. 90 tablet 1  . allopurinol (ZYLOPRIM) 300 MG tablet Take 1 tablet (300 mg total) by mouth daily. Do not start during gout flair (Patient not taking: Reported on 01/19/2018) 30 tablet 1  .  colchicine 0.6 MG tablet 1 tab bid til gout resolves then qd for 7 more days (Patient not taking: Reported on 05/02/2017) 20 tablet 2  . cyclobenzaprine (FLEXERIL) 10 MG tablet Take 1 tablet (10 mg total) by mouth 3 (three) times daily as needed for muscle spasms. (Patient not taking: Reported on 12/09/2017) 30 tablet 0  . diclofenac (VOLTAREN) 75 MG EC tablet TAKE 1 TABLET(75 MG) BY MOUTH TWICE DAILY (Patient not taking: Reported on 01/19/2018) 30 tablet 0  . meloxicam (MOBIC) 15 MG tablet TAKE 1 TABLET(15 MG) BY MOUTH DAILY (Patient not taking: Reported on 01/19/2018) 30 tablet 0   No current facility-administered medications on file prior to visit.     Allergies  Allergen Reactions  . Lisinopril     gout     Objective:  BP (!) 207/122   Pulse 99   Temp 98.8 F (37.1 C) (Oral)   Resp 16   Ht 5\' 9"  (1.753 m)   Wt 180 lb 3.2 oz (81.7 kg)   SpO2 97%   BMI 26.61 kg/m   Physical Exam  Assessment and Plan :  1. Essential hypertension - chlorthalidone (HYGROTON) 50 MG tablet; Take 1 tablet (50 mg total) by mouth daily.  Dispense: 30 tablet; Refill: 2 - pt presents for f/u HTN. Today's blood pressure is 207/122. Recheck is 180/110. He is asymptomatic. D/c HCTZ 50mg  and Norvasc 10mg . Start chlorthalidone 50mg  qd. RTC in 2 weeks for recheck.  2. Anxiety state - hydrOXYzine (VISTARIL) 50 MG capsule; Take 1-2 capsules (50-100 mg total) by mouth 4 (four) times daily as needed for anxiety.  Dispense: 60 capsule; Refill:  1  3. Insomnia, unspecified type - traZODone (DESYREL) 50 MG tablet; Take 1-2 tablets (50-100 mg total) by mouth at bedtime as needed for sleep.  Dispense: 30 tablet; Refill: 3   Whitney Wilhemina Grall, PA-C  [facility] [place] 01/19/2018 4:59 PM

## 2018-02-10 ENCOUNTER — Ambulatory Visit: Payer: BLUE CROSS/BLUE SHIELD | Admitting: Physician Assistant

## 2018-03-16 ENCOUNTER — Other Ambulatory Visit: Payer: Self-pay | Admitting: Physician Assistant

## 2018-03-16 DIAGNOSIS — M25561 Pain in right knee: Secondary | ICD-10-CM

## 2018-08-30 ENCOUNTER — Telehealth: Payer: Self-pay | Admitting: Physician Assistant

## 2018-08-30 DIAGNOSIS — M79604 Pain in right leg: Secondary | ICD-10-CM

## 2018-08-30 DIAGNOSIS — M25561 Pain in right knee: Secondary | ICD-10-CM

## 2018-08-31 NOTE — Telephone Encounter (Signed)
Pt stated that the pharmacy needs a new prescription in order to fill medication cyclobenzaprine (FLEXERIL) 10 MG tablet   Please Advise.   [facility] [place], [place] [address] AT [facility] OF [place] [phone] (Phone) [phone] (Fax)

## 2018-09-04 ENCOUNTER — Encounter: Payer: Self-pay | Admitting: Family Medicine

## 2018-09-04 ENCOUNTER — Ambulatory Visit: Payer: 59 | Admitting: Family Medicine

## 2018-09-04 ENCOUNTER — Other Ambulatory Visit: Payer: Self-pay

## 2018-09-04 VITALS — BP 182/100 | HR 77 | Temp 98.2°F | Resp 18 | Ht 69.0 in | Wt 180.6 lb

## 2018-09-04 DIAGNOSIS — M549 Dorsalgia, unspecified: Secondary | ICD-10-CM

## 2018-09-04 DIAGNOSIS — I10 Essential (primary) hypertension: Secondary | ICD-10-CM | POA: Diagnosis not present

## 2018-09-04 LAB — POCT URINALYSIS DIP (MANUAL ENTRY)
Bilirubin, UA: NEGATIVE
Blood, UA: NEGATIVE
Glucose, UA: NEGATIVE mg/dL
Ketones, POC UA: NEGATIVE mg/dL
Leukocytes, UA: NEGATIVE
Nitrite, UA: NEGATIVE
Protein Ur, POC: >=300 mg/dL — AB
Spec Grav, UA: 1.015 (ref 1.010–1.025)
Urobilinogen, UA: 0.2 E.U./dL
pH, UA: 7 (ref 5.0–8.0)

## 2018-09-04 MED ORDER — METOPROLOL SUCCINATE ER 25 MG PO TB24: 25.0000 mg | ORAL_TABLET | Freq: Every day | ORAL | 0 refills | Status: DC

## 2018-09-04 MED ORDER — CYCLOBENZAPRINE HCL 10 MG PO TABS: 10.0000 mg | ORAL_TABLET | Freq: Three times a day (TID) | ORAL | 0 refills | Status: DC | PRN

## 2018-09-04 NOTE — Progress Notes (Signed)
11/5/201911:11 AM  Ruben Camacho. 07-Dec-1955, 62 y.o. male [id number]  Chief Complaint  Patient presents with  . Back Pain    lower back pain feels like a spasm x1week but getting worse     HPI:   Patient is a 62 y.o. male with past medical history significant for HTN who presents today for back pain  For about this past week has noticed low back stiffness and for past 2 days started to hurt Has happened in the past before No numbness, tingling down his legs Denies any changes to bowel or bladder function Denies any focal weakness Denies any fever or chills Took ibu 400mg  did not help Denies any inciting event  Reports took BP meds this morning Feels BP elevated from pain Reports he is only taking amlodipine 10mg  Last OV march 2019, added chlorthalidone Has h/o gout, last gout attack a year ago H/o CVA  Lab Results  Component Value Date   CREATININE 1.47 (H) 11/15/2017    Fall Risk  09/04/2018 01/19/2018 12/09/2017 11/15/2017 05/02/2017  Falls in the past year? 0 No No No No  Number falls in past yr: - - - - -  Injury with Fall? - - - - -     Depression screen [facility] 2/9 09/04/2018 01/19/2018 12/09/2017  Decreased Interest 0 0 0  Down, Depressed, Hopeless 0 0 0  PHQ - 2 Score 0 0 0    No Known Allergies  Prior to Admission medications   Medication Sig Start Date End Date Taking? Authorizing Provider  amLODipine (NORVASC) 10 MG tablet Take 1 tablet (10 mg total) by mouth daily. 11/15/17  Yes McVey, Gelene Mink, PA-C  aspirin 81 MG tablet Take 81 mg by mouth daily.   Yes [provider]    Past Medical History:  Diagnosis Date  . Gout    1-2 exacerbations per year.  Takes Advil PRN for pain.  [facility] Hyperlipidemia   . Hypertension   . Migraine   . Stroke ([place]) 07/01/2012   L sided weakness, slurred speech.  Admission [facility].      Past Surgical History:  Procedure Laterality Date  . Admission  07/01/2012   CVA (L sided weakness, slurred  speech).  [facility].  . COLONOSCOPY  10/31/2008   no polyps.  Repeat in 10 years.  [place].  Marland Kitchen HERNIA REPAIR      Social History   Tobacco Use  . Smoking status: Never Smoker  . Smokeless tobacco: Never Used  Substance Use Topics  . Alcohol use: No    Alcohol/week: 0.0 standard drinks    Comment: former drinker    Family History  Problem Relation Age of Onset  . Stroke Mother   . Hypertension Mother   . Hypertension Daughter   . Cancer Maternal Grandmother        lung  . Healthy Father        Died in motorcycle accident    Review of Systems  Constitutional: Negative for chills and fever.  Respiratory: Negative for cough and shortness of breath.   Cardiovascular: Negative for chest pain, palpitations and leg swelling.  Gastrointestinal: Negative for abdominal pain, nausea and vomiting.  Musculoskeletal: Positive for back pain.  Neurological: Negative for sensory change, speech change and focal weakness.     OBJECTIVE:  Blood pressure (!) 223/109, pulse 77, temperature 98.2 F (36.8 C), temperature source Oral, resp. rate 18, height 5\' 9"  (1.753 m), weight 180 lb 9.6 oz (81.9 kg), SpO2  97 %. Body mass index is 26.67 kg/m.   Repeat BP 182/100  BP Readings from Last 3 Encounters:  09/04/18 (!) 223/109  01/19/18 (!) 180/110  12/09/17 (!) 156/86    Physical Exam  Constitutional: He is oriented to person, place, and time. He appears well-developed and well-nourished.  HENT:  Head: Normocephalic and atraumatic.  Mouth/Throat: Oropharynx is clear and moist.  Eyes: Pupils are equal, round, and reactive to light. Conjunctivae and EOM are normal.  Neck: Neck supple.  Cardiovascular: Normal rate and regular rhythm. Exam reveals no gallop and no friction rub.  No murmur heard. Pulmonary/Chest: Effort normal and breath sounds normal. He has no wheezes. He has no rales.  Abdominal: Soft. Bowel sounds are normal. He exhibits no distension. There is no tenderness.    Musculoskeletal: He exhibits no edema.       Lumbar back: He exhibits decreased range of motion, tenderness and spasm. He exhibits no bony tenderness.       Back:  Neurological: He is alert and oriented to person, place, and time. He has normal strength. He displays normal reflexes. Gait normal.  Neg BLE SLR  Skin: Skin is warm and dry.  Psychiatric: He has a normal mood and affect.  Nursing note and vitals reviewed.   Results for orders placed or performed in visit on 09/04/18 (from the past 24 hour(s))  POCT urinalysis dipstick     Status: Abnormal   Collection Time: 09/04/18 10:30 AM  Result Value Ref Range   Color, UA yellow yellow   Clarity, UA clear clear   Glucose, UA negative negative mg/dL   Bilirubin, UA negative negative   Ketones, POC UA negative negative mg/dL   Spec Grav, UA 1.015 1.010 - 1.025   Blood, UA negative negative   pH, UA 7.0 5.0 - 8.0   Protein Ur, POC >=300 (A) negative mg/dL   Urobilinogen, UA 0.2 0.2 or 1.0 E.U./dL   Nitrite, UA Negative Negative   Leukocytes, UA Negative Negative      ASSESSMENT and PLAN  1. Back pain, unspecified back location, unspecified back pain laterality, unspecified chronicity No red flags, discussed supportive measures. New med r/se/b reviewed. No nsaids given uncontrolled htn and concerns for CKD, tylenol ok. - POCT urinalysis dipstick  2. Essential hypertension Asymptomatic. Discussed importance of good BP control and risks on long standing uncontrolled HTN. Patient already with h/o CVA and concerns for CKD. For now adding low dose BB. Consider adding ACE once new baseline crt obtained given proteinuria.  - CBC - Comprehensive metabolic panel - TSH - Lipid panel - Care order/instruction:  Other orders - metoprolol succinate (TOPROL-XL) 25 MG 24 hr tablet; Take 1 tablet (25 mg total) by mouth daily. - cyclobenzaprine (FLEXERIL) 10 MG tablet; Take 1 tablet (10 mg total) by mouth 3 (three) times daily as needed  for muscle spasms.    Return in about 1 week (around 09/11/2018) for blood pressure.    Rutherford Guys, MD [facility] at [place], [place] [address] Ph.  [phone] Fax [phone]

## 2018-09-04 NOTE — Patient Instructions (Addendum)
Starting metoprolol XL 25mg  once a day, continue with amlodipine 10mg  daily Check your BP at home  No ibuporfen or similar, ok to do tylenol   Hypertension Hypertension, commonly called high blood pressure, is when the force of blood pumping through the arteries is too strong. The arteries are the blood vessels that carry blood from the heart throughout the body. Hypertension forces the heart to work harder to pump blood and may cause arteries to become narrow or stiff. Having untreated or uncontrolled hypertension can cause heart attacks, strokes, kidney disease, and other problems. A blood pressure reading consists of a higher number over a lower number. Ideally, your blood pressure should be below 120/80. The first ("top") number is called the systolic pressure. It is a measure of the pressure in your arteries as your heart beats. The second ("bottom") number is called the diastolic pressure. It is a measure of the pressure in your arteries as the heart relaxes. What are the causes? The cause of this condition is not known. What increases the risk? Some risk factors for high blood pressure are under your control. Others are not. Factors you can change  Smoking.  Having type 2 diabetes mellitus, high cholesterol, or both.  Not getting enough exercise or physical activity.  Being overweight.  Having too much fat, sugar, calories, or salt (sodium) in your diet.  Drinking too much alcohol. Factors that are difficult or impossible to change  Having chronic kidney disease.  Having a family history of high blood pressure.  Age. Risk increases with age.  Race. You may be at higher risk if you are African-American.  Gender. Men are at higher risk than women before age 47. After age 67, women are at higher risk than men.  Having obstructive sleep apnea.  Stress. What are the signs or symptoms? Extremely high blood pressure (hypertensive crisis) may  cause:  Headache.  Anxiety.  Shortness of breath.  Nosebleed.  Nausea and vomiting.  Severe chest pain.  Jerky movements you cannot control (seizures).  How is this diagnosed? This condition is diagnosed by measuring your blood pressure while you are seated, with your arm resting on a surface. The cuff of the blood pressure monitor will be placed directly against the skin of your upper arm at the level of your heart. It should be measured at least twice using the same arm. Certain conditions can cause a difference in blood pressure between your right and left arms. Certain factors can cause blood pressure readings to be lower or higher than normal (elevated) for a short period of time:  When your blood pressure is higher when you are in a health care provider's office than when you are at home, this is called white coat hypertension. Most people with this condition do not need medicines.  When your blood pressure is higher at home than when you are in a health care provider's office, this is called masked hypertension. Most people with this condition may need medicines to control blood pressure.  If you have a high blood pressure reading during one visit or you have normal blood pressure with other risk factors:  You may be asked to return on a different day to have your blood pressure checked again.  You may be asked to monitor your blood pressure at home for 1 week or longer.  If you are diagnosed with hypertension, you may have other blood or imaging tests to help your health care provider understand your overall risk for  other conditions. How is this treated? This condition is treated by making healthy lifestyle changes, such as eating healthy foods, exercising more, and reducing your alcohol intake. Your health care provider may prescribe medicine if lifestyle changes are not enough to get your blood pressure under control, and if:  Your systolic blood pressure is above  130.  Your diastolic blood pressure is above 80.  Your personal target blood pressure may vary depending on your medical conditions, your age, and other factors. Follow these instructions at home: Eating and drinking  Eat a diet that is high in fiber and potassium, and low in sodium, added sugar, and fat. An example eating plan is called the DASH (Dietary Approaches to Stop Hypertension) diet. To eat this way: ? Eat plenty of fresh fruits and vegetables. Try to fill half of your plate at each meal with fruits and vegetables. ? Eat whole grains, such as whole wheat pasta, brown rice, or whole grain bread. Fill about one quarter of your plate with whole grains. ? Eat or drink low-fat dairy products, such as skim milk or low-fat yogurt. ? Avoid fatty cuts of meat, processed or cured meats, and poultry with skin. Fill about one quarter of your plate with lean proteins, such as fish, chicken without skin, beans, eggs, and tofu. ? Avoid premade and processed foods. These tend to be higher in sodium, added sugar, and fat.  Reduce your daily sodium intake. Most people with hypertension should eat less than 1,500 mg of sodium a day.  Limit alcohol intake to no more than 1 drink a day for nonpregnant women and 2 drinks a day for men. One drink equals 12 oz of beer, 5 oz of wine, or 1 oz of hard liquor. Lifestyle  Work with your health care provider to maintain a healthy body weight or to lose weight. Ask what an ideal weight is for you.  Get at least 30 minutes of exercise that causes your heart to beat faster (aerobic exercise) most days of the week. Activities may include walking, swimming, or biking.  Include exercise to strengthen your muscles (resistance exercise), such as pilates or lifting weights, as part of your weekly exercise routine. Try to do these types of exercises for 30 minutes at least 3 days a week.  Do not use any products that contain nicotine or tobacco, such as cigarettes and  e-cigarettes. If you need help quitting, ask your health care provider.  Monitor your blood pressure at home as told by your health care provider.  Keep all follow-up visits as told by your health care provider. This is important. Medicines  Take over-the-counter and prescription medicines only as told by your health care provider. Follow directions carefully. Blood pressure medicines must be taken as prescribed.  Do not skip doses of blood pressure medicine. Doing this puts you at risk for problems and can make the medicine less effective.  Ask your health care provider about side effects or reactions to medicines that you should watch for. Contact a health care provider if:  You think you are having a reaction to a medicine you are taking.  You have headaches that keep coming back (recurring).  You feel dizzy.  You have swelling in your ankles.  You have trouble with your vision. Get help right away if:  You develop a severe headache or confusion.  You have unusual weakness or numbness.  You feel faint.  You have severe pain in your chest or abdomen.  You  vomit repeatedly.  You have trouble breathing. Summary  Hypertension is when the force of blood pumping through your arteries is too strong. If this condition is not controlled, it may put you at risk for serious complications.  Your personal target blood pressure may vary depending on your medical conditions, your age, and other factors. For most people, a normal blood pressure is less than 120/80.  Hypertension is treated with lifestyle changes, medicines, or a combination of both. Lifestyle changes include weight loss, eating a healthy, low-sodium diet, exercising more, and limiting alcohol. This information is not intended to replace advice given to you by your health care provider. Make sure you discuss any questions you have with your health care provider. Document Released: 10/17/2005 Document Revised: 09/14/2016  Document Reviewed: 09/14/2016 Elsevier Interactive Patient Education  [employer].   If you have lab work done today you will be contacted with your lab results within the next 2 weeks.  If you have not heard from [place] then please contact us. The fastest way to get your results is to register for My Chart.   IF you received an x-ray today, you will receive an invoice from [facility]. Please contact [facility] at [phone] with questions or concerns regarding your invoice.   IF you received labwork today, you will receive an invoice from [facility]. Please contact [facility] at [phone] with questions or concerns regarding your invoice.   Our billing staff will not be able to assist you with questions regarding bills from these companies.  You will be contacted with the lab results as soon as they are available. The fastest way to get your results is to activate your My Chart account. Instructions are located on the last page of this paperwork. If you have not heard from [place] regarding the results in 2 weeks, please contact this office.

## 2018-09-05 LAB — COMPREHENSIVE METABOLIC PANEL
ALT: 12 IU/L (ref 0–44)
AST: 17 IU/L (ref 0–40)
Albumin/Globulin Ratio: 1.8 (ref 1.2–2.2)
Albumin: 4.4 g/dL (ref 3.6–4.8)
Alkaline Phosphatase: 60 IU/L (ref 39–117)
BUN/Creatinine Ratio: 12 (ref 10–24)
BUN: 21 mg/dL (ref 8–27)
Bilirubin Total: 0.3 mg/dL (ref 0.0–1.2)
CO2: 28 mmol/L (ref 20–29)
Calcium: 9.5 mg/dL (ref 8.6–10.2)
Chloride: 98 mmol/L (ref 96–106)
Creatinine, Ser: 1.81 mg/dL — ABNORMAL HIGH (ref 0.76–1.27)
GFR calc Af Amer: 45 mL/min/{1.73_m2} — ABNORMAL LOW (ref >59)
GFR calc non Af Amer: 39 mL/min/{1.73_m2} — ABNORMAL LOW (ref >59)
Globulin, Total: 2.4 g/dL (ref 1.5–4.5)
Glucose: 109 mg/dL — ABNORMAL HIGH (ref 65–99)
Potassium: 3.7 mmol/L (ref 3.5–5.2)
Sodium: 143 mmol/L (ref 134–144)
Total Protein: 6.8 g/dL (ref 6.0–8.5)

## 2018-09-05 LAB — CBC
Hematocrit: 41.1 % (ref 37.5–51.0)
Hemoglobin: 13.4 g/dL (ref 13.0–17.7)
MCH: 23.1 pg — ABNORMAL LOW (ref 26.6–33.0)
MCHC: 32.6 g/dL (ref 31.5–35.7)
MCV: 71 fL — ABNORMAL LOW (ref 79–97)
Platelets: 190 10*3/uL (ref 150–450)
RBC: 5.79 x10E6/uL (ref 4.14–5.80)
RDW: 15.8 % — ABNORMAL HIGH (ref 12.3–15.4)
WBC: 5.6 10*3/uL (ref 3.4–10.8)

## 2018-09-05 LAB — LIPID PANEL
Chol/HDL Ratio: 3.9 ratio (ref 0.0–5.0)
Cholesterol, Total: 214 mg/dL — ABNORMAL HIGH (ref 100–199)
HDL: 55 mg/dL (ref >39)
LDL Calculated: 116 mg/dL — ABNORMAL HIGH (ref 0–99)
Triglycerides: 213 mg/dL — ABNORMAL HIGH (ref 0–149)
VLDL Cholesterol Cal: 43 mg/dL — ABNORMAL HIGH (ref 5–40)

## 2018-09-05 LAB — TSH: TSH: 2.16 u[IU]/mL (ref 0.450–4.500)

## 2018-09-11 ENCOUNTER — Telehealth: Payer: Self-pay | Admitting: Emergency Medicine

## 2018-09-11 NOTE — Telephone Encounter (Signed)
Copied from [place] [phone] [phone]. 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

## 2018-09-17 ENCOUNTER — Encounter: Payer: Self-pay | Admitting: *Deleted

## 2018-10-01 ENCOUNTER — Other Ambulatory Visit: Payer: Self-pay | Admitting: Family Medicine

## 2018-12-06 ENCOUNTER — Other Ambulatory Visit: Payer: Self-pay

## 2018-12-06 ENCOUNTER — Ambulatory Visit (INDEPENDENT_AMBULATORY_CARE_PROVIDER_SITE_OTHER): Payer: 59

## 2018-12-06 ENCOUNTER — Ambulatory Visit: Payer: 59 | Admitting: Physician Assistant

## 2018-12-06 ENCOUNTER — Encounter: Payer: Self-pay | Admitting: Physician Assistant

## 2018-12-06 VITALS — BP 162/100 | HR 83 | Temp 98.0°F | Resp 16 | Ht 69.0 in | Wt 181.0 lb

## 2018-12-06 DIAGNOSIS — Z8679 Personal history of other diseases of the circulatory system: Secondary | ICD-10-CM | POA: Diagnosis not present

## 2018-12-06 DIAGNOSIS — M109 Gout, unspecified: Secondary | ICD-10-CM | POA: Diagnosis not present

## 2018-12-06 DIAGNOSIS — M25572 Pain in left ankle and joints of left foot: Secondary | ICD-10-CM

## 2018-12-06 DIAGNOSIS — Z91148 Patient's other noncompliance with medication regimen for other reason: Secondary | ICD-10-CM

## 2018-12-06 DIAGNOSIS — Z9114 Patient's other noncompliance with medication regimen: Secondary | ICD-10-CM

## 2018-12-06 MED ORDER — LISINOPRIL 10 MG PO TABS: 10.0000 mg | ORAL_TABLET | Freq: Every day | ORAL | 3 refills | Status: DC

## 2018-12-06 MED ORDER — DICLOFENAC SODIUM 75 MG PO TBEC: 75.0000 mg | DELAYED_RELEASE_TABLET | Freq: Two times a day (BID) | ORAL | 0 refills | Status: DC

## 2018-12-06 MED ORDER — COLCHICINE 0.6 MG PO TABS: ORAL_TABLET | ORAL | 0 refills | Status: DC

## 2018-12-06 NOTE — Patient Instructions (Addendum)
According to your lab result notes from Dr. Pamella Pert your kidney function is worsening. Uncontrolled high blood pressure causes kidney problems - you need to take charge of controlling blood pressure. Start coming in to see Dr. Pamella Pert on a regular basis (at least 3-4 times a year) to check blood pressure and kidney function.   For your blood pressure you should be taking 3 medications: Norvasc 10mg  daily Metoprolol 25mg  daily Lisinopril 10mg   Continue your aspirin. Come back in 3-4 weeks to recheck your blood pressure with Dr. Pamella Pert   Your x-ray looks fine Take colchicine twice daily until your gout flare resolves, then take one daily for 7 days.  Voltaren as needed for pain   DASH Eating Plan DASH stands for "Dietary Approaches to Stop Hypertension." The DASH eating plan is a healthy eating plan that has been shown to reduce high blood pressure (hypertension). It may also reduce your risk for type 2 diabetes, heart disease, and stroke. The DASH eating plan may also help with weight loss. What are tips for following this plan?  General guidelines  Avoid eating more than 2,300 mg (milligrams) of salt (sodium) a day. If you have hypertension, you may need to reduce your sodium intake to 1,500 mg a day.  Limit alcohol intake to no more than 1 drink a day for nonpregnant women and 2 drinks a day for men. One drink equals 12 oz of beer, 5 oz of wine, or 1 oz of hard liquor.  Work with your health care provider to maintain a healthy body weight or to lose weight. Ask what an ideal weight is for you.  Get at least 30 minutes of exercise that causes your heart to beat faster (aerobic exercise) most days of the week. Activities may include walking, swimming, or biking.  Work with your health care provider or diet and nutrition specialist (dietitian) to adjust your eating plan to your individual calorie needs. Reading food labels   Check food labels for the amount of sodium per  serving. Choose foods with less than 5 percent of the Daily Value of sodium. Generally, foods with less than 300 mg of sodium per serving fit into this eating plan.  To find whole grains, look for the word "whole" as the first word in the ingredient list. Shopping  Buy products labeled as "low-sodium" or "no salt added."  Buy fresh foods. Avoid canned foods and premade or frozen meals. Cooking  Avoid adding salt when cooking. Use salt-free seasonings or herbs instead of table salt or sea salt. Check with your health care provider or pharmacist before using salt substitutes.  Do not fry foods. Cook foods using healthy methods such as baking, boiling, grilling, and broiling instead.  Cook with heart-healthy oils, such as olive, canola, soybean, or sunflower oil. Meal planning  Eat a balanced diet that includes: ? 5 or more servings of fruits and vegetables each day. At each meal, try to fill half of your plate with fruits and vegetables. ? Up to 6-8 servings of whole grains each day. ? Less than 6 oz of lean meat, poultry, or fish each day. A 3-oz serving of meat is about the same size as a deck of cards. One egg equals 1 oz. ? 2 servings of low-fat dairy each day. ? A serving of nuts, seeds, or beans 5 times each week. ? Heart-healthy fats. Healthy fats called Omega-3 fatty acids are found in foods such as flaxseeds and coldwater fish, like sardines, salmon, and  mackerel.  Limit how much you eat of the following: ? Canned or prepackaged foods. ? Food that is high in trans fat, such as fried foods. ? Food that is high in saturated fat, such as fatty meat. ? Sweets, desserts, sugary drinks, and other foods with added sugar. ? Full-fat dairy products.  Do not salt foods before eating.  Try to eat at least 2 vegetarian meals each week.  Eat more home-cooked food and less restaurant, buffet, and fast food.  When eating at a restaurant, ask that your food be prepared with less salt or  no salt, if possible. What foods are recommended? The items listed may not be a complete list. Talk with your dietitian about what dietary choices are best for you. Grains Whole-grain or whole-wheat bread. Whole-grain or whole-wheat pasta. Brown rice. [place]. Bulgur. Whole-grain and low-sodium cereals. Pita bread. Low-fat, low-sodium crackers. Whole-wheat flour tortillas. Vegetables Fresh or frozen vegetables (raw, steamed, roasted, or grilled). Low-sodium or reduced-sodium tomato and vegetable juice. Low-sodium or reduced-sodium tomato sauce and tomato paste. Low-sodium or reduced-sodium canned vegetables. Fruits All fresh, dried, or frozen fruit. Canned fruit in natural juice (without added sugar). Meat and other protein foods Skinless chicken or [place]. Ground chicken or [place]. Pork with fat trimmed off. Fish and seafood. Egg whites. Dried beans, peas, or lentils. Unsalted nuts, nut butters, and seeds. Unsalted canned beans. Lean cuts of beef with fat trimmed off. Low-sodium, lean deli meat. Dairy Low-fat (1%) or fat-free (skim) milk. Fat-free, low-fat, or reduced-fat cheeses. Nonfat, low-sodium ricotta or cottage cheese. Low-fat or nonfat yogurt. Low-fat, low-sodium cheese. Fats and oils Soft margarine without trans fats. Vegetable oil. Low-fat, reduced-fat, or light mayonnaise and salad dressings (reduced-sodium). Canola, safflower, olive, soybean, and sunflower oils. Avocado. Seasoning and other foods Herbs. Spices. Seasoning mixes without salt. Unsalted popcorn and pretzels. Fat-free sweets. What foods are not recommended? The items listed may not be a complete list. Talk with your dietitian about what dietary choices are best for you. Grains Baked goods made with fat, such as croissants, muffins, or some breads. Dry pasta or rice meal packs. Vegetables Creamed or fried vegetables. Vegetables in a cheese sauce. Regular canned vegetables (not low-sodium or reduced-sodium).  Regular canned tomato sauce and paste (not low-sodium or reduced-sodium). Regular tomato and vegetable juice (not low-sodium or reduced-sodium). Angie Fava. Olives. Fruits Canned fruit in a light or heavy syrup. Fried fruit. Fruit in cream or butter sauce. Meat and other protein foods Fatty cuts of meat. Ribs. Fried meat. Berniece Salines. Sausage. Bologna and other processed lunch meats. Salami. Fatback. Hotdogs. Bratwurst. Salted nuts and seeds. Canned beans with added salt. Canned or smoked fish. Whole eggs or egg yolks. Chicken or [place] with skin. Dairy Whole or 2% milk, cream, and half-and-half. Whole or full-fat cream cheese. Whole-fat or sweetened yogurt. Full-fat cheese. Nondairy creamers. Whipped toppings. Processed cheese and cheese spreads. Fats and oils Butter. Stick margarine. Lard. Shortening. Ghee. Bacon fat. Tropical oils, such as coconut, palm kernel, or palm oil. Seasoning and other foods Salted popcorn and pretzels. Onion salt, garlic salt, seasoned salt, table salt, and sea salt. Worcestershire sauce. Tartar sauce. Barbecue sauce. Teriyaki sauce. Soy sauce, including reduced-sodium. Steak sauce. Canned and packaged gravies. Fish sauce. Oyster sauce. Cocktail sauce. Horseradish that you find on the shelf. Ketchup. Mustard. Meat flavorings and tenderizers. Bouillon cubes. Hot sauce and Tabasco sauce. Premade or packaged marinades. Premade or packaged taco seasonings. Relishes. Regular salad dressings. Where to find more information:  National Heart, Lung,  and Blood Institute: https://wilson-eaton.com/  American Heart Association: www.heart.org Summary  The DASH eating plan is a healthy eating plan that has been shown to reduce high blood pressure (hypertension). It may also reduce your risk for type 2 diabetes, heart disease, and stroke.  With the DASH eating plan, you should limit salt (sodium) intake to 2,300 mg a day. If you have hypertension, you may need to reduce your sodium intake to 1,500 mg  a day.  When on the DASH eating plan, aim to eat more fresh fruits and vegetables, whole grains, lean proteins, low-fat dairy, and heart-healthy fats.  Work with your health care provider or diet and nutrition specialist (dietitian) to adjust your eating plan to your individual calorie needs. This information is not intended to replace advice given to you by your health care provider. Make sure you discuss any questions you have with your health care provider. Document Released: 10/06/2011 Document Revised: 10/10/2016 Document Reviewed: 10/10/2016 Elsevier Interactive Patient Education  2019 [employer].  IF you received an x-ray today, you will receive an invoice from [facility]. Please contact [facility] at [phone] with questions or concerns regarding your invoice.   IF you received labwork today, you will receive an invoice from [facility]. Please contact [facility] at [phone] with questions or concerns regarding your invoice.   Our billing staff will not be able to assist you with questions regarding bills from these companies.  You will be contacted with the lab results as soon as they are available. The fastest way to get your results is to activate your My Chart account. Instructions are located on the last page of this paperwork. If you have not heard from [place] regarding the results in 2 weeks, please contact this office.

## 2018-12-06 NOTE — Progress Notes (Signed)
Ruben Camacho.  MRN: [id number] DOB: 1956/03/26  PCP: Horald Pollen, MD  Subjective:  Pt is a 63 year old male who presents to clinic for left ankle pain x 2 days.  Endorses mild pain of left knee as well.  Pain with walking.  He admits to eating more meat that normal this past weekend for the superbowl. Denies drinking alcohol.  He has been applying ice.  H/o gout. Last gout attack 12/2016. Rx for colchicine 0.6mg  and voltaren.   HTN - uncontrolled. H/o medication non-compliance and stroke.  He is not too sure what medications he is taking "I take 2 blood pressure medicines and an aspirin", but according to last OV note 08/2018 added Metoprolol to Norvasc 10mg  and chlorthalidone. He was asked to f/u in 2 weeks and did not.  He checks home blood pressures and they are around 200/100.  Denies lightheadedness, dizziness, chronic headache, double vision, chest pain, shortness of breath, heart racing, palpitations, nausea, vomiting, abdominal pain, hematuria, lower leg swelling.    Review of Systems  Constitutional: Negative for chills and diaphoresis.  Respiratory: Negative for cough, chest tightness, shortness of breath and wheezing.   Cardiovascular: Negative for chest pain, palpitations and leg swelling.  Gastrointestinal: Negative for diarrhea, nausea and vomiting.  Musculoskeletal: Positive for arthralgias (L ankle), gait problem and joint swelling. Negative for neck pain.  Neurological: Negative for dizziness, syncope, light-headedness and headaches.  Psychiatric/Behavioral: Negative for sleep disturbance. The patient is not nervous/anxious.     Patient Active Problem List   Diagnosis Date Noted  . Sprain of left ankle 11/09/2016  . Intractable migraine without aura and without status migrainosus 01/08/2015  . Hypertension, uncontrolled 11/30/2011  . Hyperlipemia 11/30/2011  . Gout 11/30/2011  . Beta thalassemia ([place]) 11/30/2011    Current Outpatient  Medications on File Prior to Visit  Medication Sig Dispense Refill  . amLODipine (NORVASC) 10 MG tablet Take 1 tablet (10 mg total) by mouth daily. 90 tablet 3  . aspirin 81 MG tablet Take 81 mg by mouth daily.    . chlorthalidone (HYGROTON) 50 MG tablet Take 1 tablet (50 mg total) by mouth daily. 30 tablet 2  . cyclobenzaprine (FLEXERIL) 10 MG tablet Take 1 tablet (10 mg total) by mouth 3 (three) times daily as needed for muscle spasms. 30 tablet 0  . hydrochlorothiazide (HYDRODIURIL) 50 MG tablet Take 1 tablet (50 mg total) by mouth daily. 90 tablet 1  . hydrOXYzine (VISTARIL) 50 MG capsule Take 1-2 capsules (50-100 mg total) by mouth 4 (four) times daily as needed for anxiety. 60 capsule 1  . metoprolol succinate (TOPROL-XL) 25 MG 24 hr tablet TAKE 1 TABLET(25 MG) BY MOUTH DAILY 90 tablet 0  . traZODone (DESYREL) 50 MG tablet Take 1-2 tablets (50-100 mg total) by mouth at bedtime as needed for sleep. 30 tablet 3   No current facility-administered medications on file prior to visit.     No Known Allergies   Objective:  BP (!) 180/100   Pulse 83   Temp 98 F (36.7 C) (Oral)   Resp 16   Ht 5\' 9"  (1.753 m)   Wt 181 lb (82.1 kg)   SpO2 98%   BMI 26.73 kg/m   Physical Exam Vitals signs reviewed.  Constitutional:      Appearance: Normal appearance.  Cardiovascular:     Rate and Rhythm: Normal rate and regular rhythm.  Musculoskeletal:     Left ankle: He exhibits decreased range of motion.  He exhibits no swelling and no ecchymosis. Tenderness.       Feet:  Neurological:     Mental Status: He is alert.  Psychiatric:        Mood and Affect: Mood normal.        Behavior: Behavior normal.        Thought Content: Thought content normal.     Dg Ankle Complete Left  Result Date: 12/06/2018 CLINICAL DATA:  left ankle pain. No MOI. suspect gout flare. EXAM: LEFT ANKLE COMPLETE - 3+ VIEW COMPARISON:  01/05/2017 FINDINGS: There is a remote healed fracture of the LOWER LEFT fibula.  Mild degenerative changes are identified at the MEDIAL and LATERAL malleolus. No acute fracture or subluxation. Small plantar calcaneal spur is present. IMPRESSION: No evidence for acute abnormality. Electronically Signed   By: Nolon Nations M.D.   On: 12/06/2018 10:12    Assessment and Plan :  1. Left ankle pain, unspecified chronicity 2. Acute gout of left ankle, unspecified cause - Pt presents c/o left ankle pain x 2 days. H/o gout. Last flare 2 years ago.  - DG Ankle Complete Left; Future - diclofenac (VOLTAREN) 75 MG EC tablet; Take 1 tablet (75 mg total) by mouth 2 (two) times daily.  Dispense: 30 tablet; Refill: 0 - colchicine 0.6 MG tablet; 1 tab bid til gout resolves, then qd for 7 more days  Dispense: 16 tablet; Refill: 0  3. H/O medication noncompliance 4. History of uncontrolled hypertension - Pt has h/o uncontrolled HTN with medication noncompliance. today's blood pressure is 180/100, he is asymptomatic.  Home blood pressures are also high. Last OV was with Dr. Pamella Pert 11/05 for back pain and he had elevated blood pressure at that visit. Dr. Pamella Pert added metoprolol and asked him to f/u in 2 weeks, which he never did. He is unsure which blood pressure medications he is currently taking, but thinks he takes two. I called his pharmacy and he has not had refills of Norvasc, chlorthalidone or HCTZ in a few months. It appears he is only taking metoprolol. Discussed with pt need to control blood pressure and to see PCP regularly. Plan to con't metoprolol. Will refill Norvasc and add Lisinopril due to proteinuria and crt. F/u with Dr. Pamella Pert in 3 weeks.  - lisinopril (PRINIVIL,ZESTRIL) 10 MG tablet; Take 1 tablet (10 mg total) by mouth daily.  Dispense: 90 tablet; Refill: 3   Whitney Georgean Spainhower, PA-C  [facility] 12/06/2018 9:47 AM  Please note: Portions of this report may have been transcribed using dragon voice recognition software. Every effort was  made to ensure accuracy; however, inadvertent computerized transcription errors may be present.

## 2018-12-18 ENCOUNTER — Other Ambulatory Visit: Payer: Self-pay | Admitting: Physician Assistant

## 2018-12-18 DIAGNOSIS — M25572 Pain in left ankle and joints of left foot: Secondary | ICD-10-CM

## 2018-12-18 NOTE — Telephone Encounter (Signed)
Requested Prescriptions  Pending Prescriptions Disp Refills  . diclofenac (VOLTAREN) 75 MG EC tablet [Pharmacy Med Name: DICLOFENAC SODIUM 75MG  DR TABLETS] 30 tablet 0    Sig: TAKE 1 TABLET(75 MG) BY MOUTH TWICE DAILY     Analgesics:  NSAIDS Failed - 12/18/2018  3:44 AM      Failed - Cr in normal range and within 360 days    Creat  Date Value Ref Range Status  08/19/2015 1.28 0.70 - 1.33 mg/dL Final   Creatinine, Ser  Date Value Ref Range Status  09/04/2018 1.81 (H) 0.76 - 1.27 mg/dL Final         Passed - HGB in normal range and within 360 days    Hemoglobin  Date Value Ref Range Status  09/04/2018 13.4 13.0 - 17.7 g/dL Final         Passed - Patient is not pregnant      Passed - Valid encounter within last 12 months    Recent Outpatient Visits          1 week ago Left ankle pain, unspecified chronicity   Primary Care at [facility], [place], PA-C   3 months ago Back pain, unspecified back location, unspecified back pain laterality, unspecified chronicity   [facility], Lilia Argue, MD   11 months ago Essential hypertension   Primary Care at [facility], [place], PA-C   1 year ago Essential hypertension   Primary Care at [facility], [place], PA-C   1 year ago Elevated blood pressure reading   Primary Care at [facility], Gelene Mink, [place]

## 2019-01-06 ENCOUNTER — Other Ambulatory Visit: Payer: Self-pay | Admitting: Physician Assistant

## 2019-01-06 DIAGNOSIS — M25572 Pain in left ankle and joints of left foot: Secondary | ICD-10-CM

## 2019-01-07 NOTE — Telephone Encounter (Signed)
Requested medication (s) are due for refill today: yes  Requested medication (s) are on the active medication list: yes  Last refill:  12/18/18 #30  Future visit scheduled: no  Notes to clinic:  Request for Voltaren   Requested Prescriptions  Pending Prescriptions Disp Refills   diclofenac (VOLTAREN) 75 MG EC tablet [Pharmacy Med Name: DICLOFENAC SODIUM 75MG  DR TABLETS] 30 tablet 0    Sig: TAKE 1 TABLET(75 MG) BY MOUTH TWICE DAILY     Analgesics:  NSAIDS Failed - 01/06/2019  4:02 AM      Failed - Cr in normal range and within 360 days    Creat  Date Value Ref Range Status  08/19/2015 1.28 0.70 - 1.33 mg/dL Final   Creatinine, Ser  Date Value Ref Range Status  09/04/2018 1.81 (H) 0.76 - 1.27 mg/dL Final         Passed - HGB in normal range and within 360 days    Hemoglobin  Date Value Ref Range Status  09/04/2018 13.4 13.0 - 17.7 g/dL Final         Passed - Patient is not pregnant      Passed - Valid encounter within last 12 months    Recent Outpatient Visits          1 month ago Left ankle pain, unspecified chronicity   [facility] Care at [facility], [place], PA-C   4 months ago Back pain, unspecified back location, unspecified back pain laterality, unspecified chronicity   [facility], Lilia Argue, MD   11 months ago Essential hypertension   Primary Care at [facility], Gelene Mink, PA-C   1 year ago Essential hypertension   Primary Care at [facility], [place], PA-C   1 year ago Elevated blood pressure reading   Primary Care at [facility], Gelene Mink, [place]

## 2019-01-20 ENCOUNTER — Other Ambulatory Visit: Payer: Self-pay | Admitting: Emergency Medicine

## 2019-01-20 DIAGNOSIS — M25572 Pain in left ankle and joints of left foot: Secondary | ICD-10-CM

## 2019-01-20 NOTE — Telephone Encounter (Signed)
Requested medication (s) are due for refill today: yes  Requested medication (s) are on the active medication list: yes  Last refill:  01/08/19  Future visit scheduled: No  Notes to clinic:  Unable to refill per protocol    Requested Prescriptions  Pending Prescriptions Disp Refills   diclofenac (VOLTAREN) 75 MG EC tablet [Pharmacy Med Name: DICLOFENAC SODIUM 75MG  DR TABLETS] 30 tablet 0    Sig: TAKE 1 TABLET(75 MG) BY MOUTH TWICE DAILY     Analgesics:  NSAIDS Failed - 01/20/2019  3:44 AM      Failed - Cr in normal range and within 360 days    Creat  Date Value Ref Range Status  08/19/2015 1.28 0.70 - 1.33 mg/dL Final   Creatinine, Ser  Date Value Ref Range Status  09/04/2018 1.81 (H) 0.76 - 1.27 mg/dL Final         Failed - Valid encounter within last 12 months    Recent Outpatient Visits          1 month ago Left ankle pain, unspecified chronicity   [facility] at [place], PA-C   4 months ago Back pain, unspecified back location, unspecified back pain laterality, unspecified chronicity   [facility], Lilia Argue, MD   1 year ago Essential hypertension   Primary Care at [facility], Gel[place] Mink, PA-C   1 year ago Essential hypertension   Primary Care at [facility], [place], PA-C   1 year ago Elevated blood pressure reading   [facility] Care at [facility], [place], PA-C             Passed - HGB in normal range and within 360 days    Hemoglobin  Date Value Ref Range Status  09/04/2018 13.4 13.0 - 17.7 g/dL Final         Passed - Patient is not pregnant

## 2019-01-31 ENCOUNTER — Other Ambulatory Visit: Payer: Self-pay | Admitting: Family Medicine

## 2019-02-27 ENCOUNTER — Other Ambulatory Visit: Payer: Self-pay | Admitting: Family Medicine

## 2019-03-16 ENCOUNTER — Other Ambulatory Visit: Payer: Self-pay | Admitting: Emergency Medicine

## 2019-03-16 DIAGNOSIS — M25572 Pain in left ankle and joints of left foot: Secondary | ICD-10-CM

## 2019-04-09 NOTE — Telephone Encounter (Signed)
LVM to schedule appt

## 2019-05-27 ENCOUNTER — Other Ambulatory Visit: Payer: Self-pay | Admitting: Family Medicine

## 2019-05-27 NOTE — Telephone Encounter (Signed)
Requested medications are due for refill today?  Yes  Requested medications are on the active medication list?  Yes  Last refill [id number]  Future visit scheduled?  No  Notes to clinic-  Left voicemail for patient to return call to schedule follow up appointment.    Requested Prescriptions  Pending Prescriptions Disp Refills   metoprolol succinate (TOPROL-XL) 25 MG 24 hr tablet [Pharmacy Med Name: METOPROLOL ER SUCCINATE 25MG  TABS] 90 tablet 0    Sig: TAKE 1 TABLET(25 MG) BY MOUTH DAILY     Cardiovascular:  Beta Blockers Failed - 05/27/2019  3:44 AM      Failed - Last BP in normal range    BP Readings from Last 1 Encounters:  12/06/18 (!) 162/100         Failed - Valid encounter within last 6 months    Recent Outpatient Visits          5 months ago Left ankle pain, unspecified chronicity   [facility] at [place], PA-C   8 months ago Back pain, unspecified back location, unspecified back pain laterality, unspecified chronicity   [facility], Lilia Argue, MD   1 year ago Essential hypertension   Primary Care at [facility], Gelene Mink, PA-C   1 year ago Essential hypertension   Primary Care at [facility], [place], PA-C   1 year ago Elevated blood pressure reading   Primary Care at [facility], [place], [place]             Passed - Last Heart Rate in normal range    Pulse Readings from Last 1 Encounters:  12/06/18 83

## 2019-06-11 DIAGNOSIS — N179 Acute kidney failure, unspecified: Secondary | ICD-10-CM | POA: Insufficient documentation

## 2019-07-03 NOTE — Telephone Encounter (Signed)
LVM to schedule appt for med refills.

## 2019-07-25 DIAGNOSIS — E119 Type 2 diabetes mellitus without complications: Secondary | ICD-10-CM | POA: Insufficient documentation

## 2019-08-06 ENCOUNTER — Other Ambulatory Visit: Payer: Self-pay

## 2019-08-06 ENCOUNTER — Encounter: Payer: Self-pay | Admitting: Adult Health Nurse Practitioner

## 2019-08-06 ENCOUNTER — Other Ambulatory Visit (HOSPITAL_COMMUNITY)
Admission: RE | Admit: 2019-08-06 | Discharge: 2019-08-06 | Disposition: A | Discharge status: Home / Self Care | Payer: BC Managed Care – PPO | Source: Ambulatory Visit | Attending: Adult Health Nurse Practitioner | Admitting: Adult Health Nurse Practitioner

## 2019-08-06 ENCOUNTER — Ambulatory Visit (INDEPENDENT_AMBULATORY_CARE_PROVIDER_SITE_OTHER): Payer: Self-pay | Admitting: Adult Health Nurse Practitioner

## 2019-08-06 VITALS — BP 170/88 | HR 60 | Temp 98.0°F | Resp 16 | Wt 191.0 lb

## 2019-08-06 DIAGNOSIS — I10 Essential (primary) hypertension: Secondary | ICD-10-CM

## 2019-08-06 DIAGNOSIS — Z113 Encounter for screening for infections with a predominantly sexual mode of transmission: Secondary | ICD-10-CM | POA: Insufficient documentation

## 2019-08-06 MED ORDER — GLIPIZIDE ER 2.5 MG PO TB24: 2.5000 mg | ORAL_TABLET | Freq: Every day | ORAL | 4 refills | Status: DC

## 2019-08-06 MED ORDER — LOSARTAN POTASSIUM-HCTZ 100-25 MG PO TABS: 1.0000 | ORAL_TABLET | Freq: Every day | ORAL | 4 refills | Status: DC

## 2019-08-06 MED ORDER — AMLODIPINE BESYLATE 10 MG PO TABS: 10.0000 mg | ORAL_TABLET | Freq: Every day | ORAL | 3 refills | Status: DC

## 2019-08-06 MED ORDER — ATORVASTATIN CALCIUM 40 MG PO TABS: 40.0000 mg | ORAL_TABLET | Freq: Every day | ORAL | 4 refills | Status: DC

## 2019-08-06 NOTE — Patient Instructions (Signed)
° ° ° °  If you have lab work done today you will be contacted with your lab results within the next 2 weeks.  If you have not heard from us then please contact us. The fastest way to get your results is to register for My Chart. ° ° °IF you received an x-ray today, you will receive an invoice from [facility]. Please contact [facility] at [phone] with questions or concerns regarding your invoice.  ° °IF you received labwork today, you will receive an invoice from [facility]. Please contact [facility] at [phone] with questions or concerns regarding your invoice.  ° °Our billing staff will not be able to assist you with questions regarding bills from these companies. ° °You will be contacted with the lab results as soon as they are available. The fastest way to get your results is to activate your My Chart account. Instructions are located on the last page of this paperwork. If you have not heard from us regarding the results in 2 weeks, please contact this office. °  ° ° ° °

## 2019-08-06 NOTE — Progress Notes (Addendum)
Established Patient Office Visit  Subjective:  Patient ID: Ruben Aston., male    DOB: 1955-11-13  Age: 63 y.o. MRN: [id number]  CC:  Chief Complaint  Patient presents with  . STD Screening    HPI  Patient is here for his annual STI check.  He reports he has no risks.  He uses condoms.  No hx of STI.    In addition, his BP was elevated today and recently was hospitalized for hypertensive emergency.  He had an Acute Kidney Injury at that time.  He was instructed to follow closely on his BP meds.  Because he is not familiar with the meds he has taken, he brought pill bottle in from the car.  He needs refills.   He previously was a [employer].  Trying to focus on his health.   Denies headaches, SOB, DOE, or chest pain  Ruben Camacho. presents for STI screen and hypertensive urgency.     Past Medical History:  Diagnosis Date  . Gout    1-2 exacerbations per year.  Takes Advil PRN for pain.  Marland Kitchen Hyperlipidemia   . Hypertension   . Migraine   . Stroke ([place]) 07/01/2012   L sided weakness, slurred speech.  Admission [facility].      Past Surgical History:  Procedure Laterality Date  . Admission  07/01/2012   CVA (L sided weakness, slurred speech).  [facility].  . COLONOSCOPY  10/31/2008   no polyps.  Repeat in 10 years.  [place].  Marland Kitchen HERNIA REPAIR      Family History  Problem Relation Age of Onset  . Stroke Mother   . Hypertension Mother   . Hypertension Daughter   . Cancer Maternal Grandmother        lung  . Healthy Father        Died in motorcycle accident    Social History   Socioeconomic History  . Marital status: Married    Spouse name: Not on file  . Number of children: 2  . Years of education: Masters  . Highest education level: Not on file  Occupational History  . Occupation: PRINCIPAL    Employer: [employer]  Social Needs  . Financial resource strain: Not on file  . Food insecurity    Worry: Not on file    Inability: Not on file   . Transportation needs    Medical: Not on file    Non-medical: Not on file  Tobacco Use  . Smoking status: Never Smoker  . Smokeless tobacco: Never Used  Substance and Sexual Activity  . Alcohol use: No    Alcohol/week: 0.0 standard drinks    Comment: former drinker  . Drug use: No  . Sexual activity: Yes    Partners: Female  Lifestyle  . Physical activity    Days per week: Not on file    Minutes per session: Not on file  . Stress: Not on file  Relationships  . Social Herbalist on phone: Not on file    Gets together: Not on file    Attends religious service: Not on file    Active member of club or organization: Not on file    Attends meetings of clubs or organizations: Not on file    Relationship status: Not on file  . Intimate partner violence    Fear of current or ex partner: Not on file    Emotionally abused: Not on file  Physically abused: Not on file    Forced sexual activity: Not on file  Other Topics Concern  . Not on file  Social History Narrative   Marital status: married      CHildren;  2 children; no grandchildren.      Lives at home alone.      Employment:  Pharmacist, hospital; principal in past.      Tobacco: none      Alcohol: weekends      Exercise:  Walking; weightlifting; basketball.   Right handed.   2 cups caffeine/day.    Outpatient Medications Prior to Visit  Medication Sig Dispense Refill  . aspirin 81 MG tablet Take 81 mg by mouth daily.    . Blood Glucose Monitoring Suppl (GLUCOCOM BLOOD GLUCOSE MONITOR) DEVI by Does not apply route.    . diclofenac (VOLTAREN) 75 MG EC tablet TAKE 1 TABLET(75 MG) BY MOUTH TWICE DAILY 30 tablet 0  . glucose blood (PRECISION QID TEST) test strip Use as directed to check blood sugar 2 times a day before meals.    Elmore Guise Devices (SIMPLE DIAGNOSTICS LANCING DEV) MISC Use as directed to check blood sugar 2 times a day before meals.    . Lancets 30G MISC Use as directed to check blood sugar 2 times a day before  meals.    Marland Kitchen losartan (COZAAR) 50 MG tablet Take by mouth.    Marland Kitchen NIFEdipine (PROCARDIA XL/NIFEDICAL-XL) 90 MG 24 hr tablet Take by mouth.    Marland Kitchen amLODipine (NORVASC) 10 MG tablet Take 1 tablet (10 mg total) by mouth daily. 90 tablet 3  . atorvastatin (LIPITOR) 40 MG tablet Take by mouth.    Marland Kitchen glipiZIDE (GLUCOTROL XL) 2.5 MG 24 hr tablet Take by mouth.    Marland Kitchen lisinopril (PRINIVIL,ZESTRIL) 10 MG tablet Take 1 tablet (10 mg total) by mouth daily. 90 tablet 3  . metoprolol succinate (TOPROL-XL) 25 MG 24 hr tablet TAKE 1 TABLET(25 MG) BY MOUTH DAILY 60 tablet 0  . carvedilol (COREG) 25 MG tablet Take by mouth.    . chlorthalidone (HYGROTON) 50 MG tablet Take 1 tablet (50 mg total) by mouth daily. (Patient not taking: Reported on 08/06/2019) 30 tablet 2  . colchicine 0.6 MG tablet 1 tab bid til gout resolves, then qd for 7 more days 16 tablet 0  . hydrochlorothiazide (HYDRODIURIL) 50 MG tablet Take 1 tablet (50 mg total) by mouth daily. (Patient not taking: Reported on 08/06/2019) 90 tablet 1   No facility-administered medications prior to visit.     No Known Allergies  ROS Review of Systems Review of Systems  Constitutional: Negative for activity change, appetite change, chills and fever.  HENT: Negative for congestion, nosebleeds, trouble swallowing and voice change.   Respiratory: Negative for cough, shortness of breath and wheezing.   Gastrointestinal: Negative for diarrhea, nausea and vomiting.  Genitourinary: Negative for difficulty urinating, dysuria, flank pain and hematuria.  Musculoskeletal: Negative for back pain, joint swelling and neck pain.  Neurological: Negative for dizziness, speech difficulty, light-headedness and numbness.  See HPI. All other review of systems negative.     Objective:    Physical Exam  BP (!) 170/88   Pulse 60   Temp 98 F (36.7 C) (Oral)   Resp 16   Wt 191 lb (86.6 kg)   SpO2 98%   BMI 28.21 kg/m  Wt Readings from Last 3 Encounters:  08/06/19 191 lb  (86.6 kg)  12/06/18 181 lb (82.1 kg)  09/04/18 180 lb  9.6 oz (81.9 kg)   Physical Examination: General appearance - alert, well appearing, and in no distress and oriented to person, place, and time Mental status - normal mood, behavior, speech, dress, motor activity, and thought processes Eyes - not examined, Visually impaired -Peter's anomaly Neck - supple, no significant adenopathy, carotids upstroke normal bilaterally, no bruits, thyroid exam: thyroid is normal in size without nodules or tenderness Chest - clear to auscultation, no wheezes, rales or rhonchi, symmetric air entry  Heart - normal rate, regular rhythm, normal S1, S2, no murmurs, rubs, clicks or gallops Extremities - dependent LE edema without clubbing or cyanosis Skin - normal coloration and turgor, no rashes, no suspicious skin lesions noted  No hyperpigmentation of skin.  No current hematomas noted    Health Maintenance Due  Topic Date Due  . HEMOGLOBIN A1C  1956-04-10  . PNEUMOCOCCAL POLYSACCHARIDE VACCINE AGE 26-64 HIGH RISK  12/21/1957  . FOOT EXAM  12/21/1965  . OPHTHALMOLOGY EXAM  12/21/1965  . COLONOSCOPY  10/28/2018    There are no preventive care reminders to display for this patient.  Lab Results  Component Value Date   TSH 2.160 09/04/2018   Lab Results  Component Value Date   WBC 5.6 09/04/2018   HGB 13.4 09/04/2018   HCT 41.1 09/04/2018   MCV 71 (L) 09/04/2018   PLT 190 09/04/2018   Lab Results  Component Value Date   NA 143 09/04/2018   K 3.7 09/04/2018   CO2 28 09/04/2018   GLUCOSE 109 (H) 09/04/2018   BUN 21 09/04/2018   CREATININE 1.81 (H) 09/04/2018   BILITOT 0.3 09/04/2018   ALKPHOS 60 09/04/2018   AST 17 09/04/2018   ALT 12 09/04/2018   PROT 6.8 09/04/2018   ALBUMIN 4.4 09/04/2018   CALCIUM 9.5 09/04/2018   ANIONGAP 10 12/08/2014   Lab Results  Component Value Date   CHOL 214 (H) 09/04/2018   Lab Results  Component Value Date   HDL 55 09/04/2018   Lab Results   Component Value Date   LDLCALC 116 (H) 09/04/2018   Lab Results  Component Value Date   TRIG 213 (H) 09/04/2018   Lab Results  Component Value Date   CHOLHDL 3.9 09/04/2018   No results found for: HGBA1C    Assessment & Plan:   Problem List Items Addressed This Visit      Cardiovascular and Mediastinum   Hypertension, uncontrolled - Primary (Chronic)   Relevant Medications   losartan (COZAAR) 50 MG tablet   NIFEdipine (PROCARDIA XL/NIFEDICAL-XL) 90 MG 24 hr tablet   amLODipine (NORVASC) 10 MG tablet   atorvastatin (LIPITOR) 40 MG tablet   losartan-hydrochlorothiazide (HYZAAR) 100-25 MG tablet   Other Relevant Orders   Hemoglobin A1c   CMP14+EGFR   VITAMIN D 25 Hydroxy (Vit-D Deficiency, Fractures)   TSH    Other Visit Diagnoses    Screening examination for sexually transmitted disease       Relevant Orders   HIV Antibody (routine testing w rflx)   RPR   Urine cytology ancillary only   Essential hypertension       Relevant Medications   losartan (COZAAR) 50 MG tablet   NIFEdipine (PROCARDIA XL/NIFEDICAL-XL) 90 MG 24 hr tablet   amLODipine (NORVASC) 10 MG tablet   atorvastatin (LIPITOR) 40 MG tablet   losartan-hydrochlorothiazide (HYZAAR) 100-25 MG tablet      Meds ordered this encounter  Medications  . amLODipine (NORVASC) 10 MG tablet    Sig: Take 1 tablet (  10 mg total) by mouth daily.    Dispense:  90 tablet    Refill:  3  . atorvastatin (LIPITOR) 40 MG tablet    Sig: Take 1 tablet (40 mg total) by mouth daily at 6 PM.    Dispense:  30 tablet    Refill:  4  . glipiZIDE (GLUCOTROL XL) 2.5 MG 24 hr tablet    Sig: Take 1 tablet (2.5 mg total) by mouth daily with breakfast.    Dispense:  30 tablet    Refill:  4  . losartan-hydrochlorothiazide (HYZAAR) 100-25 MG tablet    Sig: Take 1 tablet by mouth daily.    Dispense:  30 tablet    Refill:  4    Meds were reviewed and updated and he was given a list of the medications he is supposed to take.  I  changed his Cozaar to Hyzaar as it has not been maximized and in the past he was taking a diuretic.  Would like him to f/u for his BP in the next 1-2 months or at the ER should he have a reading over 200 or feel chest pain/SOB.  He is satisfied with this plan.   Follow-up: Return in about 2 months (around 10/06/2019).    Glyn Ade, NP

## 2019-08-07 LAB — CMP14+EGFR
ALT: 22 IU/L (ref 0–44)
AST: 23 IU/L (ref 0–40)
Albumin/Globulin Ratio: 1.7 (ref 1.2–2.2)
Albumin: 4.2 g/dL (ref 3.8–4.8)
Alkaline Phosphatase: 86 IU/L (ref 39–117)
BUN/Creatinine Ratio: 9 — ABNORMAL LOW (ref 10–24)
BUN: 20 mg/dL (ref 8–27)
Bilirubin Total: 0.5 mg/dL (ref 0.0–1.2)
CO2: 23 mmol/L (ref 20–29)
Calcium: 8.9 mg/dL (ref 8.6–10.2)
Chloride: 101 mmol/L (ref 96–106)
Creatinine, Ser: 2.34 mg/dL — ABNORMAL HIGH (ref 0.76–1.27)
GFR calc Af Amer: 33 mL/min/{1.73_m2} — ABNORMAL LOW (ref >59)
GFR calc non Af Amer: 29 mL/min/{1.73_m2} — ABNORMAL LOW (ref >59)
Globulin, Total: 2.5 g/dL (ref 1.5–4.5)
Glucose: 107 mg/dL — ABNORMAL HIGH (ref 65–99)
Potassium: 4.7 mmol/L (ref 3.5–5.2)
Sodium: 136 mmol/L (ref 134–144)
Total Protein: 6.7 g/dL (ref 6.0–8.5)

## 2019-08-07 LAB — RPR: RPR Ser Ql: NONREACTIVE

## 2019-08-07 LAB — TSH: TSH: 1.16 u[IU]/mL (ref 0.450–4.500)

## 2019-08-07 LAB — HEMOGLOBIN A1C
Est. average glucose Bld gHb Est-mCnc: 157 mg/dL
Hgb A1c MFr Bld: 7.1 % — ABNORMAL HIGH (ref 4.8–5.6)

## 2019-08-07 LAB — HIV ANTIBODY (ROUTINE TESTING W REFLEX): HIV Screen 4th Generation wRfx: NONREACTIVE

## 2019-08-07 LAB — VITAMIN D 25 HYDROXY (VIT D DEFICIENCY, FRACTURES): Vit D, 25-Hydroxy: 14.2 ng/mL — ABNORMAL LOW (ref 30.0–100.0)

## 2019-08-08 LAB — URINE CYTOLOGY ANCILLARY ONLY
Chlamydia: NEGATIVE
Comment: NEGATIVE
Comment: NEGATIVE
Comment: NORMAL
Neisseria Gonorrhea: NEGATIVE
Trichomonas: NEGATIVE

## 2019-08-09 ENCOUNTER — Telehealth: Payer: Self-pay | Admitting: Adult Health Nurse Practitioner

## 2019-08-09 ENCOUNTER — Encounter: Payer: Self-pay | Admitting: Adult Health Nurse Practitioner

## 2019-08-09 DIAGNOSIS — E559 Vitamin D deficiency, unspecified: Secondary | ICD-10-CM

## 2019-08-09 DIAGNOSIS — E119 Type 2 diabetes mellitus without complications: Secondary | ICD-10-CM

## 2019-08-09 DIAGNOSIS — N19 Unspecified kidney failure: Secondary | ICD-10-CM

## 2019-08-09 DIAGNOSIS — I10 Essential (primary) hypertension: Secondary | ICD-10-CM

## 2019-08-09 HISTORY — DX: Vitamin D deficiency, unspecified: E55.9

## 2019-08-09 MED ORDER — VITAMIN D (ERGOCALCIFEROL) 1.25 MG (50000 UNIT) PO CAPS: 50000.0000 [IU] | ORAL_CAPSULE | ORAL | 0 refills | Status: AC

## 2019-08-09 NOTE — Telephone Encounter (Signed)
Pt walked in asking for lab results. Advised pt that a staff member will give him a call back once results have been reviewed by provider

## 2019-08-09 NOTE — Telephone Encounter (Signed)
Reviewed labs with patient.  Will start on 50,000units of Vitamin D x 3 months with referral to Nephrology for renal function.  He is inline with this plan.

## 2019-08-09 NOTE — Progress Notes (Signed)
Reviewed labs over the telephone with the patient.  His creatinine is elevated more than 11 months ago.  Discussed blood pressures which are 170s consistently systolic.  Recommended refer to Nephrology which the patient is okjay with.  Will treat his Vitamin D with 50,000Units weekly.  He is inline with this plan.

## 2019-10-07 ENCOUNTER — Ambulatory Visit: Payer: Self-pay | Admitting: Emergency Medicine

## 2019-10-14 ENCOUNTER — Other Ambulatory Visit: Payer: Self-pay

## 2019-10-14 ENCOUNTER — Encounter: Payer: Self-pay | Admitting: Emergency Medicine

## 2019-10-14 ENCOUNTER — Ambulatory Visit (INDEPENDENT_AMBULATORY_CARE_PROVIDER_SITE_OTHER): Payer: Self-pay | Admitting: Emergency Medicine

## 2019-10-14 VITALS — BP 170/80 | HR 76 | Temp 98.3°F | Ht 69.0 in | Wt 189.0 lb

## 2019-10-14 DIAGNOSIS — N1832 Chronic kidney disease, stage 3b: Secondary | ICD-10-CM | POA: Insufficient documentation

## 2019-10-14 DIAGNOSIS — E785 Hyperlipidemia, unspecified: Secondary | ICD-10-CM

## 2019-10-14 DIAGNOSIS — I129 Hypertensive chronic kidney disease with stage 1 through stage 4 chronic kidney disease, or unspecified chronic kidney disease: Secondary | ICD-10-CM

## 2019-10-14 DIAGNOSIS — E1122 Type 2 diabetes mellitus with diabetic chronic kidney disease: Secondary | ICD-10-CM

## 2019-10-14 MED ORDER — ATORVASTATIN CALCIUM 40 MG PO TABS: 40.0000 mg | ORAL_TABLET | Freq: Every day | ORAL | 3 refills | Status: DC

## 2019-10-14 NOTE — Progress Notes (Signed)
BP Readings from Last 3 Encounters:  10/14/19 (!) 186/82  08/06/19 (!) 170/88  12/06/18 (!) 162/100   Lab Results  Component Value Date   HGBA1C 7.1 (H) 08/06/2019   Lab Results  Component Value Date   CREATININE 2.34 (H) 08/06/2019   BUN 20 08/06/2019   NA 136 08/06/2019   K 4.7 08/06/2019   CL 101 08/06/2019   CO2 23 08/06/2019   Ruben Camacho. 63 y.o.   Chief Complaint  Patient presents with  . Hypertension    2 m f/u     HISTORY OF PRESENT ILLNESS: This is a 63 y.o. male here for follow-up of several chronic medical conditions: 1.  Hypertension: Presently taking amlodipine 10 mg, Hyzaar 100-25 mg, and Procardia XL 90 mg daily. 2.  Diabetes type 2: Presently taking Glucotrol XL 2.5 mg daily.  Blood sugars at home in the 120s. 3.  Chronic kidney disease.  Was referred to nephrologist last October but has not scheduled appointment yet due to lack of insurance. 4.  Dyslipidemia: Used to be on atorvastatin but not taking any medications now. 5.  Vitamin D deficiency: Recently started on high-dose replacement therapy. Has no complaints or medical concerns today. Blood work results from 08/06/2019 reviewed with patient.   HPI   Prior to Admission medications   Medication Sig Start Date End Date Taking? Authorizing Provider  amLODipine (NORVASC) 10 MG tablet Take 1 tablet (10 mg total) by mouth daily. 08/06/19  Yes Wendall Mola, NP  aspirin 81 MG tablet Take 81 mg by mouth daily.   Yes [provider]  Blood Glucose Monitoring Suppl (GLUCOCOM BLOOD GLUCOSE MONITOR) DEVI by Does not apply route. 06/14/19 06/13/20 Yes [provider]  glipiZIDE (GLUCOTROL XL) 2.5 MG 24 hr tablet Take 1 tablet (2.5 mg total) by mouth daily with breakfast. 08/06/19 10/14/19 Yes Wendall Mola, NP  glucose blood (PRECISION QID TEST) test strip Use as directed to check blood sugar 2 times a day before meals. 06/14/19  Yes [provider]  Lancet Devices  (SIMPLE DIAGNOSTICS LANCING DEV) MISC Use as directed to check blood sugar 2 times a day before meals. 06/14/19  Yes [provider]  Lancets 30G MISC Use as directed to check blood sugar 2 times a day before meals. 06/14/19  Yes [provider]  losartan-hydrochlorothiazide (HYZAAR) 100-25 MG tablet Take 1 tablet by mouth daily. 08/06/19 10/14/19 Yes Wendall Mola, NP  NIFEdipine (PROCARDIA XL/NIFEDICAL-XL) 90 MG 24 hr tablet Take by mouth. 07/25/19  Yes [provider]  Vitamin D, Ergocalciferol, (DRISDOL) 1.25 MG (50000 UT) CAPS capsule Take 1 capsule (50,000 Units total) by mouth every 7 (seven) days for 12 doses. 08/09/19 10/26/19 Yes Wendall Mola, NP  atorvastatin (LIPITOR) 40 MG tablet Take 1 tablet (40 mg total) by mouth daily at 6 PM. Patient not taking: Reported on 10/14/2019 08/06/19   Wendall Mola, NP  diclofenac (VOLTAREN) 75 MG EC tablet TAKE 1 TABLET(75 MG) BY MOUTH TWICE DAILY Patient not taking: Reported on 10/14/2019 01/08/19   Horald Pollen, MD    No Known Allergies  Patient Active Problem List   Diagnosis Date Noted  . Stage 3b chronic kidney disease 10/14/2019  . Vitamin D deficiency 08/09/2019  . Type 2 diabetes mellitus without complication, without long-term current use of insulin ([place]) 07/25/2019  . AKI (acute kidney injury) ([place]) 06/11/2019  . Sprain of left ankle 11/09/2016  . Intractable migraine without aura and without  status migrainosus 01/08/2015  . Hypertension associated with chronic kidney disease due to type 2 diabetes mellitus ([place]) 11/30/2011  . Hyperlipemia 11/30/2011  . Gout 11/30/2011  . Beta thalassemia ([place]) 11/30/2011    Past Medical History:  Diagnosis Date  . Gout    1-2 exacerbations per year.  Takes Advil PRN for pain.  Marland Kitchen Hyperlipidemia   . Hypertension   . Migraine   . Stroke ([place]) 07/01/2012   L sided weakness, slurred speech.  Admission [facility].    . Vitamin D deficiency  08/09/2019    Past Surgical History:  Procedure Laterality Date  . Admission  07/01/2012   CVA (L sided weakness, slurred speech).  [facility].  . COLONOSCOPY  10/31/2008   no polyps.  Repeat in 10 years.  [place].  Marland Kitchen HERNIA REPAIR      Social History   Socioeconomic History  . Marital status: Married    Spouse name: Not on file  . Number of children: 2  . Years of education: Masters  . Highest education level: Not on file  Occupational History  . Occupation: PRINCIPAL    Employer: [employer]  Tobacco Use  . Smoking status: Never Smoker  . Smokeless tobacco: Never Used  Substance and Sexual Activity  . Alcohol use: No    Alcohol/week: 0.0 standard drinks    Comment: former drinker  . Drug use: No  . Sexual activity: Yes    Partners: Female  Other Topics Concern  . Not on file  Social History Narrative   Marital status: married      CHildren;  2 children; no grandchildren.      Lives at home alone.      Employment:  Pharmacist, hospital; principal in past.      Tobacco: none      Alcohol: weekends      Exercise:  Walking; weightlifting; basketball.   Right handed.   2 cups caffeine/day.   Social Determinants of Health   Financial Resource Strain:   . Difficulty of Paying Living Expenses: Not on file  Food Insecurity:   . Worried About Charity fundraiser in the Last Year: Not on file  . Ran Out of Food in the Last Year: Not on file  Transportation Needs:   . Lack of Transportation (Medical): Not on file  . Lack of Transportation (Non-Medical): Not on file  Physical Activity:   . Days of Exercise per Week: Not on file  . Minutes of Exercise per Session: Not on file  Stress:   . Feeling of Stress : Not on file  Social Connections:   . Frequency of Communication with Friends and Family: Not on file  . Frequency of Social Gatherings with Friends and Family: Not on file  . Attends Religious Services: Not on file  . Active Member of Clubs or Organizations: Not on file  .  Attends Archivist Meetings: Not on file  . Marital Status: Not on file  Intimate Partner Violence:   . Fear of Current or Ex-Partner: Not on file  . Emotionally Abused: Not on file  . Physically Abused: Not on file  . Sexually Abused: Not on file    Family History  Problem Relation Age of Onset  . Stroke Mother   . Hypertension Mother   . Hypertension Daughter   . Cancer Maternal Grandmother        lung  . Healthy Father        Died in motorcycle  accident     Review of Systems  Constitutional: Negative.  Negative for chills and fever.  HENT: Negative.  Negative for congestion and sore throat.   Respiratory: Negative.  Negative for cough and shortness of breath.   Cardiovascular: Negative.  Negative for chest pain and palpitations.  Gastrointestinal: Negative.  Negative for abdominal pain, diarrhea, nausea and vomiting.  Genitourinary: Negative.  Negative for dysuria and hematuria.  Musculoskeletal: Negative.  Negative for myalgias.  Skin: Negative.  Negative for rash.  Neurological: Negative for dizziness and headaches.  All other systems reviewed and are negative.  Today's Vitals   10/14/19 0934 10/14/19 0945  BP: (!) 186/82 (!) 170/80  Pulse: 76   Temp: 98.3 F (36.8 C)   TempSrc: Temporal   SpO2: 96%   Weight: 189 lb (85.7 kg)   Height: 5\' 9"  (1.753 m)    Body mass index is 27.91 kg/m.   Physical Exam Vitals reviewed.  Constitutional:      Appearance: Normal appearance.  HENT:     Head: Normocephalic.  Eyes:     Extraocular Movements: Extraocular movements intact.     Conjunctiva/sclera: Conjunctivae normal.     Pupils: Pupils are equal, round, and reactive to light.  Cardiovascular:     Rate and Rhythm: Normal rate and regular rhythm.     Pulses: Normal pulses.     Heart sounds: Normal heart sounds.  Pulmonary:     Effort: Pulmonary effort is normal.  Abdominal:     Palpations: Abdomen is soft.     Tenderness: There is no abdominal  tenderness.  Musculoskeletal:        General: Normal range of motion.  Skin:    General: Skin is warm and dry.     Capillary Refill: Capillary refill takes less than 2 seconds.  Neurological:     General: No focal deficit present.     Mental Status: He is alert and oriented to person, place, and time.  Psychiatric:        Mood and Affect: Mood normal.        Behavior: Behavior normal.      ASSESSMENT & PLAN: Hypertension associated with chronic kidney disease due to type 2 diabetes mellitus ([place]) 1.  Hypertension: Elevated systolic blood pressure, still not well controlled.  Continue amlodipine 10 mg daily, Hyzaar 100-25 mg, and Procardia XL 90 mg daily.  Advised to monitor blood pressure at home and see if numbers are persistently elevated. 2.  Diabetes type 2.  Last A1c 2 months ago at 7.1.  Home blood sugars 120s.  Advised to increase glipizide XL to 5 mg daily.  Diet and nutrition discussed with patient. 3.  Chronic kidney disease stage IIIb.  Was referred to nephrologist but presently patient has no insurance and prefers to wait until he does 2 months from now. 4.  Dyslipidemia: Restart atorvastatin 40 mg daily. Follow-up in 3 months.  Ruben Camacho was seen today for hypertension.  Diagnoses and all orders for this visit:  Hypertension associated with chronic kidney disease due to type 2 diabetes mellitus ([place]) -     atorvastatin (LIPITOR) 40 MG tablet; Take 1 tablet (40 mg total) by mouth daily at 6 PM.  Stage 3b chronic kidney disease  Dyslipidemia    Patient Instructions    Increase glipizide to 2 tablets (5 mg) daily. Continue all other medications.  Follow-up with nephrologist as referred during last visit. Follow-up in 3 months.     If you have lab  work done today you will be contacted with your lab results within the next 2 weeks.  If you have not heard from [place] then please contact us. The fastest way to get your results is to register for My Chart.   IF you  received an x-ray today, you will receive an invoice from [facility]. Please contact [facility] at [phone] with questions or concerns regarding your invoice.   IF you received labwork today, you will receive an invoice from [place]. Please contact [facility] at [phone] with questions or concerns regarding your invoice.   Our billing staff will not be able to assist you with questions regarding bills from these companies.  You will be contacted with the lab results as soon as they are available. The fastest way to get your results is to activate your My Chart account. Instructions are located on the last page of this paperwork. If you have not heard from [place] regarding the results in 2 weeks, please contact this office.      Diabetes Mellitus and Nutrition, Adult When you have diabetes (diabetes mellitus), it is very important to have healthy eating habits because your blood sugar (glucose) levels are greatly affected by what you eat and drink. Eating healthy foods in the appropriate amounts, at about the same times every day, can help you:  Control your blood glucose.  Lower your risk of heart disease.  Improve your blood pressure.  Reach or maintain a healthy weight. Every person with diabetes is different, and each person has different needs for a meal plan. Your health care provider may recommend that you work with a diet and nutrition specialist (dietitian) to make a meal plan that is best for you. Your meal plan may vary depending on factors such as:  The calories you need.  The medicines you take.  Your weight.  Your blood glucose, blood pressure, and cholesterol levels.  Your activity level.  Other health conditions you have, such as heart or kidney disease. How do carbohydrates affect me? Carbohydrates, also called carbs, affect your blood glucose level more than any other type of food. Eating carbs naturally raises the amount of glucose in your  blood. Carb counting is a method for keeping track of how many carbs you eat. Counting carbs is important to keep your blood glucose at a healthy level, especially if you use insulin or take certain oral diabetes medicines. It is important to know how many carbs you can safely have in each meal. This is different for every person. Your dietitian can help you calculate how many carbs you should have at each meal and for each snack. Foods that contain carbs include:  Bread, cereal, rice, pasta, and crackers.  Potatoes and corn.  Peas, beans, and lentils.  Milk and yogurt.  Fruit and juice.  Desserts, such as cakes, cookies, ice cream, and candy. How does alcohol affect me? Alcohol can cause a sudden decrease in blood glucose (hypoglycemia), especially if you use insulin or take certain oral diabetes medicines. Hypoglycemia can be a life-threatening condition. Symptoms of hypoglycemia (sleepiness, dizziness, and confusion) are similar to symptoms of having too much alcohol. If your health care provider says that alcohol is safe for you, follow these guidelines:  Limit alcohol intake to no more than 1 drink per day for nonpregnant women and 2 drinks per day for men. One drink equals 12 oz of beer, 5 oz of wine, or 1 oz of hard liquor.  Do not drink on  an empty stomach.  Keep yourself hydrated with water, diet soda, or unsweetened iced tea.  Keep in mind that regular soda, juice, and other mixers may contain a lot of sugar and must be counted as carbs. What are tips for following this plan?  Reading food labels  Start by checking the serving size on the "Nutrition Facts" label of packaged foods and drinks. The amount of calories, carbs, fats, and other nutrients listed on the label is based on one serving of the item. Many items contain more than one serving per package.  Check the total grams (g) of carbs in one serving. You can calculate the number of servings of carbs in one serving by  dividing the total carbs by 15. For example, if a food has 30 g of total carbs, it would be equal to 2 servings of carbs.  Check the number of grams (g) of saturated and trans fats in one serving. Choose foods that have low or no amount of these fats.  Check the number of milligrams (mg) of salt (sodium) in one serving. Most people should limit total sodium intake to less than 2,300 mg per day.  Always check the nutrition information of foods labeled as "low-fat" or "nonfat". These foods may be higher in added sugar or refined carbs and should be avoided.  Talk to your dietitian to identify your daily goals for nutrients listed on the label. Shopping  Avoid buying canned, premade, or processed foods. These foods tend to be high in fat, sodium, and added sugar.  Shop around the outside edge of the grocery store. This includes fresh fruits and vegetables, bulk grains, fresh meats, and fresh dairy. Cooking  Use low-heat cooking methods, such as baking, instead of high-heat cooking methods like deep frying.  Cook using healthy oils, such as olive, canola, or sunflower oil.  Avoid cooking with butter, cream, or high-fat meats. Meal planning  Eat meals and snacks regularly, preferably at the same times every day. Avoid going long periods of time without eating.  Eat foods high in fiber, such as fresh fruits, vegetables, beans, and whole grains. Talk to your dietitian about how many servings of carbs you can eat at each meal.  Eat 4-6 ounces (oz) of lean protein each day, such as lean meat, chicken, fish, eggs, or tofu. One oz of lean protein is equal to: ? 1 oz of meat, chicken, or fish. ? 1 egg. ?  cup of tofu.  Eat some foods each day that contain healthy fats, such as avocado, nuts, seeds, and fish. Lifestyle  Check your blood glucose regularly.  Exercise regularly as told by your health care provider. This may include: ? 150 minutes of moderate-intensity or vigorous-intensity  exercise each week. This could be brisk walking, biking, or water aerobics. ? Stretching and doing strength exercises, such as yoga or weightlifting, at least 2 times a week.  Take medicines as told by your health care provider.  Do not use any products that contain nicotine or tobacco, such as cigarettes and e-cigarettes. If you need help quitting, ask your health care provider.  Work with a Social worker or diabetes educator to identify strategies to manage stress and any emotional and social challenges. Questions to ask a health care provider  Do I need to meet with a diabetes educator?  Do I need to meet with a dietitian?  What number can I call if I have questions?  When are the best times to check my blood glucose?  Where to find more information:  American Diabetes Association: diabetes.org  Academy of Nutrition and Dietetics: www.eatright.[employer] of Diabetes and Digestive and Kidney Diseases (NIH): [employer] Summary  A healthy meal plan will help you control your blood glucose and maintain a healthy lifestyle.  Working with a diet and nutrition specialist (dietitian) can help you make a meal plan that is best for you.  Keep in mind that carbohydrates (carbs) and alcohol have immediate effects on your blood glucose levels. It is important to count carbs and to use alcohol carefully. This information is not intended to replace advice given to you by your health care provider. Make sure you discuss any questions you have with your health care provider. Document Released: 07/14/2005 Document Revised: 09/29/2017 Document Reviewed: 11/21/2016 Elsevier Patient Education  2020 [employer].  Hypertension, Adult High blood pressure (hypertension) is when the force of blood pumping through the arteries is too strong. The arteries are the blood vessels that carry blood from the heart throughout the body. Hypertension forces the heart to work harder to pump blood  and may cause arteries to become narrow or stiff. Untreated or uncontrolled hypertension can cause a heart attack, heart failure, a stroke, kidney disease, and other problems. A blood pressure reading consists of a higher number over a lower number. Ideally, your blood pressure should be below 120/80. The first ("top") number is called the systolic pressure. It is a measure of the pressure in your arteries as your heart beats. The second ("bottom") number is called the diastolic pressure. It is a measure of the pressure in your arteries as the heart relaxes. What are the causes? The exact cause of this condition is not known. There are some conditions that result in or are related to high blood pressure. What increases the risk? Some risk factors for high blood pressure are under your control. The following factors may make you more likely to develop this condition:  Smoking.  Having type 2 diabetes mellitus, high cholesterol, or both.  Not getting enough exercise or physical activity.  Being overweight.  Having too much fat, sugar, calories, or salt (sodium) in your diet.  Drinking too much alcohol. Some risk factors for high blood pressure may be difficult or impossible to change. Some of these factors include:  Having chronic kidney disease.  Having a family history of high blood pressure.  Age. Risk increases with age.  Race. You may be at higher risk if you are African American.  Gender. Men are at higher risk than women before age 43. After age 54, women are at higher risk than men.  Having obstructive sleep apnea.  Stress. What are the signs or symptoms? High blood pressure may not cause symptoms. Very high blood pressure (hypertensive crisis) may cause:  Headache.  Anxiety.  Shortness of breath.  Nosebleed.  Nausea and vomiting.  Vision changes.  Severe chest pain.  Seizures. How is this diagnosed? This condition is diagnosed by measuring your blood  pressure while you are seated, with your arm resting on a flat surface, your legs uncrossed, and your feet flat on the floor. The cuff of the blood pressure monitor will be placed directly against the skin of your upper arm at the level of your heart. It should be measured at least twice using the same arm. Certain conditions can cause a difference in blood pressure between your right and left arms. Certain factors can cause blood pressure readings to be lower or higher than  normal for a short period of time:  When your blood pressure is higher when you are in a health care provider's office than when you are at home, this is called white coat hypertension. Most people with this condition do not need medicines.  When your blood pressure is higher at home than when you are in a health care provider's office, this is called masked hypertension. Most people with this condition may need medicines to control blood pressure. If you have a high blood pressure reading during one visit or you have normal blood pressure with other risk factors, you may be asked to:  Return on a different day to have your blood pressure checked again.  Monitor your blood pressure at home for 1 week or longer. If you are diagnosed with hypertension, you may have other blood or imaging tests to help your health care provider understand your overall risk for other conditions. How is this treated? This condition is treated by making healthy lifestyle changes, such as eating healthy foods, exercising more, and reducing your alcohol intake. Your health care provider may prescribe medicine if lifestyle changes are not enough to get your blood pressure under control, and if:  Your systolic blood pressure is above 130.  Your diastolic blood pressure is above 80. Your personal target blood pressure may vary depending on your medical conditions, your age, and other factors. Follow these instructions at home: Eating and  drinking   Eat a diet that is high in fiber and potassium, and low in sodium, added sugar, and fat. An example eating plan is called the DASH (Dietary Approaches to Stop Hypertension) diet. To eat this way: ? Eat plenty of fresh fruits and vegetables. Try to fill one half of your plate at each meal with fruits and vegetables. ? Eat whole grains, such as whole-wheat pasta, brown rice, or whole-grain bread. Fill about one fourth of your plate with whole grains. ? Eat or drink low-fat dairy products, such as skim milk or low-fat yogurt. ? Avoid fatty cuts of meat, processed or cured meats, and poultry with skin. Fill about one fourth of your plate with lean proteins, such as fish, chicken without skin, beans, eggs, or tofu. ? Avoid pre-made and processed foods. These tend to be higher in sodium, added sugar, and fat.  Reduce your daily sodium intake. Most people with hypertension should eat less than 1,500 mg of sodium a day.  Do not drink alcohol if: ? Your health care provider tells you not to drink. ? You are pregnant, may be pregnant, or are planning to become pregnant.  If you drink alcohol: ? Limit how much you use to:  0-1 drink a day for women.  0-2 drinks a day for men. ? Be aware of how much alcohol is in your drink. In the [place] one drink equals one 12 oz bottle of beer (355 mL), one 5 oz glass of wine (148 mL), or one 1 oz glass of hard liquor (44 mL). Lifestyle   Work with your health care provider to maintain a healthy body weight or to lose weight. Ask what an ideal weight is for you.  Get at least 30 minutes of exercise most days of the week. Activities may include walking, swimming, or biking.  Include exercise to strengthen your muscles (resistance exercise), such as Pilates or lifting weights, as part of your weekly exercise routine. Try to do these types of exercises for 30 minutes at least 3 days a week.  Do not use any products that contain nicotine or tobacco,  such as cigarettes, e-cigarettes, and chewing tobacco. If you need help quitting, ask your health care provider.  Monitor your blood pressure at home as told by your health care provider.  Keep all follow-up visits as told by your health care provider. This is important. Medicines  Take over-the-counter and prescription medicines only as told by your health care provider. Follow directions carefully. Blood pressure medicines must be taken as prescribed.  Do not skip doses of blood pressure medicine. Doing this puts you at risk for problems and can make the medicine less effective.  Ask your health care provider about side effects or reactions to medicines that you should watch for. Contact a health care provider if you:  Think you are having a reaction to a medicine you are taking.  Have headaches that keep coming back (recurring).  Feel dizzy.  Have swelling in your ankles.  Have trouble with your vision. Get help right away if you:  Develop a severe headache or confusion.  Have unusual weakness or numbness.  Feel faint.  Have severe pain in your chest or abdomen.  Vomit repeatedly.  Have trouble breathing. Summary  Hypertension is when the force of blood pumping through your arteries is too strong. If this condition is not controlled, it may put you at risk for serious complications.  Your personal target blood pressure may vary depending on your medical conditions, your age, and other factors. For most people, a normal blood pressure is less than 120/80.  Hypertension is treated with lifestyle changes, medicines, or a combination of both. Lifestyle changes include losing weight, eating a healthy, low-sodium diet, exercising more, and limiting alcohol. This information is not intended to replace advice given to you by your health care provider. Make sure you discuss any questions you have with your health care provider. Document Released: 10/17/2005 Document Revised:  06/27/2018 Document Reviewed: 06/27/2018 Elsevier Patient Education  2020 [place].  Chronic Kidney Disease, Adult Chronic kidney disease (CKD) happens when the kidneys are damaged over a long period of time. The kidneys are two organs that help with:  Getting rid of waste and extra fluid from the blood.  Making hormones that maintain the amount of fluid in your tissues and blood vessels.  Making sure that the body has the right amount of fluids and chemicals. Most of the time, CKD does not go away, but it can usually be controlled. Steps must be taken to slow down the kidney damage or to stop it from getting worse. If this is not done, the kidneys may stop working. Follow these instructions at home: Medicines  Take over-the-counter and prescription medicines only as told by your doctor. You may need to change the amount of medicines you take.  Do not take any new medicines unless your doctor says it is okay. Many medicines can make your kidney damage worse.  Do not take any vitamin and supplements unless your doctor says it is okay. Many vitamins and supplements can make your kidney damage worse. General instructions  Follow a diet as told by your doctor. You may need to stay away from: ? Alcohol. ? Salty foods. ? Foods that are high in:  Potassium.  Calcium.  Protein.  Do not use any products that contain nicotine or tobacco, such as cigarettes and e-cigarettes. If you need help quitting, ask your doctor.  Keep track of your blood pressure at home. Tell your doctor about any  changes.  If you have diabetes, keep track of your blood sugar as told by your doctor.  Try to stay at a healthy weight. If you need help, ask your doctor.  Exercise at least 30 minutes a day, 5 days a week.  Stay up-to-date with your shots (immunizations) as told by your doctor.  Keep all follow-up visits as told by your doctor. This is important. Contact a doctor if:  Your symptoms get  worse.  You have new symptoms. Get help right away if:  You have symptoms of end-stage kidney disease. These may include: ? Headaches. ? Numbness in your hands or feet. ? Easy bruising. ? Having hiccups often. ? Chest pain. ? Shortness of breath. ? Stopping of menstrual periods in women.  You have a fever.  You have very little pee (urine).  You have pain or bleeding when you pee. Summary  Chronic kidney disease (CKD) happens when the kidneys are damaged over a long period of time.  Most of the time, this condition does not go away, but it can usually be controlled. Steps must be taken to slow down the kidney damage or to stop it from getting worse.  Treatment may include a combination of medicines and lifestyle changes. This information is not intended to replace advice given to you by your health care provider. Make sure you discuss any questions you have with your health care provider. Document Released: 01/11/2010 Document Revised: 09/29/2017 Document Reviewed: 11/21/2016 Elsevier Patient Education  2020 Elsevier Inc.      Agustina Caroli, MD [facility]

## 2019-10-14 NOTE — Assessment & Plan Note (Addendum)
1.  Hypertension: Elevated systolic blood pressure, still not well controlled.  Continue amlodipine 10 mg daily, Hyzaar 100-25 mg, and Procardia XL 90 mg daily.  Advised to monitor blood pressure at home and see if numbers are persistently elevated. 2.  Diabetes type 2.  Last A1c 2 months ago at 7.1.  Home blood sugars 120s.  Advised to increase glipizide XL to 5 mg daily.  Diet and nutrition discussed with patient. 3.  Chronic kidney disease stage IIIb.  Was referred to nephrologist but presently patient has no insurance and prefers to wait until he does 2 months from now. 4.  Dyslipidemia: Restart atorvastatin 40 mg daily. Follow-up in 3 months.

## 2019-10-14 NOTE — Patient Instructions (Addendum)
Increase glipizide to 2 tablets (5 mg) daily. Continue all other medications.  Follow-up with nephrologist as referred during last visit. Follow-up in 3 months.     If you have lab work done today you will be contacted with your lab results within the next 2 weeks.  If you have not heard from [place] then please contact us. The fastest way to get your results is to register for My Chart.   IF you received an x-ray today, you will receive an invoice from [facility]. Please contact [facility] at [phone] with questions or concerns regarding your invoice.   IF you received labwork today, you will receive an invoice from [facility]. Please contact [facility] at [phone] with questions or concerns regarding your invoice.   Our billing staff will not be able to assist you with questions regarding bills from these companies.  You will be contacted with the lab results as soon as they are available. The fastest way to get your results is to activate your My Chart account. Instructions are located on the last page of this paperwork. If you have not heard from [place] regarding the results in 2 weeks, please contact this office.      Diabetes Mellitus and Nutrition, Adult When you have diabetes (diabetes mellitus), it is very important to have healthy eating habits because your blood sugar (glucose) levels are greatly affected by what you eat and drink. Eating healthy foods in the appropriate amounts, at about the same times every day, can help you:  Control your blood glucose.  Lower your risk of heart disease.  Improve your blood pressure.  Reach or maintain a healthy weight. Every person with diabetes is different, and each person has different needs for a meal plan. Your health care provider may recommend that you work with a diet and nutrition specialist (dietitian) to make a meal plan that is best for you. Your meal plan may vary depending on factors such as:  The calories  you need.  The medicines you take.  Your weight.  Your blood glucose, blood pressure, and cholesterol levels.  Your activity level.  Other health conditions you have, such as heart or kidney disease. How do carbohydrates affect me? Carbohydrates, also called carbs, affect your blood glucose level more than any other type of food. Eating carbs naturally raises the amount of glucose in your blood. Carb counting is a method for keeping track of how many carbs you eat. Counting carbs is important to keep your blood glucose at a healthy level, especially if you use insulin or take certain oral diabetes medicines. It is important to know how many carbs you can safely have in each meal. This is different for every person. Your dietitian can help you calculate how many carbs you should have at each meal and for each snack. Foods that contain carbs include:  Bread, cereal, rice, pasta, and crackers.  Potatoes and corn.  Peas, beans, and lentils.  Milk and yogurt.  Fruit and juice.  Desserts, such as cakes, cookies, ice cream, and candy. How does alcohol affect me? Alcohol can cause a sudden decrease in blood glucose (hypoglycemia), especially if you use insulin or take certain oral diabetes medicines. Hypoglycemia can be a life-threatening condition. Symptoms of hypoglycemia (sleepiness, dizziness, and confusion) are similar to symptoms of having too much alcohol. If your health care provider says that alcohol is safe for you, follow these guidelines:  Limit alcohol intake to no more than 1 drink per day  for nonpregnant women and 2 drinks per day for men. One drink equals 12 oz of beer, 5 oz of wine, or 1 oz of hard liquor.  Do not drink on an empty stomach.  Keep yourself hydrated with water, diet soda, or unsweetened iced tea.  Keep in mind that regular soda, juice, and other mixers may contain a lot of sugar and must be counted as carbs. What are tips for following this  plan?  Reading food labels  Start by checking the serving size on the "Nutrition Facts" label of packaged foods and drinks. The amount of calories, carbs, fats, and other nutrients listed on the label is based on one serving of the item. Many items contain more than one serving per package.  Check the total grams (g) of carbs in one serving. You can calculate the number of servings of carbs in one serving by dividing the total carbs by 15. For example, if a food has 30 g of total carbs, it would be equal to 2 servings of carbs.  Check the number of grams (g) of saturated and trans fats in one serving. Choose foods that have low or no amount of these fats.  Check the number of milligrams (mg) of salt (sodium) in one serving. Most people should limit total sodium intake to less than 2,300 mg per day.  Always check the nutrition information of foods labeled as "low-fat" or "nonfat". These foods may be higher in added sugar or refined carbs and should be avoided.  Talk to your dietitian to identify your daily goals for nutrients listed on the label. Shopping  Avoid buying canned, premade, or processed foods. These foods tend to be high in fat, sodium, and added sugar.  Shop around the outside edge of the grocery store. This includes fresh fruits and vegetables, bulk grains, fresh meats, and fresh dairy. Cooking  Use low-heat cooking methods, such as baking, instead of high-heat cooking methods like deep frying.  Cook using healthy oils, such as olive, canola, or sunflower oil.  Avoid cooking with butter, cream, or high-fat meats. Meal planning  Eat meals and snacks regularly, preferably at the same times every day. Avoid going long periods of time without eating.  Eat foods high in fiber, such as fresh fruits, vegetables, beans, and whole grains. Talk to your dietitian about how many servings of carbs you can eat at each meal.  Eat 4-6 ounces (oz) of lean protein each day, such as lean  meat, chicken, fish, eggs, or tofu. One oz of lean protein is equal to: ? 1 oz of meat, chicken, or fish. ? 1 egg. ?  cup of tofu.  Eat some foods each day that contain healthy fats, such as avocado, nuts, seeds, and fish. Lifestyle  Check your blood glucose regularly.  Exercise regularly as told by your health care provider. This may include: ? 150 minutes of moderate-intensity or vigorous-intensity exercise each week. This could be brisk walking, biking, or water aerobics. ? Stretching and doing strength exercises, such as yoga or weightlifting, at least 2 times a week.  Take medicines as told by your health care provider.  Do not use any products that contain nicotine or tobacco, such as cigarettes and e-cigarettes. If you need help quitting, ask your health care provider.  Work with a Social worker or diabetes educator to identify strategies to manage stress and any emotional and social challenges. Questions to ask a health care provider  Do I need to meet with a  diabetes educator?  Do I need to meet with a dietitian?  What number can I call if I have questions?  When are the best times to check my blood glucose? Where to find more information:  American Diabetes Association: diabetes.org  Academy of Nutrition and Dietetics: www.eatright.[employer] of Diabetes and Digestive and Kidney Diseases (NIH): [employer] Summary  A healthy meal plan will help you control your blood glucose and maintain a healthy lifestyle.  Working with a diet and nutrition specialist (dietitian) can help you make a meal plan that is best for you.  Keep in mind that carbohydrates (carbs) and alcohol have immediate effects on your blood glucose levels. It is important to count carbs and to use alcohol carefully. This information is not intended to replace advice given to you by your health care provider. Make sure you discuss any questions you have with your health care  provider. Document Released: 07/14/2005 Document Revised: 09/29/2017 Document Reviewed: 11/21/2016 Elsevier Patient Education  2020 [employer].  Hypertension, Adult High blood pressure (hypertension) is when the force of blood pumping through the arteries is too strong. The arteries are the blood vessels that carry blood from the heart throughout the body. Hypertension forces the heart to work harder to pump blood and may cause arteries to become narrow or stiff. Untreated or uncontrolled hypertension can cause a heart attack, heart failure, a stroke, kidney disease, and other problems. A blood pressure reading consists of a higher number over a lower number. Ideally, your blood pressure should be below 120/80. The first ("top") number is called the systolic pressure. It is a measure of the pressure in your arteries as your heart beats. The second ("bottom") number is called the diastolic pressure. It is a measure of the pressure in your arteries as the heart relaxes. What are the causes? The exact cause of this condition is not known. There are some conditions that result in or are related to high blood pressure. What increases the risk? Some risk factors for high blood pressure are under your control. The following factors may make you more likely to develop this condition:  Smoking.  Having type 2 diabetes mellitus, high cholesterol, or both.  Not getting enough exercise or physical activity.  Being overweight.  Having too much fat, sugar, calories, or salt (sodium) in your diet.  Drinking too much alcohol. Some risk factors for high blood pressure may be difficult or impossible to change. Some of these factors include:  Having chronic kidney disease.  Having a family history of high blood pressure.  Age. Risk increases with age.  Race. You may be at higher risk if you are African American.  Gender. Men are at higher risk than women before age 54. After age 60, women are at  higher risk than men.  Having obstructive sleep apnea.  Stress. What are the signs or symptoms? High blood pressure may not cause symptoms. Very high blood pressure (hypertensive crisis) may cause:  Headache.  Anxiety.  Shortness of breath.  Nosebleed.  Nausea and vomiting.  Vision changes.  Severe chest pain.  Seizures. How is this diagnosed? This condition is diagnosed by measuring your blood pressure while you are seated, with your arm resting on a flat surface, your legs uncrossed, and your feet flat on the floor. The cuff of the blood pressure monitor will be placed directly against the skin of your upper arm at the level of your heart. It should be measured at least twice  using the same arm. Certain conditions can cause a difference in blood pressure between your right and left arms. Certain factors can cause blood pressure readings to be lower or higher than normal for a short period of time:  When your blood pressure is higher when you are in a health care provider's office than when you are at home, this is called white coat hypertension. Most people with this condition do not need medicines.  When your blood pressure is higher at home than when you are in a health care provider's office, this is called masked hypertension. Most people with this condition may need medicines to control blood pressure. If you have a high blood pressure reading during one visit or you have normal blood pressure with other risk factors, you may be asked to:  Return on a different day to have your blood pressure checked again.  Monitor your blood pressure at home for 1 week or longer. If you are diagnosed with hypertension, you may have other blood or imaging tests to help your health care provider understand your overall risk for other conditions. How is this treated? This condition is treated by making healthy lifestyle changes, such as eating healthy foods, exercising more, and reducing  your alcohol intake. Your health care provider may prescribe medicine if lifestyle changes are not enough to get your blood pressure under control, and if:  Your systolic blood pressure is above 130.  Your diastolic blood pressure is above 80. Your personal target blood pressure may vary depending on your medical conditions, your age, and other factors. Follow these instructions at home: Eating and drinking   Eat a diet that is high in fiber and potassium, and low in sodium, added sugar, and fat. An example eating plan is called the DASH (Dietary Approaches to Stop Hypertension) diet. To eat this way: ? Eat plenty of fresh fruits and vegetables. Try to fill one half of your plate at each meal with fruits and vegetables. ? Eat whole grains, such as whole-wheat pasta, brown rice, or whole-grain bread. Fill about one fourth of your plate with whole grains. ? Eat or drink low-fat dairy products, such as skim milk or low-fat yogurt. ? Avoid fatty cuts of meat, processed or cured meats, and poultry with skin. Fill about one fourth of your plate with lean proteins, such as fish, chicken without skin, beans, eggs, or tofu. ? Avoid pre-made and processed foods. These tend to be higher in sodium, added sugar, and fat.  Reduce your daily sodium intake. Most people with hypertension should eat less than 1,500 mg of sodium a day.  Do not drink alcohol if: ? Your health care provider tells you not to drink. ? You are pregnant, may be pregnant, or are planning to become pregnant.  If you drink alcohol: ? Limit how much you use to:  0-1 drink a day for women.  0-2 drinks a day for men. ? Be aware of how much alcohol is in your drink. In the [place] one drink equals one 12 oz bottle of beer (355 mL), one 5 oz glass of wine (148 mL), or one 1 oz glass of hard liquor (44 mL). Lifestyle   Work with your health care provider to maintain a healthy body weight or to lose weight. Ask what an ideal weight is  for you.  Get at least 30 minutes of exercise most days of the week. Activities may include walking, swimming, or biking.  Include exercise to strengthen your  muscles (resistance exercise), such as Pilates or lifting weights, as part of your weekly exercise routine. Try to do these types of exercises for 30 minutes at least 3 days a week.  Do not use any products that contain nicotine or tobacco, such as cigarettes, e-cigarettes, and chewing tobacco. If you need help quitting, ask your health care provider.  Monitor your blood pressure at home as told by your health care provider.  Keep all follow-up visits as told by your health care provider. This is important. Medicines  Take over-the-counter and prescription medicines only as told by your health care provider. Follow directions carefully. Blood pressure medicines must be taken as prescribed.  Do not skip doses of blood pressure medicine. Doing this puts you at risk for problems and can make the medicine less effective.  Ask your health care provider about side effects or reactions to medicines that you should watch for. Contact a health care provider if you:  Think you are having a reaction to a medicine you are taking.  Have headaches that keep coming back (recurring).  Feel dizzy.  Have swelling in your ankles.  Have trouble with your vision. Get help right away if you:  Develop a severe headache or confusion.  Have unusual weakness or numbness.  Feel faint.  Have severe pain in your chest or abdomen.  Vomit repeatedly.  Have trouble breathing. Summary  Hypertension is when the force of blood pumping through your arteries is too strong. If this condition is not controlled, it may put you at risk for serious complications.  Your personal target blood pressure may vary depending on your medical conditions, your age, and other factors. For most people, a normal blood pressure is less than 120/80.  Hypertension is  treated with lifestyle changes, medicines, or a combination of both. Lifestyle changes include losing weight, eating a healthy, low-sodium diet, exercising more, and limiting alcohol. This information is not intended to replace advice given to you by your health care provider. Make sure you discuss any questions you have with your health care provider. Document Released: 10/17/2005 Document Revised: 06/27/2018 Document Reviewed: 06/27/2018 Elsevier Patient Education  2020 [place].  Chronic Kidney Disease, Adult Chronic kidney disease (CKD) happens when the kidneys are damaged over a long period of time. The kidneys are two organs that help with:  Getting rid of waste and extra fluid from the blood.  Making hormones that maintain the amount of fluid in your tissues and blood vessels.  Making sure that the body has the right amount of fluids and chemicals. Most of the time, CKD does not go away, but it can usually be controlled. Steps must be taken to slow down the kidney damage or to stop it from getting worse. If this is not done, the kidneys may stop working. Follow these instructions at home: Medicines  Take over-the-counter and prescription medicines only as told by your doctor. You may need to change the amount of medicines you take.  Do not take any new medicines unless your doctor says it is okay. Many medicines can make your kidney damage worse.  Do not take any vitamin and supplements unless your doctor says it is okay. Many vitamins and supplements can make your kidney damage worse. General instructions  Follow a diet as told by your doctor. You may need to stay away from: ? Alcohol. ? Salty foods. ? Foods that are high in:  Potassium.  Calcium.  Protein.  Do not use any products  that contain nicotine or tobacco, such as cigarettes and e-cigarettes. If you need help quitting, ask your doctor.  Keep track of your blood pressure at home. Tell your doctor about any  changes.  If you have diabetes, keep track of your blood sugar as told by your doctor.  Try to stay at a healthy weight. If you need help, ask your doctor.  Exercise at least 30 minutes a day, 5 days a week.  Stay up-to-date with your shots (immunizations) as told by your doctor.  Keep all follow-up visits as told by your doctor. This is important. Contact a doctor if:  Your symptoms get worse.  You have new symptoms. Get help right away if:  You have symptoms of end-stage kidney disease. These may include: ? Headaches. ? Numbness in your hands or feet. ? Easy bruising. ? Having hiccups often. ? Chest pain. ? Shortness of breath. ? Stopping of menstrual periods in women.  You have a fever.  You have very little pee (urine).  You have pain or bleeding when you pee. Summary  Chronic kidney disease (CKD) happens when the kidneys are damaged over a long period of time.  Most of the time, this condition does not go away, but it can usually be controlled. Steps must be taken to slow down the kidney damage or to stop it from getting worse.  Treatment may include a combination of medicines and lifestyle changes. This information is not intended to replace advice given to you by your health care provider. Make sure you discuss any questions you have with your health care provider. Document Released: 01/11/2010 Document Revised: 09/29/2017 Document Reviewed: 11/21/2016 Elsevier Patient Education  2020 [employer].

## 2019-10-22 ENCOUNTER — Encounter: Payer: Self-pay | Admitting: Adult Health Nurse Practitioner

## 2020-01-04 ENCOUNTER — Ambulatory Visit: Payer: Self-pay | Attending: Internal Medicine

## 2020-01-04 DIAGNOSIS — Z23 Encounter for immunization: Secondary | ICD-10-CM | POA: Insufficient documentation

## 2020-01-04 NOTE — Progress Notes (Signed)
   [id number]  Name:  Ruben Camacho.    MRN: [id number] DOB: [birth year]  01/04/2020  [name] was observed post Covid-19 immunization for 15 minutes without incident. He was provided with Vaccine Information Sheet and instruction to access the V-Safe system.   [name] was instructed to call 911 with any severe reactions post vaccine: Marland Kitchen Difficulty breathing  . Swelling of face and throat  . A fast heartbeat  . A bad rash all over body  . Dizziness and weakness   Immunizations Administered    Name Date Dose VIS Date Route   Pfizer COVID-19 Vaccine 01/04/2020  8:41 AM 0.3 mL 10/11/2019 Intramuscular   Manufacturer: [place]   Lot: [id number]   [place]: [id number]

## 2020-01-06 ENCOUNTER — Ambulatory Visit: Payer: Self-pay | Admitting: Emergency Medicine

## 2020-01-25 ENCOUNTER — Ambulatory Visit: Payer: Self-pay | Attending: Internal Medicine

## 2020-01-25 DIAGNOSIS — Z23 Encounter for immunization: Secondary | ICD-10-CM

## 2020-01-25 NOTE — Progress Notes (Signed)
   [id number]  Name:  Ruben Camacho.    MRN: [id number] DOB: 27-Aug-1956  01/25/2020  Mr. Dombkowski was observed post Covid-19 immunization for 15 minutes without incident. He was provided with Vaccine Information Sheet and instruction to access the V-Safe system.   Mr. Mena was instructed to call 911 with any severe reactions post vaccine: Marland Kitchen Difficulty breathing  . Swelling of face and throat  . A fast heartbeat  . A bad rash all over body  . Dizziness and weakness   Immunizations Administered    Name Date Dose VIS Date Route   Pfizer COVID-19 Vaccine 01/25/2020 10:26 AM 0.3 mL 10/11/2019 Intramuscular   Manufacturer: [place]   Lot: [id number]   [place]: [id number]

## 2020-01-27 ENCOUNTER — Ambulatory Visit: Payer: Self-pay

## 2020-03-10 ENCOUNTER — Other Ambulatory Visit: Payer: Self-pay | Admitting: Emergency Medicine

## 2020-03-10 ENCOUNTER — Other Ambulatory Visit: Payer: Self-pay

## 2020-03-10 DIAGNOSIS — E119 Type 2 diabetes mellitus without complications: Secondary | ICD-10-CM

## 2020-03-10 DIAGNOSIS — E785 Hyperlipidemia, unspecified: Secondary | ICD-10-CM

## 2020-03-10 MED ORDER — LOSARTAN POTASSIUM-HCTZ 100-25 MG PO TABS: 1.0000 | ORAL_TABLET | Freq: Every day | ORAL | 0 refills | Status: DC

## 2020-03-10 MED ORDER — GLIPIZIDE ER 2.5 MG PO TB24: 2.5000 mg | ORAL_TABLET | Freq: Every day | ORAL | 0 refills | Status: DC

## 2020-03-10 NOTE — Telephone Encounter (Signed)
Pt was due to come bk for office visit in January, pt will need to come in for office visit in order to receive 90 supple. Labs are needed. Thirty day supply sent.

## 2020-03-10 NOTE — Telephone Encounter (Signed)
Patient requesting 90 day supply of BP medication- Attempted to call patient to schedule follow up appointment- left message on cell number to call office for appointment. Request sent to office for review

## 2020-04-13 ENCOUNTER — Other Ambulatory Visit: Payer: Self-pay | Admitting: Emergency Medicine

## 2020-04-13 DIAGNOSIS — E785 Hyperlipidemia, unspecified: Secondary | ICD-10-CM

## 2020-05-16 ENCOUNTER — Other Ambulatory Visit: Payer: Self-pay | Admitting: Emergency Medicine

## 2020-05-16 DIAGNOSIS — E785 Hyperlipidemia, unspecified: Secondary | ICD-10-CM

## 2020-05-16 NOTE — Telephone Encounter (Signed)
Requested  medications are  due for refill today yes  Requested medications are on the active medication list yes  Last refill 6/14  Last visit Dec 2020  Future visit scheduled NO  Notes to clinic Failed protocol per no visit within 6 months and no upcoming appt scheduled

## 2020-05-18 NOTE — Telephone Encounter (Signed)
Schedule an office visit for any further refills

## 2020-05-18 NOTE — Telephone Encounter (Signed)
Called pt [facility] please Advice

## 2020-08-10 ENCOUNTER — Emergency Department (HOSPITAL_COMMUNITY)
Admission: EM | Admit: 2020-08-10 | Discharge: 2020-08-10 | Disposition: A | Discharge status: Home / Self Care | Payer: Self-pay | Attending: Emergency Medicine | Admitting: Emergency Medicine

## 2020-08-10 ENCOUNTER — Emergency Department (HOSPITAL_COMMUNITY): Payer: Self-pay

## 2020-08-10 DIAGNOSIS — I10 Essential (primary) hypertension: Secondary | ICD-10-CM

## 2020-08-10 DIAGNOSIS — Z79899 Other long term (current) drug therapy: Secondary | ICD-10-CM | POA: Insufficient documentation

## 2020-08-10 DIAGNOSIS — I129 Hypertensive chronic kidney disease with stage 1 through stage 4 chronic kidney disease, or unspecified chronic kidney disease: Secondary | ICD-10-CM | POA: Insufficient documentation

## 2020-08-10 DIAGNOSIS — M10071 Idiopathic gout, right ankle and foot: Secondary | ICD-10-CM | POA: Insufficient documentation

## 2020-08-10 DIAGNOSIS — N1832 Chronic kidney disease, stage 3b: Secondary | ICD-10-CM | POA: Insufficient documentation

## 2020-08-10 DIAGNOSIS — M109 Gout, unspecified: Secondary | ICD-10-CM

## 2020-08-10 DIAGNOSIS — E1122 Type 2 diabetes mellitus with diabetic chronic kidney disease: Secondary | ICD-10-CM | POA: Insufficient documentation

## 2020-08-10 DIAGNOSIS — Z7984 Long term (current) use of oral hypoglycemic drugs: Secondary | ICD-10-CM | POA: Insufficient documentation

## 2020-08-10 LAB — COMPREHENSIVE METABOLIC PANEL
ALT: 31 U/L (ref 0–44)
AST: 21 U/L (ref 15–41)
Albumin: 3.4 g/dL — ABNORMAL LOW (ref 3.5–5.0)
Alkaline Phosphatase: 87 U/L (ref 38–126)
Anion gap: 12 (ref 5–15)
BUN: 28 mg/dL — ABNORMAL HIGH (ref 8–23)
CO2: 22 mmol/L (ref 22–32)
Calcium: 9.2 mg/dL (ref 8.9–10.3)
Chloride: 105 mmol/L (ref 98–111)
Creatinine, Ser: 2.17 mg/dL — ABNORMAL HIGH (ref 0.61–1.24)
GFR, Estimated: 31 mL/min — ABNORMAL LOW (ref >60)
Glucose, Bld: 145 mg/dL — ABNORMAL HIGH (ref 70–99)
Potassium: 3.8 mmol/L (ref 3.5–5.1)
Sodium: 139 mmol/L (ref 135–145)
Total Bilirubin: 0.5 mg/dL (ref 0.3–1.2)
Total Protein: 7.4 g/dL (ref 6.5–8.1)

## 2020-08-10 LAB — CBC
HCT: 46.4 % (ref 39.0–52.0)
Hemoglobin: 14.2 g/dL (ref 13.0–17.0)
MCH: 22.6 pg — ABNORMAL LOW (ref 26.0–34.0)
MCHC: 30.6 g/dL (ref 30.0–36.0)
MCV: 73.9 fL — ABNORMAL LOW (ref 80.0–100.0)
Platelets: UNDETERMINED 10*3/uL (ref 150–400)
RBC: 6.28 MIL/uL — ABNORMAL HIGH (ref 4.22–5.81)
RDW: 17.1 % — ABNORMAL HIGH (ref 11.5–15.5)
WBC: 7.9 10*3/uL (ref 4.0–10.5)
nRBC: 0 % (ref 0.0–0.2)

## 2020-08-10 MED ORDER — ACETAMINOPHEN 500 MG PO TABS
1000.0000 mg | ORAL_TABLET | Freq: Once | ORAL | Status: AC
  Administered 2020-08-10: 1000 mg via ORAL
  Filled 2020-08-10: qty 2

## 2020-08-10 MED ORDER — COLCHICINE 0.6 MG PO TABS
1.2000 mg | ORAL_TABLET | Freq: Once | ORAL | Status: AC
  Administered 2020-08-10: 1.2 mg via ORAL
  Filled 2020-08-10: qty 2

## 2020-08-10 MED ORDER — COLCHICINE 0.6 MG PO TABS: ORAL_TABLET | ORAL | 0 refills | Status: DC

## 2020-08-10 MED ORDER — DICLOFENAC SODIUM 1 % EX GEL: 4.0000 g | Freq: Four times a day (QID) | CUTANEOUS | 0 refills | Status: DC

## 2020-08-10 MED ORDER — PREDNISONE 20 MG PO TABS
60.0000 mg | ORAL_TABLET | Freq: Once | ORAL | Status: AC
  Administered 2020-08-10: 60 mg via ORAL
  Filled 2020-08-10: qty 3

## 2020-08-10 MED ORDER — PREDNISONE 20 MG PO TABS: ORAL_TABLET | ORAL | 0 refills | Status: DC

## 2020-08-10 NOTE — ED Provider Notes (Signed)
[facility] EMERGENCY DEPARTMENT Provider Note   CSN: [id number] Arrival date & time: 08/10/20  0344     History Chief Complaint  Patient presents with  . Knee Pain  . Leg Swelling    Ruben Nay. is a 64 y.o. male.  64 yo M with a chief complaints of right foot pain and swelling. He also has noted that he has some weakness to the right leg with prolonged standing. He also feels like he has some tingling about his right lateral thigh that occurs off and on. Mostly with prolonged standing or walking. Denies back pain denies trauma. Thinks he had a fever the last for couple days last week but is resolved. No cough or congestion. No abdominal pain. He has had gout before but in the other foot he thinks it feels similar.  The history is provided by the patient.  Illness Severity:  Moderate Onset quality:  Gradual Duration:  2 months Timing:  Intermittent Progression:  Waxing and waning Chronicity:  New Associated symptoms: myalgias   Associated symptoms: no abdominal pain, no chest pain, no congestion, no diarrhea, no fever, no headaches, no rash, no shortness of breath and no vomiting        Past Medical History:  Diagnosis Date  . Gout    1-2 exacerbations per year.  Takes Advil PRN for pain.  Marland Kitchen Hyperlipidemia   . Hypertension   . Migraine   . Stroke ([place]) 07/01/2012   L sided weakness, slurred speech.  Admission [facility].    . Vitamin D deficiency 08/09/2019    Patient Active Problem List   Diagnosis Date Noted  . Stage 3b chronic kidney disease ([place]) 10/14/2019  . Vitamin D deficiency 08/09/2019  . Type 2 diabetes mellitus without complication, without long-term current use of insulin ([place]) 07/25/2019  . AKI (acute kidney injury) ([place]) 06/11/2019  . Hypertension associated with chronic kidney disease due to type 2 diabetes mellitus ([place]) 11/30/2011  . Hyperlipemia 11/30/2011  . Gout 11/30/2011  . Beta thalassemia ([place]) 11/30/2011     Past Surgical History:  Procedure Laterality Date  . Admission  07/01/2012   CVA (L sided weakness, slurred speech).  [facility].  . COLONOSCOPY  10/31/2008   no polyps.  Repeat in 10 years.  [place].  Marland Kitchen HERNIA REPAIR         Family History  Problem Relation Age of Onset  . Stroke Mother   . Hypertension Mother   . Hypertension Daughter   . Cancer Maternal Grandmother        lung  . Healthy Father        Died in motorcycle accident    Social History   Tobacco Use  . Smoking status: Never Smoker  . Smokeless tobacco: Never Used  Substance Use Topics  . Alcohol use: No    Alcohol/week: 0.0 standard drinks    Comment: former drinker  . Drug use: No    Home Medications Prior to Admission medications   Medication Sig Start Date End Date Taking? Authorizing Provider  glipiZIDE (GLUCOTROL XL) 2.5 MG 24 hr tablet TAKE 1 TABLET(2.5 MG) BY MOUTH DAILY WITH BREAKFAST 03/10/20   Horald Pollen, MD  amLODipine (NORVASC) 10 MG tablet Take 1 tablet (10 mg total) by mouth daily. 08/06/19   Wendall Mola, NP  aspirin 81 MG tablet Take 81 mg by mouth daily.    [provider]  atorvastatin (LIPITOR) 40 MG tablet Take 1 tablet (40  mg total) by mouth daily at 6 PM. 10/14/19 01/12/20  Horald Pollen, MD  colchicine 0.6 MG tablet Take one pill an hour after your first dose in the ED 08/10/20   Deno Etienne, DO  diclofenac (VOLTAREN) 75 MG EC tablet TAKE 1 TABLET(75 MG) BY MOUTH TWICE DAILY Patient not taking: Reported on 10/14/2019 01/08/19   Horald Pollen, MD  diclofenac Sodium (VOLTAREN) 1 % GEL Apply 4 g topically 4 (four) times daily. 08/10/20   Deno Etienne, DO  glucose blood (PRECISION QID TEST) test strip Use as directed to check blood sugar 2 times a day before meals. 06/14/19   [provider]  Lancet Devices (SIMPLE DIAGNOSTICS LANCING DEV) MISC Use as directed to check blood sugar 2 times a day before meals. 06/14/19   [provider]   Lancets 30G MISC Use as directed to check blood sugar 2 times a day before meals. 06/14/19   [provider]  losartan-hydrochlorothiazide (HYZAAR) 100-25 MG tablet TAKE 1 TABLET BY MOUTH DAILY 04/13/20 05/13/20  Horald Pollen, MD  NIFEdipine (PROCARDIA XL/NIFEDICAL-XL) 90 MG 24 hr tablet Take by mouth. 07/25/19   [provider]  predniSONE (DELTASONE) 20 MG tablet 2 tabs po daily x 4 days 08/10/20   Deno Etienne, DO    Allergies    Patient has no known allergies.  Review of Systems   Review of Systems  Constitutional: Negative for chills and fever.  HENT: Negative for congestion and facial swelling.   Eyes: Negative for discharge and visual disturbance.  Respiratory: Negative for shortness of breath.   Cardiovascular: Negative for chest pain and palpitations.  Gastrointestinal: Negative for abdominal pain, diarrhea and vomiting.  Musculoskeletal: Positive for arthralgias and myalgias.  Skin: Negative for color change and rash.  Neurological: Negative for tremors, syncope and headaches.  Psychiatric/Behavioral: Negative for confusion and dysphoric mood.    Physical Exam Updated Vital Signs BP (!) 215/126 (BP Location: Right Arm)   Pulse 77   Temp 99.2 F (37.3 C) (Oral)   Resp 16   SpO2 100%   Physical Exam Vitals and nursing note reviewed.  Constitutional:      Appearance: He is well-developed.  HENT:     Head: Normocephalic and atraumatic.  Eyes:     Pupils: Pupils are equal, round, and reactive to light.  Neck:     Vascular: No JVD.  Cardiovascular:     Rate and Rhythm: Normal rate and regular rhythm.     Heart sounds: No murmur heard.  No friction rub. No gallop.   Pulmonary:     Effort: No respiratory distress.     Breath sounds: No wheezing.  Abdominal:     General: There is no distension.     Tenderness: There is no abdominal tenderness. There is no guarding or rebound.  Musculoskeletal:        General: Normal range of motion.      Cervical back: Normal range of motion and neck supple.     Comments: Swelling localized to the foot and extending to the ankle. No erythema no warmth. Pulse motor and sensation are intact. He has significant pain along the MTP of the great toe. No pain with range of motion of the ankle. No appreciable swelling to the knee. Trace edema just above the ankle. Full range of motion of the knee without tenderness. Sensation to light touch intact diffusely throughout the lower leg. No midline spinal tenderness.  Skin:    Coloration:  Skin is not pale.     Findings: No rash.  Neurological:     Mental Status: He is alert and oriented to person, place, and time.  Psychiatric:        Behavior: Behavior normal.     ED Results / Procedures / Treatments   Labs (all labs ordered are listed, but only abnormal results are displayed) Labs Reviewed  CBC - Abnormal; Notable for the following components:      Result Value   RBC 6.28 (*)    MCV 73.9 (*)    MCH 22.6 (*)    RDW 17.1 (*)    All other components within normal limits  COMPREHENSIVE METABOLIC PANEL - Abnormal; Notable for the following components:   Glucose, Bld 145 (*)    BUN 28 (*)    Creatinine, Ser 2.17 (*)    Albumin 3.4 (*)    GFR, Estimated 31 (*)    All other components within normal limits    EKG None  Radiology DG Knee Complete 4 Views Right  Result Date: 08/10/2020 CLINICAL DATA:  Right knee pain and numbness. EXAM: RIGHT KNEE - COMPLETE 4+ VIEW COMPARISON:  None. FINDINGS: No fracture. No subluxation or dislocation. No worrisome lytic or sclerotic osseous abnormality. Trace spurring visible in the patellofemoral compartment. No joint effusion. IMPRESSION: Trace spurring in the patellofemoral compartment. Otherwise unremarkable. Electronically Signed   By: Misty Stanley M.D.   On: 08/10/2020 05:25    Procedures Procedures (including critical care time)  Medications Ordered in ED Medications  colchicine tablet 1.2 mg (has  no administration in time range)  acetaminophen (TYLENOL) tablet 1,000 mg (has no administration in time range)  predniSONE (DELTASONE) tablet 60 mg (has no administration in time range)    ED Course  I have reviewed the triage vital signs and the nursing notes.  Pertinent labs & imaging results that were available during my care of the patient were reviewed by me and considered in my medical decision making (see chart for details).    MDM Rules/Calculators/A&P                          64 yo M with a chief complaint of right foot and ankle pain. Going on for a couple months off and on. Most consistent with a gout flare. We will treat as such. He is describing some paresthesias off and on to the right lateral leg. Not having them currently. Neurovascularly intact. PCP follow-up.  Of note the patient is hypertensive. We will have him follow-up with his family doctor in the office.  1:19 PM:  I have discussed the diagnosis/risks/treatment options with the patient and believe the pt to be eligible for discharge home to follow-up with PCP, neuro. We also discussed returning to the ED immediately if new or worsening sx occur. We discussed the sx which are most concerning (e.g., sudden worsening pain, fever, inability to tolerate by mouth) that necessitate immediate return. Medications administered to the patient during their visit and any new prescriptions provided to the patient are listed below.  Medications given during this visit Medications  colchicine tablet 1.2 mg (has no administration in time range)  acetaminophen (TYLENOL) tablet 1,000 mg (has no administration in time range)  predniSONE (DELTASONE) tablet 60 mg (has no administration in time range)     The patient appears reasonably screen and/or stabilized for discharge and I doubt any other medical condition or other [facility] requiring further  screening, evaluation, or treatment in the ED at this time prior to discharge.   Final  Clinical Impression(s) / ED Diagnoses Final diagnoses:  Acute gout involving toe of right foot, unspecified cause  Primary hypertension    Rx / DC Orders ED Discharge Orders         Ordered    predniSONE (DELTASONE) 20 MG tablet        08/10/20 1313    colchicine 0.6 MG tablet        08/10/20 1313    diclofenac Sodium (VOLTAREN) 1 % GEL  4 times daily        08/10/20 Rigby, Seda Kronberg, DO 08/10/20 1320

## 2020-08-10 NOTE — Discharge Instructions (Signed)
Follow up with your family doc in the office. Return for redness to the skin swelling up to the knee if you develop a fever.

## 2020-08-10 NOTE — ED Triage Notes (Signed)
Pt reports right knee pain and numbness to the thigh x1 mo. Reports right foot swelling for the last month. Some fever last week.

## 2020-08-25 ENCOUNTER — Emergency Department (HOSPITAL_COMMUNITY): Payer: Self-pay

## 2020-08-25 ENCOUNTER — Emergency Department (HOSPITAL_COMMUNITY)
Admission: EM | Admit: 2020-08-25 | Discharge: 2020-08-25 | Disposition: A | Discharge status: Home / Self Care | Payer: Self-pay | Attending: Emergency Medicine | Admitting: Emergency Medicine

## 2020-08-25 DIAGNOSIS — Z794 Long term (current) use of insulin: Secondary | ICD-10-CM | POA: Insufficient documentation

## 2020-08-25 DIAGNOSIS — I10 Essential (primary) hypertension: Secondary | ICD-10-CM

## 2020-08-25 DIAGNOSIS — Z79899 Other long term (current) drug therapy: Secondary | ICD-10-CM | POA: Insufficient documentation

## 2020-08-25 DIAGNOSIS — E119 Type 2 diabetes mellitus without complications: Secondary | ICD-10-CM

## 2020-08-25 DIAGNOSIS — E1122 Type 2 diabetes mellitus with diabetic chronic kidney disease: Secondary | ICD-10-CM

## 2020-08-25 DIAGNOSIS — N1832 Chronic kidney disease, stage 3b: Secondary | ICD-10-CM | POA: Insufficient documentation

## 2020-08-25 DIAGNOSIS — I129 Hypertensive chronic kidney disease with stage 1 through stage 4 chronic kidney disease, or unspecified chronic kidney disease: Secondary | ICD-10-CM | POA: Insufficient documentation

## 2020-08-25 DIAGNOSIS — E785 Hyperlipidemia, unspecified: Secondary | ICD-10-CM

## 2020-08-25 DIAGNOSIS — Z7984 Long term (current) use of oral hypoglycemic drugs: Secondary | ICD-10-CM | POA: Insufficient documentation

## 2020-08-25 DIAGNOSIS — Z7982 Long term (current) use of aspirin: Secondary | ICD-10-CM | POA: Insufficient documentation

## 2020-08-25 DIAGNOSIS — G43109 Migraine with aura, not intractable, without status migrainosus: Secondary | ICD-10-CM

## 2020-08-25 LAB — COMPREHENSIVE METABOLIC PANEL
ALT: 27 U/L (ref 0–44)
AST: 18 U/L (ref 15–41)
Albumin: 3.5 g/dL (ref 3.5–5.0)
Alkaline Phosphatase: 89 U/L (ref 38–126)
Anion gap: 14 (ref 5–15)
BUN: 32 mg/dL — ABNORMAL HIGH (ref 8–23)
CO2: 21 mmol/L — ABNORMAL LOW (ref 22–32)
Calcium: 9 mg/dL (ref 8.9–10.3)
Chloride: 101 mmol/L (ref 98–111)
Creatinine, Ser: 2.61 mg/dL — ABNORMAL HIGH (ref 0.61–1.24)
GFR, Estimated: 27 mL/min — ABNORMAL LOW (ref >60)
Glucose, Bld: 140 mg/dL — ABNORMAL HIGH (ref 70–99)
Potassium: 3.7 mmol/L (ref 3.5–5.1)
Sodium: 136 mmol/L (ref 135–145)
Total Bilirubin: 0.7 mg/dL (ref 0.3–1.2)
Total Protein: 7.8 g/dL (ref 6.5–8.1)

## 2020-08-25 LAB — CBC
HCT: 51.7 % (ref 39.0–52.0)
Hemoglobin: 15.6 g/dL (ref 13.0–17.0)
MCH: 22.3 pg — ABNORMAL LOW (ref 26.0–34.0)
MCHC: 30.2 g/dL (ref 30.0–36.0)
MCV: 73.8 fL — ABNORMAL LOW (ref 80.0–100.0)
Platelets: 208 10*3/uL (ref 150–400)
RBC: 7.01 MIL/uL — ABNORMAL HIGH (ref 4.22–5.81)
RDW: 17.1 % — ABNORMAL HIGH (ref 11.5–15.5)
WBC: 6.8 10*3/uL (ref 4.0–10.5)
nRBC: 0 % (ref 0.0–0.2)

## 2020-08-25 LAB — DIFFERENTIAL
Abs Immature Granulocytes: 0.02 10*3/uL (ref 0.00–0.07)
Basophils Absolute: 0 10*3/uL (ref 0.0–0.1)
Basophils Relative: 1 %
Eosinophils Absolute: 0.2 10*3/uL (ref 0.0–0.5)
Eosinophils Relative: 2 %
Immature Granulocytes: 0 %
Lymphocytes Relative: 28 %
Lymphs Abs: 1.9 10*3/uL (ref 0.7–4.0)
Monocytes Absolute: 0.4 10*3/uL (ref 0.1–1.0)
Monocytes Relative: 6 %
Neutro Abs: 4.3 10*3/uL (ref 1.7–7.7)
Neutrophils Relative %: 63 %

## 2020-08-25 LAB — I-STAT CHEM 8, ED
BUN: 35 mg/dL — ABNORMAL HIGH (ref 8–23)
Calcium, Ion: 1.05 mmol/L — ABNORMAL LOW (ref 1.15–1.40)
Chloride: 105 mmol/L (ref 98–111)
Creatinine, Ser: 2.6 mg/dL — ABNORMAL HIGH (ref 0.61–1.24)
Glucose, Bld: 142 mg/dL — ABNORMAL HIGH (ref 70–99)
HCT: 52 % (ref 39.0–52.0)
Hemoglobin: 17.7 g/dL — ABNORMAL HIGH (ref 13.0–17.0)
Potassium: 3.7 mmol/L (ref 3.5–5.1)
Sodium: 139 mmol/L (ref 135–145)
TCO2: 24 mmol/L (ref 22–32)

## 2020-08-25 LAB — URINALYSIS, ROUTINE W REFLEX MICROSCOPIC
Bilirubin Urine: NEGATIVE
Glucose, UA: NEGATIVE mg/dL
Hgb urine dipstick: NEGATIVE
Ketones, ur: NEGATIVE mg/dL
Leukocytes,Ua: NEGATIVE
Nitrite: NEGATIVE
Protein, ur: >=300 mg/dL — AB
Specific Gravity, Urine: 1.028 (ref 1.005–1.030)
pH: 5 (ref 5.0–8.0)

## 2020-08-25 LAB — PROTIME-INR
INR: 1 (ref 0.8–1.2)
Prothrombin Time: 13.1 seconds (ref 11.4–15.2)

## 2020-08-25 LAB — CBG MONITORING, ED: Glucose-Capillary: 137 mg/dL — ABNORMAL HIGH (ref 70–99)

## 2020-08-25 LAB — APTT: aPTT: 33 seconds (ref 24–36)

## 2020-08-25 LAB — RAPID URINE DRUG SCREEN, HOSP PERFORMED
Amphetamines: NOT DETECTED
Barbiturates: NOT DETECTED
Benzodiazepines: NOT DETECTED
Cocaine: NOT DETECTED
Opiates: NOT DETECTED
Tetrahydrocannabinol: NOT DETECTED

## 2020-08-25 MED ORDER — LOSARTAN POTASSIUM 50 MG PO TABS
100.0000 mg | ORAL_TABLET | Freq: Every day | ORAL | Status: DC
  Administered 2020-08-25: 100 mg via ORAL
  Filled 2020-08-25: qty 2

## 2020-08-25 MED ORDER — AMLODIPINE BESYLATE 10 MG PO TABS: 10.0000 mg | ORAL_TABLET | Freq: Every day | ORAL | 3 refills | Status: DC

## 2020-08-25 MED ORDER — GLIPIZIDE ER 2.5 MG PO TB24: ORAL_TABLET | ORAL | 2 refills | Status: DC

## 2020-08-25 MED ORDER — LABETALOL HCL 5 MG/ML IV SOLN
10.0000 mg | Freq: Once | INTRAVENOUS | Status: AC
  Administered 2020-08-25: 10 mg via INTRAVENOUS
  Filled 2020-08-25: qty 4

## 2020-08-25 MED ORDER — AMLODIPINE BESYLATE 5 MG PO TABS
10.0000 mg | ORAL_TABLET | Freq: Every day | ORAL | Status: DC
  Administered 2020-08-25: 10 mg via ORAL
  Filled 2020-08-25: qty 2

## 2020-08-25 MED ORDER — NIFEDIPINE ER OSMOTIC RELEASE 90 MG PO TB24
90.0000 mg | ORAL_TABLET | ORAL | Status: AC
  Administered 2020-08-25: 90 mg via ORAL
  Filled 2020-08-25: qty 1

## 2020-08-25 MED ORDER — KETOROLAC TROMETHAMINE 30 MG/ML IJ SOLN
30.0000 mg | Freq: Once | INTRAMUSCULAR | Status: AC
  Administered 2020-08-25: 30 mg via INTRAVENOUS
  Filled 2020-08-25: qty 1

## 2020-08-25 MED ORDER — ATORVASTATIN CALCIUM 40 MG PO TABS: 40.0000 mg | ORAL_TABLET | Freq: Every day | ORAL | 3 refills | Status: DC

## 2020-08-25 MED ORDER — PROCHLORPERAZINE EDISYLATE 10 MG/2ML IJ SOLN
10.0000 mg | Freq: Once | INTRAMUSCULAR | Status: AC
  Administered 2020-08-25: 10 mg via INTRAVENOUS
  Filled 2020-08-25: qty 2

## 2020-08-25 MED ORDER — HYDROCHLOROTHIAZIDE 25 MG PO TABS
25.0000 mg | ORAL_TABLET | Freq: Every day | ORAL | Status: DC
  Administered 2020-08-25: 25 mg via ORAL
  Filled 2020-08-25: qty 1

## 2020-08-25 MED ORDER — LOSARTAN POTASSIUM-HCTZ 100-25 MG PO TABS: 1.0000 | ORAL_TABLET | Freq: Every day | ORAL | Status: DC

## 2020-08-25 MED ORDER — GADOBUTROL 1 MMOL/ML IV SOLN
8.0000 mL | Freq: Once | INTRAVENOUS | Status: AC | PRN
  Administered 2020-08-25: 8 mL via INTRAVENOUS

## 2020-08-25 MED ORDER — NIFEDIPINE ER OSMOTIC RELEASE 90 MG PO TB24: 90.0000 mg | ORAL_TABLET | Freq: Every day | ORAL | 3 refills | Status: DC

## 2020-08-25 MED ORDER — LOSARTAN POTASSIUM-HCTZ 100-25 MG PO TABS: 1.0000 | ORAL_TABLET | Freq: Every day | ORAL | 0 refills | Status: DC

## 2020-08-25 NOTE — Consult Note (Addendum)
Neurology Consultation  Reason for Consult: Possible stroke Referring Physician: Dr. Noemi Chapel  CC: Right-sided weakness and right arm pain  History is obtained from: Patient  HPI: Ruben Camacho. is a 64 y.o. male with past medical history of vitamin D deficiency, stroke, migraine headaches, hypertension, hyperlipidemia and gout.  Patient states that he was watching TV last Saturday when he noticed that his arm started hurting him and felt weak.  The pain initially started out in the hand but then migrated proximally.  He noted that he cannot hold objects and or lift his arm.  He states that this got worse over time to the point where he woke up on Sunday morning and his right leg also felt weak and numb.  He states that he could walk but it took a long time and he had a hold onto the walls.  His symptoms remain the same on Monday but this morning he noticed that his speech was slightly different thus he came to the hospital.  He states that he lives alone, is a Pharmacist, hospital, has had no stressors, does not drink, smoke and or do illicit drugs.  He endorses that he does take a full dose aspirin on a daily basis and has not missed any.  Patient does have a history of migraine headaches.  He also endorses the fact that with his migraine headaches he does get numbness.  Instead of a throbbing headache like his migraines he is having a 8 out of 10 headache at this point time.   LKW: 08/22/2020 at approximately 1700 hrs. tpa given?: no, out of [place] Premorbid modified Rankin scale (mRS): 0 NIHSS 1a Level of Conscious.: 0 1b LOC Questions: 0 1c LOC Commands: 0 2 Best Gaze: 0 3 Visual: 0 4 Facial Palsy: 0 5a Motor Arm - left: 0 5b Motor Arm - Right: 1 6a Motor Leg - Left: 0 6b Motor Leg - Right: 1 7 Limb Ataxia: 0 8 Sensory: 1 9 Best Language: 0 10 Dysarthria: 0 11 Extinct. and Inatten.: 0 TOTAL: 3  Past Medical History:  Diagnosis Date  . Gout    1-2 exacerbations per year.  Takes  Advil PRN for pain.  Marland Kitchen Hyperlipidemia   . Hypertension   . Migraine   . Stroke ([place]) 07/01/2012   L sided weakness, slurred speech.  Admission [facility].    . Vitamin D deficiency 08/09/2019    Family History  Problem Relation Age of Onset  . Stroke Mother   . Hypertension Mother   . Hypertension Daughter   . Cancer Maternal Grandmother        lung  . Healthy Father        Died in motorcycle accident   Social History:   reports that he has never smoked. He has never used smokeless tobacco. He reports that he does not drink alcohol and does not use drugs.  Medications No current facility-administered medications for this encounter.  Current Outpatient Medications:  .  glipiZIDE (GLUCOTROL XL) 2.5 MG 24 hr tablet, TAKE 1 TABLET(2.5 MG) BY MOUTH DAILY WITH BREAKFAST (Patient taking differently: Take 2.5 mg by mouth daily with breakfast. ), Disp: 90 tablet, Rfl: 2 .  amLODipine (NORVASC) 10 MG tablet, Take 1 tablet (10 mg total) by mouth daily., Disp: 90 tablet, Rfl: 3 .  aspirin 81 MG tablet, Take 81 mg by mouth daily., Disp: , Rfl:  .  atorvastatin (LIPITOR) 40 MG tablet, Take 1 tablet (40 mg total) by mouth  daily at 6 PM., Disp: 90 tablet, Rfl: 3 .  colchicine 0.6 MG tablet, Take one pill an hour after your first dose in the ED (Patient not taking: Reported on 08/25/2020), Disp: 1 tablet, Rfl: 0 .  diclofenac (VOLTAREN) 75 MG EC tablet, TAKE 1 TABLET(75 MG) BY MOUTH TWICE DAILY (Patient not taking: Reported on 10/14/2019), Disp: 30 tablet, Rfl: 0 .  diclofenac Sodium (VOLTAREN) 1 % GEL, Apply 4 g topically 4 (four) times daily., Disp: 100 g, Rfl: 0 .  glucose blood (PRECISION QID TEST) test strip, Use as directed to check blood sugar 2 times a day before meals., Disp: , Rfl:  .  Lancet Devices (SIMPLE DIAGNOSTICS LANCING DEV) MISC, Use as directed to check blood sugar 2 times a day before meals., Disp: , Rfl:  .  Lancets 30G MISC, Use as directed to check blood sugar 2 times a  day before meals., Disp: , Rfl:  .  losartan-hydrochlorothiazide (HYZAAR) 100-25 MG tablet, TAKE 1 TABLET BY MOUTH DAILY, Disp: 30 tablet, Rfl: 0 .  NIFEdipine (PROCARDIA XL/NIFEDICAL-XL) 90 MG 24 hr tablet, Take by mouth., Disp: , Rfl:  .  predniSONE (DELTASONE) 20 MG tablet, 2 tabs po daily x 4 days (Patient not taking: Reported on 08/25/2020), Disp: 8 tablet, Rfl: 0  ROS:     General ROS: negative for - chills, fatigue, fever, night sweats, weight gain or weight loss Psychological ROS: negative for - behavioral disorder, hallucinations, memory difficulties, mood swings or suicidal ideation Ophthalmic ROS: negative for - blurry vision, double vision, eye pain or loss of vision ENT ROS: negative for - epistaxis, nasal discharge, oral lesions, sore throat, tinnitus or vertigo Allergy and Immunology ROS: negative for - hives or itchy/watery eyes Hematological and Lymphatic ROS: negative for - bleeding problems, bruising or swollen lymph nodes Endocrine ROS: negative for - galactorrhea, hair pattern changes, polydipsia/polyuria or temperature intolerance Respiratory ROS: negative for - cough, hemoptysis, shortness of breath or wheezing Cardiovascular ROS: negative for - chest pain, dyspnea on exertion, edema or irregular heartbeat Gastrointestinal ROS: negative for - abdominal pain, diarrhea, hematemesis, nausea/vomiting or stool incontinence Genito-Urinary ROS: negative for - dysuria, hematuria, incontinence or urinary frequency/urgency Musculoskeletal ROS: Positive for -  muscular weakness Neurological ROS: as noted in HPI Dermatological ROS: negative for rash and skin lesion changes  Exam: Current vital signs: BP (!) 249/160 (BP Location: Right Arm)   Pulse 90   Temp 98.6 F (37 C) (Oral)   Resp 18   Ht 5\' 9"  (1.753 m)   Wt 83.9 kg   SpO2 98%   BMI 27.32 kg/m  Vital signs in last 24 hours: Temp:  [98.6 F (37 C)] 98.6 F (37 C) (10/26 0747) Pulse Rate:  [90] 90 (10/26  0747) Resp:  [18] 18 (10/26 0747) BP: (249)/(160) 249/160 (10/26 0747) SpO2:  [98 %] 98 % (10/26 0747) Weight:  [83.9 kg] 83.9 kg (10/26 0747)   Constitutional: Appears well-developed and well-nourished.  Psych: Affect appropriate to situation Eyes: No scleral injection HENT: No OP obstrucion Head: Normocephalic.  Cardiovascular: Palpable Respiratory: Effort normal, non-labored breathing GI: Soft.  No distension. There is no tenderness.  Skin: WDI  Neuro: Mental Status: Patient is awake, alert, oriented to person, place, month, year, and situation. Speech-intact naming, repeating, comprehension.  Patient will at times speak with normal speech and other times to start stuttering.  There is no aphasia or dysarthria.  Patient is able to give a coherent history.  Patient is also able  to follow commands without difficulty. Cranial Nerves: II: Visual Fields are full.  III,IV, VI: EOMI without ptosis or diploplia. Pupils equal, round and reactive to light V: Facial sensation is decreased on the right.  Patient splits midline to tuning fork throughout the face VII: Facial movement is symmetric.  VIII: hearing is intact to voice X: Palat elevates symmetrically XI: Shoulder shrug is symmetric. XII: tongue is midline without atrophy or fasciculations.  Motor: Patient has 5/5 strength throughout however is noted that he has significant giveaway weakness on the right arm along with grip.  He has slight drift with the right upper and lower extremity Sensory: Patient shows decreased sensation on the right side splitting midline throughout the chest and abdomen.  Initially he stated his he had severe pain on his right forearm however when testing DSS he showed no discomfort or pain. Deep Tendon Reflexes: 2+ in the upper extremities 1+ at the knee jerk Plantars: Toes are downgoing bilaterally.  Cerebellar: Finger-nose-finger and heel-to-shin showed no dysmetria  Labs I have reviewed labs in  epic and the results pertinent to this consultation are:  CBC    Component Value Date/Time   WBC 6.8 08/25/2020 0759   RBC 7.01 (H) 08/25/2020 0759   HGB 17.7 (H) 08/25/2020 0800   HGB 13.4 09/04/2018 1211   HCT 52.0 08/25/2020 0800   HCT 41.1 09/04/2018 1211   PLT 208 08/25/2020 0759   PLT 190 09/04/2018 1211   MCV 73.8 (L) 08/25/2020 0759   MCV 71 (L) 09/04/2018 1211   MCH 22.3 (L) 08/25/2020 0759   MCHC 30.2 08/25/2020 0759   RDW 17.1 (H) 08/25/2020 0759   RDW 15.8 (H) 09/04/2018 1211   LYMPHSABS 1.9 08/25/2020 0759   MONOABS 0.4 08/25/2020 0759   EOSABS 0.2 08/25/2020 0759   BASOSABS 0.0 08/25/2020 0759    CMP     Component Value Date/Time   NA 139 08/25/2020 0800   NA 136 08/06/2019 1400   K 3.7 08/25/2020 0800   CL 105 08/25/2020 0800   CO2 21 (L) 08/25/2020 0759   GLUCOSE 142 (H) 08/25/2020 0800   BUN 35 (H) 08/25/2020 0800   BUN 20 08/06/2019 1400   CREATININE 2.60 (H) 08/25/2020 0800   CREATININE 1.28 08/19/2015 0844   CALCIUM 9.0 08/25/2020 0759   PROT 7.8 08/25/2020 0759   PROT 6.7 08/06/2019 1400   ALBUMIN 3.5 08/25/2020 0759   ALBUMIN 4.2 08/06/2019 1400   AST 18 08/25/2020 0759   ALT 27 08/25/2020 0759   ALKPHOS 89 08/25/2020 0759   BILITOT 0.7 08/25/2020 0759   BILITOT 0.5 08/06/2019 1400   GFRNONAA 27 (L) 08/25/2020 0759   GFRNONAA 76 12/01/2014 1037   GFRAA 33 (L) 08/06/2019 1400   GFRAA 88 12/01/2014 1037    Lipid Panel     Component Value Date/Time   CHOL 214 (H) 09/04/2018 1211   TRIG 213 (H) 09/04/2018 1211   HDL 55 09/04/2018 1211   CHOLHDL 3.9 09/04/2018 1211   CHOLHDL 3.6 04/19/2014 1042   VLDL 27 04/19/2014 1042   LDLCALC 116 (H) 09/04/2018 1211     Imaging I have reviewed the images obtained:  CT-scan of the brain-no acute intracranial findings  MRI examination of the brain-No evidence of recent infarction, hemorrhage, or mass. Moderate chronic microvascular ischemic changes and a few small chronic infarcts as  described.  Unremarkable MRA of the head.  No measurable stenosis at the ICA origins. 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  Assessment:  This is a 64 year old male with progressive weakness on the right side which also includes pain in his right arm and decreased sensation in the arm and leg.  Patient came to the hospital today secondary to the previous symptoms plus speech abnormalities.  Exam is inconsistent and shows multiple functional attributes.  That said, patient does have history of migraine headaches in which she has had neurological symptoms such as numbness before.  He is endorsing a 8/10 headache at this moment and he does state that the headache started when he had the symptoms.  At this time outside the differential includes complicated migraine headache versus headache secondary to to hypertension  Impression: -Complicated migraine headache -Hypertensive urgency  Recommendations: -Blood pressure control -Migraine cocktail  Etta Quill PA-C [facility] [phone]  M-F  (9:00 am- 5:00 PM)  08/25/2020, 10:06 AM   I have seen the patient and reviewed the above note.  He has findings on exam including splitting the midline to vibration and pinprick,  marked inconsistencies to strength testing, that are consistent with nonorganic etiology.  His MRI is negative, and I do not feel that his vertebral artery finding is contributory.  With a nonphysiological exam, could consider embellishment with migraine, and could consider treating his underlying headache.  Also, he does have severe hypertension, but again the findings on exam are not consistent with a neurological etiology.  No further work-up needed at this time from a neurological perspective.  Roland Rack, MD [facility] [phone]  If 7pm- 7am, please page neurology on call  as listed in [place].

## 2020-08-25 NOTE — ED Triage Notes (Addendum)
Pt reports right sided weakness since Sunday and trouble talking when he woke up. States he went to bed around 1130 last night. Endorses 10/10 right arm pain. Denies CP. Hx of CVA in 2013, states he is on BP meds but no blood thinners.

## 2020-08-25 NOTE — ED Provider Notes (Signed)
[facility] EMERGENCY DEPARTMENT Provider Note   CSN: [id number] Arrival date & time: 08/25/20  4034     History Chief Complaint  Patient presents with  . Stroke Symptoms    [name]. is a 64 y.o. male.  HPI   This patient is a 64 year old male, he has a known history of migraine headaches as well as a stroke which caused left-sided weakness and some slurred speech.  That was at a different hospital.  He is treated for hypertension and hyperlipidemia and has known stage III chronic kidney disease.  The patient denies being a diabetic, denies alcohol or tobacco use and denies cocaine use or other drugs of abuse.  He currently takes glipizide, amlodipine, atorvastatin, losartan hydrochlorothiazide   He states that on Sunday, 2 days ago he developed right-sided weakness in his arm and his leg which was mild, yesterday it seemed to be a little bit worse and yesterday he noticed that his speech was different.  The speech progressed and this morning was even more dense with his difficulty speaking.  He drove himself to the hospital and arrives by private vehicle with the symptoms.  He is having a difficult time speaking, level 5 caveat applies.  Of note the patient's blood pressure was 249/160 on arrival.  Past Medical History:  Diagnosis Date  . Gout    1-2 exacerbations per year.  Takes Advil PRN for pain.  Marland Kitchen Hyperlipidemia   . Hypertension   . Migraine   . Stroke ([place]) 07/01/2012   L sided weakness, slurred speech.  Admission [facility].    . Vitamin D deficiency 08/09/2019    Patient Active Problem List   Diagnosis Date Noted  . Stage 3b chronic kidney disease ([place]) 10/14/2019  . Vitamin D deficiency 08/09/2019  . Type 2 diabetes mellitus without complication, without long-term current use of insulin ([place]) 07/25/2019  . AKI (acute kidney injury) ([place]) 06/11/2019  . Hypertension associated with chronic kidney disease due to type 2 diabetes  mellitus ([place]) 11/30/2011  . Hyperlipemia 11/30/2011  . Gout 11/30/2011  . Beta thalassemia ([place]) 11/30/2011    Past Surgical History:  Procedure Laterality Date  . Admission  07/01/2012   CVA (L sided weakness, slurred speech).  [facility].  . COLONOSCOPY  10/31/2008   no polyps.  Repeat in 10 years.  [place].  Marland Kitchen HERNIA REPAIR         Family History  Problem Relation Age of Onset  . Stroke Mother   . Hypertension Mother   . Hypertension Daughter   . Cancer Maternal Grandmother        lung  . Healthy Father        Died in motorcycle accident    Social History   Tobacco Use  . Smoking status: Never Smoker  . Smokeless tobacco: Never Used  Substance Use Topics  . Alcohol use: No    Alcohol/week: 0.0 standard drinks    Comment: former drinker  . Drug use: No    Home Medications Prior to Admission medications   Medication Sig Start Date End Date Taking? Authorizing Provider  aspirin 81 MG tablet Take 81 mg by mouth daily.   Yes [provider]  diclofenac Sodium (VOLTAREN) 1 % GEL Apply 4 g topically 4 (four) times daily. 08/10/20  Yes Deno Etienne, DO  amLODipine (NORVASC) 10 MG tablet Take 1 tablet (10 mg total) by mouth daily. 08/25/20   Noemi Chapel, MD  atorvastatin (LIPITOR) 40 MG  tablet Take 1 tablet (40 mg total) by mouth daily at 6 PM. 08/25/20 11/23/20  Noemi Chapel, MD  glipiZIDE (GLUCOTROL XL) 2.5 MG 24 hr tablet TAKE 1 TABLET(2.5 MG) BY MOUTH DAILY WITH BREAKFAST 08/25/20   Noemi Chapel, MD  glucose blood (PRECISION QID TEST) test strip Use as directed to check blood sugar 2 times a day before meals. 06/14/19   [provider]  Lancet Devices (SIMPLE DIAGNOSTICS LANCING DEV) MISC Use as directed to check blood sugar 2 times a day before meals. 06/14/19   [provider]  Lancets 30G MISC Use as directed to check blood sugar 2 times a day before meals. 06/14/19   [provider]  losartan-hydrochlorothiazide (HYZAAR) 100-25 MG tablet  Take 1 tablet by mouth daily. 08/25/20 09/24/20  Noemi Chapel, MD  NIFEdipine (PROCARDIA XL/NIFEDICAL-XL) 90 MG 24 hr tablet Take 1 tablet (90 mg total) by mouth daily. 08/25/20 09/24/20  Noemi Chapel, MD  colchicine 0.6 MG tablet Take one pill an hour after your first dose in the ED Patient not taking: Reported on 08/25/2020 08/10/20 08/25/20  Deno Etienne, DO    Allergies    Patient has no known allergies.  Review of Systems   Review of Systems  All other systems reviewed and are negative.   Physical Exam Updated Vital Signs BP (!) 197/101   Pulse 65   Temp 98.6 F (37 C) (Oral)   Resp 20   Ht 1.753 m (5\' 9" )   Wt 83.9 kg   SpO2 93%   BMI 27.32 kg/m   Physical Exam Constitutional:      Comments: The patient is in acute distress and ill-appearing  HENT:     Head:     Comments: No signs of trauma to the head or the face    Nose:     Comments: Normal-appearing nose, no drainage    Mouth/Throat:     Comments: Oropharynx is clear and moist Eyes:     Comments: Pupils are equal and round, extraocular movements are intact, conjunctive are clear without discharge or scleral icterus  Neck:     Comments: Neck is supple, no lymphadenopathy Cardiovascular:     Comments: Cardiac exam is unremarkable with normal S1-S2 and no murmurs, normal pulses at the radial arteries Pulmonary:     Comments: Lungs are clear to auscultation bilaterally Abdominal:     Comments: Soft nontender abdomen with no masses  Musculoskeletal:     Comments: No edema in the legs, there is some tenderness in the right forearm but no obvious visual abnormality  Skin:    Comments: No rash to the skin  Neurological:     Comments: The patient has some stuttering speech, sometimes is able to speak clearly but sometimes is slurring his words or having difficulty stuttering.  He has a slight drift of his right arm, a slight weak grip on the right and a slight weakness to the right leg when he tries to lift.   There is no facial droop, he has a sensory deficit to the right arm and the right leg.     ED Results / Procedures / Treatments   Labs (all labs ordered are listed, but only abnormal results are displayed) Labs Reviewed  CBC - Abnormal; Notable for the following components:      Result Value   RBC 7.01 (*)    MCV 73.8 (*)    MCH 22.3 (*)    RDW 17.1 (*)  All other components within normal limits  COMPREHENSIVE METABOLIC PANEL - Abnormal; Notable for the following components:   CO2 21 (*)    Glucose, Bld 140 (*)    BUN 32 (*)    Creatinine, Ser 2.61 (*)    GFR, Estimated 27 (*)    All other components within normal limits  URINALYSIS, ROUTINE W REFLEX MICROSCOPIC - Abnormal; Notable for the following components:   APPearance HAZY (*)    Protein, ur >=300 (*)    Bacteria, UA RARE (*)    All other components within normal limits  CBG MONITORING, ED - Abnormal; Notable for the following components:   Glucose-Capillary 137 (*)    All other components within normal limits  I-STAT CHEM 8, ED - Abnormal; Notable for the following components:   BUN 35 (*)    Creatinine, Ser 2.60 (*)    Glucose, Bld 142 (*)    Calcium, Ion 1.05 (*)    Hemoglobin 17.7 (*)    All other components within normal limits  PROTIME-INR  APTT  DIFFERENTIAL  RAPID URINE DRUG SCREEN, HOSP PERFORMED  ETHANOL    EKG EKG Interpretation  Date/Time:  Tuesday August 25 2020 07:42:36 EDT Ventricular Rate:  86 PR Interval:  140 QRS Duration: 80 QT Interval:  368 QTC Calculation: 440 R Axis:   -47 Text Interpretation: Normal sinus rhythm Pulmonary disease pattern Left anterior fascicular block diffuse abnormal ST and T wave abnormalities similar to prior Confirmed by Noemi Chapel [phone]) on 08/25/2020 8:02:41 AM Also confirmed by Noemi Chapel [phone]), editor Hattie Perch ([phone])  on 08/25/2020 9:45:13 AM   Radiology CT HEAD WO CONTRAST  Result Date: 08/25/2020 CLINICAL DATA:  Right-sided  weakness EXAM: CT HEAD WITHOUT CONTRAST TECHNIQUE: Contiguous axial images were obtained from the base of the skull through the vertex without intravenous contrast. COMPARISON:  12/08/2014 FINDINGS: Brain: No evidence of acute infarction, hemorrhage, hydrocephalus, extra-axial collection or mass lesion/mass effect. Progressive mild low-density changes within the periventricular and subcortical white matter compatible with chronic microvascular ischemic change. Mild diffuse cerebral volume loss. Vascular: Atherosclerotic calcifications involving the large vessels of the skull base. No unexpected hyperdense vessel. Skull: Normal. Negative for fracture or focal lesion. Sinuses/Orbits: No acute finding. Other: None. IMPRESSION: 1. No acute intracranial findings. 2. Chronic microvascular ischemic change and cerebral volume loss. Electronically Signed   By: Davina Poke D.O.   On: 08/25/2020 08:25   MR ANGIO HEAD WO CONTRAST  Result Date: 08/25/2020 CLINICAL DATA:  Right-sided weakness EXAM: MRI HEAD WITHOUT CONTRAST MRA HEAD WITHOUT CONTRAST MRA NECK WITHOUT AND WITH CONTRAST TECHNIQUE: Multiplanar, multiecho pulse sequences of the brain and surrounding structures were obtained without intravenous contrast. Angiographic images of the Circle of Willis were obtained using MRA technique without intravenous contrast. Angiographic images of the neck were obtained using MRA technique without and with intravenous contrast. Carotid stenosis measurements (when applicable) are obtained utilizing NASCET criteria, using the distal internal carotid diameter as the denominator. CONTRAST:  67mL GADAVIST GADOBUTROL 1 MMOL/ML IV SOLN COMPARISON:  2016 MRI brain FINDINGS: MRI HEAD Brain: There is no acute infarction or intracranial hemorrhage. There is no intracranial mass, mass effect, or edema. There is no hydrocephalus or extra-axial fluid collection. Chronic bilateral thalamocapsular infarcts. Small chronic right cerebellar  infarcts. Additional patchy and confluent areas of T2 hyperintensity in the supratentorial white matter are nonspecific but probably reflect moderate chronic microvascular ischemic changes. Punctate focus of susceptibility in the posterior right temporal white matter likely reflects a  chronic microhemorrhage. Vascular: Major vessel flow voids at the skull base are preserved. Skull and upper cervical spine: Normal marrow signal is preserved. Sinuses/Orbits: Paranasal sinuses are aerated. Orbits are unremarkable. Other: Sella is unremarkable.  Mastoid air cells are clear. MRA HEAD Intracranial internal carotid arteries are patent. Middle and anterior cerebral arteries are patent. Intracranial vertebral arteries, basilar artery, posterior cerebral arteries are patent. There is no significant stenosis or aneurysm. MRA NECK Common, internal, and external carotid arteries are patent. There is no measurable stenosis at the ICA origin. Extracranial left vertebral artery is patent. The proximal extracranial right vertebral artery is not visualized. There is faint visualization of the mid to distal V2 segment and the V3 segment is patent. IMPRESSION: No evidence of recent infarction, hemorrhage, or mass. Moderate chronic microvascular ischemic changes and a few small chronic infarcts as described. Unremarkable MRA of the head. No measurable stenosis at the ICA origins. Likely chronic occlusion or high-grade stenosis of the proximal extracranial right vertebral artery with reconstitution in the neck more distally. Electronically Signed   By: Macy Mis M.D.   On: 08/25/2020 10:37   MR Angiogram Neck W or Wo Contrast  Result Date: 08/25/2020 CLINICAL DATA:  Right-sided weakness EXAM: MRI HEAD WITHOUT CONTRAST MRA HEAD WITHOUT CONTRAST MRA NECK WITHOUT AND WITH CONTRAST TECHNIQUE: Multiplanar, multiecho pulse sequences of the brain and surrounding structures were obtained without intravenous contrast. Angiographic  images of the Circle of Willis were obtained using MRA technique without intravenous contrast. Angiographic images of the neck were obtained using MRA technique without and with intravenous contrast. Carotid stenosis measurements (when applicable) are obtained utilizing NASCET criteria, using the distal internal carotid diameter as the denominator. CONTRAST:  77mL GADAVIST GADOBUTROL 1 MMOL/ML IV SOLN COMPARISON:  2016 MRI brain FINDINGS: MRI HEAD Brain: There is no acute infarction or intracranial hemorrhage. There is no intracranial mass, mass effect, or edema. There is no hydrocephalus or extra-axial fluid collection. Chronic bilateral thalamocapsular infarcts. Small chronic right cerebellar infarcts. Additional patchy and confluent areas of T2 hyperintensity in the supratentorial white matter are nonspecific but probably reflect moderate chronic microvascular ischemic changes. Punctate focus of susceptibility in the posterior right temporal white matter likely reflects a chronic microhemorrhage. Vascular: Major vessel flow voids at the skull base are preserved. Skull and upper cervical spine: Normal marrow signal is preserved. Sinuses/Orbits: Paranasal sinuses are aerated. Orbits are unremarkable. Other: Sella is unremarkable.  Mastoid air cells are clear. MRA HEAD Intracranial internal carotid arteries are patent. Middle and anterior cerebral arteries are patent. Intracranial vertebral arteries, basilar artery, posterior cerebral arteries are patent. There is no significant stenosis or aneurysm. MRA NECK Common, internal, and external carotid arteries are patent. There is no measurable stenosis at the ICA origin. Extracranial left vertebral artery is patent. The proximal extracranial right vertebral artery is not visualized. There is faint visualization of the mid to distal V2 segment and the V3 segment is patent. IMPRESSION: No evidence of recent infarction, hemorrhage, or mass. Moderate chronic microvascular  ischemic changes and a few small chronic infarcts as described. Unremarkable MRA of the head. No measurable stenosis at the ICA origins. Likely chronic occlusion or high-grade stenosis of the proximal extracranial right vertebral artery with reconstitution in the neck more distally. Electronically Signed   By: Macy Mis M.D.   On: 08/25/2020 10:37   MR BRAIN WO CONTRAST  Result Date: 08/25/2020 CLINICAL DATA:  Right-sided weakness EXAM: MRI HEAD WITHOUT CONTRAST MRA HEAD WITHOUT CONTRAST MRA NECK WITHOUT  AND WITH CONTRAST TECHNIQUE: Multiplanar, multiecho pulse sequences of the brain and surrounding structures were obtained without intravenous contrast. Angiographic images of the Circle of Willis were obtained using MRA technique without intravenous contrast. Angiographic images of the neck were obtained using MRA technique without and with intravenous contrast. Carotid stenosis measurements (when applicable) are obtained utilizing NASCET criteria, using the distal internal carotid diameter as the denominator. CONTRAST:  63mL GADAVIST GADOBUTROL 1 MMOL/ML IV SOLN COMPARISON:  2016 MRI brain FINDINGS: MRI HEAD Brain: There is no acute infarction or intracranial hemorrhage. There is no intracranial mass, mass effect, or edema. There is no hydrocephalus or extra-axial fluid collection. Chronic bilateral thalamocapsular infarcts. Small chronic right cerebellar infarcts. Additional patchy and confluent areas of T2 hyperintensity in the supratentorial white matter are nonspecific but probably reflect moderate chronic microvascular ischemic changes. Punctate focus of susceptibility in the posterior right temporal white matter likely reflects a chronic microhemorrhage. Vascular: Major vessel flow voids at the skull base are preserved. Skull and upper cervical spine: Normal marrow signal is preserved. Sinuses/Orbits: Paranasal sinuses are aerated. Orbits are unremarkable. Other: Sella is unremarkable.  Mastoid air  cells are clear. MRA HEAD Intracranial internal carotid arteries are patent. Middle and anterior cerebral arteries are patent. Intracranial vertebral arteries, basilar artery, posterior cerebral arteries are patent. There is no significant stenosis or aneurysm. MRA NECK Common, internal, and external carotid arteries are patent. There is no measurable stenosis at the ICA origin. Extracranial left vertebral artery is patent. The proximal extracranial right vertebral artery is not visualized. There is faint visualization of the mid to distal V2 segment and the V3 segment is patent. IMPRESSION: No evidence of recent infarction, hemorrhage, or mass. Moderate chronic microvascular ischemic changes and a few small chronic infarcts as described. Unremarkable MRA of the head. No measurable stenosis at the ICA origins. Likely chronic occlusion or high-grade stenosis of the proximal extracranial right vertebral artery with reconstitution in the neck more distally. Electronically Signed   By: Macy Mis M.D.   On: 08/25/2020 10:37    Procedures .Critical Care Performed by: Noemi Chapel, MD Authorized by: Noemi Chapel, MD   Critical care provider statement:    Critical care time (minutes):  35   Critical care time was exclusive of:  Separately billable procedures and treating other patients and teaching time   Critical care was necessary to treat or prevent imminent or life-threatening deterioration of the following conditions:  CNS failure or compromise   Critical care was time spent personally by me on the following activities:  Blood draw for specimens, development of treatment plan with patient or surrogate, discussions with consultants, evaluation of patient's response to treatment, examination of patient, obtaining history from patient or surrogate, ordering and performing treatments and interventions, ordering and review of laboratory studies, ordering and review of radiographic studies, pulse oximetry,  re-evaluation of patient's condition and review of old charts   (including critical care time)  Medications Ordered in ED Medications  amLODipine (NORVASC) tablet 10 mg (10 mg Oral Given 08/25/20 1342)  losartan (COZAAR) tablet 100 mg (100 mg Oral Given 08/25/20 1342)    And  hydrochlorothiazide (HYDRODIURIL) tablet 25 mg (25 mg Oral Given 08/25/20 1342)  labetalol (NORMODYNE) injection 10 mg (10 mg Intravenous Given 08/25/20 0833)  gadobutrol (GADAVIST) 1 MMOL/ML injection 8 mL (8 mLs Intravenous Contrast Given 08/25/20 1015)  prochlorperazine (COMPAZINE) injection 10 mg (10 mg Intravenous Given 08/25/20 1048)  ketorolac (TORADOL) 30 MG/ML injection 30 mg (30 mg Intravenous  Given 08/25/20 1048)  NIFEdipine (PROCARDIA XL/NIFEDICAL-XL) 24 hr tablet 90 mg (90 mg Oral Given 08/25/20 1342)    ED Course  I have reviewed the triage vital signs and the nursing notes.  Pertinent labs & imaging results that were available during my care of the patient were reviewed by me and considered in my medical decision making (see chart for details).  Clinical Course as of Aug 25 1438  Tue Aug 25, 2020  2841 Patient metabolic panel reveals that creatinine is slightly up from prior at 2.1-2.6, otherwise electrolytes are unremarkable with normal glycemia and blood counts are also unremarkable   [BM]    Clinical Course User Index [BM] Noemi Chapel, MD   MDM Rules/Calculators/A&P                          Severe hypertension of 249/160 Labetalol given, discussed the case with Dr. Leonel Ramsay at 8:05 AM, he recommends the patient get MR angiograms of the head and neck as well as an MRI of the head and brain if the CT scan is negative for hemorrhage.  This patient is critically ill with what appears to be an acute stroke, he is severely hypertensive requiring acute and immediate management of that blood pressure.  The pt has been seen by Dr. Leonel Ramsay who states that his neurological exam is not  consistent with stroke, additionally, the MRI, did not show ischemia nor did it show other causes of the patients sx - he does have a history of headaches and hypertension - he is not taking his meds in the last 6 or more weeks citing lack of resources for follow up -   I have given the pt treatment for migraine with compazine and toradol, he has had meds for BP and his home meds as well and he has improved - though his BP is still elevated most recently measured at 200/150,k it is better - home meds given now - will recheck - neuro has near completely resolved - speech is normal.  Last blood pressure measurement was 197/101, the patient is feeling much better, stable for discharge, medications prescribed to his pharmacy  Final Clinical Impression(s) / ED Diagnoses Final diagnoses:  Complicated migraine  Severe hypertension    Rx / DC Orders ED Discharge Orders         Ordered    amLODipine (NORVASC) 10 MG tablet  Daily        08/25/20 1438    atorvastatin (LIPITOR) 40 MG tablet  Daily-1800        08/25/20 1438    glipiZIDE (GLUCOTROL XL) 2.5 MG 24 hr tablet       Note to Pharmacy: **Patient requests 90 days supply**   08/25/20 1438    losartan-hydrochlorothiazide (HYZAAR) 100-25 MG tablet  Daily        08/25/20 1438    NIFEdipine (PROCARDIA XL/NIFEDICAL-XL) 90 MG 24 hr tablet  Daily        08/25/20 1438           Noemi Chapel, MD 08/25/20 1439

## 2020-08-25 NOTE — Discharge Instructions (Addendum)
Your testing has shown that you are not having a stroke, your blood pressure has significantly improved and the medication that we gave you for a headache has improved your symptoms and your blood pressure.  Please see your doctor within 2 weeks for recheck of your blood pressure.  I have called in a prescription for your blood pressure medication to be done to your pharmacy today.  If you do not have a family doctor to follow-up with them please [facility], I have given you their phone number above.  You will need to have your blood work rechecked to make sure that your kidneys are doing well.  You are on some medications that may affect your kidneys over time so this has to be rechecked within 1 week to make sure you are not developing worsening kidney failure  ER for worsening symptoms

## 2020-12-14 ENCOUNTER — Inpatient Hospital Stay (HOSPITAL_COMMUNITY): Payer: 59

## 2020-12-14 ENCOUNTER — Ambulatory Visit (INDEPENDENT_AMBULATORY_CARE_PROVIDER_SITE_OTHER): Payer: Medicare (Managed Care) | Admitting: Family Medicine

## 2020-12-14 ENCOUNTER — Encounter: Payer: Self-pay | Admitting: Family Medicine

## 2020-12-14 ENCOUNTER — Inpatient Hospital Stay (HOSPITAL_COMMUNITY)
Admission: EM | Admit: 2020-12-14 | Discharge: 2021-01-08 | DRG: 264 | Disposition: A | Discharge status: Skilled Nursing Facility | Payer: 59 | Source: Ambulatory Visit | Attending: Internal Medicine | Admitting: Internal Medicine

## 2020-12-14 ENCOUNTER — Other Ambulatory Visit: Payer: Self-pay

## 2020-12-14 ENCOUNTER — Emergency Department (HOSPITAL_COMMUNITY): Payer: 59

## 2020-12-14 VITALS — HR 92 | Resp 19

## 2020-12-14 DIAGNOSIS — R0989 Other specified symptoms and signs involving the circulatory and respiratory systems: Secondary | ICD-10-CM | POA: Diagnosis present

## 2020-12-14 DIAGNOSIS — R0602 Shortness of breath: Secondary | ICD-10-CM

## 2020-12-14 DIAGNOSIS — Z79899 Other long term (current) drug therapy: Secondary | ICD-10-CM

## 2020-12-14 DIAGNOSIS — D561 Beta thalassemia: Secondary | ICD-10-CM | POA: Diagnosis present

## 2020-12-14 DIAGNOSIS — I5043 Acute on chronic combined systolic (congestive) and diastolic (congestive) heart failure: Secondary | ICD-10-CM | POA: Diagnosis present

## 2020-12-14 DIAGNOSIS — I161 Hypertensive emergency: Secondary | ICD-10-CM | POA: Diagnosis present

## 2020-12-14 DIAGNOSIS — I10 Essential (primary) hypertension: Secondary | ICD-10-CM | POA: Diagnosis not present

## 2020-12-14 DIAGNOSIS — N184 Chronic kidney disease, stage 4 (severe): Secondary | ICD-10-CM | POA: Diagnosis present

## 2020-12-14 DIAGNOSIS — I471 Supraventricular tachycardia: Secondary | ICD-10-CM | POA: Diagnosis present

## 2020-12-14 DIAGNOSIS — F32A Depression, unspecified: Secondary | ICD-10-CM | POA: Diagnosis present

## 2020-12-14 DIAGNOSIS — I5021 Acute systolic (congestive) heart failure: Secondary | ICD-10-CM | POA: Diagnosis not present

## 2020-12-14 DIAGNOSIS — E1122 Type 2 diabetes mellitus with diabetic chronic kidney disease: Secondary | ICD-10-CM | POA: Diagnosis present

## 2020-12-14 DIAGNOSIS — I5033 Acute on chronic diastolic (congestive) heart failure: Secondary | ICD-10-CM

## 2020-12-14 DIAGNOSIS — Z20822 Contact with and (suspected) exposure to covid-19: Secondary | ICD-10-CM | POA: Diagnosis present

## 2020-12-14 DIAGNOSIS — F419 Anxiety disorder, unspecified: Secondary | ICD-10-CM | POA: Diagnosis present

## 2020-12-14 DIAGNOSIS — E876 Hypokalemia: Secondary | ICD-10-CM | POA: Diagnosis present

## 2020-12-14 DIAGNOSIS — E871 Hypo-osmolality and hyponatremia: Secondary | ICD-10-CM | POA: Diagnosis present

## 2020-12-14 DIAGNOSIS — E8889 Other specified metabolic disorders: Secondary | ICD-10-CM | POA: Diagnosis present

## 2020-12-14 DIAGNOSIS — Z419 Encounter for procedure for purposes other than remedying health state, unspecified: Secondary | ICD-10-CM

## 2020-12-14 DIAGNOSIS — R7989 Other specified abnormal findings of blood chemistry: Secondary | ICD-10-CM | POA: Diagnosis present

## 2020-12-14 DIAGNOSIS — E1165 Type 2 diabetes mellitus with hyperglycemia: Secondary | ICD-10-CM | POA: Diagnosis present

## 2020-12-14 DIAGNOSIS — K920 Hematemesis: Secondary | ICD-10-CM | POA: Diagnosis not present

## 2020-12-14 DIAGNOSIS — N185 Chronic kidney disease, stage 5: Secondary | ICD-10-CM | POA: Diagnosis not present

## 2020-12-14 DIAGNOSIS — I509 Heart failure, unspecified: Secondary | ICD-10-CM | POA: Diagnosis present

## 2020-12-14 DIAGNOSIS — I132 Hypertensive heart and chronic kidney disease with heart failure and with stage 5 chronic kidney disease, or end stage renal disease: Secondary | ICD-10-CM | POA: Diagnosis not present

## 2020-12-14 DIAGNOSIS — J9601 Acute respiratory failure with hypoxia: Secondary | ICD-10-CM | POA: Diagnosis present

## 2020-12-14 DIAGNOSIS — I2510 Atherosclerotic heart disease of native coronary artery without angina pectoris: Secondary | ICD-10-CM | POA: Diagnosis not present

## 2020-12-14 DIAGNOSIS — Z8673 Personal history of transient ischemic attack (TIA), and cerebral infarction without residual deficits: Secondary | ICD-10-CM

## 2020-12-14 DIAGNOSIS — I4891 Unspecified atrial fibrillation: Secondary | ICD-10-CM | POA: Diagnosis present

## 2020-12-14 DIAGNOSIS — E43 Unspecified severe protein-calorie malnutrition: Secondary | ICD-10-CM | POA: Diagnosis present

## 2020-12-14 DIAGNOSIS — N1832 Chronic kidney disease, stage 3b: Secondary | ICD-10-CM | POA: Diagnosis not present

## 2020-12-14 DIAGNOSIS — I998 Other disorder of circulatory system: Secondary | ICD-10-CM | POA: Diagnosis not present

## 2020-12-14 DIAGNOSIS — I5031 Acute diastolic (congestive) heart failure: Secondary | ICD-10-CM | POA: Diagnosis not present

## 2020-12-14 DIAGNOSIS — Z5329 Procedure and treatment not carried out because of patient's decision for other reasons: Secondary | ICD-10-CM | POA: Diagnosis not present

## 2020-12-14 DIAGNOSIS — N179 Acute kidney failure, unspecified: Secondary | ICD-10-CM | POA: Diagnosis present

## 2020-12-14 DIAGNOSIS — Z8249 Family history of ischemic heart disease and other diseases of the circulatory system: Secondary | ICD-10-CM

## 2020-12-14 DIAGNOSIS — N189 Chronic kidney disease, unspecified: Secondary | ICD-10-CM | POA: Diagnosis not present

## 2020-12-14 DIAGNOSIS — R9431 Abnormal electrocardiogram [ECG] [EKG]: Secondary | ICD-10-CM | POA: Diagnosis not present

## 2020-12-14 DIAGNOSIS — I130 Hypertensive heart and chronic kidney disease with heart failure and stage 1 through stage 4 chronic kidney disease, or unspecified chronic kidney disease: Principal | ICD-10-CM | POA: Diagnosis present

## 2020-12-14 DIAGNOSIS — Z992 Dependence on renal dialysis: Secondary | ICD-10-CM | POA: Diagnosis not present

## 2020-12-14 DIAGNOSIS — I429 Cardiomyopathy, unspecified: Secondary | ICD-10-CM | POA: Diagnosis present

## 2020-12-14 DIAGNOSIS — N1831 Chronic kidney disease, stage 3a: Secondary | ICD-10-CM | POA: Diagnosis not present

## 2020-12-14 DIAGNOSIS — E785 Hyperlipidemia, unspecified: Secondary | ICD-10-CM | POA: Diagnosis present

## 2020-12-14 DIAGNOSIS — I5042 Chronic combined systolic (congestive) and diastolic (congestive) heart failure: Secondary | ICD-10-CM | POA: Diagnosis not present

## 2020-12-14 DIAGNOSIS — M109 Gout, unspecified: Secondary | ICD-10-CM | POA: Diagnosis present

## 2020-12-14 DIAGNOSIS — Z7984 Long term (current) use of oral hypoglycemic drugs: Secondary | ICD-10-CM

## 2020-12-14 DIAGNOSIS — D631 Anemia in chronic kidney disease: Secondary | ICD-10-CM | POA: Diagnosis present

## 2020-12-14 DIAGNOSIS — N271 Small kidney, bilateral: Secondary | ICD-10-CM | POA: Diagnosis present

## 2020-12-14 DIAGNOSIS — Z6823 Body mass index (BMI) 23.0-23.9, adult: Secondary | ICD-10-CM

## 2020-12-14 DIAGNOSIS — I248 Other forms of acute ischemic heart disease: Secondary | ICD-10-CM | POA: Diagnosis present

## 2020-12-14 DIAGNOSIS — E119 Type 2 diabetes mellitus without complications: Secondary | ICD-10-CM | POA: Diagnosis not present

## 2020-12-14 DIAGNOSIS — Z823 Family history of stroke: Secondary | ICD-10-CM

## 2020-12-14 DIAGNOSIS — M069 Rheumatoid arthritis, unspecified: Secondary | ICD-10-CM | POA: Diagnosis present

## 2020-12-14 DIAGNOSIS — G894 Chronic pain syndrome: Secondary | ICD-10-CM | POA: Diagnosis not present

## 2020-12-14 DIAGNOSIS — R109 Unspecified abdominal pain: Secondary | ICD-10-CM

## 2020-12-14 DIAGNOSIS — N186 End stage renal disease: Secondary | ICD-10-CM

## 2020-12-14 DIAGNOSIS — D500 Iron deficiency anemia secondary to blood loss (chronic): Secondary | ICD-10-CM | POA: Diagnosis not present

## 2020-12-14 DIAGNOSIS — K5900 Constipation, unspecified: Secondary | ICD-10-CM | POA: Diagnosis not present

## 2020-12-14 DIAGNOSIS — I70229 Atherosclerosis of native arteries of extremities with rest pain, unspecified extremity: Secondary | ICD-10-CM | POA: Diagnosis not present

## 2020-12-14 DIAGNOSIS — I951 Orthostatic hypotension: Secondary | ICD-10-CM | POA: Diagnosis not present

## 2020-12-14 LAB — COMPREHENSIVE METABOLIC PANEL
ALT: 49 U/L — ABNORMAL HIGH (ref 0–44)
AST: 30 U/L (ref 15–41)
Albumin: 3.1 g/dL — ABNORMAL LOW (ref 3.5–5.0)
Alkaline Phosphatase: 84 U/L (ref 38–126)
Anion gap: 16 — ABNORMAL HIGH (ref 5–15)
BUN: 96 mg/dL — ABNORMAL HIGH (ref 8–23)
CO2: 24 mmol/L (ref 22–32)
Calcium: 8.4 mg/dL — ABNORMAL LOW (ref 8.9–10.3)
Chloride: 93 mmol/L — ABNORMAL LOW (ref 98–111)
Creatinine, Ser: 5.03 mg/dL — ABNORMAL HIGH (ref 0.61–1.24)
GFR, Estimated: 12 mL/min — ABNORMAL LOW (ref >60)
Glucose, Bld: 262 mg/dL — ABNORMAL HIGH (ref 70–99)
Potassium: 3.1 mmol/L — ABNORMAL LOW (ref 3.5–5.1)
Sodium: 133 mmol/L — ABNORMAL LOW (ref 135–145)
Total Bilirubin: 0.9 mg/dL (ref 0.3–1.2)
Total Protein: 6.5 g/dL (ref 6.5–8.1)

## 2020-12-14 LAB — CBC
HCT: 38.1 % — ABNORMAL LOW (ref 39.0–52.0)
Hemoglobin: 11.7 g/dL — ABNORMAL LOW (ref 13.0–17.0)
MCH: 23.4 pg — ABNORMAL LOW (ref 26.0–34.0)
MCHC: 30.7 g/dL (ref 30.0–36.0)
MCV: 76.4 fL — ABNORMAL LOW (ref 80.0–100.0)
Platelets: 200 10*3/uL (ref 150–400)
RBC: 4.99 MIL/uL (ref 4.22–5.81)
RDW: 16.9 % — ABNORMAL HIGH (ref 11.5–15.5)
WBC: 9.2 10*3/uL (ref 4.0–10.5)
nRBC: 0 % (ref 0.0–0.2)

## 2020-12-14 LAB — ECHOCARDIOGRAM COMPLETE
AR max vel: 2.63 cm2
AV Area VTI: 2.82 cm2
AV Area mean vel: 2.9 cm2
AV Mean grad: 2 mmHg
AV Peak grad: 3.3 mmHg
Ao pk vel: 0.91 m/s
Area-P 1/2: 4.29 cm2
Calc EF: 41.7 %
MV M vel: 6.06 m/s
MV Peak grad: 146.9 mmHg
MV VTI: 1.86 cm2
Radius: 0.5 cm
S' Lateral: 3.6 cm
Single Plane A2C EF: 44.1 %
Single Plane A4C EF: 41.2 %

## 2020-12-14 LAB — BRAIN NATRIURETIC PEPTIDE: B Natriuretic Peptide: 3171 pg/mL — ABNORMAL HIGH (ref 0.0–100.0)

## 2020-12-14 LAB — GLUCOSE, CAPILLARY: Glucose-Capillary: 205 mg/dL — ABNORMAL HIGH (ref 70–99)

## 2020-12-14 LAB — GLUCOSE, POCT (MANUAL RESULT ENTRY): POC Glucose: 324 mg/dl — AB (ref 70–99)

## 2020-12-14 LAB — CBG MONITORING, ED: Glucose-Capillary: 186 mg/dL — ABNORMAL HIGH (ref 70–99)

## 2020-12-14 LAB — D-DIMER, QUANTITATIVE: D-Dimer, Quant: 4.41 ug/mL-FEU — ABNORMAL HIGH (ref 0.00–0.50)

## 2020-12-14 LAB — RESP PANEL BY RT-PCR (FLU A&B, COVID) ARPGX2
Influenza A by PCR: NEGATIVE
Influenza B by PCR: NEGATIVE
SARS Coronavirus 2 by RT PCR: NEGATIVE

## 2020-12-14 LAB — TROPONIN I (HIGH SENSITIVITY)
Troponin I (High Sensitivity): 166 ng/L (ref <18)
Troponin I (High Sensitivity): 155 ng/L (ref <18)

## 2020-12-14 LAB — POC SARS CORONAVIRUS 2 AG -  ED: SARS Coronavirus 2 Ag: NEGATIVE

## 2020-12-14 MED ORDER — LABETALOL HCL 5 MG/ML IV SOLN
20.0000 mg | Freq: Once | INTRAVENOUS | Status: AC
  Administered 2020-12-14: 20 mg via INTRAVENOUS
  Filled 2020-12-14: qty 4

## 2020-12-14 MED ORDER — POTASSIUM CHLORIDE CRYS ER 20 MEQ PO TBCR
40.0000 meq | EXTENDED_RELEASE_TABLET | Freq: Once | ORAL | Status: AC
  Administered 2020-12-14: 40 meq via ORAL
  Filled 2020-12-14: qty 2

## 2020-12-14 MED ORDER — GUAIFENESIN-DM 100-10 MG/5ML PO SYRP
5.0000 mL | ORAL_SOLUTION | ORAL | Status: DC | PRN
  Administered 2020-12-15 – 2020-12-16 (×3): 5 mL via ORAL
  Filled 2020-12-14 (×3): qty 5

## 2020-12-14 MED ORDER — HYDRALAZINE HCL 10 MG PO TABS
10.0000 mg | ORAL_TABLET | Freq: Three times a day (TID) | ORAL | Status: DC
  Administered 2020-12-14 – 2020-12-15 (×3): 10 mg via ORAL
  Filled 2020-12-14 (×3): qty 1

## 2020-12-14 MED ORDER — AMLODIPINE BESYLATE 10 MG PO TABS
10.0000 mg | ORAL_TABLET | Freq: Every day | ORAL | Status: DC
  Administered 2020-12-14 – 2020-12-15 (×2): 10 mg via ORAL
  Filled 2020-12-14: qty 2
  Filled 2020-12-14: qty 1

## 2020-12-14 MED ORDER — IPRATROPIUM-ALBUTEROL 0.5-2.5 (3) MG/3ML IN SOLN: 3.0000 mL | Freq: Once | RESPIRATORY_TRACT | Status: DC

## 2020-12-14 MED ORDER — FUROSEMIDE 10 MG/ML IJ SOLN
40.0000 mg | Freq: Once | INTRAMUSCULAR | Status: AC
  Administered 2020-12-14: 40 mg via INTRAVENOUS
  Filled 2020-12-14: qty 4

## 2020-12-14 MED ORDER — ISOSORBIDE MONONITRATE ER 30 MG PO TB24
30.0000 mg | ORAL_TABLET | Freq: Every day | ORAL | Status: DC
  Administered 2020-12-15: 30 mg via ORAL
  Filled 2020-12-14: qty 1

## 2020-12-14 MED ORDER — PNEUMOCOCCAL VAC POLYVALENT 25 MCG/0.5ML IJ INJ
0.5000 mL | INJECTION | INTRAMUSCULAR | Status: DC
  Filled 2020-12-14: qty 0.5

## 2020-12-14 MED ORDER — ONDANSETRON HCL 4 MG/2ML IJ SOLN
4.0000 mg | Freq: Four times a day (QID) | INTRAMUSCULAR | Status: DC | PRN
  Administered 2020-12-16 – 2020-12-18 (×2): 4 mg via INTRAVENOUS
  Filled 2020-12-14 (×2): qty 2

## 2020-12-14 MED ORDER — SODIUM CHLORIDE 0.9% FLUSH
3.0000 mL | Freq: Two times a day (BID) | INTRAVENOUS | Status: DC
  Administered 2020-12-14 – 2021-01-08 (×49): 3 mL via INTRAVENOUS

## 2020-12-14 MED ORDER — IPRATROPIUM-ALBUTEROL 0.5-2.5 (3) MG/3ML IN SOLN
3.0000 mL | Freq: Once | RESPIRATORY_TRACT | Status: AC
  Administered 2020-12-14: 3 mL via RESPIRATORY_TRACT
  Filled 2020-12-14: qty 3

## 2020-12-14 MED ORDER — HYDRALAZINE HCL 25 MG PO TABS
25.0000 mg | ORAL_TABLET | Freq: Four times a day (QID) | ORAL | Status: DC | PRN
  Administered 2020-12-15: 25 mg via ORAL
  Filled 2020-12-14: qty 1

## 2020-12-14 MED ORDER — LABETALOL HCL 5 MG/ML IV SOLN
20.0000 mg | INTRAVENOUS | Status: DC | PRN
  Administered 2020-12-14 (×2): 20 mg via INTRAVENOUS
  Filled 2020-12-14 (×2): qty 4

## 2020-12-14 MED ORDER — SODIUM CHLORIDE 0.9% FLUSH: 3.0000 mL | INTRAVENOUS | Status: DC | PRN

## 2020-12-14 MED ORDER — INFLUENZA VAC SPLIT QUAD 0.5 ML IM SUSY
0.5000 mL | PREFILLED_SYRINGE | INTRAMUSCULAR | Status: DC
  Filled 2020-12-14 (×2): qty 0.5

## 2020-12-14 MED ORDER — INSULIN ASPART 100 UNIT/ML ~~LOC~~ SOLN
0.0000 [IU] | Freq: Three times a day (TID) | SUBCUTANEOUS | Status: DC
  Administered 2020-12-14 – 2020-12-15 (×2): 2 [IU] via SUBCUTANEOUS
  Administered 2020-12-15: 3 [IU] via SUBCUTANEOUS
  Administered 2020-12-16: 1 [IU] via SUBCUTANEOUS
  Administered 2020-12-16: 3 [IU] via SUBCUTANEOUS
  Administered 2020-12-16: 7 [IU] via SUBCUTANEOUS
  Administered 2020-12-17 (×2): 2 [IU] via SUBCUTANEOUS
  Administered 2020-12-17: 1 [IU] via SUBCUTANEOUS
  Administered 2020-12-18 (×3): 2 [IU] via SUBCUTANEOUS
  Administered 2020-12-19: 5 [IU] via SUBCUTANEOUS
  Administered 2020-12-19: 3 [IU] via SUBCUTANEOUS
  Administered 2020-12-19 – 2020-12-20 (×3): 2 [IU] via SUBCUTANEOUS
  Administered 2020-12-20 – 2020-12-21 (×2): 1 [IU] via SUBCUTANEOUS
  Administered 2020-12-22: 3 [IU] via SUBCUTANEOUS
  Administered 2020-12-22: 2 [IU] via SUBCUTANEOUS
  Administered 2020-12-22 – 2020-12-23 (×2): 1 [IU] via SUBCUTANEOUS
  Administered 2020-12-23: 2 [IU] via SUBCUTANEOUS
  Administered 2020-12-23: 1 [IU] via SUBCUTANEOUS
  Administered 2020-12-25: 2 [IU] via SUBCUTANEOUS
  Administered 2020-12-25 – 2020-12-28 (×7): 1 [IU] via SUBCUTANEOUS
  Administered 2020-12-29: 2 [IU] via SUBCUTANEOUS
  Administered 2020-12-30 (×2): 1 [IU] via SUBCUTANEOUS
  Administered 2021-01-01: 5 [IU] via SUBCUTANEOUS
  Administered 2021-01-01: 2 [IU] via SUBCUTANEOUS
  Administered 2021-01-02 – 2021-01-03 (×2): 1 [IU] via SUBCUTANEOUS
  Administered 2021-01-03: 2 [IU] via SUBCUTANEOUS
  Administered 2021-01-03: 1 [IU] via SUBCUTANEOUS
  Administered 2021-01-04: 2 [IU] via SUBCUTANEOUS
  Administered 2021-01-04: 1 [IU] via SUBCUTANEOUS
  Administered 2021-01-05: 3 [IU] via SUBCUTANEOUS
  Administered 2021-01-06: 2 [IU] via SUBCUTANEOUS
  Administered 2021-01-07: 1 [IU] via SUBCUTANEOUS
  Administered 2021-01-07: 2 [IU] via SUBCUTANEOUS
  Administered 2021-01-07 – 2021-01-08 (×2): 1 [IU] via SUBCUTANEOUS

## 2020-12-14 MED ORDER — ACETAMINOPHEN 325 MG PO TABS
650.0000 mg | ORAL_TABLET | ORAL | Status: DC | PRN
  Administered 2020-12-16 – 2021-01-07 (×3): 650 mg via ORAL
  Filled 2020-12-14 (×3): qty 2

## 2020-12-14 MED ORDER — NICARDIPINE HCL IN NACL 20-0.86 MG/200ML-% IV SOLN
3.0000 mg/h | INTRAVENOUS | Status: DC
  Administered 2020-12-14: 5 mg/h via INTRAVENOUS
  Filled 2020-12-14: qty 200

## 2020-12-14 MED ORDER — ASPIRIN EC 81 MG PO TBEC
81.0000 mg | DELAYED_RELEASE_TABLET | Freq: Every day | ORAL | Status: DC
  Administered 2020-12-14 – 2020-12-18 (×5): 81 mg via ORAL
  Filled 2020-12-14 (×5): qty 1

## 2020-12-14 MED ORDER — SODIUM CHLORIDE 0.9 % IV SOLN: 250.0000 mL | INTRAVENOUS | Status: DC | PRN

## 2020-12-14 MED ORDER — HEPARIN SODIUM (PORCINE) 5000 UNIT/ML IJ SOLN
5000.0000 [IU] | Freq: Two times a day (BID) | INTRAMUSCULAR | Status: DC
  Administered 2020-12-14 – 2020-12-18 (×9): 5000 [IU] via SUBCUTANEOUS
  Filled 2020-12-14 (×9): qty 1

## 2020-12-14 MED ORDER — ATORVASTATIN CALCIUM 40 MG PO TABS
40.0000 mg | ORAL_TABLET | Freq: Every day | ORAL | Status: DC
  Administered 2020-12-14 – 2021-01-08 (×26): 40 mg via ORAL
  Filled 2020-12-14 (×6): qty 1
  Filled 2020-12-14: qty 4
  Filled 2020-12-14 (×19): qty 1

## 2020-12-14 NOTE — ED Notes (Signed)
STEMI DR pgd [phone]

## 2020-12-14 NOTE — H&P (Addendum)
History and Physical    Ruben Camacho. [id number] DOB: 06-16-1956 DOA: 12/14/2020  PCP: Horald Pollen, MD (Confirm with patient/family/NH records and if not entered, this has to be entered at [facility] point of entry) Patient coming from: Home  I have personally briefly reviewed patient's old medical records in [place]  Chief Complaint: SOB  HPI: Ruben Camacho. is a 65 y.o. male with medical history significant of HTN, CKD stage IIIB, IIDM, HLD, presented with increasing shortness of breath.  Symptoms started about 10 to 14 days ago, gradually gotten worse, also has had dry cough and then became more productive in the last 2 to 3 days with whitish foamy sputum, no chest pain no fever chills.  Overnight unable to lie down, and unable to catch breath even at rest this morning. Then called EMS.  At baseline, check blood pressure every now and then at home and found most occasions SBP> 1 70-1 80, no recent BP medication adjustment.  Patient reported he has been compliant with his BP meds. EMS arrived, found patient blood pressure more than 200, glucose 320, O2 saturation 88% on room air then stabilized on 3 L via nasal cannula. ED Course: Pulmonary edema, blood pressure 250, Cardene drip started and BP dropped to 140s in 3 hours.  Blood work showed AKI on CKD creatinine 5.0 compared to 2.6 baseline, K3.1, glucose 262.  Troponin 166.  EKG LVH no acute ST-T changes.  Chest x-ray pulmonary edema.  Review of Systems: As per HPI otherwise 14 point review of systems negative.    Past Medical History:  Diagnosis Date  . Gout    1-2 exacerbations per year.  Takes Advil PRN for pain.  Marland Kitchen Hyperlipidemia   . Hypertension   . Migraine   . Stroke ([place]) 07/01/2012   L sided weakness, slurred speech.  Admission [facility].    . Vitamin D deficiency 08/09/2019    Past Surgical History:  Procedure Laterality Date  . Admission  07/01/2012   CVA (L sided weakness, slurred  speech).  [facility].  . COLONOSCOPY  10/31/2008   no polyps.  Repeat in 10 years.  [place].  Marland Kitchen HERNIA REPAIR       reports that he has never smoked. He has never used smokeless tobacco. He reports that he does not drink alcohol and does not use drugs.  No Known Allergies  Family History  Problem Relation Age of Onset  . Stroke Mother   . Hypertension Mother   . Hypertension Daughter   . Cancer Maternal Grandmother        lung  . Healthy Father        Died in motorcycle accident     Prior to Admission medications   Medication Sig Start Date End Date Taking? Authorizing Provider  amLODipine (NORVASC) 10 MG tablet Take 1 tablet (10 mg total) by mouth daily. 08/25/20  Yes Noemi Chapel, MD  aspirin 81 MG tablet Take 81 mg by mouth daily.   Yes [provider]  atorvastatin (LIPITOR) 40 MG tablet Take 1 tablet (40 mg total) by mouth daily at 6 PM. 08/25/20 11/23/20 Yes Noemi Chapel, MD  azithromycin (ZITHROMAX) 250 MG tablet Take 250-500 mg by mouth as directed. 12/10/20  Yes [provider]  diclofenac Sodium (VOLTAREN) 1 % GEL Apply 4 g topically 4 (four) times daily. 08/10/20   Deno Etienne, DO  glipiZIDE (GLUCOTROL XL) 2.5 MG 24 hr tablet TAKE 1 TABLET(2.5 MG) BY MOUTH DAILY  WITH BREAKFAST 08/25/20   Noemi Chapel, MD  glucose blood (PRECISION QID TEST) test strip Use as directed to check blood sugar 2 times a day before meals. 06/14/19   [provider]  Lancet Devices (SIMPLE DIAGNOSTICS LANCING DEV) MISC Use as directed to check blood sugar 2 times a day before meals. 06/14/19   [provider]  Lancets 30G MISC Use as directed to check blood sugar 2 times a day before meals. 06/14/19   [provider]  losartan-hydrochlorothiazide (HYZAAR) 100-25 MG tablet Take 1 tablet by mouth daily. 08/25/20 09/24/20  Noemi Chapel, MD  NIFEdipine (PROCARDIA XL/NIFEDICAL-XL) 90 MG 24 hr tablet Take 1 tablet (90 mg total) by mouth daily. 08/25/20 09/24/20  Noemi Chapel, MD  colchicine 0.6 MG tablet Take one pill an hour after your first dose in the ED Patient not taking: Reported on 08/25/2020 08/10/20 08/25/20  Deno Etienne, DO    Physical Exam: Vitals:   12/14/20 1245 12/14/20 1300 12/14/20 1315 12/14/20 1330  BP: (!) 143/82 (!) 141/78 128/69 136/71  Pulse: 74 71 (!) 59 71  Resp: (!) '31 17 19 '$ (!) 26  Temp:      TempSrc:      SpO2: 94% 95% 100% 99%    Constitutional: NAD, calm, comfortable Vitals:   12/14/20 1245 12/14/20 1300 12/14/20 1315 12/14/20 1330  BP: (!) 143/82 (!) 141/78 128/69 136/71  Pulse: 74 71 (!) 59 71  Resp: (!) '31 17 19 '$ (!) 26  Temp:      TempSrc:      SpO2: 94% 95% 100% 99%   Eyes: PERRL, lids and conjunctivae normal ENMT: Mucous membranes are moist. Posterior pharynx clear of any exudate or lesions.Normal dentition.  Neck: normal, supple, no masses, no thyromegaly, JVD 7 to 8 cm above clavicle Respiratory: clear to auscultation bilaterally, no wheezing, fine crackles to the mid level bilaterally crackles.  Talking in broken sentences and increasing respiratory effort. No accessory muscle use.  Cardiovascular: Regular rate and rhythm, no murmurs / rubs / gallops.  2+ extremity edema. 2+ pedal pulses. No carotid bruits.  Abdomen: no tenderness, no masses palpated. No hepatosplenomegaly. Bowel sounds positive.  Musculoskeletal: no clubbing / cyanosis. No joint deformity upper and lower extremities. Good ROM, no contractures. Normal muscle tone.  Skin: no rashes, lesions, ulcers. No induration Neurologic: CN 2-12 grossly intact. Sensation intact, DTR normal. Strength 5/5 in all 4.  Psychiatric: Normal judgment and insight. Alert and oriented x 3. Normal mood.    Labs on Admission: I have personally reviewed following labs and imaging studies  CBC: Recent Labs  Lab 12/14/20 0952  WBC 9.2  HGB 11.7*  HCT 38.1*  MCV 76.4*  PLT [id number]   Basic Metabolic Panel: Recent Labs  Lab 12/14/20 0952  NA 133*  K 3.1*  CL  93*  CO2 24  GLUCOSE 262*  BUN 96*  CREATININE 5.03*  CALCIUM 8.4*   GFR: CrCl cannot be calculated (Unknown ideal weight.). Liver Function Tests: Recent Labs  Lab 12/14/20 0952  AST 30  ALT 49*  ALKPHOS 84  BILITOT 0.9  PROT 6.5  ALBUMIN 3.1*   No results for input(s): LIPASE, AMYLASE in the last 168 hours. No results for input(s): AMMONIA in the last 168 hours. Coagulation Profile: No results for input(s): INR, PROTIME in the last 168 hours. Cardiac Enzymes: No results for input(s): CKTOTAL, CKMB, CKMBINDEX, TROPONINI in the last 168 hours. BNP (last 3 results) No results for input(s): PROBNP in the  last 8760 hours. HbA1C: No results for input(s): HGBA1C in the last 72 hours. CBG: No results for input(s): GLUCAP in the last 168 hours. Lipid Profile: No results for input(s): CHOL, HDL, LDLCALC, TRIG, CHOLHDL, LDLDIRECT in the last 72 hours. Thyroid Function Tests: No results for input(s): TSH, T4TOTAL, FREET4, T3FREE, THYROIDAB in the last 72 hours. Anemia Panel: No results for input(s): VITAMINB12, FOLATE, FERRITIN, TIBC, IRON, RETICCTPCT in the last 72 hours. Urine analysis:    Component Value Date/Time   COLORURINE YELLOW 08/25/2020 1215   APPEARANCEUR HAZY (A) 08/25/2020 1215   LABSPEC 1.028 08/25/2020 1215   PHURINE 5.0 08/25/2020 1215   GLUCOSEU NEGATIVE 08/25/2020 1215   HGBUR NEGATIVE 08/25/2020 [place] 08/25/2020 1215   BILIRUBINUR negative 09/04/2018 1030   KETONESUR NEGATIVE 08/25/2020 1215   PROTEINUR >=300 (A) 08/25/2020 1215   UROBILINOGEN 0.2 09/04/2018 1030   UROBILINOGEN 1.0 12/08/2014 1035   NITRITE NEGATIVE 08/25/2020 1215   LEUKOCYTESUR NEGATIVE 08/25/2020 1215    Radiological Exams on Admission: DG Chest Port 1 View  Result Date: 12/14/2020 CLINICAL DATA:  Shortness of breath, cough, hypertensive EXAM: PORTABLE CHEST 1 VIEW COMPARISON:  12/16/2015 FINDINGS: Cardiomegaly. Bilateral airspace disease, right greater  than left. No visible effusions or pneumothorax. No acute bony abnormality. IMPRESSION: Bilateral airspace disease, right greater than left, favor multifocal pneumonia. Electronically Signed   By: Rolm Baptise M.D.   On: 12/14/2020 10:01    EKG: Independently reviewed. LVH  Assessment/Plan Active Problems:   CHF (congestive heart failure) ([place])  (please populate well all problems here in Problem List. (For example, if patient is on BP meds at home and you resume or decide to hold them, it is a problem that needs to be her. Same for CAD, COPD, HLD and so on)  Acute on chronic diastolic CHF decompensation secondary to hypertensive urgency -Discussed with ED physician, appeared that Cardene drip dropped BP too fast, will discontinue Cardene drip. -Minott stabilized on current BP levels, with as needed hydralazine, -Continue amlodipine, hold his ARB and HCTZ and nifedipine. -Start hydralazine and Imdur regimen -Lasix 40 mg x 1 and repeat chest x-ray tomorrow, may need further diuresis.  Hypertension emergency -As above  Positive troponins -Likely demand ischemia from HTN emergency and CHF decompensation as well as AKI. -No significant ST changes on EKG, will trend troponins and ordered echocardiogram, cardiology on board. -No chest pain,, will not start ACS meds. -Continue aspirin and statin.  AKI on CKD stage IIIb -Fluid overload -Diuresis with caution and repeat chest x-ray tomorrow to decide further diuresis plan.  Elevated D-Dimer -No significant PE risk, likely reflect acute cardio-pulmonary changes -Repeat one reading in AM to determine further image study.  Hypokalemia -Replace and recheck, check magnesium as well.  IIDM with hyperglycemia -Hold diabetic pills, start sliding scale  DVT prophylaxis: Heparin subcu Code Status: Full code Family Communication: None at bedside Disposition Plan: Expect more than 2 midnight hospital stay to treat CHF decompensation and  uncontrolled hypertension Consults called: Cardiology Admission status: PCU  Lequita Halt MD [facility] Pager [phone]  12/14/2020, 1:39 PM

## 2020-12-14 NOTE — Progress Notes (Signed)
  Echocardiogram 2D Echocardiogram has been performed.  Ruben Camacho 12/14/2020, 4:30 PM

## 2020-12-14 NOTE — Progress Notes (Signed)
2/14/20225:27 PM  Ruben Camacho. 1956/01/03, 65 y.o., male [id number]  Chief complaint: SOB  HPI:   Patient is a 65 y.o. male with past medical history significant for CKD, DM, HTN who presents today for SOB.  Arrives to clinic at 8am due to SOB Was scheduled for a 1:20pm appointment Verbalizes sob has gotten worse throughout the weekend Audible wheezing noted and is only able to speak a few words at a time Extreme weakness noted  Tested negative for covid over the weekend Sats 88% on arrival: placed on nasal cannula Was given albuterol ipertropium nebulizer: wheezing improved post admin SOB continued Sats> 95 on 2 Liters nasal cannula Attempted to get labs, unable to other than glucose finger stick: 324    Depression screen [facility] 2/9 10/14/2019 08/06/2019 12/06/2018  Decreased Interest 0 0 0  Down, Depressed, Hopeless 0 0 0  PHQ - 2 Score 0 0 0    Fall Risk  10/14/2019 08/06/2019 12/06/2018 09/04/2018 01/19/2018  Falls in the past year? 0 0 0 0 No  Number falls in past yr: 0 0 - - -  Injury with Fall? 0 0 0 - -     No Known Allergies  Prior to Admission medications   Medication Sig Start Date End Date Taking? Authorizing Provider  amLODipine (NORVASC) 10 MG tablet Take 1 tablet (10 mg total) by mouth daily. 08/25/20   Noemi Chapel, MD  aspirin 81 MG tablet Take 81 mg by mouth daily.    [provider]  atorvastatin (LIPITOR) 40 MG tablet Take 1 tablet (40 mg total) by mouth daily at 6 PM. 08/25/20 11/23/20  Noemi Chapel, MD  azithromycin (ZITHROMAX) 250 MG tablet Take 250-500 mg by mouth as directed. 12/10/20   [provider]  diclofenac Sodium (VOLTAREN) 1 % GEL Apply 4 g topically 4 (four) times daily. Patient taking differently: Apply 4 g topically 4 (four) times daily as needed (pain). 08/10/20   Deno Etienne, DO  glipiZIDE (GLUCOTROL XL) 2.5 MG 24 hr tablet TAKE 1 TABLET(2.5 MG) BY MOUTH DAILY WITH BREAKFAST Patient not taking: No sig reported  08/25/20   Noemi Chapel, MD  glucose blood (PRECISION QID TEST) test strip Use as directed to check blood sugar 2 times a day before meals. 06/14/19   [provider]  Lancet Devices (SIMPLE DIAGNOSTICS LANCING DEV) MISC Use as directed to check blood sugar 2 times a day before meals. 06/14/19   [provider]  Lancets 30G MISC Use as directed to check blood sugar 2 times a day before meals. 06/14/19   [provider]  losartan-hydrochlorothiazide (HYZAAR) 100-25 MG tablet Take 1 tablet by mouth daily. 08/25/20 09/24/20  Noemi Chapel, MD  NIFEdipine (PROCARDIA XL/NIFEDICAL-XL) 90 MG 24 hr tablet Take 1 tablet (90 mg total) by mouth daily. 08/25/20 09/24/20  Noemi Chapel, MD  colchicine 0.6 MG tablet Take one pill an hour after your first dose in the ED Patient not taking: Reported on 08/25/2020 08/10/20 08/25/20  Deno Etienne, DO    Past Medical History:  Diagnosis Date  . Gout    1-2 exacerbations per year.  Takes Advil PRN for pain.  Marland Kitchen Hyperlipidemia   . Hypertension   . Migraine   . Stroke ([place]) 07/01/2012   L sided weakness, slurred speech.  Admission [facility].    . Vitamin D deficiency 08/09/2019    Past Surgical History:  Procedure Laterality Date  . Admission  07/01/2012   CVA (L sided weakness,  slurred speech).  [facility].  . COLONOSCOPY  10/31/2008   no polyps.  Repeat in 10 years.  [place].  Marland Kitchen HERNIA REPAIR      Social History   Tobacco Use  . Smoking status: Never Smoker  . Smokeless tobacco: Never Used  Substance Use Topics  . Alcohol use: No    Alcohol/week: 0.0 standard drinks    Comment: former drinker    Family History  Problem Relation Age of Onset  . Stroke Mother   . Hypertension Mother   . Hypertension Daughter   . Cancer Maternal Grandmother        lung  . Healthy Father        Died in motorcycle accident    Review of Systems  Constitutional: Positive for malaise/fatigue. Negative for chills and fever.  HENT: Negative  for sore throat.   Respiratory: Positive for shortness of breath and wheezing. Negative for cough.   Cardiovascular: Positive for leg swelling. Negative for chest pain and palpitations.  Genitourinary: Negative for frequency and urgency.  Neurological: Positive for weakness. Negative for dizziness and headaches.     OBJECTIVE:  Today's Vitals   12/14/20 0902 12/14/20 0903  Pulse: (!) 105 92  Resp: (!) 24 19  SpO2: (!) 88% 98%   There is no height or weight on file to calculate BMI.   Physical Exam Vitals reviewed.  Constitutional:      General: He is in acute distress.     Appearance: He is ill-appearing.  HENT:     Head: Normocephalic and atraumatic.  Cardiovascular:     Rate and Rhythm: Regular rhythm. Tachycardia present.     Pulses: Normal pulses.     Heart sounds: No murmur heard. No friction rub. No gallop.   Pulmonary:     Effort: Tachypnea and respiratory distress present.     Breath sounds: Decreased air movement present. No stridor. Wheezing present. No rales.  Abdominal:     General: Bowel sounds are normal. There is no distension.     Palpations: Abdomen is soft.     Tenderness: There is no abdominal tenderness. There is no guarding.  Musculoskeletal:     Right lower leg: Edema present.     Left lower leg: Edema present.  Skin:    General: Skin is warm and dry.  Neurological:     General: No focal deficit present.     Mental Status: He is alert and oriented to person, place, and time.     Motor: Weakness present.  Psychiatric:        Mood and Affect: Mood normal.        Behavior: Behavior normal.     Results for orders placed or performed during the hospital encounter of 12/14/20 (from the past 24 hour(s))  D-dimer, quantitative (not at [facility])     Status: Abnormal   Collection Time: 12/14/20  9:42 AM  Result Value Ref Range   D-Dimer, Quant 4.41 (H) 0.00 - 0.50 ug/mL-FEU  Comprehensive metabolic panel     Status: Abnormal   Collection Time:  12/14/20  9:52 AM  Result Value Ref Range   Sodium 133 (L) 135 - 145 mmol/L   Potassium 3.1 (L) 3.5 - 5.1 mmol/L   Chloride 93 (L) 98 - 111 mmol/L   CO2 24 22 - 32 mmol/L   Glucose, Bld 262 (H) 70 - 99 mg/dL   BUN 96 (H) 8 - 23 mg/dL   Creatinine, Ser 5.03 (H) 0.61 - 1.24  mg/dL   Calcium 8.4 (L) 8.9 - 10.3 mg/dL   Total Protein 6.5 6.5 - 8.1 g/dL   Albumin 3.1 (L) 3.5 - 5.0 g/dL   AST 30 15 - 41 U/L   ALT 49 (H) 0 - 44 U/L   Alkaline Phosphatase 84 38 - 126 U/L   Total Bilirubin 0.9 0.3 - 1.2 mg/dL   GFR, Estimated 12 (L) >60 mL/min   Anion gap 16 (H) 5 - 15  CBC     Status: Abnormal   Collection Time: 12/14/20  9:52 AM  Result Value Ref Range   WBC 9.2 4.0 - 10.5 K/uL   RBC 4.99 4.22 - 5.81 MIL/uL   Hemoglobin 11.7 (L) 13.0 - 17.0 g/dL   HCT 38.1 (L) 39.0 - 52.0 %   MCV 76.4 (L) 80.0 - 100.0 fL   MCH 23.4 (L) 26.0 - 34.0 pg   MCHC 30.7 30.0 - 36.0 g/dL   RDW 16.9 (H) 11.5 - 15.5 %   Platelets 200 150 - 400 K/uL   nRBC 0.0 0.0 - 0.2 %  Brain natriuretic peptide     Status: Abnormal   Collection Time: 12/14/20  9:52 AM  Result Value Ref Range   B Natriuretic Peptide 3,171.0 (H) 0.0 - 100.0 pg/mL  Troponin I (High Sensitivity)     Status: Abnormal   Collection Time: 12/14/20  9:52 AM  Result Value Ref Range   Troponin I (High Sensitivity) 166 (HH) <18 ng/L  POC SARS Coronavirus 2 Ag-ED - Nasal Swab     Status: None   Collection Time: 12/14/20 10:43 AM  Result Value Ref Range   SARS Coronavirus 2 Ag NEGATIVE NEGATIVE  Resp Panel by RT-PCR (Flu A&B, Covid) Nasopharyngeal Swab     Status: None   Collection Time: 12/14/20 11:00 AM   Specimen: Nasopharyngeal Swab; Nasopharyngeal(NP) swabs in vial transport medium  Result Value Ref Range   SARS Coronavirus 2 by RT PCR NEGATIVE NEGATIVE   Influenza A by PCR NEGATIVE NEGATIVE   Influenza B by PCR NEGATIVE NEGATIVE  Troponin I (High Sensitivity)     Status: Abnormal   Collection Time: 12/14/20 11:39 AM  Result Value Ref Range    Troponin I (High Sensitivity) 155 (HH) <18 ng/L    DG Chest Port 1 View  Result Date: 12/14/2020 CLINICAL DATA:  Shortness of breath, cough, hypertensive EXAM: PORTABLE CHEST 1 VIEW COMPARISON:  12/16/2015 FINDINGS: Cardiomegaly. Bilateral airspace disease, right greater than left. No visible effusions or pneumothorax. No acute bony abnormality. IMPRESSION: Bilateral airspace disease, right greater than left, favor multifocal pneumonia. Electronically Signed   By: Rolm Baptise M.D.   On: 12/14/2020 10:01     ASSESSMENT and PLAN  Problem List Items Addressed This Visit      Endocrine   Type 2 diabetes mellitus without complication, without long-term current use of insulin (HCC)     Genitourinary   Stage 3b chronic kidney disease ([place])    Other Visit Diagnoses    SOB (shortness of breath)    -  Primary   Relevant Medications   ipratropium-albuterol (DUONEB) 0.5-2.5 (3) MG/3ML nebulizer solution 3 mL   Other Relevant Orders   POCT glucose (manual entry) (Completed)     Plan . 911 called immediately upon arrival after assessment . Placed on Oxygen via nasal cannula . Nebulizer administered . Daughter notified of placement . Care transferred to EMS   Return for [place] ED visit.    Huston Foley Mitsue Peery, FNP-BC Primary  [facility] at [facility], [place] [address] Ph.  [phone] Fax [phone]

## 2020-12-14 NOTE — ED Provider Notes (Addendum)
[facility] EMERGENCY DEPARTMENT Provider Note   CSN: [id number] Arrival date & time: 12/14/20  [id number]     History Chief Complaint  Patient presents with  . Shortness of Breath    Ruben Camacho. is a 66 y.o. male who presents via EMS from urgent care for shortness of breath.  Patient presented to urgent care today and was found to be hypoxic to 88% on room air reduced ministered DuoNeb with improvement of saturations to 90%.  He has no 3 L of oxygen by nasal cannula. Patient has been vaccinated for COVID-19 x2, tested negative for Covid 2 days ago.  He endorses history of shortness of breath and generalized weakness for approximately 5 days, however shortness of breath significantly worsened today.  He denies any chest pain, palpitations, abdominal pain, nausea, vomiting, diarrhea, congestion, loss of taste or smell, headaches, blurry vision, double vision.  At time of my exam, patient has increased work of breathing but is maintaining oxygen saturation of 98 to 100% on 3 L of supplemental oxygen by nasal cannula.    I have personally reviewed this patient's medical records.  He has history of CKD, type 2 diabetes, hypertension, hyperlipidemia, history of CVA with left-sided residual weakness, and beta thalassemia.  Patient is not on any anticoagulation aside from aspirin.   Additionally patient has history of admission to the hospital in August 2020 for concern for hypertensive urgency with AKI.  At that time he had echocardiogram with normal ejection fraction, and equivocal stress test.  HPI     Past Medical History:  Diagnosis Date  . Gout    1-2 exacerbations per year.  Takes Advil PRN for pain.  Marland Kitchen Hyperlipidemia   . Hypertension   . Migraine   . Stroke ([place]) 07/01/2012   L sided weakness, slurred speech.  Admission [facility].    . Vitamin D deficiency 08/09/2019    Patient Active Problem List   Diagnosis Date Noted  . CHF (congestive heart failure)  ([place]) 12/14/2020  . Hypertensive emergency   . Stage 3b chronic kidney disease ([place]) 10/14/2019  . Vitamin D deficiency 08/09/2019  . Type 2 diabetes mellitus without complication, without long-term current use of insulin ([place]) 07/25/2019  . AKI (acute kidney injury) ([place]) 06/11/2019  . Hypertension associated with chronic kidney disease due to type 2 diabetes mellitus ([place]) 11/30/2011  . Hyperlipemia 11/30/2011  . Gout 11/30/2011  . Beta thalassemia ([place]) 11/30/2011    Past Surgical History:  Procedure Laterality Date  . Admission  07/01/2012   CVA (L sided weakness, slurred speech).  [facility].  . COLONOSCOPY  10/31/2008   no polyps.  Repeat in 10 years.  [place].  Marland Kitchen HERNIA REPAIR         Family History  Problem Relation Age of Onset  . Stroke Mother   . Hypertension Mother   . Hypertension Daughter   . Cancer Maternal Grandmother        lung  . Healthy Father        Died in motorcycle accident    Social History   Tobacco Use  . Smoking status: Never Smoker  . Smokeless tobacco: Never Used  Substance Use Topics  . Alcohol use: No    Alcohol/week: 0.0 standard drinks    Comment: former drinker  . Drug use: No    Home Medications Prior to Admission medications   Medication Sig Start Date End Date Taking? Authorizing Provider  amLODipine (NORVASC) 10 MG tablet Take  1 tablet (10 mg total) by mouth daily. 08/25/20  Yes Noemi Chapel, MD  aspirin 81 MG tablet Take 81 mg by mouth daily.   Yes [provider]  atorvastatin (LIPITOR) 40 MG tablet Take 1 tablet (40 mg total) by mouth daily at 6 PM. 08/25/20 11/23/20 Yes Noemi Chapel, MD  azithromycin (ZITHROMAX) 250 MG tablet Take 250-500 mg by mouth as directed. 12/10/20  Yes [provider]  diclofenac Sodium (VOLTAREN) 1 % GEL Apply 4 g topically 4 (four) times daily. Patient taking differently: Apply 4 g topically 4 (four) times daily as needed (pain). 08/10/20  Yes Deno Etienne, DO  glipiZIDE (GLUCOTROL XL)  2.5 MG 24 hr tablet TAKE 1 TABLET(2.5 MG) BY MOUTH DAILY WITH BREAKFAST Patient not taking: No sig reported 08/25/20   Noemi Chapel, MD  glucose blood (PRECISION QID TEST) test strip Use as directed to check blood sugar 2 times a day before meals. 06/14/19   [provider]  Lancet Devices (SIMPLE DIAGNOSTICS LANCING DEV) MISC Use as directed to check blood sugar 2 times a day before meals. 06/14/19   [provider]  Lancets 30G MISC Use as directed to check blood sugar 2 times a day before meals. 06/14/19   [provider]  losartan-hydrochlorothiazide (HYZAAR) 100-25 MG tablet Take 1 tablet by mouth daily. 08/25/20 09/24/20  Noemi Chapel, MD  NIFEdipine (PROCARDIA XL/NIFEDICAL-XL) 90 MG 24 hr tablet Take 1 tablet (90 mg total) by mouth daily. 08/25/20 09/24/20  Noemi Chapel, MD  colchicine 0.6 MG tablet Take one pill an hour after your first dose in the ED Patient not taking: Reported on 08/25/2020 08/10/20 08/25/20  Deno Etienne, DO    Allergies    Patient has no known allergies.  Review of Systems   Review of Systems  Constitutional: Positive for activity change and fatigue. Negative for appetite change, chills and fever.  HENT: Negative.   Eyes: Negative.   Respiratory: Positive for cough, chest tightness and shortness of breath.   Cardiovascular: Positive for leg swelling. Negative for chest pain and palpitations.  Gastrointestinal: Negative.  Negative for abdominal pain, diarrhea, nausea and vomiting.  Genitourinary: Negative.   Musculoskeletal: Negative.   Skin: Negative.   Neurological: Positive for weakness. Negative for dizziness, syncope, light-headedness and headaches.  Hematological: Negative.     Physical Exam Updated Vital Signs BP 136/71   Pulse 71   Temp 98.1 F (36.7 C) (Oral)   Resp (!) 26   SpO2 99%   Physical Exam Vitals and nursing note reviewed.  Constitutional:      Appearance: He is overweight. He is ill-appearing.      Interventions: Nasal cannula in place.  HENT:     Head: Normocephalic and atraumatic.     Nose: Nose normal.     Mouth/Throat:     Mouth: Mucous membranes are moist.     Pharynx: Oropharynx is clear. Uvula midline. No oropharyngeal exudate or posterior oropharyngeal erythema.     Tonsils: No tonsillar exudate.  Eyes:     General: Lids are normal. Vision grossly intact.        Right eye: No discharge.        Left eye: No discharge.     Extraocular Movements: Extraocular movements intact.     Conjunctiva/sclera: Conjunctivae normal.     Pupils: Pupils are equal, round, and reactive to light.  Neck:     Trachea: Trachea normal.  Cardiovascular:     Rate and Rhythm: Normal rate  and regular rhythm.     Pulses:          Radial pulses are 2+ on the right side and 2+ on the left side.       Dorsalis pedis pulses are 1+ on the right side and 1+ on the left side.     Heart sounds: Normal heart sounds. No murmur heard.   Pulmonary:     Effort: Tachypnea and accessory muscle usage present. No respiratory distress or retractions.     Breath sounds: Examination of the right-lower field reveals rales. Examination of the left-lower field reveals rales. Rales present. No wheezing.     Comments: Coarse breath sounds throughout Chest:     Chest wall: No deformity, swelling, tenderness, crepitus or edema.  Abdominal:     General: Bowel sounds are normal. There is no distension.     Palpations: Abdomen is soft.     Tenderness: There is no abdominal tenderness.  Musculoskeletal:        General: No deformity.     Cervical back: Neck supple. No rigidity or crepitus. No pain with movement or spinous process tenderness.     Right lower leg: 1+ Edema present.     Left lower leg: 1+ Edema present.  Lymphadenopathy:     Cervical: No cervical adenopathy.  Skin:    General: Skin is warm and dry.  Neurological:     Mental Status: He is alert and oriented to person, place, and time. Mental status is at  baseline.     Cranial Nerves: Cranial nerves are intact.     Sensory: Sensation is intact.     Motor: Motor function is intact.  Psychiatric:        Mood and Affect: Mood normal.     ED Results / Procedures / Treatments   Labs (all labs ordered are listed, but only abnormal results are displayed) Labs Reviewed  COMPREHENSIVE METABOLIC PANEL - Abnormal; Notable for the following components:      Result Value   Sodium 133 (*)    Potassium 3.1 (*)    Chloride 93 (*)    Glucose, Bld 262 (*)    BUN 96 (*)    Creatinine, Ser 5.03 (*)    Calcium 8.4 (*)    Albumin 3.1 (*)    ALT 49 (*)    GFR, Estimated 12 (*)    Anion gap 16 (*)    All other components within normal limits  CBC - Abnormal; Notable for the following components:   Hemoglobin 11.7 (*)    HCT 38.1 (*)    MCV 76.4 (*)    MCH 23.4 (*)    RDW 16.9 (*)    All other components within normal limits  BRAIN NATRIURETIC PEPTIDE - Abnormal; Notable for the following components:   B Natriuretic Peptide 3,171.0 (*)    All other components within normal limits  D-DIMER, QUANTITATIVE (NOT AT [facility]) - Abnormal; Notable for the following components:   D-Dimer, Quant 4.41 (*)    All other components within normal limits  TROPONIN I (HIGH SENSITIVITY) - Abnormal; Notable for the following components:   Troponin I (High Sensitivity) 166 (*)    All other components within normal limits  TROPONIN I (HIGH SENSITIVITY) - Abnormal; Notable for the following components:   Troponin I (High Sensitivity) 155 (*)    All other components within normal limits  RESP PANEL BY RT-PCR (FLU A&B, COVID) ARPGX2  HIV ANTIBODY (ROUTINE TESTING W REFLEX)  HEMOGLOBIN A1C  MAGNESIUM  POC SARS CORONAVIRUS 2 AG -  ED    EKG EKG Interpretation  Date/Time:  Monday December 14 2020 09:27:47 EST Ventricular Rate:  92 PR Interval:    QRS Duration: 83 QT Interval:  400 QTC Calculation: 495 R Axis:   4 Text Interpretation: Sinus rhythm  Supraventricular bigeminy Probable left atrial enlargement Abnormal R-wave progression, late transition Left ventricular hypertrophy Abnormal T, consider ischemia, lateral leads ST elevation, consider anterolateral injury Since last tracing ST elevation consider anterolateral injury or acute infarct Confirmed by Daleen Bo [phone]) on 12/14/2020 9:46:24 AM   Radiology DG Chest Port 1 View  Result Date: 12/14/2020 CLINICAL DATA:  Shortness of breath, cough, hypertensive EXAM: PORTABLE CHEST 1 VIEW COMPARISON:  12/16/2015 FINDINGS: Cardiomegaly. Bilateral airspace disease, right greater than left. No visible effusions or pneumothorax. No acute bony abnormality. IMPRESSION: Bilateral airspace disease, right greater than left, favor multifocal pneumonia. Electronically Signed   By: Rolm Baptise M.D.   On: 12/14/2020 10:01    Procedures .Critical Care Performed by: Emeline Darling, PA-C Authorized by: Emeline Darling, PA-C   Critical care provider statement:    Critical care time (minutes):  45   Critical care was necessary to treat or prevent imminent or life-threatening deterioration of the following conditions: hypertensive emergency.   Critical care was time spent personally by me on the following activities:  Discussions with consultants, evaluation of patient's response to treatment, examination of patient, ordering and performing treatments and interventions, ordering and review of laboratory studies, ordering and review of radiographic studies, pulse oximetry, re-evaluation of patient's condition, obtaining history from patient or surrogate and review of old charts   Care discussed with: admitting provider       Medications Ordered in ED Medications  labetalol (NORMODYNE) injection 20 mg (20 mg Intravenous Given 12/14/20 1113)  aspirin EC tablet 81 mg (has no administration in time range)  amLODipine (NORVASC) tablet 10 mg (has no administration in time range)  atorvastatin  (LIPITOR) tablet 40 mg (has no administration in time range)  furosemide (LASIX) injection 40 mg (has no administration in time range)  ipratropium-albuterol (DUONEB) 0.5-2.5 (3) MG/3ML nebulizer solution 3 mL (has no administration in time range)  hydrALAZINE (APRESOLINE) tablet 10 mg (has no administration in time range)  isosorbide mononitrate (IMDUR) 24 hr tablet 30 mg (has no administration in time range)  hydrALAZINE (APRESOLINE) tablet 25 mg (has no administration in time range)  sodium chloride flush (NS) 0.9 % injection 3 mL (has no administration in time range)  sodium chloride flush (NS) 0.9 % injection 3 mL (has no administration in time range)  0.9 %  sodium chloride infusion (has no administration in time range)  acetaminophen (TYLENOL) tablet 650 mg (has no administration in time range)  ondansetron (ZOFRAN) injection 4 mg (has no administration in time range)  heparin injection 5,000 Units (has no administration in time range)  insulin aspart (novoLOG) injection 0-9 Units (has no administration in time range)  potassium chloride SA (KLOR-CON) CR tablet 40 mEq (has no administration in time range)  labetalol (NORMODYNE) injection 20 mg (20 mg Intravenous Given 12/14/20 [id number])    ED Course  I have reviewed the triage vital signs and the nursing notes.  Pertinent labs & imaging results that were available during my care of the patient were reviewed by me and considered in my medical decision making (see chart for details).  Clinical Course as of 12/14/20 1356  Mon Dec 14, 2020  [id number] I have requested callback from the interventional cardiologist regarding ST elevation on the EKG [EW]  0949 Case discussed with Dr. Irish Lack, interventionalist, who does not feel this case currently meets criteria for intervention for STEMI. [EW]  0957 Concern for hypertensive urgency versus emergency given BP of 255/156 on intake. MAP of 167 at this time, Administered labetalol 20 mg, will proceed  with Q10 min labetalol PRN to reach goal MAP of 125 within his first hour in the ED. [RS]  1020 Patient received a second dose of labetalol 20 mg, with improvement in his map to 110.  Patient endorses symptomatic improvement in his shortness of breath, however remains tachypneic with respiratory rate of 32. [RS]  [id number] BP has increased again, 159/109 however MAP remains within goal range, 122.  [RS]  1215 Patient BP continue to spike despite labetalol Per pharmacy recommendation will start cardene drip, discussed with attending physician who recommended goal SBP of 140.  [RS]  [id number] Consult placed to hospitalist, Dr. Roosevelt Locks, who is agreeable to seeing this patient in the emergency department and admitting him to his service.  Will discontinue Cardene drip, per discussion with hospitalist. [RS]    Clinical Course User Index [EW] Daleen Bo, MD [RS] Annye Forrey, Gypsy Balsam, PA-C   MDM Rules/Calculators/A&P                         65 year old male who presents via EMS for concern for shortness of breath and severe hypertension.  Patient is vaccinated as COVID-19 and had negative Covid test 3 days ago.  Patient severely hypertensive on intake to 255/156, additionally he is tachypneic.  Vital signs otherwise normal.  Cardiopulmonary exam revealed tachycardia, tachypnea with increased work of breathing and rales in the lung bases. On 3L supplemental O2 via [place].  Abdominal exam is benign.  Patient with bilateral lower extremity 1+ edema, nonpitting.  Concern for hypertensive urgency versus emergency.  Will proceed with chest pain work-up, and will proceed with treatment for hypertensive urgency with goal decrease in map by 25% over the first hour.  See ED course above for more detail.  Chest x-ray concerning for bilateral multifocal pneumonia versus pulmonary edema, right greater than left lung.  EKG with new ST elevations in the anterior lateral leads concerning for ischemia versus infarct.  Attending spoke  with STEMI physician, see ED course above.   CBC with new anemia, hemoglobin 11.7, baseline 14.  CMP with hyponatremia to 133, hypokalemia to 3.1, and severe AKI with BUN/creatinine 96/5.03.  Baseline creatinine 2.6.  Elevated troponin to 166, BNP also elevated.  Considered hypertensive emergency given endorgan damage with acute kidney injury. D-dimer significantly elevated to 4.41; CTA not possible given renal function. Feel this patient would benefit from hospital admission at this time.  Please see ED course above.  Disposition plan discussed with patient, who was amenable to plan for admission to the hospitalist time.  This chart was dictated using voice recognition software, Dragon. Despite the best efforts of this provider to proofread and correct errors, errors may still occur which can change documentation meaning.  Final Clinical Impression(s) / ED Diagnoses Final diagnoses:  Hypertensive emergency    Rx / DC Orders ED Discharge Orders    None       Emeline Darling, PA-C 12/14/20 1333    Venus Ruhe, Gypsy Balsam, PA-C 12/14/20 1356    Daleen Bo, MD 12/14/20 1555

## 2020-12-14 NOTE — ED Triage Notes (Addendum)
Pt comes from outpt clinic via EMS. Respiratory distress O2 88 after neb 98. Pt was sob with difficulty speaking. EMS reports pt see saw breathing with sats 98% on 3L [place]. Pt was covid neg a few days ago and had 2 of 3 vaccines. BGL 324. Pt is alert and oriented.

## 2020-12-14 NOTE — ED Provider Notes (Signed)
  Face-to-face evaluation   History: Patient presents for evaluation of shortness of breath, for 1 week, acutely worse this morning.  He went to his PCP office, [place] [facility], where they treated him with a nebulizer and arrange for EMS transport.  Patient denies chest pain.  He denies fever.  He had Covid vaccines about 11 months ago, did not have booster.  He denies fever, chills, nausea or vomiting.  He is taking his usual medications.  Physical exam: Alert uncomfortable elderly male.  Tachypneic with abdominal muscle use to breathe.  No stridor.  Normal heart rate without irregularity.  Pulse palpated right wrist.  Abdomen soft and nontender without mass.  Peripheral edema 2+ bilaterally.  Calves nontender to palpation.\  Clinical Course as of 12/14/20 1555  Mon Dec 14, 2020  0944 I have requested callback from the interventional cardiologist regarding ST elevation on the EKG [EW]  [phone] Case discussed with Dr. Irish Lack, interventionalist, who does not feel this case currently meets criteria for intervention for STEMI. [EW]  0957 Concern for hypertensive urgency versus emergency given BP of 255/156 on intake. MAP of 167 at this time, Administered labetalol 20 mg, will proceed with Q10 min labetalol PRN to reach goal MAP of 125 within his first hour in the ED. [RS]  1020 Patient received a second dose of labetalol 20 mg, with improvement in his map to 110.  Patient endorses symptomatic improvement in his shortness of breath, however remains tachypneic with respiratory rate of 32. [RS]  [id number] BP has increased again, 159/109 however MAP remains within goal range, 122.  [RS]  1215 Patient BP continue to spike despite labetalol Per pharmacy recommendation will start cardene drip, discussed with attending physician who recommended goal SBP of 140.  [RS]  [id number] Consult placed to hospitalist, Dr. Roosevelt Locks, who is agreeable to seeing this patient in the emergency department and admitting him to his service.   Will discontinue Cardene drip, per discussion with hospitalist. [RS]    Clinical Course User Index [EW] Daleen Bo, MD [RS] Sponseller, Gypsy Balsam, PA-C    Medical screening examination/treatment/procedure(s) were conducted as a shared visit with non-physician practitioner(s) and myself.  I personally evaluated the patient during the encounter     Daleen Bo, MD 12/14/20 1555

## 2020-12-14 NOTE — ED Notes (Signed)
Dinner Tray Ordered @ [phone].

## 2020-12-14 NOTE — ED Notes (Signed)
Got patient on the monitor into a gown patient is resting with call bell in reach did ekg shown to er doctor

## 2020-12-15 ENCOUNTER — Inpatient Hospital Stay (HOSPITAL_COMMUNITY): Payer: 59

## 2020-12-15 ENCOUNTER — Encounter (HOSPITAL_COMMUNITY): Payer: Self-pay | Admitting: Internal Medicine

## 2020-12-15 DIAGNOSIS — I471 Supraventricular tachycardia: Secondary | ICD-10-CM | POA: Diagnosis not present

## 2020-12-15 DIAGNOSIS — I5021 Acute systolic (congestive) heart failure: Secondary | ICD-10-CM

## 2020-12-15 DIAGNOSIS — N189 Chronic kidney disease, unspecified: Secondary | ICD-10-CM

## 2020-12-15 DIAGNOSIS — J9601 Acute respiratory failure with hypoxia: Secondary | ICD-10-CM | POA: Diagnosis not present

## 2020-12-15 DIAGNOSIS — I5043 Acute on chronic combined systolic (congestive) and diastolic (congestive) heart failure: Secondary | ICD-10-CM

## 2020-12-15 DIAGNOSIS — N179 Acute kidney failure, unspecified: Secondary | ICD-10-CM | POA: Diagnosis not present

## 2020-12-15 DIAGNOSIS — E43 Unspecified severe protein-calorie malnutrition: Secondary | ICD-10-CM | POA: Insufficient documentation

## 2020-12-15 LAB — CBC WITH DIFFERENTIAL/PLATELET
Abs Immature Granulocytes: 0.09 10*3/uL — ABNORMAL HIGH (ref 0.00–0.07)
Basophils Absolute: 0 10*3/uL (ref 0.0–0.1)
Basophils Relative: 0 %
Eosinophils Absolute: 0.1 10*3/uL (ref 0.0–0.5)
Eosinophils Relative: 1 %
HCT: 36.1 % — ABNORMAL LOW (ref 39.0–52.0)
Hemoglobin: 11 g/dL — ABNORMAL LOW (ref 13.0–17.0)
Immature Granulocytes: 1 %
Lymphocytes Relative: 8 %
Lymphs Abs: 0.8 10*3/uL (ref 0.7–4.0)
MCH: 23.4 pg — ABNORMAL LOW (ref 26.0–34.0)
MCHC: 30.5 g/dL (ref 30.0–36.0)
MCV: 76.8 fL — ABNORMAL LOW (ref 80.0–100.0)
Monocytes Absolute: 0.6 10*3/uL (ref 0.1–1.0)
Monocytes Relative: 6 %
Neutro Abs: 8.3 10*3/uL — ABNORMAL HIGH (ref 1.7–7.7)
Neutrophils Relative %: 84 %
Platelets: 174 10*3/uL (ref 150–400)
RBC: 4.7 MIL/uL (ref 4.22–5.81)
RDW: 16.8 % — ABNORMAL HIGH (ref 11.5–15.5)
WBC: 9.8 10*3/uL (ref 4.0–10.5)
nRBC: 0 % (ref 0.0–0.2)

## 2020-12-15 LAB — BASIC METABOLIC PANEL
Anion gap: 14 (ref 5–15)
BUN: 101 mg/dL — ABNORMAL HIGH (ref 8–23)
CO2: 25 mmol/L (ref 22–32)
Calcium: 8.1 mg/dL — ABNORMAL LOW (ref 8.9–10.3)
Chloride: 96 mmol/L — ABNORMAL LOW (ref 98–111)
Creatinine, Ser: 5.03 mg/dL — ABNORMAL HIGH (ref 0.61–1.24)
GFR, Estimated: 12 mL/min — ABNORMAL LOW (ref >60)
Glucose, Bld: 230 mg/dL — ABNORMAL HIGH (ref 70–99)
Potassium: 3.4 mmol/L — ABNORMAL LOW (ref 3.5–5.1)
Sodium: 135 mmol/L (ref 135–145)

## 2020-12-15 LAB — MAGNESIUM
Magnesium: 2.4 mg/dL (ref 1.7–2.4)
Magnesium: 2.2 mg/dL (ref 1.7–2.4)

## 2020-12-15 LAB — HEMOGLOBIN A1C
Hgb A1c MFr Bld: 7 % — ABNORMAL HIGH (ref 4.8–5.6)
Mean Plasma Glucose: 154 mg/dL

## 2020-12-15 LAB — D-DIMER, QUANTITATIVE
D-Dimer, Quant: 3.15 ug/mL-FEU — ABNORMAL HIGH (ref 0.00–0.50)
D-Dimer, Quant: 2.73 ug/mL-FEU — ABNORMAL HIGH (ref 0.00–0.50)

## 2020-12-15 LAB — HIV ANTIBODY (ROUTINE TESTING W REFLEX): HIV Screen 4th Generation wRfx: NONREACTIVE

## 2020-12-15 LAB — PHOSPHORUS: Phosphorus: 5.9 mg/dL — ABNORMAL HIGH (ref 2.5–4.6)

## 2020-12-15 LAB — TROPONIN I (HIGH SENSITIVITY)
Troponin I (High Sensitivity): 104 ng/L (ref <18)
Troponin I (High Sensitivity): 104 ng/L (ref <18)

## 2020-12-15 LAB — GLUCOSE, CAPILLARY
Glucose-Capillary: 177 mg/dL — ABNORMAL HIGH (ref 70–99)
Glucose-Capillary: 181 mg/dL — ABNORMAL HIGH (ref 70–99)
Glucose-Capillary: 211 mg/dL — ABNORMAL HIGH (ref 70–99)
Glucose-Capillary: 175 mg/dL — ABNORMAL HIGH (ref 70–99)

## 2020-12-15 MED ORDER — FUROSEMIDE 10 MG/ML IJ SOLN
80.0000 mg | Freq: Two times a day (BID) | INTRAMUSCULAR | Status: DC
  Administered 2020-12-15 – 2020-12-16 (×2): 80 mg via INTRAVENOUS
  Filled 2020-12-15 (×2): qty 8

## 2020-12-15 MED ORDER — LEVALBUTEROL HCL 0.63 MG/3ML IN NEBU
0.6300 mg | INHALATION_SOLUTION | Freq: Four times a day (QID) | RESPIRATORY_TRACT | Status: DC
  Administered 2020-12-15: 0.63 mg via RESPIRATORY_TRACT
  Filled 2020-12-15 (×2): qty 3

## 2020-12-15 MED ORDER — IPRATROPIUM BROMIDE 0.02 % IN SOLN
0.5000 mg | Freq: Four times a day (QID) | RESPIRATORY_TRACT | Status: DC
  Administered 2020-12-15: 0.5 mg via RESPIRATORY_TRACT
  Filled 2020-12-15 (×2): qty 2.5

## 2020-12-15 MED ORDER — CARVEDILOL 25 MG PO TABS
25.0000 mg | ORAL_TABLET | Freq: Two times a day (BID) | ORAL | Status: DC
  Administered 2020-12-15 – 2020-12-17 (×4): 25 mg via ORAL
  Filled 2020-12-15 (×4): qty 1

## 2020-12-15 MED ORDER — LABETALOL HCL 5 MG/ML IV SOLN
10.0000 mg | Freq: Once | INTRAVENOUS | Status: AC
  Administered 2020-12-15: 10 mg via INTRAVENOUS
  Filled 2020-12-15: qty 4

## 2020-12-15 MED ORDER — IPRATROPIUM BROMIDE 0.02 % IN SOLN
0.5000 mg | Freq: Two times a day (BID) | RESPIRATORY_TRACT | Status: DC
  Administered 2020-12-16 (×2): 0.5 mg via RESPIRATORY_TRACT
  Filled 2020-12-15 (×2): qty 2.5

## 2020-12-15 MED ORDER — FUROSEMIDE 10 MG/ML IJ SOLN: 40.0000 mg | Freq: Two times a day (BID) | INTRAMUSCULAR | Status: DC

## 2020-12-15 MED ORDER — ISOSORBIDE MONONITRATE ER 60 MG PO TB24
60.0000 mg | ORAL_TABLET | Freq: Every day | ORAL | Status: DC
  Administered 2020-12-16 – 2021-01-05 (×20): 60 mg via ORAL
  Filled 2020-12-15 (×20): qty 1

## 2020-12-15 MED ORDER — HYDRALAZINE HCL 25 MG PO TABS: 25.0000 mg | ORAL_TABLET | Freq: Four times a day (QID) | ORAL | Status: DC | PRN

## 2020-12-15 MED ORDER — LEVALBUTEROL HCL 0.63 MG/3ML IN NEBU
0.6300 mg | INHALATION_SOLUTION | Freq: Two times a day (BID) | RESPIRATORY_TRACT | Status: DC
  Administered 2020-12-16 (×2): 0.63 mg via RESPIRATORY_TRACT
  Filled 2020-12-15 (×2): qty 3

## 2020-12-15 MED ORDER — ENSURE ENLIVE PO LIQD
237.0000 mL | Freq: Two times a day (BID) | ORAL | Status: DC
  Administered 2020-12-16 – 2020-12-22 (×6): 237 mL via ORAL

## 2020-12-15 MED ORDER — POTASSIUM CHLORIDE CRYS ER 20 MEQ PO TBCR
40.0000 meq | EXTENDED_RELEASE_TABLET | Freq: Once | ORAL | Status: AC
  Administered 2020-12-15: 40 meq via ORAL
  Filled 2020-12-15: qty 2

## 2020-12-15 MED ORDER — NEPRO/CARBSTEADY PO LIQD
237.0000 mL | Freq: Two times a day (BID) | ORAL | Status: DC
  Administered 2020-12-15: 237 mL via ORAL

## 2020-12-15 MED ORDER — HYDROCOD POLST-CPM POLST ER 10-8 MG/5ML PO SUER
5.0000 mL | Freq: Two times a day (BID) | ORAL | Status: DC | PRN
  Administered 2020-12-15: 5 mL via ORAL
  Filled 2020-12-15: qty 5

## 2020-12-15 MED ORDER — BENZONATATE 100 MG PO CAPS: 200.0000 mg | ORAL_CAPSULE | Freq: Three times a day (TID) | ORAL | Status: DC | PRN

## 2020-12-15 MED ORDER — FUROSEMIDE 10 MG/ML IJ SOLN
40.0000 mg | Freq: Once | INTRAMUSCULAR | Status: AC
  Administered 2020-12-15: 40 mg via INTRAVENOUS
  Filled 2020-12-15: qty 4

## 2020-12-15 MED ORDER — HYDRALAZINE HCL 50 MG PO TABS
100.0000 mg | ORAL_TABLET | Freq: Three times a day (TID) | ORAL | Status: DC
  Administered 2020-12-15 – 2021-01-05 (×57): 100 mg via ORAL
  Filled 2020-12-15 (×58): qty 2

## 2020-12-15 MED ORDER — HYDRALAZINE HCL 25 MG PO TABS
25.0000 mg | ORAL_TABLET | Freq: Three times a day (TID) | ORAL | Status: DC
  Administered 2020-12-15: 25 mg via ORAL
  Filled 2020-12-15: qty 1

## 2020-12-15 NOTE — Progress Notes (Signed)
PROGRESS NOTE    [name].  DF:[id number] DOB: 08/07/1956 DOA: 12/14/2020 PCP: Horald Pollen, MD  Brief Narrative:  HPI per Wynetta Fines 12/14/20 [name]. is a 65 y.o. male with medical history significant of HTN, CKD stage IIIB, IIDM, HLD, presented with increasing shortness of breath.  Symptoms started about 10 to 14 days ago, gradually gotten worse, also has had dry cough and then became more productive in the last 2 to 3 days with whitish foamy sputum, no chest pain no fever chills.  Overnight unable to lie down, and unable to catch breath even at rest this morning. Then called EMS.  At baseline, check blood pressure every now and then at home and found most occasions SBP> 1 70-1 80, no recent BP medication adjustment.  Patient reported he has been compliant with his BP meds. EMS arrived, found patient blood pressure more than 200, glucose 320, O2 saturation 88% on room air then stabilized on 3 L via nasal cannula. ED Course: Pulmonary edema, blood pressure 250, Cardene drip started and BP dropped to 140s in 3 hours.  Blood work showed AKI on CKD creatinine 5.0 compared to 2.6 baseline, K3.1, glucose 262.  Troponin 166.  EKG LVH no acute ST-T changes.  Chest x-ray pulmonary edema.  **Interim History  Continues to be significantly dyspneic and cough.  Still appears somewhat volume overloaded.  Very dyspneic to speak.  We will increase her diuresis and consult cardiology.  Also have adjusted some of his breathing medications.  Assessment & Plan:   Active Problems:   CHF (congestive heart failure) (HCC)  Acute on Chronic Combined Sysotlic and Diastolic CHF Decompensation secondary to Hypertensive Emergency  -Discussed with ED physician, appeared that Cardene drip dropped BP too fast, will discontinue Cardene drip. -BNP on Admission was 3,171.0  -Troponin 166 -> 155 -Continue to monitor BP per Protocol  -Continue Amlodipine 10 mg po Daily, hold his ARB and HCTZ  and nifedipine. -Started Hydralazine 10 mg po q8h but will titrate up to 25 mg TID and Imdur 30 mg po Daily  -Lasix 40 mg x 1 yesterday and renewed at 40 mg IV q12h -Strict I's and O's and Daily Weights; Patient is -352 mL -Repeat chest x-ray this AM showed "Cardiomegaly with pulmonary venous congestion again noted. Bilateral pulmonary infiltrates/edema right side greater than left again noted." -Continue to Monitor for S/Sx of Volume Overload  Acute Respiratory Failure with Hypoxia from Above -SpO2: 96 % O2 Flow Rate (L/min): 2 L/min  -Given DuoNeb x1; Will add Scheduled Xopenex/Atrovent, Flutter Valve and Incentive Spirometry -Continue Supplemental O2 via [place] and [place] O2 as tolerated -Continuous Pulse Oximetry and Maintain O2 Saturations >90% -Will need an Ambulatory Home O2 Screen prior to D/C and repeat CXR in the AM  -Stated Antitussives for Cough   Hypertension Emergency -As above -C/w Amlodipine 10 mg po Daily, Increase Hydralazine 10 mg po q8h -> 25 mg po TID, Hold ARB HCTZ and Nifedipine -C/w Imdur 30 mg po Daily  -C/w Lasix Diuresis  -Continue to Monitor BP per Protocol  -Last BP was 177/110  Elevated Troponins -Likely demand ischemia from HTN emergency and CHF decompensation as well as AKI. -No significant ST changes on EKG,  -Troponin I went from 166 -> 155 -Will trend troponins and ordered echocardiogram,  -No chest pain,, will not start ACS meds. -Continue aspirin and statin. -Will Consult Cardiology   AKI on CKD stage IIIb Hyperphosphatemia -In the Setting Fluid overload -Patient's BUN/Cr  is now gone from 96/5.03 -> 101/5.03 -Avoid Nephrotoxic Medications, Contrast Dyes, Hypotension and Renally adjust medications  -Diuresis with caution and repeat chest x-ray  -Continue to Monitor and Trend Renal Fxn -Repeat CMP in the AM   Elevated D-Dimer -Trending down; D-Dimer went from 4.41 -> 3.15 -> 2.73 -No significant PE risk, likely reflect acute  cardio-pulmonary changes -Repeat one reading in AM to determine further image study.  Hypokalemia -K+ was 3.1 and improved to 3.4 -Mag Level was 2.2 -Replete with po KCl 40 mEQ x1 -Continue to Monitor and Replete as Necessary -Repeat CMP in AM   Microcytic Anemia  -Patient's Hgb/Hct went from 11.7/38.1 -> 11.0/36.1 -Check Anemia Panel in the AM -Continue to Monitor for S/Sx of Bleeding; Currently no overt bleeding noted -Repeat CBC in the AM   IIDM with Hyperglycemia -Hold oral medications -Started SSI with Sensitive Novolog AC -Check HbA1c in the AM -CBGs ranging from 177-205   DVT prophylaxis: Heparin 5,000 units sq q8h given Renal FXn  Code Status: FULL CODE  Family Communication: No family present at bedside  Disposition Plan: Pending further clinical improvement and clearance by Cardiology   Status is: Inpatient  Remains inpatient appropriate because:Unsafe d/c plan, IV treatments appropriate due to intensity of illness or inability to take PO and Inpatient level of care appropriate due to severity of illness   Dispo: The patient is from: Home              Anticipated d/c is to: TBD              Anticipated d/c date is: 2 days              Patient currently is not medically stable to d/c.   Difficult to place patient No   Consultants:   Cardiology   Procedures:  ECHOCARDIOGRAM IMPRESSIONS    1. Left ventricular ejection fraction, by estimation, is 40%. The left  ventricle has mild to moderately decreased function. The left ventricle  demonstrates global hypokinesis. There is mild left ventricular  hypertrophy. Left ventricular diastolic  parameters are consistent with Grade II diastolic dysfunction  (pseudonormalization).  2. Right ventricular systolic function is normal. The right ventricular  size is normal. There is normal pulmonary artery systolic pressure. The  estimated right ventricular systolic pressure is 123XX123 mmHg.  3. Left atrial size was  moderately dilated.  4. The mitral valve is abnormal. Moderate mitral valve regurgitation. No  evidence of mitral stenosis.  5. The aortic valve is tricuspid. Aortic valve regurgitation is not  visualized. No aortic stenosis is present.  6. The inferior vena cava is normal in size with greater than 50%  respiratory variability, suggesting right atrial pressure of 3 mmHg.   FINDINGS  Left Ventricle: Left ventricular ejection fraction, by estimation, is  40%. The left ventricle has mild to moderately decreased function. The  left ventricle demonstrates global hypokinesis. The left ventricular  internal cavity size was normal in size.  There is mild left ventricular hypertrophy. Left ventricular diastolic  parameters are consistent with Grade II diastolic dysfunction  (pseudonormalization).   Right Ventricle: The right ventricular size is normal. No increase in  right ventricular wall thickness. Right ventricular systolic function is  normal. There is normal pulmonary artery systolic pressure. The tricuspid  regurgitant velocity is 2.43 m/s, and  with an assumed right atrial pressure of 3 mmHg, the estimated right  ventricular systolic pressure is 123XX123 mmHg.   Left Atrium: Left atrial size  was moderately dilated.   Right Atrium: Right atrial size was normal in size.   Pericardium: There is no evidence of pericardial effusion.   Mitral Valve: The mitral valve is abnormal. Moderate mitral valve  regurgitation. No evidence of mitral valve stenosis. MV peak gradient, 4.2  mmHg. The mean mitral valve gradient is 1.0 mmHg.   Tricuspid Valve: The tricuspid valve is normal in structure. Tricuspid  valve regurgitation is trivial.   Aortic Valve: The aortic valve is tricuspid. Aortic valve regurgitation is  not visualized. No aortic stenosis is present. Aortic valve mean gradient  measures 2.0 mmHg. Aortic valve peak gradient measures 3.3 mmHg. Aortic  valve area, by VTI measures 2.82   cm.   Pulmonic Valve: The pulmonic valve was normal in structure. Pulmonic valve  regurgitation is trivial.   Aorta: The aortic root is normal in size and structure.   Venous: The inferior vena cava is normal in size with greater than 50%  respiratory variability, suggesting right atrial pressure of 3 mmHg.   IAS/Shunts: No atrial level shunt detected by color flow Doppler.     LEFT VENTRICLE  PLAX 2D  LVIDd:     4.00 cm   Diastology  LVIDs:     3.60 cm   LV e' medial:  5.11 cm/s  LV PW:     1.80 cm   LV E/e' medial: 18.4  LV IVS:    1.40 cm   LV e' lateral:  5.98 cm/s  LVOT diam:   2.20 cm   LV E/e' lateral: 15.7  LV SV:     40  LV SV Index:  20  LVOT Area:   3.80 cm    LV Volumes (MOD)  LV vol d, MOD A2C: 147.0 ml  LV vol d, MOD A4C: 138.0 ml  LV vol s, MOD A2C: 82.2 ml  LV vol s, MOD A4C: 81.1 ml  LV SV MOD A2C:   64.8 ml  LV SV MOD A4C:   138.0 ml  LV SV MOD BP:   59.3 ml   RIGHT VENTRICLE       IVC  RV Basal diam: 2.80 cm   IVC diam: 1.80 cm  RV S prime:   11.90 cm/s  TAPSE (M-mode): 2.1 cm   LEFT ATRIUM       Index    RIGHT ATRIUM      Index  LA diam:    4.10 cm 2.05 cm/m RA Area:   17.10 cm  LA Vol (A2C):  85.5 ml 42.78 ml/m RA Volume:  42.60 ml 21.31 ml/m  LA Vol (A4C):  71.2 ml 35.62 ml/m  LA Biplane Vol: 81.0 ml 40.53 ml/m  AORTIC VALVE  AV Area (Vmax):  2.63 cm  AV Area (Vmean):  2.90 cm  AV Area (VTI):   2.82 cm  AV Vmax:      91.40 cm/s  AV Vmean:     59.800 cm/s  AV VTI:      0.140 m  AV Peak Grad:   3.3 mmHg  AV Mean Grad:   2.0 mmHg  LVOT Vmax:     63.30 cm/s  LVOT Vmean:    45.600 cm/s  LVOT VTI:     0.104 m  LVOT/AV VTI ratio: 0.74    AORTA  Ao Root diam: 3.40 cm  Ao Asc diam: 3.40 cm   MITRAL VALVE         TRICUSPID VALVE  MV Area (PHT): 4.29 cm  TR Peak grad:  23.6 mmHg  MV Area  VTI:  1.86 cm   TR Vmax:    243.00 cm/s  MV Peak grad: 4.2 mmHg  MV Mean grad: 1.0 mmHg   SHUNTS  MV Vmax:    1.03 m/s   Systemic VTI: 0.10 m  MV Vmean:   53.6 cm/s   Systemic Diam: 2.20 cm  MV Decel Time: 177 msec  MR Peak grad:  146.9 mmHg  MR Mean grad:  96.0 mmHg  MR Vmax:     606.00 cm/s  MR Vmean:    462.0 cm/s  MR PISA:     1.57 cm  MR PISA Eff ROA: 10 mm  MR PISA Radius: 0.50 cm  MV E velocity: 93.80 cm/s  MV A velocity: 36.00 cm/s  MV E/A ratio: 2.61   Antimicrobials:  Anti-infectives (From admission, onward)   None        Subjective: Seen and examined at bedside and was extremely dyspneic still still coughing quite significantly.  Was dyspneic on conversation and asking something for antitussives.  Not feeling as well and still feeling short of breath.  Denies any chest pain.  No other concerns or complaints at this time.   Objective: Vitals:   12/15/20 0524 12/15/20 0631 12/15/20 0635 12/15/20 0900  BP: (!) 169/112 (!) 177/110    Pulse: 85     Resp:      Temp:      TempSrc:      SpO2: 96%   96%  Weight:   73.4 kg   Height:        Intake/Output Summary (Last 24 hours) at 12/15/2020 1156 Last data filed at 12/15/2020 0350 Gross per 24 hour  Intake 123 ml  Output 475 ml  Net -352 ml   Filed Weights   12/14/20 2300 12/15/20 0635  Weight: 72.9 kg 73.4 kg   Examination: Physical Exam:  Constitutional: WN/WD African-American male currently in moderate respiratory distress coughing significantly appears uncomfortable Eyes: Lids and conjunctivae normal, sclerae anicteric  ENMT: External Ears, Nose appear normal. Grossly normal hearing. Neck: Appears normal, supple, no cervical masses, normal ROM, no appreciable thyromegaly; no JVD Respiratory: Diminished to auscultation bilaterally with crackles noted and coarse breath sounds in very slight rhonchi.  Has a slight increased respiratory effort and is coughing  significantly; wearing supplemental oxygen via nasal cannula Cardiovascular: RRR, no murmurs / rubs / gallops. S1 and S2 auscultated.  1+ extremity edema Abdomen: Soft, non-tender, non-distended. Bowel sounds positive.  GU: Deferred. Musculoskeletal: No clubbing / cyanosis of digits/nails. No joint deformity upper and lower extremities.  Skin: No rashes, lesions, ulcers on limited skin evaluation. No induration; Warm and dry.  Neurologic: CN 2-12 grossly intact with no focal deficits. Romberg sign and cerebellar reflexes not assessed.  Psychiatric: Normal judgment and insight. Alert and oriented x 3. Anxious mood and appropriate affect.   Data Reviewed: I have personally reviewed following labs and imaging studies  CBC: Recent Labs  Lab 12/14/20 0952 12/15/20 0901  WBC 9.2 9.8  NEUTROABS  --  8.3*  HGB 11.7* 11.0*  HCT 38.1* 36.1*  MCV 76.4* 76.8*  PLT 200 [id number]   Basic Metabolic Panel: Recent Labs  Lab 12/14/20 0952 12/15/20 0147 12/15/20 0901  NA 133* 135  --   K 3.1* 3.4*  --   CL 93* 96*  --   CO2 24 25  --   GLUCOSE 262* 230*  --   BUN 96* 101*  --  CREATININE 5.03* 5.03*  --   CALCIUM 8.4* 8.1*  --   MG  --  2.4 2.2  PHOS  --   --  5.9*   GFR: Estimated Creatinine Clearance: 14.8 mL/min (A) (by C-G formula based on SCr of 5.03 mg/dL (H)). Liver Function Tests: Recent Labs  Lab 12/14/20 0952  AST 30  ALT 49*  ALKPHOS 84  BILITOT 0.9  PROT 6.5  ALBUMIN 3.1*   No results for input(s): LIPASE, AMYLASE in the last 168 hours. No results for input(s): AMMONIA in the last 168 hours. Coagulation Profile: No results for input(s): INR, PROTIME in the last 168 hours. Cardiac Enzymes: No results for input(s): CKTOTAL, CKMB, CKMBINDEX, TROPONINI in the last 168 hours. BNP (last 3 results) No results for input(s): PROBNP in the last 8760 hours. HbA1C: No results for input(s): HGBA1C in the last 72 hours. CBG: Recent Labs  Lab 12/14/20 1729 12/14/20 2333  12/15/20 0759  GLUCAP 186* 205* 177*   Lipid Profile: No results for input(s): CHOL, HDL, LDLCALC, TRIG, CHOLHDL, LDLDIRECT in the last 72 hours. Thyroid Function Tests: No results for input(s): TSH, T4TOTAL, FREET4, T3FREE, THYROIDAB in the last 72 hours. Anemia Panel: No results for input(s): VITAMINB12, FOLATE, FERRITIN, TIBC, IRON, RETICCTPCT in the last 72 hours. Sepsis Labs: No results for input(s): PROCALCITON, LATICACIDVEN in the last 168 hours.  Recent Results (from the past 240 hour(s))  Resp Panel by RT-PCR (Flu A&B, Covid) Nasopharyngeal Swab     Status: None   Collection Time: 12/14/20 11:00 AM   Specimen: Nasopharyngeal Swab; Nasopharyngeal(NP) swabs in vial transport medium  Result Value Ref Range Status   SARS Coronavirus 2 by RT PCR NEGATIVE NEGATIVE Final    Comment: (NOTE) SARS-CoV-2 target nucleic acids are NOT DETECTED.  The SARS-CoV-2 RNA is generally detectable in upper respiratory specimens during the acute phase of infection. The lowest concentration of SARS-CoV-2 viral copies this assay can detect is 138 copies/mL. A negative result does not preclude SARS-Cov-2 infection and should not be used as the sole basis for treatment or other patient management decisions. A negative result may occur with  improper specimen collection/handling, submission of specimen other than nasopharyngeal swab, presence of viral mutation(s) within the areas targeted by this assay, and inadequate number of viral copies(<138 copies/mL). A negative result must be combined with clinical observations, patient history, and epidemiological information. The expected result is Negative.  Fact Sheet for Patients:  EntrepreneurPulse.com.au  Fact Sheet for Healthcare Providers:  IncredibleEmployment.be  This test is no t yet approved or cleared by the [place] FDA and  has been authorized for detection and/or diagnosis of SARS-CoV-2 by FDA  under an Emergency Use Authorization (EUA). This EUA will remain  in effect (meaning this test can be used) for the duration of the COVID-19 declaration under Section 564(b)(1) of the Act, 21 U.S.C.section 360bbb-3(b)(1), unless the authorization is terminated  or revoked sooner.       Influenza A by PCR NEGATIVE NEGATIVE Final   Influenza B by PCR NEGATIVE NEGATIVE Final    Comment: (NOTE) The Xpert Xpress SARS-CoV-2/FLU/RSV plus assay is intended as an aid in the diagnosis of influenza from Nasopharyngeal swab specimens and should not be used as a sole basis for treatment. Nasal washings and aspirates are unacceptable for Xpert Xpress SARS-CoV-2/FLU/RSV testing.  Fact Sheet for Patients: EntrepreneurPulse.com.au  Fact Sheet for Healthcare Providers: IncredibleEmployment.be  This test is not yet approved or cleared by the [place] FDA and has  been authorized for detection and/or diagnosis of SARS-CoV-2 by FDA under an Emergency Use Authorization (EUA). This EUA will remain in effect (meaning this test can be used) for the duration of the COVID-19 declaration under Section 564(b)(1) of the Act, 21 U.S.C. section 360bbb-3(b)(1), unless the authorization is terminated or revoked.  Performed at [facility], [place] [address], [place], [place] [address]      RN Pressure Injury Documentation:     Estimated body mass index is 23.91 kg/m as calculated from the following:   Height as of this encounter: '5\' 9"'$  (1.753 m).   Weight as of this encounter: 73.4 kg.  Malnutrition Type:   Malnutrition Characteristics:   Nutrition Interventions:     Radiology Studies: DG Chest 1 View  Result Date: 12/15/2020 CLINICAL DATA:  Congestive heart failure. EXAM: CHEST  1 VIEW COMPARISON:  12/14/2020. FINDINGS: Mediastinum hilar structures normal. Cardiomegaly with pulmonary venous congestion again noted. Bilateral pulmonary infiltrates/edema  right side greater than left again noted without interim change. No pleural effusion or pneumothorax. IMPRESSION: 1. Cardiomegaly with pulmonary venous congestion again noted. 2. Bilateral pulmonary infiltrates/edema right side greater than left again noted. Electronically Signed   By: Marcello Moores  Register   On: 12/15/2020 07:57   DG Chest Port 1 View  Result Date: 12/14/2020 CLINICAL DATA:  Shortness of breath, cough, hypertensive EXAM: PORTABLE CHEST 1 VIEW COMPARISON:  12/16/2015 FINDINGS: Cardiomegaly. Bilateral airspace disease, right greater than left. No visible effusions or pneumothorax. No acute bony abnormality. IMPRESSION: Bilateral airspace disease, right greater than left, favor multifocal pneumonia. Electronically Signed   By: Rolm Baptise M.D.   On: 12/14/2020 10:01   ECHOCARDIOGRAM COMPLETE  Result Date: 12/14/2020    ECHOCARDIOGRAM REPORT   Patient Name:   Lenville Rinke. Date of Exam: 12/14/2020 Medical Rec #:  [id number]          Height:       69.0 in Accession #:    [id number]         Weight:       185.0 lb Date of Birth:  1956/04/15          BSA:          1.999 m Patient Age:    62 years           BP:           173/102 mmHg Patient Gender: M                  HR:           73 bpm. Exam Location:  Inpatient Procedure: 2D Echo, Cardiac Doppler and Color Doppler Indications:    CHF-Acute Diastolic  History:        Patient has no prior history of Echocardiogram examinations.                 Signs/Symptoms:Shortness of Breath; Risk Factors:Hypertension,                 Diabetes and Dyslipidemia. CKD.  Sonographer:    Clayton Lefort RDCS (AE) Referring Phys: [id number] [place]  1. Left ventricular ejection fraction, by estimation, is 40%. The left ventricle has mild to moderately decreased function. The left ventricle demonstrates global hypokinesis. There is mild left ventricular hypertrophy. Left ventricular diastolic parameters are consistent with Grade II diastolic dysfunction  (pseudonormalization).  2. Right ventricular systolic function is normal. The right ventricular size is normal. There is normal pulmonary artery  systolic pressure. The estimated right ventricular systolic pressure is 123XX123 mmHg.  3. Left atrial size was moderately dilated.  4. The mitral valve is abnormal. Moderate mitral valve regurgitation. No evidence of mitral stenosis.  5. The aortic valve is tricuspid. Aortic valve regurgitation is not visualized. No aortic stenosis is present.  6. The inferior vena cava is normal in size with greater than 50% respiratory variability, suggesting right atrial pressure of 3 mmHg. FINDINGS  Left Ventricle: Left ventricular ejection fraction, by estimation, is 40%. The left ventricle has mild to moderately decreased function. The left ventricle demonstrates global hypokinesis. The left ventricular internal cavity size was normal in size. There is mild left ventricular hypertrophy. Left ventricular diastolic parameters are consistent with Grade II diastolic dysfunction (pseudonormalization). Right Ventricle: The right ventricular size is normal. No increase in right ventricular wall thickness. Right ventricular systolic function is normal. There is normal pulmonary artery systolic pressure. The tricuspid regurgitant velocity is 2.43 m/s, and  with an assumed right atrial pressure of 3 mmHg, the estimated right ventricular systolic pressure is 123XX123 mmHg. Left Atrium: Left atrial size was moderately dilated. Right Atrium: Right atrial size was normal in size. Pericardium: There is no evidence of pericardial effusion. Mitral Valve: The mitral valve is abnormal. Moderate mitral valve regurgitation. No evidence of mitral valve stenosis. MV peak gradient, 4.2 mmHg. The mean mitral valve gradient is 1.0 mmHg. Tricuspid Valve: The tricuspid valve is normal in structure. Tricuspid valve regurgitation is trivial. Aortic Valve: The aortic valve is tricuspid. Aortic valve regurgitation is not  visualized. No aortic stenosis is present. Aortic valve mean gradient measures 2.0 mmHg. Aortic valve peak gradient measures 3.3 mmHg. Aortic valve area, by VTI measures 2.82 cm. Pulmonic Valve: The pulmonic valve was normal in structure. Pulmonic valve regurgitation is trivial. Aorta: The aortic root is normal in size and structure. Venous: The inferior vena cava is normal in size with greater than 50% respiratory variability, suggesting right atrial pressure of 3 mmHg. IAS/Shunts: No atrial level shunt detected by color flow Doppler.  LEFT VENTRICLE PLAX 2D LVIDd:         4.00 cm      Diastology LVIDs:         3.60 cm      LV e' medial:    5.11 cm/s LV PW:         1.80 cm      LV E/e' medial:  18.4 LV IVS:        1.40 cm      LV e' lateral:   5.98 cm/s LVOT diam:     2.20 cm      LV E/e' lateral: 15.7 LV SV:         40 LV SV Index:   20 LVOT Area:     3.80 cm  LV Volumes (MOD) LV vol d, MOD A2C: 147.0 ml LV vol d, MOD A4C: 138.0 ml LV vol s, MOD A2C: 82.2 ml LV vol s, MOD A4C: 81.1 ml LV SV MOD A2C:     64.8 ml LV SV MOD A4C:     138.0 ml LV SV MOD BP:      59.3 ml RIGHT VENTRICLE             IVC RV Basal diam:  2.80 cm     IVC diam: 1.80 cm RV S prime:     11.90 cm/s TAPSE (M-mode): 2.1 cm LEFT ATRIUM  Index       RIGHT ATRIUM           Index LA diam:        4.10 cm 2.05 cm/m  RA Area:     17.10 cm LA Vol (A2C):   85.5 ml 42.78 ml/m RA Volume:   42.60 ml  21.31 ml/m LA Vol (A4C):   71.2 ml 35.62 ml/m LA Biplane Vol: 81.0 ml 40.53 ml/m  AORTIC VALVE AV Area (Vmax):    2.63 cm AV Area (Vmean):   2.90 cm AV Area (VTI):     2.82 cm AV Vmax:           91.40 cm/s AV Vmean:          59.800 cm/s AV VTI:            0.140 m AV Peak Grad:      3.3 mmHg AV Mean Grad:      2.0 mmHg LVOT Vmax:         63.30 cm/s LVOT Vmean:        45.600 cm/s LVOT VTI:          0.104 m LVOT/AV VTI ratio: 0.74  AORTA Ao Root diam: 3.40 cm Ao Asc diam:  3.40 cm MITRAL VALVE                 TRICUSPID VALVE MV Area (PHT):  4.29 cm      TR Peak grad:   23.6 mmHg MV Area VTI:   1.86 cm      TR Vmax:        243.00 cm/s MV Peak grad:  4.2 mmHg MV Mean grad:  1.0 mmHg      SHUNTS MV Vmax:       1.03 m/s      Systemic VTI:  0.10 m MV Vmean:      53.6 cm/s     Systemic Diam: 2.20 cm MV Decel Time: 177 msec MR Peak grad:    146.9 mmHg MR Mean grad:    96.0 mmHg MR Vmax:         606.00 cm/s MR Vmean:        462.0 cm/s MR PISA:         1.57 cm MR PISA Eff ROA: 10 mm MR PISA Radius:  0.50 cm MV E velocity: 93.80 cm/s MV A velocity: 36.00 cm/s MV E/A ratio:  2.61 Loralie Champagne MD Electronically signed by Loralie Champagne MD Signature Date/Time: 12/14/2020/5:37:39 PM    Final    Scheduled Meds: . amLODipine  10 mg Oral Daily  . aspirin EC  81 mg Oral Daily  . atorvastatin  40 mg Oral q1800  . feeding supplement (NEPRO CARB STEADY)  237 mL Oral BID BM  . heparin  5,000 Units Subcutaneous Q12H  . hydrALAZINE  10 mg Oral Q8H  . influenza vac split quadrivalent PF  0.5 mL Intramuscular Tomorrow-1000  . insulin aspart  0-9 Units Subcutaneous TID WC  . isosorbide mononitrate  30 mg Oral Daily  . pneumococcal 23 valent vaccine  0.5 mL Intramuscular Tomorrow-1000  . sodium chloride flush  3 mL Intravenous Q12H   Continuous Infusions: . sodium chloride      LOS: 1 day   Kerney Elbe, DO [facility] PAGER is on AMION  If 7PM-7AM, please contact night-coverage [email or web address]

## 2020-12-15 NOTE — Progress Notes (Signed)
Pt HR 140's asymptomatic. Dr. Alfredia Ferguson made aware. New orders given. Carroll Kinds RN

## 2020-12-15 NOTE — Progress Notes (Signed)
Initial Nutrition Assessment  DOCUMENTATION CODES:   Severe malnutrition in context of acute illness/injury  INTERVENTION:    Ensure Enlive po BID, each supplement provides 350 kcal and 20 grams of protein.  Recommend liberalize diet to regular to help improve intake. Renal restrictions not needed at this time.  NUTRITION DIAGNOSIS:   Severe Malnutrition related to acute illness (SOB r/t CHF causing poor appetite) as evidenced by mild fat depletion,mild muscle depletion,moderate muscle depletion.  GOAL:   Patient will meet greater than or equal to 90% of their needs  MONITOR:   PO intake,Supplement acceptance,Labs  REASON FOR ASSESSMENT:   Malnutrition Screening Tool    ASSESSMENT:   65 yo male admitted with increasing SOB, CHF decompensation r/t hypertensive urgency. PMH includes HTN, CKD-3b, DM-2, HLD.   Patient reports poor intake for 3 weeks PTA d/t poor appetite with acute illness. He has lost weight within the past 3 weeks due to poor intake. He says that he ate 100% of breakfast this morning. He agreed to drink Ensure supplements between meals.   Labs reviewed. Phos 5.9, K 3.4 CBG: 177-181  Medications reviewed and include Lasix, Novolog. Has received potassium replacement.  Weight history reviewed. Patient is down by 13% since last October, which is severe. Suspect most of this weight loss was within the past 3 weeks. Patient meets criteria for severe malnutrition with severe weight loss in addition to mild fat depletion and mild-moderate muscle depletion.   NUTRITION - FOCUSED PHYSICAL EXAM:  Flowsheet Row Most Recent Value  Orbital Region Mild depletion  Upper Arm Region Mild depletion  Thoracic and Lumbar Region Mild depletion  Buccal Region No depletion  Temple Region Mild depletion  Clavicle Bone Region Mild depletion  Clavicle and Acromion Bone Region Mild depletion  Scapular Bone Region Unable to assess  Dorsal Hand Moderate depletion  Patellar  Region Moderate depletion  Anterior Thigh Region Mild depletion  Posterior Calf Region Moderate depletion  Edema (RD Assessment) None  Hair Reviewed  Eyes Reviewed  Mouth Reviewed  Skin Reviewed  Nails Reviewed       Diet Order:   Diet Order            Diet renal/carb modified with fluid restriction Diet-HS Snack? Nothing; Fluid restriction: 1200 mL Fluid; Room service appropriate? Yes; Fluid consistency: Thin  Diet effective now                 EDUCATION NEEDS:   Not appropriate for education at this time  Skin:  Skin Assessment: Reviewed RN Assessment  Last BM:  2/13  Height:   Ht Readings from Last 1 Encounters:  12/14/20 '5\' 9"'$  (1.753 m)    Weight:   Wt Readings from Last 1 Encounters:  12/15/20 73.4 kg    Ideal Body Weight:  72.7 kg  BMI:  Body mass index is 23.91 kg/m.  Estimated Nutritional Needs:   Kcal:  2200-2400  Protein:  110-130 gm  Fluid:  2-2.2 L    Lucas Mallow, RD, LDN, CNSC Please refer to Amion for contact information.

## 2020-12-15 NOTE — Progress Notes (Signed)
Heart Failure Nurse Navigator Progress Note  PCP: Horald Pollen, MD PCP-Cardiologist: none on file Admission Diagnosis: CHF  Admitted from: home alone  Presentation:   Ruben Camacho. presented with [facility], HTN urgency, CHF exacerbation and tachycardia. Ruben Camacho is laying crouched in bed on room air with eyes closed, pt easily arouseable. Noted several bursts of increased HR 165 for 6-9 seconds then returns to rate of 90s, pt states he cannot feel when his heart rate increases. Pt had recently received scheduled neb treatment from [facility]. BP significantly elevated at time of interview. Pt states he has not taken his medications d/t cost and gap in insurance until recently received medicare. Noted significant SCr @ 5.03 (baseline 2.6). Pt states decrease in urination. Pt did use urinal during interview by sitting on side of the bed without assistance. Pt has a MSEd and was an Automotive engineer for a career.   ECHO/ LVEF: 40%, G2DD (12/14/20)  Clinical Course:  Past Medical History:  Diagnosis Date  . Gout    1-2 exacerbations per year.  Takes Advil PRN for pain.  Marland Kitchen Hyperlipidemia   . Hypertension   . Migraine   . Stroke ([place]) 07/01/2012   L sided weakness, slurred speech.  Admission [facility].    . Vitamin D deficiency 08/09/2019     Social History   Socioeconomic History  . Marital status: Married    Spouse name: Not on file  . Number of children: 2  . Years of education: Masters  . Highest education level: Not on file  Occupational History  . Occupation: PRINCIPAL    Employer: [employer]  Tobacco Use  . Smoking status: Never Smoker  . Smokeless tobacco: Never Used  Substance and Sexual Activity  . Alcohol use: No    Alcohol/week: 0.0 standard drinks    Comment: former drinker  . Drug use: No  . Sexual activity: Yes    Partners: Female  Other Topics Concern  . Not on file  Social History Narrative   Marital status: married      CHildren;   2 children; no grandchildren.      Lives at home alone.      Employment:  Pharmacist, hospital; principal in past.      Tobacco: none      Alcohol: weekends      Exercise:  Walking; weightlifting; basketball.   Right handed.   2 cups caffeine/day.   Social Determinants of Health   Financial Resource Strain: Medium Risk  . Difficulty of Paying Living Expenses: Somewhat hard  Food Insecurity: No Food Insecurity  . Worried About Charity fundraiser in the Last Year: Never true  . Ran Out of Food in the Last Year: Never true  Transportation Needs: No Transportation Needs  . Lack of Transportation (Medical): No  . Lack of Transportation (Non-Medical): No  Physical Activity: Inactive  . Days of Exercise per Week: 0 days  . Minutes of Exercise per Session: 0 min  Stress: Not on file  Social Connections: Not on file    High Risk Criteria for Readmission and/or Poor Patient Outcomes:  Heart failure hospital admissions (last 6 months): 1   No Show rate: 3%  Difficult social situation: yes  Demonstrates medication adherence: no  Primary Language: Enlgish  Literacy level: Master's in Education  Barriers of Care:   -financial -no social support local (children live in [place])  Considerations/Referrals:   Referral made to Heart Failure Pharmacist Stewardship: pending renal  funciton Referral made to Heart & Vascular TOC clinic: pending hospitalization and renal function improvement.  Items for Follow-up on DC/TOC: -medication cost: has not taken medications x1 year per pt statement, nor seen PCP. -SW: pt concerned about paying meds, rent, bills.   Pricilla Holm, RN, BSN Heart Failure Nurse Navigator [phone]

## 2020-12-15 NOTE — Consult Note (Signed)
[facility] Renal Consultation Note  Requesting MD: Alfredia Ferguson Indication for Consultation: A on CRF  HPI:  Ruben Camacho. is a 65 y.o. male with past medical history significant for type 2 diabetes mellitus, hypertension which has been malignant at times, hyperlipidemia, history of stroke, gout as well as most recently known stage IIIb CKD with creatinine of 2.6 in late October 2021, patient also demonstrated renal insufficiency in November 2019 with creatinine as high as 1.8.    He presented to the emergency department yesterday in the afternoon with complaints of shortness of breath for approximately 2 weeks that continued to worsen.  He also reported cough but no sputum production, lower extremity edema.  Blood pressure was found to be greater than [id number] systolic, was hypoxic on room air, also had a sugar of 320 and creatinine of 5.  Chest x-ray appeared to show pulmonary edema.  Plan was to admit, get blood pressure down, diurese.  He received Cardene initially but is now on carvedilol and hydralazine with blood pressure 150- 170.  Creatinine stayed stable at 5 from yesterday to today.  Ins and outs not really recorded, at least 475 of urine output.  He says he feels may be a little better but not much.  History taking is difficult, he still feels quite poorly, tremulous.  Says that he felt his urine output was decreasing as an outpatient.  Took Advil but not a lot.  Denies other medication changes recently.  Unclear if he was on an ACE or an ARB.  He reported nausea and vomiting to the cardiologist but denies that to me today.  Still feels short of breath, some saturations in the high 90s on room air recorded but he has 2 L on to my exam  Creat  Date/Time Value Ref Range Status  08/19/2015 08:44 AM 1.28 0.70 - 1.33 mg/dL Final  12/01/2014 10:37 AM 1.07 0.50 - 1.35 mg/dL Final  04/19/2014 10:42 AM 1.22 0.50 - 1.35 mg/dL Final  09/07/2013 09:11 AM 1.28 0.50 - 1.35 mg/dL Final   03/19/2013 09:09 AM 1.27 0.50 - 1.35 mg/dL Final   Creatinine, Ser  Date/Time Value Ref Range Status  12/15/2020 01:47 AM 5.03 (H) 0.61 - 1.24 mg/dL Final  12/14/2020 09:52 AM 5.03 (H) 0.61 - 1.24 mg/dL Final  08/25/2020 08:00 AM 2.60 (H) 0.61 - 1.24 mg/dL Final  08/25/2020 07:59 AM 2.61 (H) 0.61 - 1.24 mg/dL Final  08/10/2020 05:03 AM 2.17 (H) 0.61 - 1.24 mg/dL Final  08/06/2019 02:00 PM 2.34 (H) 0.76 - 1.27 mg/dL Final  09/04/2018 12:11 PM 1.81 (H) 0.76 - 1.27 mg/dL Final  11/15/2017 06:11 PM 1.47 (H) 0.76 - 1.27 mg/dL Final  12/08/2014 07:16 AM 1.00 0.50 - 1.35 mg/dL Final  12/08/2014 07:05 AM 1.07 0.50 - 1.35 mg/dL Final     PMHx:   Past Medical History:  Diagnosis Date  . Gout    1-2 exacerbations per year.  Takes Advil PRN for pain.  [facility] Hyperlipidemia   . Hypertension   . Migraine   . Stroke ([place]) 07/01/2012   L sided weakness, slurred speech.  Admission [facility].    . Vitamin D deficiency 08/09/2019    Past Surgical History:  Procedure Laterality Date  . Admission  07/01/2012   CVA (L sided weakness, slurred speech).  [facility].  . COLONOSCOPY  10/31/2008   no polyps.  Repeat in 10 years.  [place].  [facility] HERNIA REPAIR      Family Hx:  Family  History  Problem Relation Age of Onset  . Stroke Mother   . Hypertension Mother   . Hypertension Daughter   . Cancer Maternal Grandmother        lung  . Healthy Father        Died in motorcycle accident    Social History:  reports that he has never smoked. He has never used smokeless tobacco. He reports that he does not drink alcohol and does not use drugs.  Allergies: No Known Allergies  Medications: Prior to Admission medications   Medication Sig Start Date End Date Taking? Authorizing Provider  amLODipine (NORVASC) 10 MG tablet Take 1 tablet (10 mg total) by mouth daily. 08/25/20  Yes Noemi Chapel, MD  aspirin 81 MG tablet Take 81 mg by mouth daily.   Yes [provider]  atorvastatin (LIPITOR) 40 MG  tablet Take 1 tablet (40 mg total) by mouth daily at 6 PM. 08/25/20 11/23/20 Yes Noemi Chapel, MD  azithromycin (ZITHROMAX) 250 MG tablet Take 250-500 mg by mouth as directed. 12/10/20  Yes [provider]  diclofenac Sodium (VOLTAREN) 1 % GEL Apply 4 g topically 4 (four) times daily. Patient taking differently: Apply 4 g topically 4 (four) times daily as needed (pain). 08/10/20  Yes Deno Etienne, DO  glipiZIDE (GLUCOTROL XL) 2.5 MG 24 hr tablet TAKE 1 TABLET(2.5 MG) BY MOUTH DAILY WITH BREAKFAST Patient not taking: No sig reported 08/25/20   Noemi Chapel, MD  glucose blood (PRECISION QID TEST) test strip Use as directed to check blood sugar 2 times a day before meals. 06/14/19   [provider]  Lancet Devices (SIMPLE DIAGNOSTICS LANCING DEV) MISC Use as directed to check blood sugar 2 times a day before meals. 06/14/19   [provider]  Lancets 30G MISC Use as directed to check blood sugar 2 times a day before meals. 06/14/19   [provider]  losartan-hydrochlorothiazide (HYZAAR) 100-25 MG tablet Take 1 tablet by mouth daily. 08/25/20 09/24/20  Noemi Chapel, MD  NIFEdipine (PROCARDIA XL/NIFEDICAL-XL) 90 MG 24 hr tablet Take 1 tablet (90 mg total) by mouth daily. 08/25/20 09/24/20  Noemi Chapel, MD  colchicine 0.6 MG tablet Take one pill an hour after your first dose in the ED Patient not taking: Reported on 08/25/2020 08/10/20 08/25/20  Deno Etienne, DO    I have reviewed the patient's current medications.  Labs:  Results for orders placed or performed during the hospital encounter of 12/14/20 (from the past 48 hour(s))  D-dimer, quantitative (not at [facility])     Status: Abnormal   Collection Time: 12/14/20  9:42 AM  Result Value Ref Range   D-Dimer, Quant 4.41 (H) 0.00 - 0.50 ug/mL-FEU    Comment: (NOTE) At the manufacturer cut-off value of 0.5 g/mL FEU, this assay has a negative predictive value of 95-100%.This assay is intended for use in conjunction with  a clinical pretest probability (PTP) assessment model to exclude pulmonary embolism (PE) and deep venous thrombosis (DVT) in outpatients suspected of PE or DVT. Results should be correlated with clinical presentation. Performed at [facility], [place] [address] [place], [place] [address]   Comprehensive metabolic panel     Status: Abnormal   Collection Time: 12/14/20  9:52 AM  Result Value Ref Range   Sodium 133 (L) 135 - 145 mmol/L   Potassium 3.1 (L) 3.5 - 5.1 mmol/L   Chloride 93 (L) 98 - 111 mmol/L   CO2 24 22 - 32 mmol/L  Glucose, Bld 262 (H) 70 - 99 mg/dL    Comment: Glucose reference range applies only to samples taken after fasting for at least 8 hours.   BUN 96 (H) 8 - 23 mg/dL   Creatinine, Ser 5.03 (H) 0.61 - 1.24 mg/dL   Calcium 8.4 (L) 8.9 - 10.3 mg/dL   Total Protein 6.5 6.5 - 8.1 g/dL   Albumin 3.1 (L) 3.5 - 5.0 g/dL   AST 30 15 - 41 U/L   ALT 49 (H) 0 - 44 U/L   Alkaline Phosphatase 84 38 - 126 U/L   Total Bilirubin 0.9 0.3 - 1.2 mg/dL   GFR, Estimated 12 (L) >60 mL/min    Comment: (NOTE) Calculated using the CKD-EPI Creatinine Equation (2021)    Anion gap 16 (H) 5 - 15    Comment: Performed at [facility], [place] [address] [place], [place] [address]  CBC     Status: Abnormal   Collection Time: 12/14/20  9:52 AM  Result Value Ref Range   WBC 9.2 4.0 - 10.5 K/uL   RBC 4.99 4.22 - 5.81 MIL/uL   Hemoglobin 11.7 (L) 13.0 - 17.0 g/dL   HCT 38.1 (L) 39.0 - 52.0 %   MCV 76.4 (L) 80.0 - 100.0 fL   MCH 23.4 (L) 26.0 - 34.0 pg   MCHC 30.7 30.0 - 36.0 g/dL   RDW 16.9 (H) 11.5 - 15.5 %   Platelets 200 150 - 400 K/uL   nRBC 0.0 0.0 - 0.2 %    Comment: Performed at [facility], [place] [address] [place], [place] [address]  Brain natriuretic peptide     Status: Abnormal   Collection Time: 12/14/20  9:52 AM  Result Value Ref Range   B Natriuretic Peptide 3,171.0 (H) 0.0 - 100.0 pg/mL    Comment: Performed at [facility] [address]  [place], [place] [address]  Troponin I (High Sensitivity)     Status: Abnormal   Collection Time: 12/14/20  9:52 AM  Result Value Ref Range   Troponin I (High Sensitivity) 166 (HH) <18 ng/L    Comment: CRITICAL RESULT CALLED TO, READ BACK BY AND VERIFIED WITH: Z CAGLE RN [id number] BY A BENNETT (NOTE) Elevated high sensitivity troponin I (hsTnI) values and significant  changes across serial measurements may suggest ACS but many other  chronic and acute conditions are known to elevate hsTnI results.  Refer to the Links section for chest pain algorithms and additional  guidance. Performed at [facility], [place] [address], [place], [place] [address]   POC SARS Coronavirus 2 Ag-ED - Nasal Swab     Status: None   Collection Time: 12/14/20 10:43 AM  Result Value Ref Range   SARS Coronavirus 2 Ag NEGATIVE NEGATIVE    Comment: (NOTE) SARS-CoV-2 antigen NOT DETECTED.   Negative results are presumptive.  Negative results do not preclude SARS-CoV-2 infection and should not be used as the sole basis for treatment or other patient management decisions, including infection  control decisions, particularly in the presence of clinical signs and  symptoms consistent with COVID-19, or in those who have been in contact with the virus.  Negative results must be combined with clinical observations, patient history, and epidemiological information. The expected result is Negative.  Fact Sheet for Patients: HandmadeRecipes.com.cy  Fact Sheet for Healthcare Providers: FuneralLife.at  This test is not yet approved or cleared by the [place] and  has been authorized  for detection and/or diagnosis of SARS-CoV-2 by FDA under an Emergency Use Authorization (EUA).  This EUA will remain in effect (meaning this test can be used) for the duration of  the COV ID-19 declaration under Section 564(b)(1) of the Act, 21 U.S.C. section 360bbb-3(b)(1), unless  the authorization is terminated or revoked sooner.    Resp Panel by RT-PCR (Flu A&B, Covid) Nasopharyngeal Swab     Status: None   Collection Time: 12/14/20 11:00 AM   Specimen: Nasopharyngeal Swab; Nasopharyngeal(NP) swabs in vial transport medium  Result Value Ref Range   SARS Coronavirus 2 by RT PCR NEGATIVE NEGATIVE    Comment: (NOTE) SARS-CoV-2 target nucleic acids are NOT DETECTED.  The SARS-CoV-2 RNA is generally detectable in upper respiratory specimens during the acute phase of infection. The lowest concentration of SARS-CoV-2 viral copies this assay can detect is 138 copies/mL. A negative result does not preclude SARS-Cov-2 infection and should not be used as the sole basis for treatment or other patient management decisions. A negative result may occur with  improper specimen collection/handling, submission of specimen other than nasopharyngeal swab, presence of viral mutation(s) within the areas targeted by this assay, and inadequate number of viral copies(<138 copies/mL). A negative result must be combined with clinical observations, patient history, and epidemiological information. The expected result is Negative.  Fact Sheet for Patients:  EntrepreneurPulse.com.au  Fact Sheet for Healthcare Providers:  IncredibleEmployment.be  This test is no t yet approved or cleared by the [place] FDA and  has been authorized for detection and/or diagnosis of SARS-CoV-2 by FDA under an Emergency Use Authorization (EUA). This EUA will remain  in effect (meaning this test can be used) for the duration of the COVID-19 declaration under Section 564(b)(1) of the Act, 21 U.S.C.section 360bbb-3(b)(1), unless the authorization is terminated  or revoked sooner.       Influenza A by PCR NEGATIVE NEGATIVE   Influenza B by PCR NEGATIVE NEGATIVE    Comment: (NOTE) The Xpert Xpress SARS-CoV-2/FLU/RSV plus assay is intended as an aid in the  diagnosis of influenza from Nasopharyngeal swab specimens and should not be used as a sole basis for treatment. Nasal washings and aspirates are unacceptable for Xpert Xpress SARS-CoV-2/FLU/RSV testing.  Fact Sheet for Patients: EntrepreneurPulse.com.au  Fact Sheet for Healthcare Providers: IncredibleEmployment.be  This test is not yet approved or cleared by the [place] FDA and has been authorized for detection and/or diagnosis of SARS-CoV-2 by FDA under an Emergency Use Authorization (EUA). This EUA will remain in effect (meaning this test can be used) for the duration of the COVID-19 declaration under Section 564(b)(1) of the Act, 21 U.S.C. section 360bbb-3(b)(1), unless the authorization is terminated or revoked.  Performed at [facility], [place] [address], [place], [place] [address]   Troponin I (High Sensitivity)     Status: Abnormal   Collection Time: 12/14/20 11:39 AM  Result Value Ref Range   Troponin I (High Sensitivity) 155 (HH) <18 ng/L    Comment: CRITICAL VALUE NOTED.  VALUE IS CONSISTENT WITH PREVIOUSLY REPORTED AND CALLED VALUE. (NOTE) Elevated high sensitivity troponin I (hsTnI) values and significant  changes across serial measurements may suggest ACS but many other  chronic and acute conditions are known to elevate hsTnI results.  Refer to the Links section for chest pain algorithms and additional  guidance. Performed at [facility], [place] [address] [place], [place] [address]   CBG monitoring, ED     Status: Abnormal  Collection Time: 12/14/20  5:29 PM  Result Value Ref Range   Glucose-Capillary 186 (H) 70 - 99 mg/dL    Comment: Glucose reference range applies only to samples taken after fasting for at least 8 hours.  Glucose, capillary     Status: Abnormal   Collection Time: 12/14/20 11:33 PM  Result Value Ref Range   Glucose-Capillary 205 (H) 70 - 99 mg/dL    Comment: Glucose reference range applies  only to samples taken after fasting for at least 8 hours.  HIV Antibody (routine testing w rflx)     Status: None   Collection Time: 12/15/20  1:47 AM  Result Value Ref Range   HIV Screen 4th Generation wRfx Non Reactive Non Reactive    Comment: Performed at [facility], [place] [address] [place], [place] [address]  Magnesium     Status: None   Collection Time: 12/15/20  1:47 AM  Result Value Ref Range   Magnesium 2.4 1.7 - 2.4 mg/dL    Comment: Performed at [facility] [address], [place], [place] [address]  D-dimer, quantitative (not at [facility])     Status: Abnormal   Collection Time: 12/15/20  1:47 AM  Result Value Ref Range   D-Dimer, Quant 3.15 (H) 0.00 - 0.50 ug/mL-FEU    Comment: (NOTE) At the manufacturer cut-off value of 0.5 g/mL FEU, this assay has a negative predictive value of 95-100%.This assay is intended for use in conjunction with a clinical pretest probability (PTP) assessment model to exclude pulmonary embolism (PE) and deep venous thrombosis (DVT) in outpatients suspected of PE or DVT. Results should be correlated with clinical presentation. Performed at [facility], [place] [address], [place], [place] [id number]   Basic metabolic panel     Status: Abnormal   Collection Time: 12/15/20  1:47 AM  Result Value Ref Range   Sodium 135 135 - 145 mmol/L   Potassium 3.4 (L) 3.5 - 5.1 mmol/L   Chloride 96 (L) 98 - 111 mmol/L   CO2 25 22 - 32 mmol/L   Glucose, Bld 230 (H) 70 - 99 mg/dL    Comment: Glucose reference range applies only to samples taken after fasting for at least 8 hours.   BUN 101 (H) 8 - 23 mg/dL   Creatinine, Ser 5.03 (H) 0.61 - 1.24 mg/dL   Calcium 8.1 (L) 8.9 - 10.3 mg/dL   GFR, Estimated 12 (L) >60 mL/min    Comment: (NOTE) Calculated using the CKD-EPI Creatinine Equation (2021)    Anion gap 14 5 - 15    Comment: Performed at [facility] [address], [place], [place] [address]  Glucose, capillary     Status: Abnormal    Collection Time: 12/15/20  7:59 AM  Result Value Ref Range   Glucose-Capillary 177 (H) 70 - 99 mg/dL    Comment: Glucose reference range applies only to samples taken after fasting for at least 8 hours.   Comment 1 Glucose Stabilizer   CBC with Differential/Platelet     Status: Abnormal   Collection Time: 12/15/20  9:01 AM  Result Value Ref Range   WBC 9.8 4.0 - 10.5 K/uL   RBC 4.70 4.22 - 5.81 MIL/uL   Hemoglobin 11.0 (L) 13.0 - 17.0 g/dL   HCT 36.1 (L) 39.0 - 52.0 %   MCV 76.8 (L) 80.0 - 100.0 fL   MCH 23.4 (L) 26.0 - 34.0 pg   MCHC 30.5 30.0 - 36.0  g/dL   RDW 16.8 (H) 11.5 - 15.5 %   Platelets 174 150 - 400 K/uL   nRBC 0.0 0.0 - 0.2 %   Neutrophils Relative % 84 %   Neutro Abs 8.3 (H) 1.7 - 7.7 K/uL   Lymphocytes Relative 8 %   Lymphs Abs 0.8 0.7 - 4.0 K/uL   Monocytes Relative 6 %   Monocytes Absolute 0.6 0.1 - 1.0 K/uL   Eosinophils Relative 1 %   Eosinophils Absolute 0.1 0.0 - 0.5 K/uL   Basophils Relative 0 %   Basophils Absolute 0.0 0.0 - 0.1 K/uL   Immature Granulocytes 1 %   Abs Immature Granulocytes 0.09 (H) 0.00 - 0.07 K/uL    Comment: Performed at [facility] [address], [place], [place] [address]  Magnesium     Status: None   Collection Time: 12/15/20  9:01 AM  Result Value Ref Range   Magnesium 2.2 1.7 - 2.4 mg/dL    Comment: Performed at [facility] [address] [place], [place] [address]  Phosphorus     Status: Abnormal   Collection Time: 12/15/20  9:01 AM  Result Value Ref Range   Phosphorus 5.9 (H) 2.5 - 4.6 mg/dL    Comment: Performed at [facility] [address], [place], [place] [address]  D-dimer, quantitative (not at [facility])     Status: Abnormal   Collection Time: 12/15/20  9:01 AM  Result Value Ref Range   D-Dimer, Quant 2.73 (H) 0.00 - 0.50 ug/mL-FEU    Comment: (NOTE) At the manufacturer cut-off value of 0.5 g/mL FEU, this assay has a negative predictive value of 95-100%.This assay is intended for use in  conjunction with a clinical pretest probability (PTP) assessment model to exclude pulmonary embolism (PE) and deep venous thrombosis (DVT) in outpatients suspected of PE or DVT. Results should be correlated with clinical presentation. Performed at [facility], [place] [address], [place], [place] [address]   Glucose, capillary     Status: Abnormal   Collection Time: 12/15/20 12:41 PM  Result Value Ref Range   Glucose-Capillary 181 (H) 70 - 99 mg/dL    Comment: Glucose reference range applies only to samples taken after fasting for at least 8 hours.  Troponin I (High Sensitivity)     Status: Abnormal   Collection Time: 12/15/20  2:31 PM  Result Value Ref Range   Troponin I (High Sensitivity) 104 (HH) <18 ng/L    Comment: CRITICAL VALUE NOTED.  VALUE IS CONSISTENT WITH PREVIOUSLY REPORTED AND CALLED VALUE. (NOTE) Elevated high sensitivity troponin I (hsTnI) values and significant  changes across serial measurements may suggest ACS but many other  chronic and acute conditions are known to elevate hsTnI results.  Refer to the Links section for chest pain algorithms and additional  guidance. Performed at [facility], [place] [address], [place], [place] [address]   Troponin I (High Sensitivity)     Status: Abnormal   Collection Time: 12/15/20  4:39 PM  Result Value Ref Range   Troponin I (High Sensitivity) 104 (HH) <18 ng/L    Comment: CRITICAL VALUE NOTED.  VALUE IS CONSISTENT WITH PREVIOUSLY REPORTED AND CALLED VALUE. (NOTE) Elevated high sensitivity troponin I (hsTnI) values and significant  changes across serial measurements may suggest ACS but many other  chronic and acute conditions are known to elevate hsTnI results.  Refer to the Links section for chest pain algorithms and additional  guidance. Performed  at [facility], [place] [address] [place], [place] [address]   Glucose, capillary     Status: Abnormal   Collection Time: 12/15/20  4:46 PM  Result Value Ref  Range   Glucose-Capillary 211 (H) 70 - 99 mg/dL    Comment: Glucose reference range applies only to samples taken after fasting for at least 8 hours.     ROS:  A comprehensive review of systems was negative except for: Constitutional: positive for fatigue Respiratory: positive for cough and dyspnea on exertion  Physical Exam: Vitals:   12/15/20 1456 12/15/20 1500  BP:  (!) 156/102  Pulse:  88  Resp:    Temp:    SpO2: 96% 99%     General: Thin black male, mild distress secondary to not feeling well, tremulous HEENT: Pupils are equal round and reactive to light, extraocular motions are intact, mucous membranes are moist Neck: No obvious JVD Heart: Right now irregularly irregular Lungs: Decreased breath sounds at the bases, deep breathing induces coughing Abdomen: Soft, nontender, nondistended Extremities: Some pitting edema to dependent areas, no peripheral edema Skin: Warm and dry Neuro: Some difficulty with speech, wonder if residual from CVA or just because he is feeling poorly  Assessment/Plan: 65 year old black male with diabetes, malignant hypertension and progressive worsening of renal function although data is sporadic 1.Renal-we know  that in 2019 creatinine was 1.8, and in late October 2021 creatinine was 2.6 in the setting of a very high blood pressure.  In the last 4 months there has been a change of kidney function.  Unknown if this was slow or acute.  Is difficult to identify any recent nephrotoxins.  Going to order a renal ultrasound and urinalysis to evaluate.  Presents now again with malignant hypertension.  The priorities would be to regulate blood pressure and see how kidney function responds.  He has no acute indications for dialysis at this time.  Unclear if he may have chronic indications.  I did discuss him that this would be a worse case scenario situation that he may require dialysis this hospitalization 2. Hypertension/volume  -malignant hypertension which  appears to be a theme.  There is evidence of volume overload with pitting edema to dependent areas and appearance of chest x-ray as well as malignant hypertension.  I do not think it is a bad idea to diurese him, will order some moderate dose of IV Lasix.  Otherwise, continue with carvedilol and hydralazine 3. Anemia  -does have anemia to a certain extent, but not as low as I would expect back for chronically progressive chronic kidney disease.  No action is needed right now 4.  Hypokalemia-has received replacement.  May need more with moderate dose Lasix- will follow and replete as needed  5.  A. Fib- could be causing some variable renal perfusion-cardiology is on board   Louis Meckel 12/15/2020, 6:06 PM

## 2020-12-15 NOTE — Consult Note (Signed)
Cardiology Consultation:   Patient ID: Ruben Camacho. MRN: [id number]; DOB: 07-23-1956  Admit date: 12/14/2020 Date of Consult: 12/15/2020  PCP:  Horald Pollen, [place]  Cardiologist:  New (Dr. Audie Box) Advanced Practice Provider:  No care team member to display Electrophysiologist:  None   Patient Profile:   Ruben Camacho. is a 65 y.o. male with a history of stroke in 2013, hypertension, hyperlipidemia, type 2 diabetes, CKD stage III, migraines, and gout who is being seen today for the evaluation of CHF and tachycardia at the request of Dr. Alfredia Ferguson.  History of Present Illness:   [name] is a 65 year old male with the above history. Patient was admitted to [facility] in 06/2019 for hypertensive urgency (due to non-compliance with medications) and AKI after presenting with fatigue. Systolic BP was 051. He was found to have indeterminate troponin elevation at the time. Therefore, Cardiology was consulted. He underwent nuclear stress test which was negative for any inducible ischemia. Echo at that time showed normal LV function. Renal function improved with IV hydration. He was restarted on antihypertensives. Also diagnosed with diabetes during that admission with hemoglobin A1c of 7.8. He was started on Glipizide. He was advised to follow-up with Cardiology after discharge but it does not look like he has been seen by Cardiology since that time. He reports he has not seen his PCP in over 1 year until yesterday. He has not been taking any medications in at least the last month as he has not been able to afford them.  Patient presented to the ED in 07/2020 with right sided weakness and slurred speech. He was severely hypertensive on arrival with BP of 249/160. He reported not taking his medications for the last 6 weeks. Head CT showed no acute intracranial findings. Brain MRI and head/neck MRA also unremarkable. He was seen by  Neurology. Symptoms not felt to be consistent with stroke. He was treated for a migraine and restarted on home BP medications and started to feel much better so he was discharged from ED.   Patient was sent to the ED yesterday via EMS for further evaluation of shortness of breath. Patient reports he was in his usual state of health until 2 week ago when he noticed swelling in his hands. He states he was seen by a medical provider who gave him a prescription for rheumatoid arthritis and swelling improved. He is unsure what medication this was. Unclear where patient was seen. Nothing seen in [facility] system or [facility] Everywhere. He then states he has had significant weakness for the past week. He described decreased exercise tolerance and reports he gets very easily fatigued with activity. He reports significant shortness of breath with minimal activity as well as at rest. He first states shortness of breath started 1 week ago but then stated it has been going on for longer. He describes orthopnea and states he has not been able to sleep due to his breathing. He also reports mild pedal edema and decreased appetite/early satiety. He denies any chest pain. No palpitations, lightheadedness, dizziness, or syncope. No abnormal bleeding in urine or stools.  He notes he felt like he had a fever last week and also has had a non-productive cough; therefore, he went to go get tested for COVID last week and it was negative. No known sick contacts. He has been vaccinated but has not received the booster.  He was schedule to see his  PCP yesterday afternoon for further evaluation of shortness of breath. However, his breathing was so bad he did not feel like he could wait that long. So he went to his PCP's office at 8:00am where he was noted to have audible wheezing and was only able to speak a few words a time. He was also extremely week. O2 sats were 88% on room air and he was placed on O2. He was treated with albuterol nebulizer  and wheezing improved. He was placed on 2L of nasal cannula and O2 sats improved. Blood glucose was noted to be 324. EMS was called and he was transported to the ED.   Upon arrival to the ED, patient markedly hypertensive with BP of 255/156 and tachypneic. O2 sats in the high 90's on 3L via nasal cannula. EKG showed normal sinus rhythm, rate 92 bpm, with LVH, elevated J point in V3-V6 with biphasic T wave in V4-V6 and T wave inversions in I and aVL (prior EKG from 07/2020 showed diffuse T wave inversions in inferior and anterolateral leads). High-sensitivity troponin elevated at 166 >> 155. BNP elevated at 3,171. Chest x-ray showed cardiomegaly with bilateral airspace disease (right > left). WBC 9.2, Hgb 11.7, Plts 200. Na 133, K 3.1, Glucose 262, BUN 96, Cr 5.03. Anion gap 16. Albumin 3.1, AST 30, ALT 49, Alk Phos 84, Total Bili 0.9.  COVID-19 testing negative. Patient was started on Cardene drip and admitted to Internal Medicine service for hypertensive emergency, acute CHF, and AKI. BP dropped too fast so this was ultimately stopped. Patient was started on IV Lasix. Cardiology consulted for further evaluation of CHF.  At the time of this evaluation, patient is still extremely weak but is off nasal cannula with O2 sats in the mid 90's. He feels like he is breathing a little better since receiving IV Lasix.  He denies any tobacco, alcohol, or drug use. He denies any family history of heart disease.   Patient has Amlodipine 81m daily, Nifedipine 947mdaily, and Losartan-HCTZ 100-2591maily listed under PTA medications. I called and spoke with Pharmacist. Amlodipine was last filled in 07/2020. Nifedipine was last filled in 07/2019. Losartan-HCTZ was last filled in 03/2020.  Past Medical History:  Diagnosis Date  . Gout    1-2 exacerbations per year.  Takes Advil PRN for pain.  . HMarland Kitchenperlipidemia   . Hypertension   . Migraine   . [place]   L sided weakness, slurred speech.  Admission  [facility].    . Vitamin D deficiency 08/09/2019    Past Surgical History:  Procedure Laterality Date  . Admission  07/01/2012   CVA (L sided weakness, slurred speech).  [facility].  . COLONOSCOPY  10/31/2008   no polyps.  Repeat in 10 years.  [place].  . HMarland KitchenRNIA REPAIR       Home Medications:  Prior to Admission medications   Medication Sig Start Date End Date Taking? Authorizing Provider  amLODipine (NORVASC) 10 MG tablet Take 1 tablet (10 mg total) by mouth daily. 08/25/20  Yes MilNoemi ChapelD  aspirin 81 MG tablet Take 81 mg by mouth daily.   Yes [provider]  atorvastatin (LIPITOR) 40 MG tablet Take 1 tablet (40 mg total) by mouth daily at 6 PM. 08/25/20 11/23/20 Yes MilNoemi ChapelD  azithromycin (ZITHROMAX) 250 MG tablet Take 250-500 mg by mouth as directed. 12/10/20  Yes [provider]  diclofenac Sodium (VOLTAREN) 1 % GEL Apply 4 g topically 4 (four) times daily.  Patient taking differently: Apply 4 g topically 4 (four) times daily as needed (pain). 08/10/20  Yes Deno Etienne, DO  glipiZIDE (GLUCOTROL XL) 2.5 MG 24 hr tablet TAKE 1 TABLET(2.5 MG) BY MOUTH DAILY WITH BREAKFAST Patient not taking: No sig reported 08/25/20   Noemi Chapel, MD  glucose blood (PRECISION QID TEST) test strip Use as directed to check blood sugar 2 times a day before meals. 06/14/19   [provider]  Lancet Devices (SIMPLE DIAGNOSTICS LANCING DEV) MISC Use as directed to check blood sugar 2 times a day before meals. 06/14/19   [provider]  Lancets 30G MISC Use as directed to check blood sugar 2 times a day before meals. 06/14/19   [provider]  losartan-hydrochlorothiazide (HYZAAR) 100-25 MG tablet Take 1 tablet by mouth daily. 08/25/20 09/24/20  Noemi Chapel, MD  NIFEdipine (PROCARDIA XL/NIFEDICAL-XL) 90 MG 24 hr tablet Take 1 tablet (90 mg total) by mouth daily. 08/25/20 09/24/20  Noemi Chapel, MD  colchicine 0.6 MG tablet Take one pill an hour after your  first dose in the ED Patient not taking: Reported on 08/25/2020 08/10/20 08/25/20  Deno Etienne, DO    Inpatient Medications: Scheduled Meds: . amLODipine  10 mg Oral Daily  . aspirin EC  81 mg Oral Daily  . atorvastatin  40 mg Oral q1800  . [START ON 12/16/2020] feeding supplement  237 mL Oral BID BM  . furosemide  40 mg Intravenous Q12H  . heparin  5,000 Units Subcutaneous Q12H  . hydrALAZINE  25 mg Oral Q8H  . influenza vac split quadrivalent PF  0.5 mL Intramuscular Tomorrow-1000  . insulin aspart  0-9 Units Subcutaneous TID WC  . ipratropium  0.5 mg Nebulization Q6H  . isosorbide mononitrate  30 mg Oral Daily  . levalbuterol  0.63 mg Nebulization Q6H  . pneumococcal 23 valent vaccine  0.5 mL Intramuscular Tomorrow-1000  . sodium chloride flush  3 mL Intravenous Q12H   Continuous Infusions: . sodium chloride     PRN Meds: sodium chloride, acetaminophen, benzonatate, chlorpheniramine-HYDROcodone, guaiFENesin-dextromethorphan, hydrALAZINE, ondansetron (ZOFRAN) IV, sodium chloride flush  Allergies:   No Known Allergies  Social History:   Social History   Socioeconomic History  . Marital status: Married    Spouse name: Not on file  . Number of children: 2  . Years of education: Masters  . Highest education level: Not on file  Occupational History  . Occupation: PRINCIPAL    Employer: [employer]  Tobacco Use  . Smoking status: Never Smoker  . Smokeless tobacco: Never Used  Substance and Sexual Activity  . Alcohol use: No    Alcohol/week: 0.0 standard drinks    Comment: former drinker  . Drug use: No  . Sexual activity: Yes    Partners: Female  Other Topics Concern  . Not on file  Social History Narrative   Marital status: married      CHildren;  2 children; no grandchildren.      Lives at home alone.      Employment:  Pharmacist, hospital; principal in past.      Tobacco: none      Alcohol: weekends      Exercise:  Walking; weightlifting; basketball.   Right  handed.   2 cups caffeine/day.   Social Determinants of Health   Financial Resource Strain: Medium Risk  . Difficulty of Paying Living Expenses: Somewhat hard  Food Insecurity: No Food Insecurity  . Worried About Charity fundraiser in the  Last Year: Never true  . Ran Out of Food in the Last Year: Never true  Transportation Needs: No Transportation Needs  . Lack of Transportation (Medical): No  . Lack of Transportation (Non-Medical): No  Physical Activity: Inactive  . Days of Exercise per Week: 0 days  . Minutes of Exercise per Session: 0 min  Stress: Not on file  Social Connections: Not on file  Intimate Partner Violence: Not on file    Family History:   Family History  Problem Relation Age of Onset  . Stroke Mother   . Hypertension Mother   . Hypertension Daughter   . Cancer Maternal Grandmother        lung  . Healthy Father        Died in motorcycle accident     ROS:  Please see the history of present illness.  All other ROS reviewed and negative.     Physical Exam/Data:   Vitals:   12/15/20 0635 12/15/20 0900 12/15/20 1456 12/15/20 1500  BP:    (!) 156/102  Pulse:    88  Resp:      Temp:      TempSrc:      SpO2:  96% 96% 99%  Weight: 73.4 kg     Height:        Intake/Output Summary (Last 24 hours) at 12/15/2020 1637 Last data filed at 12/15/2020 0350 Gross per 24 hour  Intake 123 ml  Output 475 ml  Net -352 ml   Last 3 Weights 12/15/2020 12/14/2020 08/25/2020  Weight (lbs) 161 lb 14.4 oz 160 lb 12.8 oz 185 lb  Weight (kg) 73.437 kg 72.938 kg 83.915 kg     Body mass index is 23.91 kg/m.  General: 65 y.o. male resting comfortably in no acute distress. HEENT: Normocephalic and atraumatic. Sclera clear.  Neck: Supple. No carotid bruits. No JVD. Heart: RRR. Distinct S1 and S2. No murmurs, gallops, or rubs. Radial and distal pedal pulses 2+ and equal bilaterally. Lungs: No increased work of breathing. Decreased breath sounds bilaterally with very mild  crackles in bilateral bases (right > left). Abdomen: Soft, non-distended, and non-tender to palpation. Bowel sounds present. Extremities: No lower extremity edema.    Skin: Warm and dry. Neuro: Alert and oriented x3. No focal deficits. Psych: Flat affect. Responds appropriately.  EKG:  The EKG was personally reviewed and demonstrates: Normal sinus rhythm, rate 92 bpm, with LVH, elevated J point in V3-V6 with biphasic T wave in V4-V6 and T wave inversions in I and aVL.  Telemetry:  Telemetry was personally reviewed and demonstrates: Normal sinus rhythm with baseline rates in the 90's.   Relevant CV Studies:  Echocardiogram 12/14/2020: Impressions: 1. Left ventricular ejection fraction, by estimation, is 40%. The left  ventricle has mild to moderately decreased function. The left ventricle  demonstrates global hypokinesis. There is mild left ventricular  hypertrophy. Left ventricular diastolic  parameters are consistent with Grade II diastolic dysfunction  (pseudonormalization).  2. Right ventricular systolic function is normal. The right ventricular  size is normal. There is normal pulmonary artery systolic pressure. The  estimated right ventricular systolic pressure is 76.2 mmHg.  3. Left atrial size was moderately dilated.  4. The mitral valve is abnormal. Moderate mitral valve regurgitation. No  evidence of mitral stenosis.  5. The aortic valve is tricuspid. Aortic valve regurgitation is not  visualized. No aortic stenosis is present.  6. The inferior vena cava is normal in size with greater than 50%  respiratory  variability, suggesting right atrial pressure of 3 mmHg.  Laboratory Data:  High Sensitivity Troponin:   Recent Labs  Lab 12/14/20 0952 12/14/20 1139 12/15/20 1431  TROPONINIHS 166* 155* 104*     Chemistry Recent Labs  Lab 12/14/20 0952 12/15/20 0147  NA 133* 135  K 3.1* 3.4*  CL 93* 96*  CO2 24 25  GLUCOSE 262* 230*  BUN 96* 101*  CREATININE 5.03*  5.03*  CALCIUM 8.4* 8.1*  GFRNONAA 12* 12*  ANIONGAP 16* 14    Recent Labs  Lab 12/14/20 0952  PROT 6.5  ALBUMIN 3.1*  AST 30  ALT 49*  ALKPHOS 84  BILITOT 0.9   Hematology Recent Labs  Lab 12/14/20 0952 12/15/20 0901  WBC 9.2 9.8  RBC 4.99 4.70  HGB 11.7* 11.0*  HCT 38.1* 36.1*  MCV 76.4* 76.8*  MCH 23.4* 23.4*  MCHC 30.7 30.5  RDW 16.9* 16.8*  PLT 200 174   BNP Recent Labs  Lab 12/14/20 0952  BNP 3,171.0*    DDimer  Recent Labs  Lab 12/14/20 0942 12/15/20 0147 12/15/20 0901  DDIMER 4.41* 3.15* 2.73*     Radiology/Studies:  DG Chest 1 View  Result Date: 12/15/2020 CLINICAL DATA:  Congestive heart failure. EXAM: CHEST  1 VIEW COMPARISON:  12/14/2020. FINDINGS: Mediastinum hilar structures normal. Cardiomegaly with pulmonary venous congestion again noted. Bilateral pulmonary infiltrates/edema right side greater than left again noted without interim change. No pleural effusion or pneumothorax. IMPRESSION: 1. Cardiomegaly with pulmonary venous congestion again noted. 2. Bilateral pulmonary infiltrates/edema right side greater than left again noted. Electronically Signed   By: Marcello Moores  Register   On: 12/15/2020 07:57   DG Chest Port 1 View  Result Date: 12/14/2020 CLINICAL DATA:  Shortness of breath, cough, hypertensive EXAM: PORTABLE CHEST 1 VIEW COMPARISON:  12/16/2015 FINDINGS: Cardiomegaly. Bilateral airspace disease, right greater than left. No visible effusions or pneumothorax. No acute bony abnormality. IMPRESSION: Bilateral airspace disease, right greater than left, favor multifocal pneumonia. Electronically Signed   By: Rolm Baptise M.D.   On: 12/14/2020 10:01   ECHOCARDIOGRAM COMPLETE  Result Date: 12/14/2020    ECHOCARDIOGRAM REPORT   Patient Name:   Ruben Camacho. Date of Exam: 12/14/2020 Medical Rec #:  [id number]          Height:       69.0 in Accession #:    [id number]         Weight:       185.0 lb Date of Birth:  [birth year]          BSA:           1.999 m Patient Age:    8 years           BP:           173/102 mmHg Patient Gender: M                  HR:           73 bpm. Exam Location:  Inpatient Procedure: 2D Echo, Cardiac Doppler and Color Doppler Indications:    CHF-Acute Diastolic  History:        Patient has no prior history of Echocardiogram examinations.                 Signs/Symptoms:Shortness of Breath; Risk Factors:Hypertension,                 Diabetes and Dyslipidemia. CKD.  Sonographer:    Clayton Lefort RDCS (AE)  Referring Phys: [id number] [place]  1. Left ventricular ejection fraction, by estimation, is 40%. The left ventricle has mild to moderately decreased function. The left ventricle demonstrates global hypokinesis. There is mild left ventricular hypertrophy. Left ventricular diastolic parameters are consistent with Grade II diastolic dysfunction (pseudonormalization).  2. Right ventricular systolic function is normal. The right ventricular size is normal. There is normal pulmonary artery systolic pressure. The estimated right ventricular systolic pressure is 58.8 mmHg.  3. Left atrial size was moderately dilated.  4. The mitral valve is abnormal. Moderate mitral valve regurgitation. No evidence of mitral stenosis.  5. The aortic valve is tricuspid. Aortic valve regurgitation is not visualized. No aortic stenosis is present.  6. The inferior vena cava is normal in size with greater than 50% respiratory variability, suggesting right atrial pressure of 3 mmHg. FINDINGS  Left Ventricle: Left ventricular ejection fraction, by estimation, is 40%. The left ventricle has mild to moderately decreased function. The left ventricle demonstrates global hypokinesis. The left ventricular internal cavity size was normal in size. There is mild left ventricular hypertrophy. Left ventricular diastolic parameters are consistent with Grade II diastolic dysfunction (pseudonormalization). Right Ventricle: The right ventricular size is normal. No  increase in right ventricular wall thickness. Right ventricular systolic function is normal. There is normal pulmonary artery systolic pressure. The tricuspid regurgitant velocity is 2.43 m/s, and  with an assumed right atrial pressure of 3 mmHg, the estimated right ventricular systolic pressure is 50.2 mmHg. Left Atrium: Left atrial size was moderately dilated. Right Atrium: Right atrial size was normal in size. Pericardium: There is no evidence of pericardial effusion. Mitral Valve: The mitral valve is abnormal. Moderate mitral valve regurgitation. No evidence of mitral valve stenosis. MV peak gradient, 4.2 mmHg. The mean mitral valve gradient is 1.0 mmHg. Tricuspid Valve: The tricuspid valve is normal in structure. Tricuspid valve regurgitation is trivial. Aortic Valve: The aortic valve is tricuspid. Aortic valve regurgitation is not visualized. No aortic stenosis is present. Aortic valve mean gradient measures 2.0 mmHg. Aortic valve peak gradient measures 3.3 mmHg. Aortic valve area, by VTI measures 2.82 cm. Pulmonic Valve: The pulmonic valve was normal in structure. Pulmonic valve regurgitation is trivial. Aorta: The aortic root is normal in size and structure. Venous: The inferior vena cava is normal in size with greater than 50% respiratory variability, suggesting right atrial pressure of 3 mmHg. IAS/Shunts: No atrial level shunt detected by color flow Doppler.  LEFT VENTRICLE PLAX 2D LVIDd:         4.00 cm      Diastology LVIDs:         3.60 cm      LV e' medial:    5.11 cm/s LV PW:         1.80 cm      LV E/e' medial:  18.4 LV IVS:        1.40 cm      LV e' lateral:   5.98 cm/s LVOT diam:     2.20 cm      LV E/e' lateral: 15.7 LV SV:         40 LV SV Index:   20 LVOT Area:     3.80 cm  LV Volumes (MOD) LV vol d, MOD A2C: 147.0 ml LV vol d, MOD A4C: 138.0 ml LV vol s, MOD A2C: 82.2 ml LV vol s, MOD A4C: 81.1 ml LV SV MOD A2C:     64.8 ml LV SV MOD A4C:  138.0 ml LV SV MOD BP:      59.3 ml RIGHT  VENTRICLE             IVC RV Basal diam:  2.80 cm     IVC diam: 1.80 cm RV S prime:     11.90 cm/s TAPSE (M-mode): 2.1 cm LEFT ATRIUM             Index       RIGHT ATRIUM           Index LA diam:        4.10 cm 2.05 cm/m  RA Area:     17.10 cm LA Vol (A2C):   85.5 ml 42.78 ml/m RA Volume:   42.60 ml  21.31 ml/m LA Vol (A4C):   71.2 ml 35.62 ml/m LA Biplane Vol: 81.0 ml 40.53 ml/m  AORTIC VALVE AV Area (Vmax):    2.63 cm AV Area (Vmean):   2.90 cm AV Area (VTI):     2.82 cm AV Vmax:           91.40 cm/s AV Vmean:          59.800 cm/s AV VTI:            0.140 m AV Peak Grad:      3.3 mmHg AV Mean Grad:      2.0 mmHg LVOT Vmax:         63.30 cm/s LVOT Vmean:        45.600 cm/s LVOT VTI:          0.104 m LVOT/AV VTI ratio: 0.74  AORTA Ao Root diam: 3.40 cm Ao Asc diam:  3.40 cm MITRAL VALVE                 TRICUSPID VALVE MV Area (PHT): 4.29 cm      TR Peak grad:   23.6 mmHg MV Area VTI:   1.86 cm      TR Vmax:        243.00 cm/s MV Peak grad:  4.2 mmHg MV Mean grad:  1.0 mmHg      SHUNTS MV Vmax:       1.03 m/s      Systemic VTI:  0.10 m MV Vmean:      53.6 cm/s     Systemic Diam: 2.20 cm MV Decel Time: 177 msec MR Peak grad:    146.9 mmHg MR Mean grad:    96.0 mmHg MR Vmax:         606.00 cm/s MR Vmean:        462.0 cm/s MR PISA:         1.57 cm MR PISA Eff ROA: 10 mm MR PISA Radius:  0.50 cm MV E velocity: 93.80 cm/s MV A velocity: 36.00 cm/s MV E/A ratio:  2.61 Loralie Champagne MD Electronically signed by Loralie Champagne MD Signature Date/Time: 12/14/2020/5:37:39 PM    Final      Assessment and Plan:   Hypertensive Emergency - Patient presented with shortness of breath likely due to hypertensive emergency. - BP as high as 255/156 in the ED. He has not been taking any home BP medications for at least the last month as he has been unable to afford them.  Initially started on Cardene drip but BP dropped too quickly so this was stopped. Oral medication have been started - BP still elevated but  improved. - Continue Amlodipine 65m daily. - Will increase Hydralazine to 1077mthree times daily. -  Will increase Imdur to 45m daily. - Will start Coreg 232mtwice daily.  Of note, patient has Amlodipine 1066maily, Nifedipine 66m85mily, and Losartan-HCTZ 100-25mg59mly listed under PTA medications. I called and spoke with Pharmacist. Amlodipine was last filled in 07/2020. Nifedipine was last filled in 07/2019. Losartan-HCTZ was last filled in 03/2020.  Likely Hypertensive Cardiomyopathy - Patient presented with shortness of breath and significant weakness. - BNP elevated in the 3,000's.  - Chest x-ray showed cardiomegaly with bilateral airspace disease (right > left). - Echo showed LVEF of 40% with global hypokinesis, mild LVH, grade 2 diastolic dysfunction, and moderate MR. RV function normal.  - She has received two doses of Lasix 40mg.71my 475 mL of documented urinary output. Creatinine 5.03 but unchanged from yesterday. - He appears euvolemic on exam. Will stop Lasix. He has been losing weight and reports nausea at home. Concerned that he is uremic. Recommend Nephrology consultation for discussion of dialysis. - No ACE/ARB/ARNI or MRA due to renal function.  - Will add Coreg as above. - Continue to monitor daily weights, strict I/O's, and renal function.  - Suspect reduced EF is due to hypertensive cardiomyopathy. He does have risk factor for CAD including HTN, HLD, DM, and stroke. Could consider ischemic evaluation as an outpatient once renal function improves.  Elevated Troponin  - High-sensitivity troponin elevated at 166 >> 155.  - EKG showed normal sinus rhythm, rate 92 bpm, with LVH, elevated J point in V3-V6 with biphasic T wave in V4-V6 and T wave inversions in I and aVL (prior EKG from 07/2020 showed diffuse T wave inversions in inferior and anterolateral leads).  - Echo showed EF of 40% with global hypokinesis. - No chest pain. - Suspect demand ischemia in the setting of  hypertensive emergency, acute CHF, and AKI. History of normal nuclear stress test in 06/2019 at outside hospital. Not currently a candidate for cardiac catheterization or coronary CTA given AKI.  SVT (Likely Atrial Tachycardia) - Few episodes of SVT with rates in the 140's noted on telemetry. Like atrial tachycardia. - Will start beta-blocker as above.  Hyperlipidemia - Will check fasting lipid panel. - Previously on Lipitor 40mg d32m but has not been taking this recently. This has been restarted this admission.  Type 2 Diabetes Mellitus - POCT glucose initially in the 300's. Previously on Glipizide but patient not current taking any medications. - Hemoglobin A1c pending. - Management per primary team.  Acute on CKD Stage III - Creatinine 5.03 on admission. Baseline around 2.1 to 2.6. - Avoid Nephrotoxic agents.  - Continue to monitor closely.  - Recommend consulting Nephrology.  Elevated D-Dimer - D-dimer elevated but trending down: 4.41 >> 3.15 >> 2.73. - Management per primary team.   Risk Assessment/Risk Scores:   New [employer] Heart Association (NYHA) Functional Class NYHA Class IV   For questions or updates, please contact [facility] Please consult [email or web address] for contact info under    Signed, Callie Darreld Mclean 12/15/2020 4:37 PM

## 2020-12-16 ENCOUNTER — Inpatient Hospital Stay (HOSPITAL_COMMUNITY): Payer: 59

## 2020-12-16 DIAGNOSIS — I5021 Acute systolic (congestive) heart failure: Secondary | ICD-10-CM | POA: Diagnosis not present

## 2020-12-16 DIAGNOSIS — I471 Supraventricular tachycardia: Secondary | ICD-10-CM | POA: Diagnosis not present

## 2020-12-16 LAB — CBC WITH DIFFERENTIAL/PLATELET
Abs Immature Granulocytes: 0.11 10*3/uL — ABNORMAL HIGH (ref 0.00–0.07)
Basophils Absolute: 0 10*3/uL (ref 0.0–0.1)
Basophils Relative: 0 %
Eosinophils Absolute: 0.1 10*3/uL (ref 0.0–0.5)
Eosinophils Relative: 1 %
HCT: 38.4 % — ABNORMAL LOW (ref 39.0–52.0)
Hemoglobin: 11.5 g/dL — ABNORMAL LOW (ref 13.0–17.0)
Immature Granulocytes: 1 %
Lymphocytes Relative: 9 %
Lymphs Abs: 0.8 10*3/uL (ref 0.7–4.0)
MCH: 23.2 pg — ABNORMAL LOW (ref 26.0–34.0)
MCHC: 29.9 g/dL — ABNORMAL LOW (ref 30.0–36.0)
MCV: 77.4 fL — ABNORMAL LOW (ref 80.0–100.0)
Monocytes Absolute: 0.5 10*3/uL (ref 0.1–1.0)
Monocytes Relative: 6 %
Neutro Abs: 7.3 10*3/uL (ref 1.7–7.7)
Neutrophils Relative %: 83 %
Platelets: 192 10*3/uL (ref 150–400)
RBC: 4.96 MIL/uL (ref 4.22–5.81)
RDW: 17.1 % — ABNORMAL HIGH (ref 11.5–15.5)
WBC: 8.9 10*3/uL (ref 4.0–10.5)
nRBC: 0 % (ref 0.0–0.2)

## 2020-12-16 LAB — URINALYSIS, ROUTINE W REFLEX MICROSCOPIC
Bacteria, UA: NONE SEEN
Bilirubin Urine: NEGATIVE
Glucose, UA: 50 mg/dL — AB
Hgb urine dipstick: NEGATIVE
Ketones, ur: NEGATIVE mg/dL
Leukocytes,Ua: NEGATIVE
Nitrite: NEGATIVE
Protein, ur: 100 mg/dL — AB
Specific Gravity, Urine: 1.009 (ref 1.005–1.030)
pH: 6 (ref 5.0–8.0)

## 2020-12-16 LAB — COMPREHENSIVE METABOLIC PANEL
ALT: 36 U/L (ref 0–44)
AST: 22 U/L (ref 15–41)
Albumin: 2.9 g/dL — ABNORMAL LOW (ref 3.5–5.0)
Alkaline Phosphatase: 75 U/L (ref 38–126)
Anion gap: 15 (ref 5–15)
BUN: 101 mg/dL — ABNORMAL HIGH (ref 8–23)
CO2: 23 mmol/L (ref 22–32)
Calcium: 8.6 mg/dL — ABNORMAL LOW (ref 8.9–10.3)
Chloride: 97 mmol/L — ABNORMAL LOW (ref 98–111)
Creatinine, Ser: 4.72 mg/dL — ABNORMAL HIGH (ref 0.61–1.24)
GFR, Estimated: 13 mL/min — ABNORMAL LOW (ref >60)
Glucose, Bld: 195 mg/dL — ABNORMAL HIGH (ref 70–99)
Potassium: 4 mmol/L (ref 3.5–5.1)
Sodium: 135 mmol/L (ref 135–145)
Total Bilirubin: 1 mg/dL (ref 0.3–1.2)
Total Protein: 6.2 g/dL — ABNORMAL LOW (ref 6.5–8.1)

## 2020-12-16 LAB — LIPID PANEL
Cholesterol: 148 mg/dL (ref 0–200)
HDL: 51 mg/dL (ref >40)
LDL Cholesterol: 74 mg/dL (ref 0–99)
Total CHOL/HDL Ratio: 2.9 RATIO
Triglycerides: 114 mg/dL (ref <150)
VLDL: 23 mg/dL (ref 0–40)

## 2020-12-16 LAB — GLUCOSE, CAPILLARY
Glucose-Capillary: 235 mg/dL — ABNORMAL HIGH (ref 70–99)
Glucose-Capillary: 305 mg/dL — ABNORMAL HIGH (ref 70–99)
Glucose-Capillary: 126 mg/dL — ABNORMAL HIGH (ref 70–99)
Glucose-Capillary: 180 mg/dL — ABNORMAL HIGH (ref 70–99)

## 2020-12-16 LAB — PHOSPHORUS: Phosphorus: 5.3 mg/dL — ABNORMAL HIGH (ref 2.5–4.6)

## 2020-12-16 LAB — MAGNESIUM: Magnesium: 2.3 mg/dL (ref 1.7–2.4)

## 2020-12-16 MED ORDER — BENZONATATE 100 MG PO CAPS
200.0000 mg | ORAL_CAPSULE | Freq: Three times a day (TID) | ORAL | Status: DC | PRN
  Administered 2021-01-01: 200 mg via ORAL
  Filled 2020-12-16 (×2): qty 2

## 2020-12-16 MED ORDER — HYDROCOD POLST-CPM POLST ER 10-8 MG/5ML PO SUER
5.0000 mL | Freq: Two times a day (BID) | ORAL | Status: DC | PRN
  Administered 2020-12-27 – 2020-12-29 (×2): 5 mL via ORAL
  Filled 2020-12-16 (×2): qty 5

## 2020-12-16 MED ORDER — GUAIFENESIN-DM 100-10 MG/5ML PO SYRP
5.0000 mL | ORAL_SOLUTION | ORAL | Status: DC | PRN
  Administered 2020-12-26 – 2021-01-01 (×6): 5 mL via ORAL
  Filled 2020-12-16 (×7): qty 5

## 2020-12-16 MED ORDER — FUROSEMIDE 80 MG PO TABS
80.0000 mg | ORAL_TABLET | Freq: Two times a day (BID) | ORAL | Status: DC
  Administered 2020-12-16 – 2020-12-19 (×6): 80 mg via ORAL
  Filled 2020-12-16 (×6): qty 1

## 2020-12-16 NOTE — Progress Notes (Signed)
PROGRESS NOTE  Ruben Camacho. [id number] DOB: [birth year] DOA: 12/14/2020 PCP: Horald Pollen, MD  Brief History   Ruben Camachois a 65 y.o.malewith medical history significant ofHTN, CKD stage IIIB, IIDM,HLD, presented with increasing shortness of breath.  Symptoms started about 10 to 14 days ago, gradually gotten worse, also has had dry cough and then became more productive in the last 2 to 3 days with whitish foamy sputum, no chest pain no fever chills. Overnight unable to lie down, and unable to catch breath even at rest this morning. Then called EMS.  At baseline, check blood pressure every now and then at home and found most occasions SBP>1 70-1 80, no recent BP medication adjustment. Patient reported he has been compliant with his BP meds. EMS arrived, found patient blood pressure more than 200, glucose 320, O2 saturation 88% on room air then stabilized on 3 L via nasal cannula. ED Course:Pulmonary edema, blood pressure 250,Cardene drip started and BP dropped to 140s in 3 hours. Blood work showed AKI on CKD creatinine 5.0 compared to 2.6 baseline, K3.1, glucose 262. Troponin 166. EKG LVH no acute ST-T changes. Chest x-ray pulmonary edema.  Continues to be significantly dyspneic and cough.  Still appears somewhat volume overloaded.  Very dyspneic to speak.  We will increase her diuresis and consult cardiology.  Lasix 80 mg bid started. Patient still hypertensive today.  Consultants  . Nephrology . Cardiology  Procedures  . None  Antibiotics   Anti-infectives (From admission, onward)   None    .  Interval History/Subjective  The patient is resting in bed. He is complaining of feeling tired.  Objective   Vitals:  Vitals:   12/16/20 1428 12/16/20 1639  BP: 128/77 138/82  Pulse:  74  Resp:  18  Temp:  97.8 F (36.6 C)  SpO2:  95%   Exam:  Constitutional:  . The patient is awake, alert, and oriented x 3. No acute  distress. Respiratory:  . No increased work of breathing. . No wheezes, rales, or rhonchi . No tactile fremitus Cardiovascular:  . Regular rate and rhythm . No murmurs, ectopy, or gallups. . No lateral PMI. No thrills. Abdomen:  . Abdomen is soft, non-tender, non-distended . No hernias, masses, or organomegaly . Normoactive bowel sounds.  Musculoskeletal:  . No cyanosis, clubbing, or edema Skin:  . No rashes, lesions, ulcers . palpation of skin: no induration or nodules Neurologic:  . CN 2-12 intact . Sensation all 4 extremities intact Psychiatric:  . Mental status o Mood, affect appropriate o Orientation to person, place, time  . judgment and insight appear intact  I have personally reviewed the following:   Today's Data  . Vitals, CMP, CBC, lipid panel  Imaging  . CXR  Cardiology Data  . EKG . Echocardiogram  Scheduled Meds: . aspirin EC  81 mg Oral Daily  . atorvastatin  40 mg Oral q1800  . carvedilol  25 mg Oral BID WC  . feeding supplement  237 mL Oral BID BM  . furosemide  80 mg Oral BID  . heparin  5,000 Units Subcutaneous Q12H  . hydrALAZINE  100 mg Oral Q8H  . influenza vac split quadrivalent PF  0.5 mL Intramuscular Tomorrow-1000  . insulin aspart  0-9 Units Subcutaneous TID WC  . ipratropium  0.5 mg Nebulization BID  . isosorbide mononitrate  60 mg Oral Daily  . levalbuterol  0.63 mg Nebulization BID  . pneumococcal 23 valent vaccine  0.5 mL Intramuscular  Tomorrow-1000  . sodium chloride flush  3 mL Intravenous Q12H   Continuous Infusions: . 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 -BNP on Admission was 3,171.0  -Troponin 166 ->  155 -Continue to monitor BP per Protocol  -Continue Amlodipine 10 mg po Daily, hold his ARB and HCTZ and nifedipine. -Started Hydralazine 10 mg po q8h but will titrate up to 25 mg TID and Imdur 30 mg po Daily  -Lasix 40 mg x 1 yesterday and renewed at 40 mg IV q12h -Strict I's and O's and Daily Weights; Patient is -352 mL -Repeat chest x-ray this AM showed "Cardiomegaly with pulmonary venous congestion again noted. Bilateral pulmonary infiltrates/edema right side greater than left again noted." -Continue to Monitor for S/Sx of Volume Overload Patient remains hypertensive.  Acute Respiratory Failure with Hypoxia from Above The patient was saturating at 95-100% on room air for most of the day. In the last hour the patient has had decreased his SaO2 to 84%. CXR is unchanged. Given DuoNeb x1; Will add Scheduled Xopenex/Atrovent, Flutter Valve and Incentive Spirometry -Continue Supplemental O2 via [place] and Wean O2 as tolerated -Continuous Pulse Oximetry and Maintain O2 Saturations >90% -Will need an Ambulatory Home O2 Screen prior to D/C and repeat CXR in the AM  -Stated Antitussives for Cough   Hypertension Emergency Hypertension is better controlled. -C/w Amlodipine 10 mg po Daily, Increase Hydralazine 10 mg po q8h -> 25 mg po TID, Hold ARB HCTZ and Nifedipine -C/w Imdur 30 mg po Daily  -C/w Lasix Diuresis  -Continue to Monitor BP per Protocol  Most recent BP was 138/82.  Elevated Troponins -Likely demand ischemia from HTN emergency and CHF decompensation as well as AKI. -No significant ST changes on EKG,  -Troponin I went from 166 -> 155 -Will trend troponins and ordered echocardiogram,  -No chest pain,, will not start ACS meds. -Continue aspirin and statin. -Will Consult Cardiology   AKI onCKD stage IIIb Hyperphosphatemia -In the Setting Fluid overload -Patient's BUN/Cr is now gone from 96/5.03 -> 101/5.03> 101/4.72 -Avoid Nephrotoxic Medications, Contrast Dyes, Hypotension  and Renally adjust medications  -Diuresis with caution and repeat chest x-ray  -Continue to Monitor and Trend Renal Fxn -Repeat CMP in the AM   Elevated D-Dimer -Trending down; D-Dimer went from 4.41 -> 3.15 -> 2.73 -No significant PE risk, likely reflect acute cardio-pulmonary changes -Repeat one reading in AM to determine further image study.  Hypokalemia: Resolved.  -K+ was 3.1 and improved to 3.4 -Mag Level was 2.2 -Replete with po KCl 40 mEQ x1 -Continue to Monitor and Replete as Necessary -Repeat CMP in AM   Microcytic Anemia  -Patient's Hgb/Hct went from 11.7/38.1 -> 11.0/36.1 -Check Anemia Panel in the AM -Continue to Monitor for S/Sx of Bleeding; Currently no overt bleeding noted -Repeat CBC in the AM   IIDMwith Hyperglycemia -Hold oral medications -Started SSI with Sensitive Novolog AC -Check HbA1c in the AM -CBGs ranging from 177-205   DVT prophylaxis: Heparin 5,000 units sq q8h given Renal FXn  Code Status: FULL CODE  Family Communication: No family present at bedside  Disposition Plan: Pending further clinical improvement and clearance by Cardiology   Status is: Inpatient  Remains inpatient appropriate  because:Unsafe d/c plan, IV treatments appropriate due to intensity of illness or inability to take PO and Inpatient level of care appropriate due to severity of illness   Dispo: The patient is from: Home  Anticipated d/c is to: TBD  Anticipated d/c date is: 2 days  Patient currently is not medically stable to d/c.              Difficult to place patient No     Lizanne Erker, DO [facility] Direct contact: see [email or web address]  7PM-7AM contact night coverage as above 12/16/2020, 5:36 PM  LOS: 2 days

## 2020-12-16 NOTE — Progress Notes (Signed)
[facility] ROUNDING NOTE   Subjective:   Interval History: This is a 65 year old gentleman with history of diabetes hypertension which has been malignant times, hyperlipidemia history of stroke, gout stage IV chronic kidney disease with a baseline serum creatinine by 2.05 August 2020  He presented with dyspnea and lower extremity swelling.  He was also found to have a blood pressure was elevated to 123XX123 systolic.  His blood sugar was 320 and creatinine elevated at 5 mg/dL with chest x-ray which was consistent with pulmonary edema.  There is reported history of nonsteroidal anti-inflammatory drug use.  Medications do include Hyzaar 100/25 mg daily  Urinalysis showed no white blood cells no red blood cells.  There was greater than 300 mg of proteinuria noted on 08/25/2020 however only 100 mg/dL noted on 12/15/2020 Renal ultrasound showed increased bilateral renal echogenicity no hydronephrosis.  Kidney size appears to be small bilaterally.  Blood pressure 133/95 pulse 79 temperature 97.7 O2 sats 98% room air  Sodium 135 potassium 4 chloride 97 CO2 23 BUN 101 CO2 4.72 glucose 195 calcium 8.6 phosphorus 5.3 albumin 2.9 hemoglobin 11.5  Urine output 550 cc 12/15/2020     Objective:  Vital signs in last 24 hours:  Temp:  [97.7 F (36.5 C)-98.1 F (36.7 C)] 97.7 F (36.5 C) (02/16 0800) Pulse Rate:  [72-88] 79 (02/16 0800) Resp:  [16-20] 16 (02/16 0800) BP: (140-156)/(95-121) 153/95 (02/16 0800) SpO2:  [94 %-99 %] 96 % (02/16 0800) Weight:  [70.4 kg] 70.4 kg (02/16 0530)  Weight change: -2.495 kg Filed Weights   12/14/20 2300 12/15/20 0635 12/16/20 0530  Weight: 72.9 kg 73.4 kg 70.4 kg    Intake/Output: I/O last 3 completed shifts: In: 62 [P.O.:600; I.V.:6] Out: 1850 [Urine:1850]   Intake/Output this shift:  No intake/output data recorded.  General: Thin black male, mild distress secondary to not feeling well, tremulous HEENT: Pupils are equal round and reactive  to light, extraocular motions are intact, mucous membranes are moist Neck: No obvious JVD Heart: Right now irregularly irregular Lungs: Decreased breath sounds at the bases, deep breathing induces coughing Abdomen: Soft, nontender, nondistended Extremities: Some pitting edema to dependent areas, no peripheral edema Skin: Warm and dry Neuro: Some difficulty with speech, wonder if residual from CVA or just because he is feeling    Basic Metabolic Panel: Recent Labs  Lab 12/14/20 0952 12/15/20 0147 12/15/20 0901 12/16/20 0138  NA 133* 135  --  135  K 3.1* 3.4*  --  4.0  CL 93* 96*  --  97*  CO2 24 25  --  23  GLUCOSE 262* 230*  --  195*  BUN 96* 101*  --  101*  CREATININE 5.03* 5.03*  --  4.72*  CALCIUM 8.4* 8.1*  --  8.6*  MG  --  2.4 2.2 2.3  PHOS  --   --  5.9* 5.3*    Liver Function Tests: Recent Labs  Lab 12/14/20 0952 12/16/20 0138  AST 30 22  ALT 49* 36  ALKPHOS 84 75  BILITOT 0.9 1.0  PROT 6.5 6.2*  ALBUMIN 3.1* 2.9*   No results for input(s): LIPASE, AMYLASE in the last 168 hours. No results for input(s): AMMONIA in the last 168 hours.  CBC: Recent Labs  Lab 12/14/20 0952 12/15/20 0901 12/16/20 0138  WBC 9.2 9.8 8.9  NEUTROABS  --  8.3* 7.3  HGB 11.7* 11.0* 11.5*  HCT 38.1* 36.1* 38.4*  MCV 76.4* 76.8* 77.4*  PLT 200 174 192  Cardiac Enzymes: No results for input(s): CKTOTAL, CKMB, CKMBINDEX, TROPONINI in the last 168 hours.  BNP: Invalid input(s): POCBNP  CBG: Recent Labs  Lab 12/15/20 0759 12/15/20 1241 12/15/20 1646 12/15/20 2159 12/16/20 0750  GLUCAP 177* 181* 211* 175* 235*    Microbiology: Results for orders placed or performed during the hospital encounter of 12/14/20  Resp Panel by RT-PCR (Flu A&B, Covid) Nasopharyngeal Swab     Status: None   Collection Time: 12/14/20 11:00 AM   Specimen: Nasopharyngeal Swab; Nasopharyngeal(NP) swabs in vial transport medium  Result Value Ref Range Status   SARS Coronavirus 2 by RT PCR  NEGATIVE NEGATIVE Final    Comment: (NOTE) SARS-CoV-2 target nucleic acids are NOT DETECTED.  The SARS-CoV-2 RNA is generally detectable in upper respiratory specimens during the acute phase of infection. The lowest concentration of SARS-CoV-2 viral copies this assay can detect is 138 copies/mL. A negative result does not preclude SARS-Cov-2 infection and should not be used as the sole basis for treatment or other patient management decisions. A negative result may occur with  improper specimen collection/handling, submission of specimen other than nasopharyngeal swab, presence of viral mutation(s) within the areas targeted by this assay, and inadequate number of viral copies(<138 copies/mL). A negative result must be combined with clinical observations, patient history, and epidemiological information. The expected result is Negative.  Fact Sheet for Patients:  EntrepreneurPulse.com.au  Fact Sheet for Healthcare Providers:  IncredibleEmployment.be  This test is no t yet approved or cleared by the [place] FDA and  has been authorized for detection and/or diagnosis of SARS-CoV-2 by FDA under an Emergency Use Authorization (EUA). This EUA will remain  in effect (meaning this test can be used) for the duration of the COVID-19 declaration under Section 564(b)(1) of the Act, 21 U.S.C.section 360bbb-3(b)(1), unless the authorization is terminated  or revoked sooner.       Influenza A by PCR NEGATIVE NEGATIVE Final   Influenza B by PCR NEGATIVE NEGATIVE Final    Comment: (NOTE) The Xpert Xpress SARS-CoV-2/FLU/RSV plus assay is intended as an aid in the diagnosis of influenza from Nasopharyngeal swab specimens and should not be used as a sole basis for treatment. Nasal washings and aspirates are unacceptable for Xpert Xpress SARS-CoV-2/FLU/RSV testing.  Fact Sheet for Patients: EntrepreneurPulse.com.au  Fact Sheet for  Healthcare Providers: IncredibleEmployment.be  This test is not yet approved or cleared by the [place] FDA and has been authorized for detection and/or diagnosis of SARS-CoV-2 by FDA under an Emergency Use Authorization (EUA). This EUA will remain in effect (meaning this test can be used) for the duration of the COVID-19 declaration under Section 564(b)(1) of the Act, 21 U.S.C. section 360bbb-3(b)(1), unless the authorization is terminated or revoked.  Performed at [facility], [place] [address], [place], [place] [address]     Coagulation Studies: No results for input(s): LABPROT, INR in the last 72 hours.  Urinalysis: Recent Labs    12/15/20 2334  Loving 1.009  PHURINE 6.0  GLUCOSEU 50*  HGBUR NEGATIVE  BILIRUBINUR NEGATIVE  KETONESUR NEGATIVE  PROTEINUR 100*  NITRITE NEGATIVE  LEUKOCYTESUR NEGATIVE      Imaging: DG Chest 1 View  Result Date: 12/15/2020 CLINICAL DATA:  Congestive heart failure. EXAM: CHEST  1 VIEW COMPARISON:  12/14/2020. FINDINGS: Mediastinum hilar structures normal. Cardiomegaly with pulmonary venous congestion again noted. Bilateral pulmonary infiltrates/edema right side greater than left again noted without interim change. No pleural effusion or pneumothorax. IMPRESSION: 1.  Cardiomegaly with pulmonary venous congestion again noted. 2. Bilateral pulmonary infiltrates/edema right side greater than left again noted. Electronically Signed   By: Marcello Moores  Register   On: 12/15/2020 07:57   US RENAL  Result Date: 12/15/2020 CLINICAL DATA:  Acute kidney injury. EXAM: RENAL / URINARY TRACT ULTRASOUND COMPLETE COMPARISON:  None. FINDINGS: Right Kidney: Renal measurements: 8.4 x 5.0 x 4.2 cm = volume: 94 mL. Increased renal parenchymal echogenicity. No mass or hydronephrosis visualized. No visualized renal calculi. Left Kidney: Renal measurements: 8.3 x 4.4 x 3.7 cm = volume: 7 mL. Increased renal parenchymal  echogenicity. There is some cortical scarring in the lateral kidney. No mass or hydronephrosis visualized. No visualized renal calculi. Bladder: Appears normal for degree of bladder distention. Other: None. IMPRESSION: 1. Increased bilateral renal echogenicity consistent with chronic medical renal disease. 2. No hydronephrosis. Electronically Signed   By: Keith Rake M.D.   On: 12/15/2020 21:30   DG CHEST PORT 1 VIEW  Result Date: 12/16/2020 CLINICAL DATA:  Shortness of breath EXAM: PORTABLE CHEST 1 VIEW COMPARISON:  12/15/2020 FINDINGS: Cardiomegaly. Airspace disease throughout the right lung and in the left lung base are unchanged since prior study. No visible effusions or pneumothorax. No acute bony abnormality. IMPRESSION: Bilateral airspace disease, right greater than left again noted, unchanged. Electronically Signed   By: Rolm Baptise M.D.   On: 12/16/2020 06:59   DG Chest Port 1 View  Result Date: 12/14/2020 CLINICAL DATA:  Shortness of breath, cough, hypertensive EXAM: PORTABLE CHEST 1 VIEW COMPARISON:  12/16/2015 FINDINGS: Cardiomegaly. Bilateral airspace disease, right greater than left. No visible effusions or pneumothorax. No acute bony abnormality. IMPRESSION: Bilateral airspace disease, right greater than left, favor multifocal pneumonia. Electronically Signed   By: Rolm Baptise M.D.   On: 12/14/2020 10:01   ECHOCARDIOGRAM COMPLETE  Result Date: 12/14/2020    ECHOCARDIOGRAM REPORT   Patient Name:   Simion Riedl. Date of Exam: 12/14/2020 Medical Rec #:  [id number]          Height:       69.0 in Accession #:    [id number]         Weight:       185.0 lb Date of Birth:  12/06/1955          BSA:          1.999 m Patient Age:    3 years           BP:           173/102 mmHg Patient Gender: M                  HR:           73 bpm. Exam Location:  Inpatient Procedure: 2D Echo, Cardiac Doppler and Color Doppler Indications:    CHF-Acute Diastolic  History:        Patient has no prior  history of Echocardiogram examinations.                 Signs/Symptoms:Shortness of Breath; Risk Factors:Hypertension,                 Diabetes and Dyslipidemia. CKD.  Sonographer:    Clayton Lefort RDCS (AE) Referring Phys: [id number] [employer]  1. Left ventricular ejection fraction, by estimation, is 40%. The left ventricle has mild to moderately decreased function. The left ventricle demonstrates global hypokinesis. There is mild left ventricular hypertrophy. Left ventricular diastolic parameters are consistent with Grade  II diastolic dysfunction (pseudonormalization).  2. Right ventricular systolic function is normal. The right ventricular size is normal. There is normal pulmonary artery systolic pressure. The estimated right ventricular systolic pressure is 123XX123 mmHg.  3. Left atrial size was moderately dilated.  4. The mitral valve is abnormal. Moderate mitral valve regurgitation. No evidence of mitral stenosis.  5. The aortic valve is tricuspid. Aortic valve regurgitation is not visualized. No aortic stenosis is present.  6. The inferior vena cava is normal in size with greater than 50% respiratory variability, suggesting right atrial pressure of 3 mmHg. FINDINGS  Left Ventricle: Left ventricular ejection fraction, by estimation, is 40%. The left ventricle has mild to moderately decreased function. The left ventricle demonstrates global hypokinesis. The left ventricular internal cavity size was normal in size. There is mild left ventricular hypertrophy. Left ventricular diastolic parameters are consistent with Grade II diastolic dysfunction (pseudonormalization). Right Ventricle: The right ventricular size is normal. No increase in right ventricular wall thickness. Right ventricular systolic function is normal. There is normal pulmonary artery systolic pressure. The tricuspid regurgitant velocity is 2.43 m/s, and  with an assumed right atrial pressure of 3 mmHg, the estimated right ventricular  systolic pressure is 123XX123 mmHg. Left Atrium: Left atrial size was moderately dilated. Right Atrium: Right atrial size was normal in size. Pericardium: There is no evidence of pericardial effusion. Mitral Valve: The mitral valve is abnormal. Moderate mitral valve regurgitation. No evidence of mitral valve stenosis. MV peak gradient, 4.2 mmHg. The mean mitral valve gradient is 1.0 mmHg. Tricuspid Valve: The tricuspid valve is normal in structure. Tricuspid valve regurgitation is trivial. Aortic Valve: The aortic valve is tricuspid. Aortic valve regurgitation is not visualized. No aortic stenosis is present. Aortic valve mean gradient measures 2.0 mmHg. Aortic valve peak gradient measures 3.3 mmHg. Aortic valve area, by VTI measures 2.82 cm. Pulmonic Valve: The pulmonic valve was normal in structure. Pulmonic valve regurgitation is trivial. Aorta: The aortic root is normal in size and structure. Venous: The inferior vena cava is normal in size with greater than 50% respiratory variability, suggesting right atrial pressure of 3 mmHg. IAS/Shunts: No atrial level shunt detected by color flow Doppler.  LEFT VENTRICLE PLAX 2D LVIDd:         4.00 cm      Diastology LVIDs:         3.60 cm      LV e' medial:    5.11 cm/s LV PW:         1.80 cm      LV E/e' medial:  18.4 LV IVS:        1.40 cm      LV e' lateral:   5.98 cm/s LVOT diam:     2.20 cm      LV E/e' lateral: 15.7 LV SV:         40 LV SV Index:   20 LVOT Area:     3.80 cm  LV Volumes (MOD) LV vol d, MOD A2C: 147.0 ml LV vol d, MOD A4C: 138.0 ml LV vol s, MOD A2C: 82.2 ml LV vol s, MOD A4C: 81.1 ml LV SV MOD A2C:     64.8 ml LV SV MOD A4C:     138.0 ml LV SV MOD BP:      59.3 ml RIGHT VENTRICLE             IVC RV Basal diam:  2.80 cm     IVC diam: 1.80 cm  RV S prime:     11.90 cm/s TAPSE (M-mode): 2.1 cm LEFT ATRIUM             Index       RIGHT ATRIUM           Index LA diam:        4.10 cm 2.05 cm/m  RA Area:     17.10 cm LA Vol (A2C):   85.5 ml 42.78 ml/m RA  Volume:   42.60 ml  21.31 ml/m LA Vol (A4C):   71.2 ml 35.62 ml/m LA Biplane Vol: 81.0 ml 40.53 ml/m  AORTIC VALVE AV Area (Vmax):    2.63 cm AV Area (Vmean):   2.90 cm AV Area (VTI):     2.82 cm AV Vmax:           91.40 cm/s AV Vmean:          59.800 cm/s AV VTI:            0.140 m AV Peak Grad:      3.3 mmHg AV Mean Grad:      2.0 mmHg LVOT Vmax:         63.30 cm/s LVOT Vmean:        45.600 cm/s LVOT VTI:          0.104 m LVOT/AV VTI ratio: 0.74  AORTA Ao Root diam: 3.40 cm Ao Asc diam:  3.40 cm MITRAL VALVE                 TRICUSPID VALVE MV Area (PHT): 4.29 cm      TR Peak grad:   23.6 mmHg MV Area VTI:   1.86 cm      TR Vmax:        243.00 cm/s MV Peak grad:  4.2 mmHg MV Mean grad:  1.0 mmHg      SHUNTS MV Vmax:       1.03 m/s      Systemic VTI:  0.10 m MV Vmean:      53.6 cm/s     Systemic Diam: 2.20 cm MV Decel Time: 177 msec MR Peak grad:    146.9 mmHg MR Mean grad:    96.0 mmHg MR Vmax:         606.00 cm/s MR Vmean:        462.0 cm/s MR PISA:         1.57 cm MR PISA Eff ROA: 10 mm MR PISA Radius:  0.50 cm MV E velocity: 93.80 cm/s MV A velocity: 36.00 cm/s MV E/A ratio:  2.61 Loralie Champagne MD Electronically signed by Loralie Champagne MD Signature Date/Time: 12/14/2020/5:37:39 PM    Final      Medications:   . sodium chloride     . aspirin EC  81 mg Oral Daily  . atorvastatin  40 mg Oral q1800  . carvedilol  25 mg Oral BID WC  . feeding supplement  237 mL Oral BID BM  . furosemide  80 mg Intravenous Q12H  . heparin  5,000 Units Subcutaneous Q12H  . hydrALAZINE  100 mg Oral Q8H  . influenza vac split quadrivalent PF  0.5 mL Intramuscular Tomorrow-1000  . insulin aspart  0-9 Units Subcutaneous TID WC  . ipratropium  0.5 mg Nebulization BID  . isosorbide mononitrate  60 mg Oral Daily  . levalbuterol  0.63 mg Nebulization BID  . pneumococcal 23 valent vaccine  0.5 mL Intramuscular Tomorrow-1000  . sodium chloride flush  3  mL Intravenous Q12H   sodium chloride, acetaminophen,  benzonatate, chlorpheniramine-HYDROcodone, guaiFENesin-dextromethorphan, ondansetron (ZOFRAN) IV, sodium chloride flush  Assessment/ Plan:  1.Renal-we know  that in 2019 creatinine was 1.8, and in late October 2021 creatinine was 2.6 in the setting of a very high blood pressure.  In the last 4 months there has been a change of kidney function.  Unknown if this was slow or acute.  Is difficult to identify any recent nephrotoxins.  Renal ultrasound does show fairly small kidneys this may be consistent with renal artery stenosis bilaterally.   Not sure there is much that can be done about this.   His acute kidney injury may be slowly resolving.  Needless to say he probably will be stage IV chronic kidney disease and will need education and access placement.  Not sure that this will need to be done during his hospitalization   2. Hypertension/volume  -malignant hypertension which appears to be a theme.    Appears to be making some urine.  I think Lasix may be reasonable choice and will transition to oral Lasix will need 80 mg twice daily.  Continue to avoid ACE inhibitor's and angiotensin receptor blockers 3. Anemia  -does have anemia to a certain extent, but not as low as I would expect back for chronically progressive chronic kidney disease.  No action is needed right now 4.  Hypokalemia-has received replacement.  May need more with moderate dose Lasix- will follow and replete as needed  5.  A. Fib- could be causing some variable renal perfusion-cardiology is on board   LOS: 2 Sherril Croon '@TODAY''@9'$ :30 AM

## 2020-12-16 NOTE — Progress Notes (Signed)
Inpatient Diabetes Program Recommendations  AACE/ADA: New Consensus Statement on Inpatient Glycemic Control (2015)  Target Ranges:  Prepandial:   less than 140 mg/dL      Peak postprandial:   less than 180 mg/dL (1-2 hours)      Critically ill patients:  140 - 180 mg/dL   Lab Results  Component Value Date   GLUCAP 235 (H) 12/16/2020   HGBA1C 7.0 (H) 12/15/2020    Review of Glycemic Control Results for Ruben Camacho, Ruben Camacho (MRN [id number]) as of 12/16/2020 10:59  Ref. Range 12/15/2020 07:59 12/15/2020 12:41 12/15/2020 16:46 12/15/2020 21:59 12/16/2020 07:50  Glucose-Capillary Latest Ref Range: 70 - 99 mg/dL 177 (H) 181 (H) 211 (H) 175 (H) 235 (H)   Diabetes history: DM 2 Outpatient Diabetes medications: Glipizide 2.5 mg Qd Current orders for Inpatient glycemic control:  Novolog 0-9 units tid  Inpatient Diabetes Program Recommendations:    Elevated renal function - Add Levemir 5 units  Thanks,  Tama Headings RN, MSN, BC-ADM Inpatient Diabetes Coordinator Team Pager [phone] (8a-5p)

## 2020-12-16 NOTE — Progress Notes (Signed)
Cardiology Progress Note  Patient ID: Ruben Camacho. MRN: [id number] DOB: 07-07-1956 Date of Encounter: 12/16/2020  Primary Cardiologist: Evalina Field, MD  Subjective   Chief Complaint: Weakness  HPI: Weakness reported.  No further atrial tachycardia episodes.  BPs are improved.  Creatinine coming down.  Remains euvolemic.  ROS:  All other ROS reviewed and negative. Pertinent positives noted in the HPI.     Inpatient Medications  Scheduled Meds: . aspirin EC  81 mg Oral Daily  . atorvastatin  40 mg Oral q1800  . carvedilol  25 mg Oral BID WC  . feeding supplement  237 mL Oral BID BM  . furosemide  80 mg Intravenous Q12H  . heparin  5,000 Units Subcutaneous Q12H  . hydrALAZINE  100 mg Oral Q8H  . influenza vac split quadrivalent PF  0.5 mL Intramuscular Tomorrow-1000  . insulin aspart  0-9 Units Subcutaneous TID WC  . ipratropium  0.5 mg Nebulization BID  . isosorbide mononitrate  60 mg Oral Daily  . levalbuterol  0.63 mg Nebulization BID  . pneumococcal 23 valent vaccine  0.5 mL Intramuscular Tomorrow-1000  . sodium chloride flush  3 mL Intravenous Q12H   Continuous Infusions: . sodium chloride     PRN Meds: sodium chloride, acetaminophen, benzonatate, chlorpheniramine-HYDROcodone, guaiFENesin-dextromethorphan, ondansetron (ZOFRAN) IV, sodium chloride flush   Vital Signs   Vitals:   12/15/20 2117 12/16/20 0530 12/16/20 0535 12/16/20 0800  BP: (!) 140/105  (!) 149/121 (!) 153/95  Pulse:   81 79  Resp:   20 16  Temp:   98.1 F (36.7 C) 97.7 F (36.5 C)  TempSrc:   Oral Oral  SpO2:   99% 96%  Weight:  70.4 kg    Height:        Intake/Output Summary (Last 24 hours) at 12/16/2020 0903 Last data filed at 12/16/2020 0530 Gross per 24 hour  Intake 483 ml  Output 1375 ml  Net -892 ml   Last 3 Weights 12/16/2020 12/15/2020 12/14/2020  Weight (lbs) 155 lb 4.8 oz 161 lb 14.4 oz 160 lb 12.8 oz  Weight (kg) 70.444 kg 73.437 kg 72.938 kg      Telemetry   Overnight telemetry shows sinus rhythm with heart rate in the 80s, which I personally reviewed.   ECG  The most recent ECG shows atrial tachycardia heart rate 142, which I personally reviewed.   Physical Exam   Vitals:   12/15/20 2117 12/16/20 0530 12/16/20 0535 12/16/20 0800  BP: (!) 140/105  (!) 149/121 (!) 153/95  Pulse:   81 79  Resp:   20 16  Temp:   98.1 F (36.7 C) 97.7 F (36.5 C)  TempSrc:   Oral Oral  SpO2:   99% 96%  Weight:  70.4 kg    Height:         Intake/Output Summary (Last 24 hours) at 12/16/2020 0903 Last data filed at 12/16/2020 0530 Gross per 24 hour  Intake 483 ml  Output 1375 ml  Net -892 ml    Last 3 Weights 12/16/2020 12/15/2020 12/14/2020  Weight (lbs) 155 lb 4.8 oz 161 lb 14.4 oz 160 lb 12.8 oz  Weight (kg) 70.444 kg 73.437 kg 72.938 kg    Body mass index is 22.93 kg/m.  General: Well nourished, well developed, in no acute distress Head: Atraumatic, normal size  Eyes: PEERLA, EOMI  Neck: Supple, JVD 5 to 7 cm of water Endocrine: No thryomegaly Cardiac: Normal S1, S2; RRR; no murmurs, rubs, or  gallops Lungs: Clear to auscultation bilaterally, no wheezing, rhonchi or rales  Abd: Soft, nontender, no hepatomegaly  Ext: No edema, pulses 2+ Musculoskeletal: No deformities, BUE and BLE strength normal and equal Skin: Warm and dry, no rashes   Neuro: Alert and oriented to person, place, time, and situation, CNII-XII grossly intact, no focal deficits  Psych: Normal mood and affect   Labs  High Sensitivity Troponin:   Recent Labs  Lab 12/14/20 0952 12/14/20 1139 12/15/20 1431 12/15/20 1639  TROPONINIHS 166* 155* 104* 104*     Cardiac EnzymesNo results for input(s): TROPONINI in the last 168 hours. No results for input(s): TROPIPOC in the last 168 hours.  Chemistry Recent Labs  Lab 12/14/20 0952 12/15/20 0147 12/16/20 0138  NA 133* 135 135  K 3.1* 3.4* 4.0  CL 93* 96* 97*  CO2 '24 25 23  '$ GLUCOSE 262* 230* 195*  BUN 96* 101* 101*   CREATININE 5.03* 5.03* 4.72*  CALCIUM 8.4* 8.1* 8.6*  PROT 6.5  --  6.2*  ALBUMIN 3.1*  --  2.9*  AST 30  --  22  ALT 49*  --  36  ALKPHOS 84  --  75  BILITOT 0.9  --  1.0  GFRNONAA 12* 12* 13*  ANIONGAP 16* 14 15    Hematology Recent Labs  Lab 12/14/20 0952 12/15/20 0901 12/16/20 0138  WBC 9.2 9.8 8.9  RBC 4.99 4.70 4.96  HGB 11.7* 11.0* 11.5*  HCT 38.1* 36.1* 38.4*  MCV 76.4* 76.8* 77.4*  MCH 23.4* 23.4* 23.2*  MCHC 30.7 30.5 29.9*  RDW 16.9* 16.8* 17.1*  PLT 200 174 192   BNP Recent Labs  Lab 12/14/20 0952  BNP 3,171.0*    DDimer  Recent Labs  Lab 12/14/20 0942 12/15/20 0147 12/15/20 0901  DDIMER 4.41* 3.15* 2.73*     Radiology  DG Chest 1 View  Result Date: 12/15/2020 CLINICAL DATA:  Congestive heart failure. EXAM: CHEST  1 VIEW COMPARISON:  12/14/2020. FINDINGS: Mediastinum hilar structures normal. Cardiomegaly with pulmonary venous congestion again noted. Bilateral pulmonary infiltrates/edema right side greater than left again noted without interim change. No pleural effusion or pneumothorax. IMPRESSION: 1. Cardiomegaly with pulmonary venous congestion again noted. 2. Bilateral pulmonary infiltrates/edema right side greater than left again noted. Electronically Signed   By: Marcello Moores  Register   On: 12/15/2020 07:57   US RENAL  Result Date: 12/15/2020 CLINICAL DATA:  Acute kidney injury. EXAM: RENAL / URINARY TRACT ULTRASOUND COMPLETE COMPARISON:  None. FINDINGS: Right Kidney: Renal measurements: 8.4 x 5.0 x 4.2 cm = volume: 94 mL. Increased renal parenchymal echogenicity. No mass or hydronephrosis visualized. No visualized renal calculi. Left Kidney: Renal measurements: 8.3 x 4.4 x 3.7 cm = volume: 7 mL. Increased renal parenchymal echogenicity. There is some cortical scarring in the lateral kidney. No mass or hydronephrosis visualized. No visualized renal calculi. Bladder: Appears normal for degree of bladder distention. Other: None. IMPRESSION: 1. Increased  bilateral renal echogenicity consistent with chronic medical renal disease. 2. No hydronephrosis. Electronically Signed   By: Keith Rake M.D.   On: 12/15/2020 21:30   DG CHEST PORT 1 VIEW  Result Date: 12/16/2020 CLINICAL DATA:  Shortness of breath EXAM: PORTABLE CHEST 1 VIEW COMPARISON:  12/15/2020 FINDINGS: Cardiomegaly. Airspace disease throughout the right lung and in the left lung base are unchanged since prior study. No visible effusions or pneumothorax. No acute bony abnormality. IMPRESSION: Bilateral airspace disease, right greater than left again noted, unchanged. Electronically Signed   By: Lennette Bihari  [place] M.D.   On: 12/16/2020 06:59   DG Chest Port 1 View  Result Date: 12/14/2020 CLINICAL DATA:  Shortness of breath, cough, hypertensive EXAM: PORTABLE CHEST 1 VIEW COMPARISON:  12/16/2015 FINDINGS: Cardiomegaly. Bilateral airspace disease, right greater than left. No visible effusions or pneumothorax. No acute bony abnormality. IMPRESSION: Bilateral airspace disease, right greater than left, favor multifocal pneumonia. Electronically Signed   By: Rolm Baptise M.D.   On: 12/14/2020 10:01   ECHOCARDIOGRAM COMPLETE  Result Date: 12/14/2020    ECHOCARDIOGRAM REPORT   Patient Name:   Ruben Camacho. Date of Exam: 12/14/2020 Medical Rec #:  [id number]          Height:       69.0 in Accession #:    [id number]         Weight:       185.0 lb Date of Birth:  1955/12/18          BSA:          1.999 m Patient Age:    65 years           BP:           173/102 mmHg Patient Gender: M                  HR:           73 bpm. Exam Location:  Inpatient Procedure: 2D Echo, Cardiac Doppler and Color Doppler Indications:    CHF-Acute Diastolic  History:        Patient has no prior history of Echocardiogram examinations.                 Signs/Symptoms:Shortness of Breath; Risk Factors:Hypertension,                 Diabetes and Dyslipidemia. CKD.  Sonographer:    Clayton Lefort RDCS (AE) Referring Phys: [id number] [place]  1. Left ventricular ejection fraction, by estimation, is 40%. The left ventricle has mild to moderately decreased function. The left ventricle demonstrates global hypokinesis. There is mild left ventricular hypertrophy. Left ventricular diastolic parameters are consistent with Grade II diastolic dysfunction (pseudonormalization).  2. Right ventricular systolic function is normal. The right ventricular size is normal. There is normal pulmonary artery systolic pressure. The estimated right ventricular systolic pressure is 123XX123 mmHg.  3. Left atrial size was moderately dilated.  4. The mitral valve is abnormal. Moderate mitral valve regurgitation. No evidence of mitral stenosis.  5. The aortic valve is tricuspid. Aortic valve regurgitation is not visualized. No aortic stenosis is present.  6. The inferior vena cava is normal in size with greater than 50% respiratory variability, suggesting right atrial pressure of 3 mmHg. FINDINGS  Left Ventricle: Left ventricular ejection fraction, by estimation, is 40%. The left ventricle has mild to moderately decreased function. The left ventricle demonstrates global hypokinesis. The left ventricular internal cavity size was normal in size. There is mild left ventricular hypertrophy. Left ventricular diastolic parameters are consistent with Grade II diastolic dysfunction (pseudonormalization). Right Ventricle: The right ventricular size is normal. No increase in right ventricular wall thickness. Right ventricular systolic function is normal. There is normal pulmonary artery systolic pressure. The tricuspid regurgitant velocity is 2.43 m/s, and  with an assumed right atrial pressure of 3 mmHg, the estimated right ventricular systolic pressure is 123XX123 mmHg. Left Atrium: Left atrial size was moderately dilated. Right Atrium: Right atrial size was normal in size. Pericardium: There is no  evidence of pericardial effusion. Mitral Valve: The mitral valve is abnormal.  Moderate mitral valve regurgitation. No evidence of mitral valve stenosis. MV peak gradient, 4.2 mmHg. The mean mitral valve gradient is 1.0 mmHg. Tricuspid Valve: The tricuspid valve is normal in structure. Tricuspid valve regurgitation is trivial. Aortic Valve: The aortic valve is tricuspid. Aortic valve regurgitation is not visualized. No aortic stenosis is present. Aortic valve mean gradient measures 2.0 mmHg. Aortic valve peak gradient measures 3.3 mmHg. Aortic valve area, by VTI measures 2.82 cm. Pulmonic Valve: The pulmonic valve was normal in structure. Pulmonic valve regurgitation is trivial. Aorta: The aortic root is normal in size and structure. Venous: The inferior vena cava is normal in size with greater than 50% respiratory variability, suggesting right atrial pressure of 3 mmHg. IAS/Shunts: No atrial level shunt detected by color flow Doppler.  LEFT VENTRICLE PLAX 2D LVIDd:         4.00 cm      Diastology LVIDs:         3.60 cm      LV e' medial:    5.11 cm/s LV PW:         1.80 cm      LV E/e' medial:  18.4 LV IVS:        1.40 cm      LV e' lateral:   5.98 cm/s LVOT diam:     2.20 cm      LV E/e' lateral: 15.7 LV SV:         40 LV SV Index:   20 LVOT Area:     3.80 cm  LV Volumes (MOD) LV vol d, MOD A2C: 147.0 ml LV vol d, MOD A4C: 138.0 ml LV vol s, MOD A2C: 82.2 ml LV vol s, MOD A4C: 81.1 ml LV SV MOD A2C:     64.8 ml LV SV MOD A4C:     138.0 ml LV SV MOD BP:      59.3 ml RIGHT VENTRICLE             IVC RV Basal diam:  2.80 cm     IVC diam: 1.80 cm RV S prime:     11.90 cm/s TAPSE (M-mode): 2.1 cm LEFT ATRIUM             Index       RIGHT ATRIUM           Index LA diam:        4.10 cm 2.05 cm/m  RA Area:     17.10 cm LA Vol (A2C):   85.5 ml 42.78 ml/m RA Volume:   42.60 ml  21.31 ml/m LA Vol (A4C):   71.2 ml 35.62 ml/m LA Biplane Vol: 81.0 ml 40.53 ml/m  AORTIC VALVE AV Area (Vmax):    2.63 cm AV Area (Vmean):   2.90 cm AV Area (VTI):     2.82 cm AV Vmax:           91.40 cm/s AV Vmean:           59.800 cm/s AV VTI:            0.140 m AV Peak Grad:      3.3 mmHg AV Mean Grad:      2.0 mmHg LVOT Vmax:         63.30 cm/s LVOT Vmean:        45.600 cm/s LVOT VTI:          0.104 m LVOT/AV  VTI ratio: 0.74  AORTA Ao Root diam: 3.40 cm Ao Asc diam:  3.40 cm MITRAL VALVE                 TRICUSPID VALVE MV Area (PHT): 4.29 cm      TR Peak grad:   23.6 mmHg MV Area VTI:   1.86 cm      TR Vmax:        243.00 cm/s MV Peak grad:  4.2 mmHg MV Mean grad:  1.0 mmHg      SHUNTS MV Vmax:       1.03 m/s      Systemic VTI:  0.10 m MV Vmean:      53.6 cm/s     Systemic Diam: 2.20 cm MV Decel Time: 177 msec MR Peak grad:    146.9 mmHg MR Mean grad:    96.0 mmHg MR Vmax:         606.00 cm/s MR Vmean:        462.0 cm/s MR PISA:         1.57 cm MR PISA Eff ROA: 10 mm MR PISA Radius:  0.50 cm MV E velocity: 93.80 cm/s MV A velocity: 36.00 cm/s MV E/A ratio:  2.61 Loralie Champagne MD Electronically signed by Loralie Champagne MD Signature Date/Time: 12/14/2020/5:37:39 PM    Final     Cardiac Studies  TTE 12/14/2020 1. Left ventricular ejection fraction, by estimation, is 40%. The left  ventricle has mild to moderately decreased function. The left ventricle  demonstrates global hypokinesis. There is mild left ventricular  hypertrophy. Left ventricular diastolic  parameters are consistent with Grade II diastolic dysfunction  (pseudonormalization).  2. Right ventricular systolic function is normal. The right ventricular  size is normal. There is normal pulmonary artery systolic pressure. The  estimated right ventricular systolic pressure is 123XX123 mmHg.  3. Left atrial size was moderately dilated.  4. The mitral valve is abnormal. Moderate mitral valve regurgitation. No  evidence of mitral stenosis.  5. The aortic valve is tricuspid. Aortic valve regurgitation is not  visualized. No aortic stenosis is present.  6. The inferior vena cava is normal in size with greater than 50%  respiratory variability, suggesting  right atrial pressure of 3 mmHg.   Patient Profile  Ruben Camacho. is a 65 y.o. male with CKD, hypertension, type 2 diabetes, stroke who was admitted on 12/14/2020 with shortness of breath and worsening kidney function.  Found to have new onset systolic heart failure in the setting of hypertensive crisis.  Assessment & Plan   1.  Hypertensive crisis/systolic heart failure, EF 40-45% with global hypokinesis -Euvolemic on examination.  Significant creatinine elevation up to 5 with significant BUN over 100.  Improved with diuresis.  In my opinion he is euvolemic.  His BNP was elevated in setting of severely elevated blood pressures around [id number] systolically. -His EF is roughly around 45%.  This is mildly reduced and not surprising in setting of hypertensive crisis.  Would recommend to continue Coreg, hydralazine and Imdur as he is on. -No ACE/ARB/Arni/MRA given significant kidney disease.  Nephrology is following. -I would not recommend an ischemic evaluation given his kidney function.  He can be evaluated in the outpatient setting for this. -No SGLT2 inhibitor given significant kidney disease.  2.  Atrial tachycardia -Telemetry shows A. tach episodes.  They have improved with Coreg.  No A. fib on my review.  This is not contributing to his kidney disease. -Would recommend to  continue Coreg 25 twice a day.  He is having no further episodes of atrial tachycardia on the beta-blocker.  I suspect this will improve with treatment of his hypertension as well.  3.  Non-MI troponin elevation in setting of hypertensive crisis -EKG with LVH repolarization normality.  Troponins are minimally elevated and flat in the setting of CKD.  No chest pain symptoms.  He reports weakness.  EF demonstrates global hypokinesis in the setting of hypertensive crisis.  This is not an MI. -Continue aspirin and statin given his diabetes.  [facility] will sign off.   Medication Recommendations: Coreg 25 twice a day,  hydralazine 100 mg 3 times daily, Imdur 60 mg daily (titrate up), diuresis per nephrology Other recommendations (labs, testing, etc): None Follow up as an outpatient: Please notify us once he is closer to discharge and we will arrange outpatient follow-up in my office.  For questions or updates, please contact [place] Please consult [email or web address] for contact info under   Time Spent with Patient: I have spent a total of 25 minutes with patient reviewing hospital notes, telemetry, EKGs, labs and examining the patient as well as establishing an assessment and plan that was discussed with the patient.  > 50% of time was spent in direct patient care.    Signed, Addison Naegeli. Audie Box, [place]  12/16/2020 9:03 AM

## 2020-12-17 ENCOUNTER — Inpatient Hospital Stay (HOSPITAL_COMMUNITY): Payer: 59

## 2020-12-17 DIAGNOSIS — I5021 Acute systolic (congestive) heart failure: Secondary | ICD-10-CM | POA: Diagnosis not present

## 2020-12-17 DIAGNOSIS — N186 End stage renal disease: Secondary | ICD-10-CM

## 2020-12-17 DIAGNOSIS — I471 Supraventricular tachycardia: Secondary | ICD-10-CM | POA: Diagnosis not present

## 2020-12-17 DIAGNOSIS — N184 Chronic kidney disease, stage 4 (severe): Secondary | ICD-10-CM

## 2020-12-17 LAB — BASIC METABOLIC PANEL
Anion gap: 18 — ABNORMAL HIGH (ref 5–15)
BUN: 110 mg/dL — ABNORMAL HIGH (ref 8–23)
CO2: 24 mmol/L (ref 22–32)
Calcium: 8.6 mg/dL — ABNORMAL LOW (ref 8.9–10.3)
Chloride: 89 mmol/L — ABNORMAL LOW (ref 98–111)
Creatinine, Ser: 4.98 mg/dL — ABNORMAL HIGH (ref 0.61–1.24)
GFR, Estimated: 12 mL/min — ABNORMAL LOW (ref >60)
Glucose, Bld: 169 mg/dL — ABNORMAL HIGH (ref 70–99)
Potassium: 3.8 mmol/L (ref 3.5–5.1)
Sodium: 131 mmol/L — ABNORMAL LOW (ref 135–145)

## 2020-12-17 LAB — GLUCOSE, CAPILLARY
Glucose-Capillary: 149 mg/dL — ABNORMAL HIGH (ref 70–99)
Glucose-Capillary: 189 mg/dL — ABNORMAL HIGH (ref 70–99)
Glucose-Capillary: 192 mg/dL — ABNORMAL HIGH (ref 70–99)
Glucose-Capillary: 172 mg/dL — ABNORMAL HIGH (ref 70–99)

## 2020-12-17 MED ORDER — LORAZEPAM 0.5 MG PO TABS
0.5000 mg | ORAL_TABLET | Freq: Four times a day (QID) | ORAL | Status: DC | PRN
  Administered 2020-12-17: 0.5 mg via ORAL
  Filled 2020-12-17: qty 1

## 2020-12-17 MED ORDER — CEFAZOLIN SODIUM-DEXTROSE 1-4 GM/50ML-% IV SOLN: 1.0000 g | INTRAVENOUS | Status: DC

## 2020-12-17 MED ORDER — LABETALOL HCL 200 MG PO TABS
200.0000 mg | ORAL_TABLET | Freq: Two times a day (BID) | ORAL | Status: DC
  Administered 2020-12-17 – 2021-01-06 (×37): 200 mg via ORAL
  Filled 2020-12-17 (×39): qty 1

## 2020-12-17 MED ORDER — MORPHINE SULFATE (PF) 4 MG/ML IV SOLN
4.0000 mg | Freq: Once | INTRAVENOUS | Status: DC
  Filled 2020-12-17: qty 1

## 2020-12-17 MED ORDER — CITALOPRAM HYDROBROMIDE 20 MG PO TABS
10.0000 mg | ORAL_TABLET | Freq: Every day | ORAL | Status: DC
  Administered 2020-12-17 – 2021-01-08 (×22): 10 mg via ORAL
  Filled 2020-12-17 (×23): qty 1

## 2020-12-17 MED ORDER — MELATONIN 5 MG PO TABS
5.0000 mg | ORAL_TABLET | Freq: Every day | ORAL | Status: DC
  Administered 2020-12-17 – 2021-01-08 (×23): 5 mg via ORAL
  Filled 2020-12-17 (×24): qty 1

## 2020-12-17 MED ORDER — MINOXIDIL 2.5 MG PO TABS
2.5000 mg | ORAL_TABLET | Freq: Every day | ORAL | Status: DC
  Administered 2020-12-17 – 2020-12-22 (×5): 2.5 mg via ORAL
  Filled 2020-12-17 (×6): qty 1

## 2020-12-17 MED ORDER — DIAZEPAM 2 MG PO TABS
2.0000 mg | ORAL_TABLET | Freq: Four times a day (QID) | ORAL | Status: DC | PRN
  Administered 2020-12-17: 2 mg via ORAL
  Filled 2020-12-17: qty 1

## 2020-12-17 MED ORDER — LEVALBUTEROL HCL 0.63 MG/3ML IN NEBU
0.6300 mg | INHALATION_SOLUTION | Freq: Three times a day (TID) | RESPIRATORY_TRACT | Status: DC | PRN
  Administered 2020-12-24: 0.63 mg via RESPIRATORY_TRACT
  Filled 2020-12-17 (×2): qty 3

## 2020-12-17 MED ORDER — CEFAZOLIN SODIUM-DEXTROSE 2-4 GM/100ML-% IV SOLN
2.0000 g | INTRAVENOUS | Status: AC
  Filled 2020-12-17: qty 100

## 2020-12-17 NOTE — Progress Notes (Signed)
VASCULAR LAB    Upper extremity vein mapping has been performed.  See CV proc for preliminary results.   Zaedyn Covin, RVT 12/17/2020, 2:22 PM

## 2020-12-17 NOTE — Progress Notes (Signed)
PROGRESS NOTE  Ruben Camacho. TO:[id number] DOB: [birth year] DOA: 12/14/2020 PCP: Ruben Pollen, MD  Brief History   Ruben Camachois a 65 y.o.malewith medical history significant ofHTN, CKD stage IIIB, IIDM,HLD, presented with increasing shortness of breath.  Symptoms started about 10 to 14 days ago, gradually gotten worse, also has had dry cough and then became more productive in the last 2 to 3 days with whitish foamy sputum, no chest pain no fever chills. Overnight unable to lie down, and unable to catch breath even at rest this morning. Then called EMS.  At baseline, check blood pressure every now and then at home and found most occasions SBP>1 70-1 80, no recent BP medication adjustment. Patient reported he has been compliant with his BP meds. EMS arrived, found patient blood pressure more than 200, glucose 320, O2 saturation 88% on room air then stabilized on 3 L via nasal cannula. ED Course:Pulmonary edema, blood pressure 250,Cardene drip started and BP dropped to 140s in 3 hours. Blood work showed AKI on CKD creatinine 5.0 compared to 2.6 baseline, K3.1, glucose 262. Troponin 166. EKG LVH no acute ST-T changes. Chest x-ray pulmonary edema.  Continues to be significantly dyspneic and cough.  Still appears somewhat volume overloaded.  Very dyspneic to speak.  We will increase her diuresis and consult cardiology.  Lasix 80 mg bid started. Patient still hypertensive today.  Nephrology has been consulted. They have consulted vascular surgery. Pt will be evaluated for permanent vascular access today. Vein mapping today.  Consultants  . Nephrology . Cardiology  Procedures  . None  Antibiotics   Anti-infectives (From admission, onward)   Start     Dose/Rate Route Frequency Ordered Stop   12/18/20 0000  ceFAZolin (ANCEF) IVPB 1 g/50 mL premix  Status:  Discontinued       Note to Pharmacy: Send with pt to OR   1 g 100 mL/hr over 30 Minutes Intravenous On  call 12/17/20 1511 12/17/20 1513   12/18/20 0000  ceFAZolin (ANCEF) IVPB 2g/100 mL premix       Note to Pharmacy: Send with pt to OR   2 g 200 mL/hr over 30 Minutes Intravenous On call 12/17/20 1513 12/19/20 0000     Interval History/Subjective  The patient is resting in bed. He is complaining of depression, and seems flat and lethargic. However, the patient complains of anxiety.  Objective   Vitals:  Vitals:   12/17/20 1226 12/17/20 1435  BP: (!) 114/92 (!) 116/100  Pulse:    Resp:    Temp:    SpO2:     Exam:  Constitutional:  . The patient is awake, alert, and oriented x 3. No acute distress. He is lethargic.  Respiratory:  . No increased work of breathing. . No wheezes, rales, or rhonchi . No tactile fremitus Cardiovascular:  . Regular rate and rhythm . No murmurs, ectopy, or gallups. . No lateral PMI. No thrills. Abdomen:  . Abdomen is soft, non-tender, non-distended . No hernias, masses, or organomegaly . Normoactive bowel sounds.  Musculoskeletal:  . No cyanosis, clubbing, or edema Skin:  . No rashes, lesions, ulcers . palpation of skin: no induration or nodules Neurologic:  . CN 2-12 intact . Sensation all 4 extremities intact Psychiatric:  . Mental status o Mood, affect appropriate o Orientation to person, place, time  . judgment and insight appear intact  I have personally reviewed the following:   Today's Data  . Vitals, BMP  Imaging  . CXR  Cardiology Data  . EKG . Echocardiogram  Scheduled Meds: . aspirin EC  81 mg Oral Daily  . atorvastatin  40 mg Oral q1800  . feeding supplement  237 mL Oral BID BM  . furosemide  80 mg Oral BID  . heparin  5,000 Units Subcutaneous Q12H  . hydrALAZINE  100 mg Oral Q8H  . influenza vac split quadrivalent PF  0.5 mL Intramuscular Tomorrow-1000  . insulin aspart  0-9 Units Subcutaneous TID WC  . isosorbide mononitrate  60 mg Oral Daily  . labetalol  200 mg Oral BID  . melatonin  5 mg Oral QHS  .  minoxidil  2.5 mg Oral Daily  . pneumococcal 23 valent vaccine  0.5 mL Intramuscular Tomorrow-1000  . sodium chloride flush  3 mL Intravenous Q12H   Continuous Infusions: . sodium chloride    . [START ON 12/18/2020]  ceFAZolin (ANCEF) IV     Active Problems:   CHF (congestive heart failure) (HCC)   Protein-calorie malnutrition, severe   LOS: 3 days   A & P  Acute on Chronic Combined Sysotlic and Diastolic CHF Decompensation secondary to Hypertensive Emergency  -Discussed with ED physician, appeared thatCardenedrip droppedBP too fast, will discontinue Cardene drip. -BNP on Admission was 3,171.0  -Troponin 166 -> 155 -Continue to monitor BP per Protocol  -Continue Amlodipine 10 mg po Daily, hold his ARB and HCTZ and nifedipine. -Started Hydralazine 10 mg po q8h but will titrate up to 25 mg TID and Imdur 30 mg po Daily  -Lasix 40 mg x 1 yesterday and renewed at 40 mg IV q12h -Strict I's and O's and Daily Weights; Patient is -352 mL -Repeat chest x-ray this AM showed "Cardiomegaly with pulmonary venous congestion again noted. Bilateral pulmonary infiltrates/edema right side greater than left again noted." -Continue to Monitor for S/Sx of Volume Overload Patient remains hypertensive.  Acute Respiratory Failure with Hypoxia from Above The patient was saturating at 95-100% on room air for most of the day. In the last hour the patient has had decreased his SaO2 to 84%. CXR is unchanged. Given DuoNeb x1; Will add Scheduled Xopenex/Atrovent, Flutter Valve and Incentive Spirometry -Continue Supplemental O2 via [place] and Wean O2 as tolerated -Continuous Pulse Oximetry and Maintain O2 Saturations >90% -Will need an Ambulatory Home O2 Screen prior to D/C and repeat CXR in the AM  -Stated Antitussives for Cough   Hypertension Emergency Hypertension is better controlled. -C/w Amlodipine 10 mg po Daily, Increase Hydralazine 10 mg po q8h -> 25 mg po TID, Hold ARB HCTZ and Nifedipine -C/w Imdur  30 mg po Daily  -C/w Lasix Diuresis  -Continue to Monitor BP per Protocol  Most recent BP was 138/82.  Elevated Troponins -Likely demand ischemia from HTN emergency and CHF decompensation as well as AKI. -No significant ST changes on EKG,  -Troponin I went from 166 -> 155 -Will trend troponins and ordered echocardiogram,  -No chest pain,, will not start ACS meds. -Continue aspirin and statin. -Will Consult Cardiology   AKI onCKD stage IIIb Hyperphosphatemia -In the Setting Fluid overload -Patient's BUN/Cr is now gone from 96/5.03 -> 101/5.03> 101/4.72 -Avoid Nephrotoxic Medications, Contrast Dyes, Hypotension and Renally adjust medications  -Diuresis with caution and repeat chest x-ray  -Continue to Monitor and Trend Renal Fxn -Nephrology consulted.  -Vascular surgery evaluating patient for permanent HD access today -Vein mapping today. -Repeat BMP in the AM   Anxiety/Depression: Will start celexa. Will also make low dose ativan available to the patient on  an as needed basis.  Elevated D-Dimer -Trending down; D-Dimer went from 4.41 -> 3.15 -> 2.73 -No significant PE risk, likely reflect acute cardio-pulmonary changes -Repeat one reading in AM to determine further image study.  Hypokalemia: Resolved.  -K+ was 3.1 and improved to 3.8 -Mag Level was 2.2 -Replete with po KCl 40 mEQ x1 -Continue to Monitor and Replete as Necessary -Repeat CMP in AM   Microcytic Anemia  -Patient's Hgb/Hct went from 11.7/38.1 -> 11.0/36.1 -Check Anemia Panel in the AM -Continue to Monitor for S/Sx of Bleeding; Currently no overt bleeding noted -Repeat CBC in the AM   IIDMwith Hyperglycemia -Hold oral medications -Started SSI with Sensitive Novolog AC -Check HbA1c in the AM -CBGs ranging from 177-205   DVT prophylaxis: Heparin 5,000 units sq q8h given Renal FXn  Code Status: FULL CODE  Family Communication: No family present at bedside  Disposition Plan: Pending further  clinical improvement and clearance by Cardiology   Status is: Inpatient  Remains inpatient appropriate because:Unsafe d/c plan, IV treatments appropriate due to intensity of illness or inability to take PO and Inpatient level of care appropriate due to severity of illness   Dispo: The patient is from: Home  Anticipated d/c is to: TBD  Anticipated d/c date is: 2 days  Patient currently is not medically stable to d/c.              Difficult to place patient No     Ruben Amadon, DO [facility] Direct contact: see [email or web address]  7PM-7AM contact night coverage as above 12/17/2020, 5:02 PM  LOS: 2 days

## 2020-12-17 NOTE — Progress Notes (Signed)
[facility] ROUNDING NOTE   Subjective:   Interval History: This is a 65 year old gentleman with history of diabetes hypertension which has been malignant times, hyperlipidemia history of stroke, gout stage IV chronic kidney disease with a baseline serum creatinine by 2.05 August 2020  He presented with dyspnea and lower extremity swelling.  He was also found to have a blood pressure was elevated to [id number] systolic.  His blood sugar was 320 and creatinine elevated at 5 mg/dL with chest x-ray which was consistent with pulmonary edema.  There is reported history of nonsteroidal anti-inflammatory drug use.  Medications do include Hyzaar 100/25 mg daily  Urinalysis showed no white blood cells no red blood cells.  There was greater than 300 mg of proteinuria noted on 08/25/2020 however only 100 mg/dL noted on 12/15/2020 Renal ultrasound showed increased bilateral renal echogenicity no hydronephrosis.  Kidney size appears to be small bilaterally.  Blood pressure 161/98 pulse 74 temperature 98.3 O2 sats 96% 2 L  Sodium 131 potassium 3.8 chloride 89 CO2 24 BUN 110 creatinine 4.98 glucose 169 calcium 8.6 albumin 2.9 phosphorus 5.3 calcium 8.6 hemoglobin 11.5  Urine output 2.45 L 12/16/2020     Objective:  Vital signs in last 24 hours:  Temp:  [97.6 F (36.4 C)-98.3 F (36.8 C)] 98.3 F (36.8 C) (02/17 0327) Pulse Rate:  [64-74] 67 (02/17 0939) Resp:  [18] 18 (02/17 0327) BP: (122-175)/(74-98) 161/98 (02/17 0939) SpO2:  [93 %-95 %] 94 % (02/17 0327) Weight:  [70.9 kg] 70.9 kg (02/17 0327)  Weight change: 0.408 kg Filed Weights   12/15/20 0635 12/16/20 0530 12/17/20 0327  Weight: 73.4 kg 70.4 kg 70.9 kg    Intake/Output: I/O last 3 completed shifts: In: 1980 [P.O.:1977; I.V.:3] Out: 3300 [Urine:3300]   Intake/Output this shift:  Total I/O In: -  Out: 375 [Urine:375]  General: Thin black male, mild distress secondary to not feeling well, tremulous HEENT: Pupils are  equal round and reactive to light, extraocular motions are intact, mucous membranes are moist Neck: No obvious JVD Heart: Right now irregularly irregular Lungs: Decreased breath sounds at the bases, deep breathing induces coughing Abdomen: Soft, nontender, nondistended Extremities: Some pitting edema to dependent areas, no peripheral edema Skin: Warm and dry Neuro: Some difficulty with speech, wonder if residual from CVA or just because he is feeling    Basic Metabolic Panel: Recent Labs  Lab 12/14/20 0952 12/15/20 0147 12/15/20 0901 12/16/20 0138 12/17/20 0157  NA 133* 135  --  135 131*  K 3.1* 3.4*  --  4.0 3.8  CL 93* 96*  --  97* 89*  CO2 24 25  --  23 24  GLUCOSE 262* 230*  --  195* 169*  BUN 96* 101*  --  101* 110*  CREATININE 5.03* 5.03*  --  4.72* 4.98*  CALCIUM 8.4* 8.1*  --  8.6* 8.6*  MG  --  2.4 2.2 2.3  --   PHOS  --   --  5.9* 5.3*  --     Liver Function Tests: Recent Labs  Lab 12/14/20 0952 12/16/20 0138  AST 30 22  ALT 49* 36  ALKPHOS 84 75  BILITOT 0.9 1.0  PROT 6.5 6.2*  ALBUMIN 3.1* 2.9*   No results for input(s): LIPASE, AMYLASE in the last 168 hours. No results for input(s): AMMONIA in the last 168 hours.  CBC: Recent Labs  Lab 12/14/20 0952 12/15/20 0901 12/16/20 0138  WBC 9.2 9.8 8.9  NEUTROABS  --  8.3* 7.3  HGB 11.7* 11.0* 11.5*  HCT 38.1* 36.1* 38.4*  MCV 76.4* 76.8* 77.4*  PLT 200 174 192    Cardiac Enzymes: No results for input(s): CKTOTAL, CKMB, CKMBINDEX, TROPONINI in the last 168 hours.  BNP: Invalid input(s): POCBNP  CBG: Recent Labs  Lab 12/16/20 0750 12/16/20 1144 12/16/20 1546 12/16/20 2135 12/17/20 0831  GLUCAP 235* 305* 126* 180* 149*    Microbiology: Results for orders placed or performed during the hospital encounter of 12/14/20  Resp Panel by RT-PCR (Flu A&B, Covid) Nasopharyngeal Swab     Status: None   Collection Time: 12/14/20 11:00 AM   Specimen: Nasopharyngeal Swab; Nasopharyngeal(NP) swabs in  vial transport medium  Result Value Ref Range Status   SARS Coronavirus 2 by RT PCR NEGATIVE NEGATIVE Final    Comment: (NOTE) SARS-CoV-2 target nucleic acids are NOT DETECTED.  The SARS-CoV-2 RNA is generally detectable in upper respiratory specimens during the acute phase of infection. The lowest concentration of SARS-CoV-2 viral copies this assay can detect is 138 copies/mL. A negative result does not preclude SARS-Cov-2 infection and should not be used as the sole basis for treatment or other patient management decisions. A negative result may occur with  improper specimen collection/handling, submission of specimen other than nasopharyngeal swab, presence of viral mutation(s) within the areas targeted by this assay, and inadequate number of viral copies(<138 copies/mL). A negative result must be combined with clinical observations, patient history, and epidemiological information. The expected result is Negative.  Fact Sheet for Patients:  EntrepreneurPulse.com.au  Fact Sheet for Healthcare Providers:  IncredibleEmployment.be  This test is no t yet approved or cleared by the [place] FDA and  has been authorized for detection and/or diagnosis of SARS-CoV-2 by FDA under an Emergency Use Authorization (EUA). This EUA will remain  in effect (meaning this test can be used) for the duration of the COVID-19 declaration under Section 564(b)(1) of the Act, 21 U.S.C.section 360bbb-3(b)(1), unless the authorization is terminated  or revoked sooner.       Influenza A by PCR NEGATIVE NEGATIVE Final   Influenza B by PCR NEGATIVE NEGATIVE Final    Comment: (NOTE) The Xpert Xpress SARS-CoV-2/FLU/RSV plus assay is intended as an aid in the diagnosis of influenza from Nasopharyngeal swab specimens and should not be used as a sole basis for treatment. Nasal washings and aspirates are unacceptable for Xpert Xpress SARS-CoV-2/FLU/RSV testing.  Fact  Sheet for Patients: EntrepreneurPulse.com.au  Fact Sheet for Healthcare Providers: IncredibleEmployment.be  This test is not yet approved or cleared by the [place] FDA and has been authorized for detection and/or diagnosis of SARS-CoV-2 by FDA under an Emergency Use Authorization (EUA). This EUA will remain in effect (meaning this test can be used) for the duration of the COVID-19 declaration under Section 564(b)(1) of the Act, 21 U.S.C. section 360bbb-3(b)(1), unless the authorization is terminated or revoked.  Performed at [facility], [place] [address], [place], [place] [address]     Coagulation Studies: No results for input(s): LABPROT, INR in the last 72 hours.  Urinalysis: Recent Labs    12/15/20 2334  COLORURINE STRAW*  LABSPEC 1.009  PHURINE 6.0  GLUCOSEU 50*  HGBUR NEGATIVE  BILIRUBINUR NEGATIVE  KETONESUR NEGATIVE  PROTEINUR 100*  NITRITE NEGATIVE  LEUKOCYTESUR NEGATIVE      Imaging: US RENAL  Result Date: 12/15/2020 CLINICAL DATA:  Acute kidney injury. EXAM: RENAL / URINARY TRACT ULTRASOUND COMPLETE COMPARISON:  None. FINDINGS: Right Kidney: Renal measurements: 8.4 x  5.0 x 4.2 cm = volume: 94 mL. Increased renal parenchymal echogenicity. No mass or hydronephrosis visualized. No visualized renal calculi. Left Kidney: Renal measurements: 8.3 x 4.4 x 3.7 cm = volume: 7 mL. Increased renal parenchymal echogenicity. There is some cortical scarring in the lateral kidney. No mass or hydronephrosis visualized. No visualized renal calculi. Bladder: Appears normal for degree of bladder distention. Other: None. IMPRESSION: 1. Increased bilateral renal echogenicity consistent with chronic medical renal disease. 2. No hydronephrosis. Electronically Signed   By: Keith Rake M.D.   On: 12/15/2020 21:30   DG CHEST PORT 1 VIEW  Result Date: 12/16/2020 CLINICAL DATA:  Shortness of breath EXAM: PORTABLE CHEST 1 VIEW COMPARISON:   12/15/2020 FINDINGS: Cardiomegaly. Airspace disease throughout the right lung and in the left lung base are unchanged since prior study. No visible effusions or pneumothorax. No acute bony abnormality. IMPRESSION: Bilateral airspace disease, right greater than left again noted, unchanged. Electronically Signed   By: Rolm Baptise M.D.   On: 12/16/2020 06:59     Medications:   . sodium chloride     . aspirin EC  81 mg Oral Daily  . atorvastatin  40 mg Oral q1800  . carvedilol  25 mg Oral BID WC  . feeding supplement  237 mL Oral BID BM  . furosemide  80 mg Oral BID  . heparin  5,000 Units Subcutaneous Q12H  . hydrALAZINE  100 mg Oral Q8H  . influenza vac split quadrivalent PF  0.5 mL Intramuscular Tomorrow-1000  . insulin aspart  0-9 Units Subcutaneous TID WC  . isosorbide mononitrate  60 mg Oral Daily  . melatonin  5 mg Oral QHS  . pneumococcal 23 valent vaccine  0.5 mL Intramuscular Tomorrow-1000  . sodium chloride flush  3 mL Intravenous Q12H   sodium chloride, acetaminophen, benzonatate, chlorpheniramine-HYDROcodone, guaiFENesin-dextromethorphan, levalbuterol, ondansetron (ZOFRAN) IV, sodium chloride flush  Assessment/ Plan:  1.Renal-we know  that in 2019 creatinine was 1.8, and in late October 2021 creatinine was 2.6 in the setting of a very high blood pressure.  In the last 4 months there has been a change of kidney function.  Unknown if this was slow or acute.  Is difficult to identify any recent nephrotoxins.  Renal ultrasound does show fairly small kidneys this may be consistent with renal artery stenosis bilaterally.   Not sure there is much that can be done about this.   His acute kidney injury may be slowly resolving.  Needless to say he probably will be stage IV chronic kidney disease and will need education and access placement.   Will consult vein and vascular surgery for placement of AV fistula. 2. Hypertension/volume  -malignant hypertension which appears to be a theme.     Appears to be making some urine.  I think Lasix may be reasonable choice and will transition to oral Lasix will need 80 mg twice daily.  Urine output excellent.  We will change carvedilol to labetalol 200 mg twice daily 3. Anemia  -does have anemia to a certain extent, but not as low as I would expect back for chronically progressive chronic kidney disease.  No action is needed right now 4.  Hypokalemia-has received replacement.  May need more with moderate dose Lasix- will follow and replete as needed  5.  A. Fib- could be causing some variable renal perfusion-cardiology is on board   LOS: [place] '@TODAY''@10'$ :17 AM

## 2020-12-17 NOTE — Consult Note (Addendum)
[facility] NOTE   MRN : [id number]  Reason for Consult: CKD stage V Referring Physician: Dr. Edrick Oh  History of Present Illness: 65 y/o male with history of CKD, HTN, DM presents to the ED with SOB and LE edema.  We have been asked to consult the patient and establish permanent access.  He is currently not in need of TDC at this time and vein mapping has been ordered.  He denise chest implants.    ASA, Lipitor, No anticoagulation.    Current Facility-Administered Medications  Medication Dose Route Frequency Provider Last Rate Last Admin  . 0.9 %  sodium chloride infusion  250 mL Intravenous PRN Wynetta Fines T, MD      . acetaminophen (TYLENOL) tablet 650 mg  650 mg Oral Q4H PRN Lequita Halt, MD   650 mg at 12/16/20 1431  . aspirin EC tablet 81 mg  81 mg Oral Daily Wynetta Fines T, MD   81 mg at 12/17/20 [id number]  . atorvastatin (LIPITOR) tablet 40 mg  40 mg Oral q1800 Wynetta Fines T, MD   40 mg at 12/16/20 1641  . benzonatate (TESSALON) capsule 200 mg  200 mg Oral TID PRN Swayze, Ava, DO      . chlorpheniramine-HYDROcodone (TUSSIONEX) 10-8 MG/5ML suspension 5 mL  5 mL Oral Q12H PRN Swayze, Ava, DO      . feeding supplement (ENSURE ENLIVE / ENSURE PLUS) liquid 237 mL  237 mL Oral BID BM Sheikh, Omair Latif, DO   237 mL at 12/17/20 0940  . furosemide (LASIX) tablet 80 mg  80 mg Oral BID Edrick Oh, MD   80 mg at 12/17/20 [id number]  . guaiFENesin-dextromethorphan (ROBITUSSIN DM) 100-10 MG/5ML syrup 5 mL  5 mL Oral Q4H PRN Swayze, Ava, DO      . heparin injection 5,000 Units  5,000 Units Subcutaneous Q12H Wynetta Fines T, MD   5,000 Units at 12/17/20 0939  . hydrALAZINE (APRESOLINE) tablet 100 mg  100 mg Oral Q8H Geralynn Rile, MD   100 mg at 12/17/20 [id number]  . influenza vac split quadrivalent PF (FLUARIX) injection 0.5 mL  0.5 mL Intramuscular Tomorrow-1000 Wynetta Fines T, MD      . insulin aspart (novoLOG) injection 0-9 Units  0-9 Units Subcutaneous TID  WC Lequita Halt, MD   1 Units at 12/17/20 0940  . isosorbide mononitrate (IMDUR) 24 hr tablet 60 mg  60 mg Oral Daily Geralynn Rile, MD   60 mg at 12/17/20 0939  . labetalol (NORMODYNE) tablet 200 mg  200 mg Oral BID Edrick Oh, MD      . levalbuterol [facility]) nebulizer solution 0.63 mg  0.63 mg Nebulization Q8H PRN Swayze, Ava, DO      . melatonin tablet 5 mg  5 mg Oral QHS Zierle-Ghosh, Asia B, DO      . minoxidil (LONITEN) tablet 2.5 mg  2.5 mg Oral Daily Swayze, Ava, DO      . ondansetron (ZOFRAN) injection 4 mg  4 mg Intravenous Q6H PRN Wynetta Fines T, MD   4 mg at 12/16/20 1429  . pneumococcal 23 valent vaccine (PNEUMOVAX-23) injection 0.5 mL  0.5 mL Intramuscular Tomorrow-1000 Wynetta Fines T, MD      . sodium chloride flush (NS) 0.9 % injection 3 mL  3 mL Intravenous Q12H Wynetta Fines T, MD   3 mL at 12/17/20 0948  . sodium chloride flush (NS) 0.9 % injection 3 mL  3  mL Intravenous PRN Lequita Halt, MD        Pt meds include: Statin :Yes Betablocker: Yes ASA: Yes Other anticoagulants/antiplatelets: None  Past Medical History:  Diagnosis Date  . Gout    1-2 exacerbations per year.  Takes Advil PRN for pain.  Marland Kitchen Hyperlipidemia   . Hypertension   . Migraine   . Stroke ([place]) 07/01/2012   L sided weakness, slurred speech.  Admission [facility].    . Vitamin D deficiency 08/09/2019    Past Surgical History:  Procedure Laterality Date  . Admission  07/01/2012   CVA (L sided weakness, slurred speech).  [facility].  . COLONOSCOPY  10/31/2008   no polyps.  Repeat in 10 years.  [place].  Marland Kitchen HERNIA REPAIR      Social History Social History   Tobacco Use  . Smoking status: Never Smoker  . Smokeless tobacco: Never Used  Substance Use Topics  . Alcohol use: No    Alcohol/week: 0.0 standard drinks    Comment: former drinker  . Drug use: No    Family History Family History  Problem Relation Age of Onset  . Stroke Mother   . Hypertension Mother   . Hypertension Daughter    . Cancer Maternal Grandmother        lung  . Healthy Father        Died in motorcycle accident    No Known Allergies   REVIEW OF SYSTEMS  General: '[ ]'$  Weight loss, '[ ]'$  Fever, '[ ]'$  chills Neurologic: '[ ]'$  Dizziness, '[ ]'$  Blackouts, '[ ]'$  Seizure '[ ]'$  Stroke, '[ ]'$  "Mini stroke", '[ ]'$  Slurred speech, '[ ]'$  Temporary blindness; '[ ]'$  weakness in arms or legs, '[ ]'$  Hoarseness '[ ]'$  Dysphagia Cardiac: '[ ]'$  Chest pain/pressure, '[ ]'$  Shortness of breath at rest [x ] Shortness of breath with exertion, '[ ]'$  Atrial fibrillation or irregular heartbeat  Vascular: '[ ]'$  Pain in legs with walking, '[ ]'$  Pain in legs at rest, '[ ]'$  Pain in legs at night,  '[ ]'$  Non-healing ulcer, '[ ]'$  Blood clot in vein/DVT,   Pulmonary: '[ ]'$  Home oxygen, '[ ]'$  Productive cough, '[ ]'$  Coughing up blood, '[ ]'$  Asthma,  '[ ]'$  Wheezing '[ ]'$  COPD Musculoskeletal:  '[ ]'$  Arthritis, '[ ]'$  Low back pain, '[ ]'$  Joint pain Hematologic: '[ ]'$  Easy Bruising, '[ ]'$  Anemia; '[ ]'$  Hepatitis Gastrointestinal: '[ ]'$  Blood in stool, '[ ]'$  Gastroesophageal Reflux/heartburn, Urinary: [x ] chronic Kidney disease, '[ ]'$  on HD - '[ ]'$  MWF or '[ ]'$  TTHS, '[ ]'$  Burning with urination, '[ ]'$  Difficulty urinating Skin: '[ ]'$  Rashes, '[ ]'$  Wounds Psychological: '[ ]'$  Anxiety, '[ ]'$  Depression  Physical Examination Vitals:   12/16/20 1932 12/16/20 2024 12/17/20 0327 12/17/20 0939  BP: (!) 129/91  (!) 175/87 (!) 161/98  Pulse:   64 67  Resp: 18  18   Temp: 97.8 F (36.6 C)  98.3 F (36.8 C)   TempSrc: Oral  Oral   SpO2:  95% 94%   Weight:   70.9 kg   Height:   '5\' 9"'$  (1.753 m)    Body mass index is 23.07 kg/m.  General:  WDWN in NAD HENT: WNL Eyes: Pupils equal Pulmonary: normal non-labored breathing , without Rales, rhonchi,  wheezing Cardiac: RRR, without  Murmurs, rubs or gallops; No carotid bruits Abdomen: soft, NT, no masses Skin: no rashes, ulcers noted;  no Gangrene , no cellulitis; no open wounds;   Vascular Exam/Pulses:Palpable  radial pulses B UE   Musculoskeletal: no muscle wasting  or atrophy;  Neurologic: A&O X 3; Depressed SENSATION: normal; MOTOR FUNCTION: 5/5 Symmetric Speech is fluent/normal   Significant Diagnostic Studies: CBC Lab Results  Component Value Date   WBC 8.9 12/16/2020   HGB 11.5 (L) 12/16/2020   HCT 38.4 (L) 12/16/2020   MCV 77.4 (L) 12/16/2020   PLT 192 12/16/2020    BMET    Component Value Date/Time   NA 131 (L) 12/17/2020 0157   NA 136 08/06/2019 1400   K 3.8 12/17/2020 0157   CL 89 (L) 12/17/2020 0157   CO2 24 12/17/2020 0157   GLUCOSE 169 (H) 12/17/2020 0157   BUN 110 (H) 12/17/2020 0157   BUN 20 08/06/2019 1400   CREATININE 4.98 (H) 12/17/2020 0157   CREATININE 1.28 08/19/2015 0844   CALCIUM 8.6 (L) 12/17/2020 0157   GFRNONAA 12 (L) 12/17/2020 0157   GFRNONAA 76 12/01/2014 1037   GFRAA 33 (L) 08/06/2019 1400   GFRAA 88 12/01/2014 1037   Estimated Creatinine Clearance: 15 mL/min (A) (by C-G formula based on SCr of 4.98 mg/dL (H)).  COAG Lab Results  Component Value Date   INR 1.0 08/25/2020   INR 0.98 12/08/2014     Non-Invasive Vascular Imaging:   ASSESSMENT/PLAN:  AKI on CKD stage 4/5 Pending vein mapping we will plan av fistula creation verse graft on the left.  He is right hand dominant.   Elevated troponin's trending down Hypertension emergency better controlled Cr trending down without the need to strt HD urgently per Nephrology    Roxy Horseman 12/17/2020 11:08 AM   I have seen and evaluated the patient. I agree with the PA note as documented above.  65 year old male with hypertension and diabetes admitted with worsening dyspnea and lower extremity edema.  Found to have AKI on CKD.  Likely will be CKD 4 at the end of this admission.  Not currently on dialysis.  Vascular was consulted for permanent dialysis access.  Patient is right-handed and he has no chest wall implants.  He has a palpable left radial and brachial pulse.  Discussed with Dr. Justin Mend who agrees if he has a usable vein would be  great to get access placed during this hospital stay but if no vein and would require graft would likely will delay this.  Vein mapping ordered today.  I have tentatively posted him for OR tomorrow with Dr. Stanford Breed for left arm AVF.  Would cancel if no vein on mapping.  Please keep n.p.o. for midnight.  Marty Heck, MD [facility] [place] Office: [phone]

## 2020-12-18 ENCOUNTER — Inpatient Hospital Stay (HOSPITAL_COMMUNITY): Payer: 59

## 2020-12-18 ENCOUNTER — Encounter (HOSPITAL_COMMUNITY)
Admission: EM | Disposition: A | Discharge status: Skilled Nursing Facility | Payer: Self-pay | Source: Ambulatory Visit | Attending: Internal Medicine

## 2020-12-18 DIAGNOSIS — I5021 Acute systolic (congestive) heart failure: Secondary | ICD-10-CM | POA: Diagnosis not present

## 2020-12-18 DIAGNOSIS — K920 Hematemesis: Secondary | ICD-10-CM

## 2020-12-18 DIAGNOSIS — E43 Unspecified severe protein-calorie malnutrition: Secondary | ICD-10-CM

## 2020-12-18 DIAGNOSIS — E871 Hypo-osmolality and hyponatremia: Secondary | ICD-10-CM

## 2020-12-18 DIAGNOSIS — N1831 Chronic kidney disease, stage 3a: Secondary | ICD-10-CM

## 2020-12-18 DIAGNOSIS — N179 Acute kidney failure, unspecified: Secondary | ICD-10-CM | POA: Diagnosis not present

## 2020-12-18 LAB — BASIC METABOLIC PANEL
Anion gap: 14 (ref 5–15)
BUN: 120 mg/dL — ABNORMAL HIGH (ref 8–23)
CO2: 25 mmol/L (ref 22–32)
Calcium: 8.5 mg/dL — ABNORMAL LOW (ref 8.9–10.3)
Chloride: 90 mmol/L — ABNORMAL LOW (ref 98–111)
Creatinine, Ser: 5.35 mg/dL — ABNORMAL HIGH (ref 0.61–1.24)
GFR, Estimated: 11 mL/min — ABNORMAL LOW (ref >60)
Glucose, Bld: 237 mg/dL — ABNORMAL HIGH (ref 70–99)
Potassium: 3.9 mmol/L (ref 3.5–5.1)
Sodium: 129 mmol/L — ABNORMAL LOW (ref 135–145)

## 2020-12-18 LAB — SURGICAL PCR SCREEN
MRSA, PCR: NEGATIVE
Staphylococcus aureus: NEGATIVE

## 2020-12-18 LAB — CBC
HCT: 36 % — ABNORMAL LOW (ref 39.0–52.0)
Hemoglobin: 11.2 g/dL — ABNORMAL LOW (ref 13.0–17.0)
MCH: 23.3 pg — ABNORMAL LOW (ref 26.0–34.0)
MCHC: 31.1 g/dL (ref 30.0–36.0)
MCV: 74.8 fL — ABNORMAL LOW (ref 80.0–100.0)
Platelets: 246 10*3/uL (ref 150–400)
RBC: 4.81 MIL/uL (ref 4.22–5.81)
RDW: 16.2 % — ABNORMAL HIGH (ref 11.5–15.5)
WBC: 8.5 10*3/uL (ref 4.0–10.5)
nRBC: 0 % (ref 0.0–0.2)

## 2020-12-18 LAB — GLUCOSE, CAPILLARY
Glucose-Capillary: 193 mg/dL — ABNORMAL HIGH (ref 70–99)
Glucose-Capillary: 193 mg/dL — ABNORMAL HIGH (ref 70–99)
Glucose-Capillary: 182 mg/dL — ABNORMAL HIGH (ref 70–99)
Glucose-Capillary: 176 mg/dL — ABNORMAL HIGH (ref 70–99)

## 2020-12-18 LAB — LACTIC ACID, PLASMA: Lactic Acid, Venous: 1 mmol/L (ref 0.5–1.9)

## 2020-12-18 LAB — HEMOGLOBIN AND HEMATOCRIT, BLOOD
HCT: 37.6 % — ABNORMAL LOW (ref 39.0–52.0)
HCT: 37.8 % — ABNORMAL LOW (ref 39.0–52.0)
Hemoglobin: 11.8 g/dL — ABNORMAL LOW (ref 13.0–17.0)
Hemoglobin: 11.8 g/dL — ABNORMAL LOW (ref 13.0–17.0)

## 2020-12-18 LAB — LIPASE, BLOOD: Lipase: 67 U/L — ABNORMAL HIGH (ref 11–51)

## 2020-12-18 LAB — TROPONIN I (HIGH SENSITIVITY): Troponin I (High Sensitivity): 77 ng/L — ABNORMAL HIGH (ref <18)

## 2020-12-18 SURGERY — ARTERIOVENOUS (AV) FISTULA CREATION
Anesthesia: Monitor Anesthesia Care | Laterality: Left

## 2020-12-18 MED ORDER — SODIUM CHLORIDE 1 G PO TABS
1.0000 g | ORAL_TABLET | Freq: Two times a day (BID) | ORAL | Status: DC
  Administered 2020-12-18 – 2020-12-22 (×7): 1 g via ORAL
  Filled 2020-12-18 (×10): qty 1

## 2020-12-18 MED ORDER — PANTOPRAZOLE SODIUM 40 MG IV SOLR
40.0000 mg | Freq: Two times a day (BID) | INTRAVENOUS | Status: DC
  Administered 2020-12-18 – 2020-12-20 (×6): 40 mg via INTRAVENOUS
  Filled 2020-12-18 (×6): qty 40

## 2020-12-18 MED ORDER — MUPIROCIN 2 % EX OINT
1.0000 "application " | TOPICAL_OINTMENT | Freq: Two times a day (BID) | CUTANEOUS | Status: AC
  Administered 2020-12-18 – 2020-12-22 (×9): 1 via NASAL
  Filled 2020-12-18 (×2): qty 22

## 2020-12-18 NOTE — Progress Notes (Signed)
Heart Failure Navigator Progress Note  Assessed for Heart & Vascular TOC clinic readiness.  Unfortunately at this time the patient does not meet criteria due to worsening renal function (SCr 5.35, BUN 120 now).   Navigator available for reassessment of patient. If renal function improves, pt would benefit from [place] clinic appointment upon DC from hospital.  Pricilla Holm, RN, BSN Heart Failure Nurse Navigator [phone]

## 2020-12-18 NOTE — Consult Note (Signed)
Referring Provider: [facility] Primary Care Physician:  Horald Pollen, MD Primary Gastroenterologist:   Screening colonoscopy by Dr. Wynetta Emery [facility]) in 2009  Reason for Consultation:  Coffee ground emesis x1  HPI: Ruben Camacho. is a 65 y.o. male currently hospitalized for acute on chronic CHF in the setting of hypertensive emergency, acute respiratory failure due to CHF/pulmonary edema, and AKI on CKD presenting for consultation of coffee ground emesis x 1.  Patient reports an episode of coffee ground emesis approximately 3 hours ago.  He denies abdominal pain at this time.  Also denies current nausea/vomiting. He further denies changes in bowel habits, melena, or hematochezia.    Per chart review, he has taken some Advil prior to admission but when patient was asked about this today, he states he takes "too many pills" and could not definitively tell me how often he takes NSAIDs.  Denies blood thinner use.  Last colonoscopy 2009: diverticulosis, otherwise normal. Denies family history of colon cancer or gastrointestinal malignancy. He has not had an EGD.   Past Medical History:  Diagnosis Date  . Gout    1-2 exacerbations per year.  Takes Advil PRN for pain.  Marland Kitchen Hyperlipidemia   . Hypertension   . Migraine   . Stroke ([place]) 07/01/2012   L sided weakness, slurred speech.  Admission [facility].    . Vitamin D deficiency 08/09/2019    Past Surgical History:  Procedure Laterality Date  . Admission  07/01/2012   CVA (L sided weakness, slurred speech).  [facility].  . COLONOSCOPY  10/31/2008   no polyps.  Repeat in 10 years.  [place].  Marland Kitchen HERNIA REPAIR      Prior to Admission medications   Medication Sig Start Date End Date Taking? Authorizing Provider  amLODipine (NORVASC) 10 MG tablet Take 1 tablet (10 mg total) by mouth daily. 08/25/20  Yes Noemi Chapel, MD  aspirin 81 MG tablet Take 81 mg by mouth daily.   Yes [provider]  atorvastatin (LIPITOR) 40 MG tablet Take 1  tablet (40 mg total) by mouth daily at 6 PM. 08/25/20 11/23/20 Yes Noemi Chapel, MD  azithromycin (ZITHROMAX) 250 MG tablet Take 250-500 mg by mouth as directed. 12/10/20  Yes [provider]  diclofenac Sodium (VOLTAREN) 1 % GEL Apply 4 g topically 4 (four) times daily. Patient taking differently: Apply 4 g topically 4 (four) times daily as needed (pain). 08/10/20  Yes Deno Etienne, DO  glipiZIDE (GLUCOTROL XL) 2.5 MG 24 hr tablet TAKE 1 TABLET(2.5 MG) BY MOUTH DAILY WITH BREAKFAST Patient not taking: No sig reported 08/25/20   Noemi Chapel, MD  glucose blood (PRECISION QID TEST) test strip Use as directed to check blood sugar 2 times a day before meals. 06/14/19   [provider]  Lancet Devices (SIMPLE DIAGNOSTICS LANCING DEV) MISC Use as directed to check blood sugar 2 times a day before meals. 06/14/19   [provider]  Lancets 30G MISC Use as directed to check blood sugar 2 times a day before meals. 06/14/19   [provider]  losartan-hydrochlorothiazide (HYZAAR) 100-25 MG tablet Take 1 tablet by mouth daily. 08/25/20 09/24/20  Noemi Chapel, MD  NIFEdipine (PROCARDIA XL/NIFEDICAL-XL) 90 MG 24 hr tablet Take 1 tablet (90 mg total) by mouth daily. 08/25/20 09/24/20  Noemi Chapel, MD  colchicine 0.6 MG tablet Take one pill an hour after your first dose in the ED Patient not taking: Reported on 08/25/2020 08/10/20 08/25/20  Deno Etienne, DO  Scheduled Meds: . atorvastatin  40 mg Oral q1800  . citalopram  10 mg Oral Daily  . feeding supplement  237 mL Oral BID BM  . furosemide  80 mg Oral BID  . hydrALAZINE  100 mg Oral Q8H  . influenza vac split quadrivalent PF  0.5 mL Intramuscular Tomorrow-1000  . insulin aspart  0-9 Units Subcutaneous TID WC  . isosorbide mononitrate  60 mg Oral Daily  . labetalol  200 mg Oral BID  . melatonin  5 mg Oral QHS  . minoxidil  2.5 mg Oral Daily  .  morphine injection  4 mg Intravenous Once  . mupirocin ointment  1  application Nasal BID  . pantoprazole (PROTONIX) IV  40 mg Intravenous Q12H  . pneumococcal 23 valent vaccine  0.5 mL Intramuscular Tomorrow-1000  . sodium chloride flush  3 mL Intravenous Q12H   Continuous Infusions: . sodium chloride    .  ceFAZolin (ANCEF) IV     PRN Meds:.sodium chloride, acetaminophen, benzonatate, chlorpheniramine-HYDROcodone, guaiFENesin-dextromethorphan, levalbuterol, LORazepam, ondansetron (ZOFRAN) IV, sodium chloride flush  Allergies as of 12/14/2020  . (No Known Allergies)    Family History  Problem Relation Age of Onset  . Stroke Mother   . Hypertension Mother   . Hypertension Daughter   . Cancer Maternal Grandmother        lung  . Healthy Father        Died in motorcycle accident    Social History   Socioeconomic History  . Marital status: Married    Spouse name: Not on file  . Number of children: 2  . Years of education: Masters  . Highest education level: Not on file  Occupational History  . Occupation: PRINCIPAL    Employer: [employer]  Tobacco Use  . Smoking status: Never Smoker  . Smokeless tobacco: Never Used  Substance and Sexual Activity  . Alcohol use: No    Alcohol/week: 0.0 standard drinks    Comment: former drinker  . Drug use: No  . Sexual activity: Yes    Partners: Female  Other Topics Concern  . Not on file  Social History Narrative   Marital status: married      CHildren;  2 children; no grandchildren.      Lives at home alone.      Employment:  Pharmacist, hospital; principal in past.      Tobacco: none      Alcohol: weekends      Exercise:  Walking; weightlifting; basketball.   Right handed.   2 cups caffeine/day.   Social Determinants of Health   Financial Resource Strain: Medium Risk  . Difficulty of Paying Living Expenses: Somewhat hard  Food Insecurity: No Food Insecurity  . Worried About Charity fundraiser in the Last Year: Never true  . Ran Out of Food in the Last Year: Never true  Transportation  Needs: No Transportation Needs  . Lack of Transportation (Medical): No  . Lack of Transportation (Non-Medical): No  Physical Activity: Inactive  . Days of Exercise per Week: 0 days  . Minutes of Exercise per Session: 0 min  Stress: Not on file  Social Connections: Not on file  Intimate Partner Violence: Not on file    Review of Systems: Review of Systems  Constitutional: Positive for malaise/fatigue. Negative for chills and fever.  HENT: Negative for hearing loss and tinnitus.   Eyes: Negative for pain and redness.  Respiratory: Negative for cough and shortness of breath.   Cardiovascular:  Negative for chest pain and palpitations.  Gastrointestinal: Positive for nausea and vomiting. Negative for abdominal pain, blood in stool, constipation, diarrhea, heartburn and melena.  Genitourinary: Negative for dysuria and flank pain.  Musculoskeletal: Negative for falls and joint pain.  Skin: Negative for itching and rash.  Neurological: Negative for seizures and loss of consciousness.  Endo/Heme/Allergies: Negative for polydipsia. Does not bruise/bleed easily.  Psychiatric/Behavioral: Negative for substance abuse. The patient is not nervous/anxious.     Physical Exam: Vital signs: Vitals:   12/18/20 0817 12/18/20 0821  BP: (!) 153/94 (!) 153/94  Pulse: 72 68  Resp: 20   Temp: 97.7 F (36.5 C)   SpO2: 96%    Last BM Date: 12/13/20 Physical Exam Vitals reviewed.  Constitutional:      General: He is not in acute distress.    Comments: Thin, chronically ill-appearing  HENT:     Head: Normocephalic and atraumatic.     Nose: Nose normal. No congestion.     Mouth/Throat:     Mouth: Mucous membranes are moist.     Pharynx: Oropharynx is clear.  Eyes:     Extraocular Movements: Extraocular movements intact.     Conjunctiva/sclera: Conjunctivae normal.  Cardiovascular:     Rate and Rhythm: Normal rate and regular rhythm.     Pulses: Normal pulses.  Pulmonary:     Effort:  Pulmonary effort is normal.     Breath sounds: Normal breath sounds.  Abdominal:     General: Bowel sounds are normal. There is no distension.     Palpations: Abdomen is soft. There is no mass.     Tenderness: There is no abdominal tenderness. There is no guarding or rebound.     Hernia: No hernia is present.  Musculoskeletal:        General: No swelling or tenderness.     Cervical back: Normal range of motion and neck supple.  Skin:    General: Skin is warm and dry.  Neurological:     General: No focal deficit present.     Mental Status: He is oriented to person, place, and time. He is lethargic.  Psychiatric:        Mood and Affect: Mood normal.        Behavior: Behavior normal.      GI:  Lab Results: Recent Labs    12/16/20 0138 12/18/20 0003  WBC 8.9 8.5  HGB 11.5* 11.2*  HCT 38.4* 36.0*  PLT 192 246   BMET Recent Labs    12/16/20 0138 12/17/20 0157 12/18/20 0003  NA 135 131* 129*  K 4.0 3.8 3.9  CL 97* 89* 90*  CO2 '23 24 25  '$ GLUCOSE 195* 169* 237*  BUN 101* 110* 120*  CREATININE 4.72* 4.98* 5.35*  CALCIUM 8.6* 8.6* 8.5*   LFT Recent Labs    12/16/20 0138  PROT 6.2*  ALBUMIN 2.9*  AST 22  ALT 36  ALKPHOS 75  BILITOT 1.0   PT/INR No results for input(s): LABPROT, INR in the last 72 hours.   Studies/Results: DG Abd 1 View  Result Date: 12/18/2020 CLINICAL DATA:  Abdominal pain with nausea vomiting. EXAM: ABDOMEN - 1 VIEW COMPARISON:  None. FINDINGS: The bowel gas pattern is normal. No radio-opaque calculi or other significant radiographic abnormality are seen. IMPRESSION: Negative. Electronically Signed   By: Misty Stanley M.D.   On: 12/18/2020 10:14   VAS [place] UPPER EXT VEIN MAPPING (PRE-OP AVF)  Result Date: 12/17/2020 UPPER EXTREMITY VEIN MAPPING  Indications: Pre-access. History: CKD stage IV/V.  Limitations: Body habitus, patient unable to keep arm extended and rotated. Comparison Study: No prior study on file Performing Technologist: Sharion Dove RVS  Examination Guidelines: A complete evaluation includes B-mode imaging, spectral Doppler, color Doppler, and power Doppler as needed of all accessible portions of each vessel. Bilateral testing is considered an integral part of a complete examination. Limited examinations for reoccurring indications may be performed as noted. +-----------------+-------------+----------+--------+ Right Cephalic   Diameter (cm)Depth (cm)Findings +-----------------+-------------+----------+--------+ Prox upper arm       0.16        0.34            +-----------------+-------------+----------+--------+ Mid upper arm        0.20        0.31            +-----------------+-------------+----------+--------+ Dist upper arm       0.17        0.36            +-----------------+-------------+----------+--------+ Antecubital fossa    0.22        0.38            +-----------------+-------------+----------+--------+ Prox forearm         0.21        0.45            +-----------------+-------------+----------+--------+ Mid forearm          0.22        0.33            +-----------------+-------------+----------+--------+ Wrist                0.19        0.21            +-----------------+-------------+----------+--------+ +-----------------+-------------+----------+--------+ Left Cephalic    Diameter (cm)Depth (cm)Findings +-----------------+-------------+----------+--------+ Antecubital fossa    0.40        0.37            +-----------------+-------------+----------+--------+ Prox forearm         0.23        0.41            +-----------------+-------------+----------+--------+ Mid forearm          0.21        0.31            +-----------------+-------------+----------+--------+ Wrist                0.19        0.44            +-----------------+-------------+----------+--------+ +-----------------+-------------+----------+--------+ Left Basilic     Diameter (cm)Depth  (cm)Findings +-----------------+-------------+----------+--------+ Dist upper arm       0.26        0.67    origin  +-----------------+-------------+----------+--------+ Antecubital fossa    0.24        0.89            +-----------------+-------------+----------+--------+ Prox forearm         0.28        0.32            +-----------------+-------------+----------+--------+ Mid forearm          0.17        0.36            +-----------------+-------------+----------+--------+ Wrist                0.14        0.30            +-----------------+-------------+----------+--------+ *  See table(s) above for measurements and observations.  Diagnosing physician: Monica Martinez MD Electronically signed by Monica Martinez MD on 12/17/2020 at 4:30:22 PM.    Final     Impression: Coffee ground emesis x 1: concern for PUD vs. Gastritis -Hgb 11.8, stable since admission on 2/14 -BUN and Cr both elevated in the setting of AKI on CKD.  Slightly increased from admission but fairly stable overall.  AKI on CKD: BUN 120/Cr 5.35  Acute on chronic CHF   Plan: Recommend EGD for further evaluation of coffee ground emesis.  Explained to patient that emesis suggests upper GI bleeding, and EGD would allow [place] to further evaluate source and potentially treat. Patient decline endoscopy but would not give reason for refusal, just stated he did not want it to be done.  Continue Protonix 40 mg IV BID for now.  If no further episodes of bleeding and Hgb remains stable over the next few days, can consider decreasing to once daily dosing due to AKI on CKD.  Continue to monitor H&H.    [facility] will sign off.  Please recall [place] if Hgb continues to decrease and/or patient is willing to undergo endoscopic evaluation.   LOS: 4 days   Salley Slaughter  PA-C 12/18/2020, 11:33 AM  Contact #  [phone]

## 2020-12-18 NOTE — Care Management Important Message (Signed)
Important Message  Patient Details  Name: Ruben Camacho. MRN: [id number] Date of Birth: 1956/08/19   Medicare Important Message Given:  Yes     Shelda Altes 12/18/2020, 9:18 AM

## 2020-12-18 NOTE — Progress Notes (Signed)
PROGRESS NOTE  Ruben Camacho. [id number] DOB: [birth year] DOA: 12/14/2020 PCP: Horald Pollen, MD  Brief History   Ruben Camacho a 65 y.o.malewith medical history significant ofHTN, CKD stage IIIB, IIDM,HLD, presented with increasing shortness of breath.  Symptoms started about 10 to 14 days ago, gradually gotten worse, also has had dry cough and then became more productive in the last 2 to 3 days with whitish foamy sputum, no chest pain no fever chills. Overnight unable to lie down, and unable to catch breath even at rest this morning. Then called EMS.  At baseline, check blood pressure every now and then at home and found most occasions SBP>1 70-1 80, no recent BP medication adjustment. Patient reported he has been compliant with his BP meds. EMS arrived, found patient blood pressure more than 200, glucose 320, O2 saturation 88% on room air then stabilized on 3 L via nasal cannula. ED Course:Pulmonary edema, blood pressure 250,Cardene drip started and BP dropped to 140s in 3 hours. Blood work showed AKI on CKD creatinine 5.0 compared to 2.6 baseline, K3.1, glucose 262. Troponin 166. EKG LVH no acute ST-T changes. Chest x-ray pulmonary edema.  Continues to be significantly dyspneic and cough.  Still appears somewhat volume overloaded.  Very dyspneic to speak.  We will increase her diuresis and consult cardiology.  Lasix 80 mg bid started. Patient still hypertensive today.  Nephrology has been consulted. They have consulted vascular surgery. Pt will be evaluated for permanent vascular access today. Vein mapping on 12/17/2020. Patient refused placement of access today.  Nursing has advised me that the patient had an episode of large volume emesis of coffee ground material. Heparin and ASA were stopped. The patient has been started on bid protonix. Hemoglobin will be followed and the patient has been placed on a clear liquid diet. GI was consulted and recommended  endoscopy. The patient has refused.  Consultants  . Nephrology . Cardiology . Gastroenterology  Procedures  . None  Antibiotics   Anti-infectives (From admission, onward)   Start     Dose/Rate Route Frequency Ordered Stop   12/18/20 0000  ceFAZolin (ANCEF) IVPB 1 g/50 mL premix  Status:  Discontinued       Note to Pharmacy: Send with pt to OR   1 g 100 mL/hr over 30 Minutes Intravenous On call 12/17/20 1511 12/17/20 1513   12/18/20 0000  ceFAZolin (ANCEF) IVPB 2g/100 mL premix       Note to Pharmacy: Send with pt to OR   2 g 200 mL/hr over 30 Minutes Intravenous On call 12/17/20 1513 12/19/20 0000     Interval History/Subjective  The patient is resting in bed. The patient has had coffee ground emesis today. No other new complaints.  Objective   Vitals:  Vitals:   12/18/20 1134 12/18/20 1423  BP: 129/78 122/71  Pulse: 63   Resp: 18   Temp: (!) 97.5 F (36.4 C)   SpO2: 96%    Exam:  Constitutional:  . The patient is awake, alert, and oriented x 3. No acute distress.  Respiratory:  . No increased work of breathing. . No wheezes, rales, or rhonchi . No tactile fremitus Cardiovascular:  . Regular rate and rhythm . No murmurs, ectopy, or gallups. . No lateral PMI. No thrills. Abdomen:  . Abdomen is soft, non-tender, non-distended . No hernias, masses, or organomegaly . Normoactive bowel sounds.  Musculoskeletal:  . No cyanosis, clubbing, or edema Skin:  . No rashes, lesions, ulcers .  palpation of skin: no induration or nodules Neurologic:  . CN 2-12 intact . Sensation all 4 extremities intact Psychiatric:  . Mental status o Mood, affect appropriate o Orientation to person, place, time  . judgment and insight appear intact  I have personally reviewed the following:   Today's Data  . Vitals, Hemoglobin and hematocrit.  Imaging  . CXR  Cardiology Data  . EKG . Echocardiogram  Scheduled Meds: . atorvastatin  40 mg Oral q1800  . citalopram  10  mg Oral Daily  . feeding supplement  237 mL Oral BID BM  . furosemide  80 mg Oral BID  . hydrALAZINE  100 mg Oral Q8H  . influenza vac split quadrivalent PF  0.5 mL Intramuscular Tomorrow-1000  . insulin aspart  0-9 Units Subcutaneous TID WC  . isosorbide mononitrate  60 mg Oral Daily  . labetalol  200 mg Oral BID  . melatonin  5 mg Oral QHS  . minoxidil  2.5 mg Oral Daily  .  morphine injection  4 mg Intravenous Once  . mupirocin ointment  1 application Nasal BID  . pantoprazole (PROTONIX) IV  40 mg Intravenous Q12H  . pneumococcal 23 valent vaccine  0.5 mL Intramuscular Tomorrow-1000  . sodium chloride flush  3 mL Intravenous Q12H   Continuous Infusions: . sodium chloride    .  ceFAZolin (ANCEF) IV     Active Problems:   CHF (congestive heart failure) (HCC)   Protein-calorie malnutrition, severe   LOS: 4 days   A & P  Acute on Chronic Combined Sysotlic and Diastolic CHF Decompensation secondary to Hypertensive Emergency  -Discussed with ED physician, appeared thatCardenedrip droppedBP too fast, will discontinue Cardene drip. -BNP on Admission was 3,171.0  -Troponin 166 -> 155 -Continue to monitor BP per Protocol  -Continue Amlodipine 10 mg po Daily, hold his ARB and HCTZ and nifedipine. -Started Hydralazine 10 mg po q8h but will titrate up to 25 mg TID and Imdur 30 mg po Daily  -Lasix 40 mg x 1 yesterday and renewed at 40 mg IV q12h -Strict I's and O's and Daily Weights; Patient is -352 mL -Repeat chest x-ray this AM showed "Cardiomegaly with pulmonary venous congestion again noted. Bilateral pulmonary infiltrates/edema right side greater than left again noted." -Continue to Monitor for S/Sx of Volume Overload Patient remains hypertensive.  Acute Respiratory Failure with Hypoxia from Above The patient was saturating at 95-100% on room air for most of the day. In the last hour the patient has had decreased his SaO2 to 84%. CXR is unchanged. Given DuoNeb x1; Will add  Scheduled Xopenex/Atrovent, Flutter Valve and Incentive Spirometry -Continue Supplemental O2 via [place] and Wean O2 as tolerated -Continuous Pulse Oximetry and Maintain O2 Saturations >90% -Will need an Ambulatory Home O2 Screen prior to D/C and repeat CXR in the AM  -Stated Antitussives for Cough   Coffee Ground Emesis: Anticoagulation and ASA stopped. Pt placed on clear liquid diet. Protonix bid started. GI consulted and has recommended EGD, but the patient refuses the procedure. Monitor hemoglobin.   Hypertension Emergency Hypertension is better controlled. -C/w Amlodipine 10 mg po Daily, Increase Hydralazine 10 mg po q8h -> 25 mg po TID, Hold ARB HCTZ and Nifedipine -C/w Imdur 30 mg po Daily  -C/w Lasix Diuresis  -Continue to Monitor BP per Protocol  Most recent BP was 138/82.  Elevated Troponins -Likely demand ischemia from HTN emergency and CHF decompensation as well as AKI. -No significant ST changes on EKG,  -Troponin  I went from 166 -> 155 -Will trend troponins and ordered echocardiogram,  -No chest pain,, will not start ACS meds. -Continue aspirin and statin. -Will Consult Cardiology   AKI onCKD stage IIIb Hyperphosphatemia -In the Setting Fluid overload -Patient's BUN/Cr is now gone from 96/5.03 -> 101/5.03> 101/4.72>120/5/35 -Avoid Nephrotoxic Medications, Contrast Dyes, Hypotension and Renally adjust medications  -Diuresis with caution and repeat chest x-ray  -Continue to Monitor and Trend Renal Fxn -Nephrology consulted.  -Vascular surgery evaluating patient for permanent HD access today Pt has refused renal artery duplex.  -Vein mapping today. -Repeat BMP in the AM   Anxiety/Depression: Will start celexa. Will also make low dose ativan available to the patient on an as needed basis.  Elevated D-Dimer -Trending down; D-Dimer went from 4.41 -> 3.15 -> 2.73 -No significant PE risk, likely reflect acute cardio-pulmonary changes -Repeat one reading in AM to  determine further image study.  Hyponatremia:  Na down to 129 this am. Likely due to diuresis and increase Na excretion due to loop diuresis. Will add NaCl 1 g PO bid. Monitor.  Hypokalemia: Resolved.  -K+ was 3.1 and improved to 3.8 -Mag Level was 2.2 -Replete with po KCl 40 mEQ x1 -Continue to Monitor and Replete as Necessary -Repeat CMP in AM   Microcytic Anemia  -Patient's Hgb/Hct went from 11.7/38.1 -> 11.0/36.1 -Check Anemia Panel in the AM -Continue to Monitor for S/Sx of Bleeding; Currently no overt bleeding noted -Repeat CBC in the AM   IIDMwith Hyperglycemia -Hold oral medications -Started SSI with Sensitive Novolog AC -Check HbA1c in the AM -CBGs ranging from 177-205   DVT prophylaxis: Heparin 5,000 units sq q8h given Renal FXn  Code Status: FULL CODE  Family Communication: No family present at bedside  Disposition Plan: Pending further clinical improvement and clearance by Cardiology   Status is: Inpatient  Remains inpatient appropriate because:Unsafe d/c plan, IV treatments appropriate due to intensity of illness or inability to take PO and Inpatient level of care appropriate due to severity of illness   Dispo: The patient is from: Home  Anticipated d/c is to: TBD  Anticipated d/c date is: 2 days  Patient currently is not medically stable to d/c.              Difficult to place patient No   Egidio Lofgren, DO [facility] Direct contact: see [email or web address]  7PM-7AM contact night coverage as above 12/18/2020, 3:41 PM  LOS: 2 days

## 2020-12-18 NOTE — Progress Notes (Signed)
Ruben Camacho ROUNDING NOTE   Subjective:   Brief history: This is a 65 year old gentleman with history of diabetes hypertension which has been malignant times, hyperlipidemia history of stroke, gout stage IV chronic kidney disease with a baseline serum creatinine by 2.05 August 2020  He presented with dyspnea and lower extremity swelling.  He was also found to have a blood pressure was elevated to [id number] systolic.  His blood sugar was 320 and creatinine elevated at 5 mg/dL with chest x-ray which was consistent with pulmonary edema.  There is reported history of nonsteroidal anti-inflammatory drug use.   Urinalysis showed no white blood cells no red blood cells.  There was greater than 300 mg of proteinuria noted on 08/25/2020 however only 100 mg/dL noted on 12/15/2020 Renal ultrasound showed increased bilateral renal echogenicity no hydronephrosis.  Kidney size appears to be small bilaterally.  Interval History: Largely unchanged.  Creatinine about the same.  Urine output not robust.  Underwent vein mapping without an adequate vein for AVF creation.     Objective:  Vital signs in last 24 hours:  Temp:  [97.7 F (36.5 C)-98.2 F (36.8 C)] 97.7 F (36.5 C) (02/18 0817) Pulse Rate:  [65-72] 68 (02/18 0821) Resp:  [18-20] 20 (02/18 0817) BP: (114-161)/(84-100) 153/94 (02/18 0821) SpO2:  [96 %-97 %] 96 % (02/18 0817) Weight:  [72.5 kg] 72.5 kg (02/18 0443)  Weight change: 1.648 kg Filed Weights   12/16/20 0530 12/17/20 0327 12/18/20 0443  Weight: 70.4 kg 70.9 kg 72.5 kg    Intake/Output: I/O last 3 completed shifts: In: 660 [P.O.:660] Out: 1500 [Urine:1500]   Intake/Output this shift:  No intake/output data recorded.  GEN: Thin, sitting in bed, nad ENT: no nasal discharge, mmm EYES: no scleral icterus, eomi CV: normal rate, regular rhythm PULM: no iwob, bilateral chest rise ABD: NABS, non-distended SKIN: no rashes or jaundice EXT: Trace edema, warm and well  perfused   Basic Metabolic Panel: Recent Labs  Lab 12/14/20 0952 12/15/20 0147 12/15/20 0901 12/16/20 0138 12/17/20 0157 12/18/20 0003  NA 133* 135  --  135 131* 129*  K 3.1* 3.4*  --  4.0 3.8 3.9  CL 93* 96*  --  97* 89* 90*  CO2 24 25  --  '23 24 25  '$ GLUCOSE 262* 230*  --  195* 169* 237*  BUN 96* 101*  --  101* 110* 120*  CREATININE 5.03* 5.03*  --  4.72* 4.98* 5.35*  CALCIUM 8.4* 8.1*  --  8.6* 8.6* 8.5*  MG  --  2.4 2.2 2.3  --   --   PHOS  --   --  5.9* 5.3*  --   --     Liver Function Tests: Recent Labs  Lab 12/14/20 0952 12/16/20 0138  AST 30 22  ALT 49* 36  ALKPHOS 84 75  BILITOT 0.9 1.0  PROT 6.5 6.2*  ALBUMIN 3.1* 2.9*   Recent Labs  Lab 12/18/20 0003  LIPASE 67*   No results for input(s): AMMONIA in the last 168 hours.  CBC: Recent Labs  Lab 12/14/20 0952 12/15/20 0901 12/16/20 0138 12/18/20 0003  WBC 9.2 9.8 8.9 8.5  NEUTROABS  --  8.3* 7.3  --   HGB 11.7* 11.0* 11.5* 11.2*  HCT 38.1* 36.1* 38.4* 36.0*  MCV 76.4* 76.8* 77.4* 74.8*  PLT 200 174 192 246    Cardiac Enzymes: No results for input(s): CKTOTAL, CKMB, CKMBINDEX, TROPONINI in the last 168 hours.  BNP: Invalid input(s): POCBNP  CBG: Recent Labs  Lab 12/17/20 0831 12/17/20 1130 12/17/20 1748 12/17/20 2123 12/18/20 0812  GLUCAP 149* 189* 192* 172* 193*    Microbiology: Results for orders placed or performed during the hospital encounter of 12/14/20  Resp Panel by RT-PCR (Flu A&B, Covid) Nasopharyngeal Swab     Status: None   Collection Time: 12/14/20 11:00 AM   Specimen: Nasopharyngeal Swab; Nasopharyngeal(NP) swabs in vial transport medium  Result Value Ref Range Status   SARS Coronavirus 2 by RT PCR NEGATIVE NEGATIVE Final    Comment: (NOTE) SARS-CoV-2 target nucleic acids are NOT DETECTED.  The SARS-CoV-2 RNA is generally detectable in upper respiratory specimens during the acute phase of infection. The lowest concentration of SARS-CoV-2 viral copies this assay  can detect is 138 copies/mL. A negative result does not preclude SARS-Cov-2 infection and should not be used as the sole basis for treatment or other patient management decisions. A negative result may occur with  improper specimen collection/handling, submission of specimen other than nasopharyngeal swab, presence of viral mutation(s) within the areas targeted by this assay, and inadequate number of viral copies(<138 copies/mL). A negative result must be combined with clinical observations, patient history, and epidemiological information. The expected result is Negative.  Fact Sheet for Patients:  EntrepreneurPulse.com.au  Fact Sheet for Healthcare Providers:  IncredibleEmployment.be  This test is no t yet approved or cleared by the [place] FDA and  has been authorized for detection and/or diagnosis of SARS-CoV-2 by FDA under an Emergency Use Authorization (EUA). This EUA will remain  in effect (meaning this test can be used) for the duration of the COVID-19 declaration under Section 564(b)(1) of the Act, 21 U.S.C.section 360bbb-3(b)(1), unless the authorization is terminated  or revoked sooner.       Influenza A by PCR NEGATIVE NEGATIVE Final   Influenza B by PCR NEGATIVE NEGATIVE Final    Comment: (NOTE) The Xpert Xpress SARS-CoV-2/FLU/RSV plus assay is intended as an aid in the diagnosis of influenza from Nasopharyngeal swab specimens and should not be used as a sole basis for treatment. Nasal washings and aspirates are unacceptable for Xpert Xpress SARS-CoV-2/FLU/RSV testing.  Fact Sheet for Patients: EntrepreneurPulse.com.au  Fact Sheet for Healthcare Providers: IncredibleEmployment.be  This test is not yet approved or cleared by the [place] FDA and has been authorized for detection and/or diagnosis of SARS-CoV-2 by FDA under an Emergency Use Authorization (EUA). This EUA will  remain in effect (meaning this test can be used) for the duration of the COVID-19 declaration under Section 564(b)(1) of the Act, 21 U.S.C. section 360bbb-3(b)(1), unless the authorization is terminated or revoked.  Performed at [facility], [place] [address], [place], [place] [address]   Surgical PCR screen     Status: None   Collection Time: 12/18/20  6:34 AM   Specimen: Nasal Mucosa; Nasal Swab  Result Value Ref Range Status   MRSA, PCR NEGATIVE NEGATIVE Final   Staphylococcus aureus NEGATIVE NEGATIVE Final    Comment: (NOTE) The Xpert SA Assay (FDA approved for NASAL specimens in patients 34 years of age and older), is one component of a comprehensive surveillance program. It is not intended to diagnose infection nor to guide or monitor treatment. Performed at [facility], [place] [address], [place], [place] [address]     Coagulation Studies: No results for input(s): LABPROT, INR in the last 72 hours.  Urinalysis: Recent Labs    12/15/20 2334  COLORURINE STRAW*  LABSPEC 1.009  PHURINE 6.0  GLUCOSEU 50*  HGBUR NEGATIVE  BILIRUBINUR NEGATIVE  KETONESUR NEGATIVE  PROTEINUR 100*  NITRITE NEGATIVE  LEUKOCYTESUR NEGATIVE      Imaging: VAS [place] UPPER EXT VEIN MAPPING (PRE-OP AVF)  Result Date: 12/17/2020 UPPER EXTREMITY VEIN MAPPING  Indications: Pre-access. History: CKD stage IV/V.  Limitations: Body habitus, patient unable to keep arm extended and rotated. Comparison Study: No prior study on file Performing Technologist: Sharion Dove RVS  Examination Guidelines: A complete evaluation includes B-mode imaging, spectral Doppler, color Doppler, and power Doppler as needed of all accessible portions of each vessel. Bilateral testing is considered an integral part of a complete examination. Limited examinations for reoccurring indications may be performed as noted. +-----------------+-------------+----------+--------+ Right Cephalic   Diameter (cm)Depth  (cm)Findings +-----------------+-------------+----------+--------+ Prox upper arm       0.16        0.34            +-----------------+-------------+----------+--------+ Mid upper arm        0.20        0.31            +-----------------+-------------+----------+--------+ Dist upper arm       0.17        0.36            +-----------------+-------------+----------+--------+ Antecubital fossa    0.22        0.38            +-----------------+-------------+----------+--------+ Prox forearm         0.21        0.45            +-----------------+-------------+----------+--------+ Mid forearm          0.22        0.33            +-----------------+-------------+----------+--------+ Wrist                0.19        0.21            +-----------------+-------------+----------+--------+ +-----------------+-------------+----------+--------+ Left Cephalic    Diameter (cm)Depth (cm)Findings +-----------------+-------------+----------+--------+ Antecubital fossa    0.40        0.37            +-----------------+-------------+----------+--------+ Prox forearm         0.23        0.41            +-----------------+-------------+----------+--------+ Mid forearm          0.21        0.31            +-----------------+-------------+----------+--------+ Wrist                0.19        0.44            +-----------------+-------------+----------+--------+ +-----------------+-------------+----------+--------+ Left Basilic     Diameter (cm)Depth (cm)Findings +-----------------+-------------+----------+--------+ Dist upper arm       0.26        0.67    origin  +-----------------+-------------+----------+--------+ Antecubital fossa    0.24        0.89            +-----------------+-------------+----------+--------+ Prox forearm         0.28        0.32            +-----------------+-------------+----------+--------+ Mid forearm          0.17        0.36             +-----------------+-------------+----------+--------+  Wrist                0.14        0.30            +-----------------+-------------+----------+--------+ *See table(s) above for measurements and observations.  Diagnosing physician: Monica Martinez MD Electronically signed by Monica Martinez MD on 12/17/2020 at 4:30:22 PM.    Final      Medications:   . sodium chloride    .  ceFAZolin (ANCEF) IV     . aspirin EC  81 mg Oral Daily  . atorvastatin  40 mg Oral q1800  . citalopram  10 mg Oral Daily  . feeding supplement  237 mL Oral BID BM  . furosemide  80 mg Oral BID  . heparin  5,000 Units Subcutaneous Q12H  . hydrALAZINE  100 mg Oral Q8H  . influenza vac split quadrivalent PF  0.5 mL Intramuscular Tomorrow-1000  . insulin aspart  0-9 Units Subcutaneous TID WC  . isosorbide mononitrate  60 mg Oral Daily  . labetalol  200 mg Oral BID  . melatonin  5 mg Oral QHS  . minoxidil  2.5 mg Oral Daily  .  morphine injection  4 mg Intravenous Once  . mupirocin ointment  1 application Nasal BID  . pneumococcal 23 valent vaccine  0.5 mL Intramuscular Tomorrow-1000  . sodium chloride flush  3 mL Intravenous Q12H   sodium chloride, acetaminophen, benzonatate, chlorpheniramine-HYDROcodone, guaiFENesin-dextromethorphan, levalbuterol, LORazepam, ondansetron (ZOFRAN) IV, sodium chloride flush  Assessment/ Plan:  1.AKI on CKD: Creatinine 2.6 in October 2021.  Rapidly progressing kidney loss.  Ultrasound with small kidneys.  Possible renal artery stenosis based on kidney size.  -Continue monitor kidney function daily; there is still some hope for recovery -No urgent need for dialysis at this time -Consider [facility] and AVG placement if the patient does not improve; appreciate vascular surgery involvement -Monitor urine output for signs of recovery 2. Hypertension/volume  -malignant hypertension which appears to be a theme.    Blood pressure much improved.  Continue Lasix twice daily.   Renal artery PVL given history of recurrent intermittent hypertension with volume overload. 3. Anemia  -does have anemia to a certain extent, but not as low as I would expect back for chronically progressive chronic kidney disease.  No action is needed right now 4.  Hypokalemia-has received replacement.  May need more with moderate dose Lasix- will follow and replete as needed  5.  A. Fib- could be causing some variable renal perfusion-cardiology is on board 6. Hyponatremia: Sodium 129 which is newly low today.  Would suggest volume excess related to decreased renal function, however chloride is also low so volume depletion is possible but less likely.  We will continue to monitor for now   LOS: 4 Ruben Camacho '@TODAY''@9'$ :12 AM

## 2020-12-18 NOTE — Progress Notes (Signed)
Pt was removing leads for telemetry monitoring, removing gown. Complaining of 8/10 LUQ abdominal pain; EKG obtained Zierle-Ghosh, MD paged. New orders were placed; pt refused to be transported for X-ray. Pt stated that he was no longer in any pain and that he just wanted to be left alone so that he could sleep. MD made aware, will continue to monitor.   Elaina Hoops, RN

## 2020-12-18 NOTE — Progress Notes (Signed)
No adequate vein for AVF creation on vein mapping. OR canceled for today. OK for diet from my perspective. We are available should patient need tunneled dialysis catheter or AVG sooner than anticipated.  Will discuss with patient.  Ruben Camacho. Stanford Breed, MD [facility] Phone Number: [phone] 12/18/2020 7:49 AM

## 2020-12-18 NOTE — CV Procedure (Signed)
Renal Arterial Duplex attempted in department, however patient is refusing exam at this time.  Bonnita Nasuti, RN made aware.   12/18/2020 10:32 AM Folasade Mooty RVT, RDMS

## 2020-12-19 DIAGNOSIS — K920 Hematemesis: Secondary | ICD-10-CM | POA: Diagnosis not present

## 2020-12-19 DIAGNOSIS — N179 Acute kidney failure, unspecified: Secondary | ICD-10-CM | POA: Diagnosis not present

## 2020-12-19 DIAGNOSIS — E43 Unspecified severe protein-calorie malnutrition: Secondary | ICD-10-CM | POA: Diagnosis not present

## 2020-12-19 DIAGNOSIS — I5021 Acute systolic (congestive) heart failure: Secondary | ICD-10-CM | POA: Diagnosis not present

## 2020-12-19 LAB — HEMOGLOBIN AND HEMATOCRIT, BLOOD
HCT: 33.5 % — ABNORMAL LOW (ref 39.0–52.0)
HCT: 31.5 % — ABNORMAL LOW (ref 39.0–52.0)
Hemoglobin: 10.9 g/dL — ABNORMAL LOW (ref 13.0–17.0)
Hemoglobin: 10.3 g/dL — ABNORMAL LOW (ref 13.0–17.0)

## 2020-12-19 LAB — CBC WITH DIFFERENTIAL/PLATELET
Abs Immature Granulocytes: 0.05 10*3/uL (ref 0.00–0.07)
Basophils Absolute: 0 10*3/uL (ref 0.0–0.1)
Basophils Relative: 0 %
Eosinophils Absolute: 0 10*3/uL (ref 0.0–0.5)
Eosinophils Relative: 0 %
HCT: 35.4 % — ABNORMAL LOW (ref 39.0–52.0)
Hemoglobin: 11.2 g/dL — ABNORMAL LOW (ref 13.0–17.0)
Immature Granulocytes: 1 %
Lymphocytes Relative: 8 %
Lymphs Abs: 0.7 10*3/uL (ref 0.7–4.0)
MCH: 23.9 pg — ABNORMAL LOW (ref 26.0–34.0)
MCHC: 31.6 g/dL (ref 30.0–36.0)
MCV: 75.6 fL — ABNORMAL LOW (ref 80.0–100.0)
Monocytes Absolute: 0.5 10*3/uL (ref 0.1–1.0)
Monocytes Relative: 6 %
Neutro Abs: 7.4 10*3/uL (ref 1.7–7.7)
Neutrophils Relative %: 85 %
Platelets: 252 10*3/uL (ref 150–400)
RBC: 4.68 MIL/uL (ref 4.22–5.81)
RDW: 16.1 % — ABNORMAL HIGH (ref 11.5–15.5)
WBC: 8.6 10*3/uL (ref 4.0–10.5)
nRBC: 0 % (ref 0.0–0.2)

## 2020-12-19 LAB — GLUCOSE, CAPILLARY
Glucose-Capillary: 164 mg/dL — ABNORMAL HIGH (ref 70–99)
Glucose-Capillary: 274 mg/dL — ABNORMAL HIGH (ref 70–99)
Glucose-Capillary: 213 mg/dL — ABNORMAL HIGH (ref 70–99)
Glucose-Capillary: 133 mg/dL — ABNORMAL HIGH (ref 70–99)

## 2020-12-19 LAB — BASIC METABOLIC PANEL
Anion gap: 21 — ABNORMAL HIGH (ref 5–15)
BUN: 134 mg/dL — ABNORMAL HIGH (ref 8–23)
CO2: 22 mmol/L (ref 22–32)
Calcium: 8.8 mg/dL — ABNORMAL LOW (ref 8.9–10.3)
Chloride: 90 mmol/L — ABNORMAL LOW (ref 98–111)
Creatinine, Ser: 6.12 mg/dL — ABNORMAL HIGH (ref 0.61–1.24)
GFR, Estimated: 10 mL/min — ABNORMAL LOW (ref >60)
Glucose, Bld: 172 mg/dL — ABNORMAL HIGH (ref 70–99)
Potassium: 4 mmol/L (ref 3.5–5.1)
Sodium: 133 mmol/L — ABNORMAL LOW (ref 135–145)

## 2020-12-19 NOTE — Progress Notes (Signed)
Discussed plan for access with patient. His renal function continues to deteriorate - he may need TDC / AVG this admission. We can arrange this early next week.  Yevonne Aline. Stanford Breed, MD [facility] Phone Number: [phone] 12/19/2020 9:37 AM

## 2020-12-19 NOTE — Plan of Care (Signed)

## 2020-12-19 NOTE — Progress Notes (Signed)
Pt Ruben Camacho [phone]  Noted with ST elevation on leads V and  MCL,per tele monitoring. Pt resting , denies  chest pain nor any discomforts.No changes in V/S noted. Paged Dr Glendell Docker , awaiting  MD to  call back. Continue to monitor pt,  Endorsed to incoming [employer].

## 2020-12-19 NOTE — Progress Notes (Signed)
Ruben Camacho. 10:04 AM  Subjective: Patient seen and examined in hospital computer chart reviewed and has had no further nausea vomiting or signs of bleeding and he tolerated his breakfast without any problems he has not had a previous endoscopy but in 2009 he had a previous colonoscopy and has no other complaints  Objective: Vital signs stable afebrile no acute distress abdomen is soft nontender hemoglobin stable BUN and creatinine continue to rise  Assessment: Seemingly self-limited coffee-ground emesis in patient with multiple medical problems recently on nonsteroidals and blood thinners  Plan: Please let us know if we can be any further assistance with this hospital stay otherwise agree with continuing pump inhibitors and happy to proceed with endoscopy as needed which patient is currently refusing  [facility]  office [phone] After 5PM or if no answer call [phone]

## 2020-12-19 NOTE — Progress Notes (Signed)
PROGRESS NOTE  Ruben Camacho. TO:[id number] DOB: Apr 11, 1956 DOA: 12/14/2020 PCP: Horald Pollen, MD  Brief History   Ruben Camachois a 65 y.o.malewith medical history significant ofHTN, CKD stage IIIB, IIDM,HLD, presented with increasing shortness of breath.  Symptoms started about 10 to 14 days ago, gradually gotten worse, also has had dry cough and then became more productive in the last 2 to 3 days with whitish foamy sputum, no chest pain no fever chills. Overnight unable to lie down, and unable to catch breath even at rest this morning. Then called EMS.  At baseline, check blood pressure every now and then at home and found most occasions SBP>1 70-1 80, no recent BP medication adjustment. Patient reported he has been compliant with his BP meds. EMS arrived, found patient blood pressure more than 200, glucose 320, O2 saturation 88% on room air then stabilized on 3 L via nasal cannula. ED Course:Pulmonary edema, blood pressure 250,Cardene drip started and BP dropped to 140s in 3 hours. Blood work showed AKI on CKD creatinine 5.0 compared to 2.6 baseline, K3.1, glucose 262. Troponin 166. EKG LVH no acute ST-T changes. Chest x-ray pulmonary edema.  Continues to be significantly dyspneic and cough.  Still appears somewhat volume overloaded.  Very dyspneic to speak.  We will increase her diuresis and consult cardiology.  Lasix 80 mg bid started.   Blood pressures improved today.  Nephrology has been consulted. They have consulted vascular surgery. Pt will be evaluated for permanent vascular access today. Vein mapping on 12/17/2020. Patient refused placement of access today.  Nursing has advised me that the patient had an episode of large volume emesis of coffee ground material. Heparin and ASA were stopped. The patient has been started on bid protonix. Hemoglobin will be followed and the patient has been placed on a clear liquid diet. GI was consulted and recommended  endoscopy. The patient has refused. Hemoglobin has dropped 1 gram in the last 24 hours.   Consultants  . Nephrology . Cardiology . Gastroenterology  Procedures  . None  Antibiotics   Anti-infectives (From admission, onward)   Start     Dose/Rate Route Frequency Ordered Stop   12/18/20 0000  ceFAZolin (ANCEF) IVPB 1 g/50 mL premix  Status:  Discontinued       Note to Pharmacy: Send with pt to OR   1 g 100 mL/hr over 30 Minutes Intravenous On call 12/17/20 1511 12/17/20 1513   12/18/20 0000  ceFAZolin (ANCEF) IVPB 2g/100 mL premix       Note to Pharmacy: Send with pt to OR   2 g 200 mL/hr over 30 Minutes Intravenous On call 12/17/20 1513 12/19/20 0000     Interval History/Subjective  The patient is resting in bed. No new complaints.  Objective   Vitals:  Vitals:   12/19/20 0820 12/19/20 1115  BP: (!) 161/65 (!) 148/73  Pulse: 79 70  Resp: 18 18  Temp: 98.1 F (36.7 C) 97.8 F (36.6 C)  SpO2: 96% 94%   Exam:  Constitutional:  . The patient is awake, alert, and oriented x 3. No acute distress.  Respiratory:  . No increased work of breathing. . No wheezes, rales, or rhonchi . No tactile fremitus Cardiovascular:  . Regular rate and rhythm . No murmurs, ectopy, or gallups. . No lateral PMI. No thrills. Abdomen:  . Abdomen is soft, non-tender, non-distended . No hernias, masses, or organomegaly . Normoactive bowel sounds.  Musculoskeletal:  . No cyanosis, clubbing, or edema  Skin:  . No rashes, lesions, ulcers . palpation of skin: no induration or nodules Neurologic:  . CN 2-12 intact . Sensation all 4 extremities intact Psychiatric:  . Mental status o Mood, affect appropriate o Orientation to person, place, time  . judgment and insight appear intact  I have personally reviewed the following:   Today's Data  . Vitals, Hemoglobin and hematocrit, CBC, BMP  Imaging  . CXR  Cardiology Data  . EKG . Echocardiogram  Scheduled Meds: . atorvastatin   40 mg Oral q1800  . citalopram  10 mg Oral Daily  . feeding supplement  237 mL Oral BID BM  . hydrALAZINE  100 mg Oral Q8H  . influenza vac split quadrivalent PF  0.5 mL Intramuscular Tomorrow-1000  . insulin aspart  0-9 Units Subcutaneous TID WC  . isosorbide mononitrate  60 mg Oral Daily  . labetalol  200 mg Oral BID  . melatonin  5 mg Oral QHS  . minoxidil  2.5 mg Oral Daily  .  morphine injection  4 mg Intravenous Once  . mupirocin ointment  1 application Nasal BID  . pantoprazole (PROTONIX) IV  40 mg Intravenous Q12H  . pneumococcal 23 valent vaccine  0.5 mL Intramuscular Tomorrow-1000  . sodium chloride flush  3 mL Intravenous Q12H  . sodium chloride  1 g Oral BID WC   Continuous Infusions: . sodium chloride     Active Problems:   CHF (congestive heart failure) (HCC)   Protein-calorie malnutrition, severe   LOS: 5 days   A & P  Acute on Chronic Combined Sysotlic and Diastolic CHF Decompensation secondary to Hypertensive Emergency  -Discussed with ED physician, appeared thatCardenedrip droppedBP too fast, will discontinue Cardene drip. -BNP on Admission was 3,171.0  -Troponin 166 -> 155 -Continue to monitor BP per Protocol  -Continue Amlodipine 10 mg po Daily, hold his ARB and HCTZ and nifedipine. -Started Hydralazine 10 mg po q8h but will titrate up to 25 mg TID and Imdur 30 mg po Daily  -Lasix 40 mg x 1 yesterday and renewed at 40 mg IV q12h -Strict I's and O's and Daily Weights; Patient is -352 mL -Repeat chest x-ray this AM showed "Cardiomegaly with pulmonary venous congestion again noted. Bilateral pulmonary infiltrates/edema right side greater than left again noted." -Continue to Monitor for S/Sx of Volume Overload Blood pressures are improved.  Acute Respiratory Failure with Hypoxia from Above: Resolved. The patient was saturating at 95-100% on room air for most of the day. The patient is receiving DuoNeb, steroid discontinued. He is receiving Scheduled  Xopenex/Atrovent, Flutter Valve and Incentive Spirometry  Coffee Ground Emesis: Anticoagulation and ASA stopped. Pt placed on clear liquid diet. Protonix bid started. GI consulted and has recommended EGD, but the patient refuses the procedure. Monitor hemoglobin.  Diet advanced to full liquids today.  Hypertension Emergency Blood pressures much better today. The patient is currently receiving hydralazine, imdur, labetalol, and minoxidil  Elevated Troponins -Likely demand ischemia from HTN emergency and CHF decompensation as well as AKI. -No significant ST changes on EKG,  -Troponin I went from 166 -> 155 -Will trend troponins and ordered echocardiogram,  -No chest pain,, will not start ACS meds. -Continue aspirin and statin. -Will Consult Cardiology   AKI onCKD stage IIIb Hyperphosphatemia -In the Setting Fluid overload -Patient's BUN/Cr is now gone from 96/5.03 -> 101/5.03> 101/4.72>120/5/35 -Avoid Nephrotoxic Medications, Contrast Dyes, Hypotension and Renally adjust medications  -Diuresis with caution and repeat chest x-ray  -Continue to Monitor and  Trend Renal Fxn -Nephrology consulted.  -Vascular surgery evaluating patient for permanent HD access soon. Nephrology is planning on temporary dialysis catheter placement on Monday. Pt has refused renal artery duplex.  -Vein mapping today. -Repeat BMP in the AM   Anxiety/Depression: Will start celexa. Will also make low dose ativan available to the patient on an as needed basis.  Elevated D-Dimer -Trending down; D-Dimer went from 4.41 -> 3.15 -> 2.73 -No significant PE risk, likely reflect acute cardio-pulmonary changes -Repeat one reading in AM to determine further image study.  Hyponatremia:  Na 133 this am. Likely due to diuresis and increase Na excretion due to loop diuresis. Will add NaCl 1 g PO bid. Monitor.  Hypokalemia: Resolved.  -K+ was 3.1 and improved to 4.0. -Mag Level was 2.2 -Replete with po KCl 40 mEQ  x1 -Continue to Monitor and Replete as Necessary -Repeat CMP in AM   Microcytic Anemia  -Patient's Hgb/Hct went from 11.7/38.1 -> 11.0/36.1 -Check Anemia Panel in the AM -Continue to Monitor for S/Sx of Bleeding; Currently no overt bleeding noted -Repeat CBC in the AM   IIDMwith Hyperglycemia -Hold oral medications -Started SSI with Sensitive Novolog AC -Check HbA1c in the AM -CBGs ranging from 177-205  I have seen and examined this patient myself. I have spent 42 minutes in his evaluation and care.  DVT prophylaxis: Heparin 5,000 units sq q8h given Renal FXn  Code Status: FULL CODE  Family Communication: I have discussed the patient in detail with the patient's sister, Ruben Camacho. All questions answered to the best of my ability. Disposition Plan: Pending further clinical improvement and clearance by Cardiology   Status is: Inpatient  Remains inpatient appropriate because:Unsafe d/c plan, IV treatments appropriate due to intensity of illness or inability to take PO and Inpatient level of care appropriate due to severity of illness   Dispo: The patient is from: Home  Anticipated d/c is to: TBD  Anticipated d/c date is: 2 days  Patient currently is not medically stable to d/c.              Difficult to place patient No   Anjelina Dung, DO [facility] Direct contact: see [email or web address]  7PM-7AM contact night coverage as above 12/19/2020, 3:47 PM  LOS: 2 days

## 2020-12-19 NOTE — Progress Notes (Signed)
Ruben Camacho   Subjective:   Brief history: This is a 65 year old Ruben Camacho with history of diabetes hypertension which has been malignant times, hyperlipidemia history of stroke, gout stage IV chronic kidney disease with a baseline serum creatinine by 2.05 August 2020  He presented with dyspnea and lower extremity swelling.  He was also found to have a blood pressure was elevated to [id number] systolic.  His blood sugar was 320 and creatinine elevated at 5 mg/dL with chest x-ray which was consistent with pulmonary edema.  There is reported history of nonsteroidal anti-inflammatory drug use.   Urinalysis showed no white blood cells no red blood cells.  There was greater than 300 mg of proteinuria noted on 08/25/2020 however only 100 mg/dL noted on 12/15/2020 Renal ultrasound showed increased bilateral renal echogenicity no hydronephrosis.  Kidney size appears to be small bilaterally.  Interval History: Minimal changes over the past 24 hours.  Patient states that he feels fairly tired.  Minimal nausea.  Blood pressure has improved.  Creatinine continues to rise to 6.1 with elevated BUN.     Objective:  Vital signs in last 24 hours:  Temp:  [97.3 F (36.3 C)-98.1 F (36.7 C)] 98.1 F (36.7 C) (02/19 0820) Pulse Rate:  [63-84] 79 (02/19 0820) Resp:  [15-18] 18 (02/19 0820) BP: (113-161)/(55-78) 161/65 (02/19 0820) SpO2:  [93 %-96 %] 96 % (02/19 0820) Weight:  [71.1 kg] 71.1 kg (02/19 0543)  Weight change: -1.4 kg Filed Weights   12/17/20 0327 12/18/20 0443 12/19/20 0543  Weight: 70.9 kg 72.5 kg 71.1 kg    Intake/Output: I/O last 3 completed shifts: In: 3 [I.V.:3] Out: 900 [Urine:900]   Intake/Output this shift:  Total I/O In: 240 [P.O.:240] Out: -   GEN: Thin, sitting in bed, nad ENT: no nasal discharge, mmm EYES: no scleral icterus, eomi CV: normal rate, regular rhythm PULM: no iwob, bilateral chest rise ABD: NABS, non-distended SKIN: no rashes or  jaundice EXT: Trace edema, warm and well perfused   Basic Metabolic Panel: Recent Labs  Lab 12/15/20 0147 12/15/20 0901 12/16/20 0138 12/17/20 0157 12/18/20 0003 12/19/20 0324  NA 135  --  135 131* 129* 133*  K 3.4*  --  4.0 3.8 3.9 4.0  CL 96*  --  97* 89* 90* 90*  CO2 25  --  '23 24 25 22  '$ GLUCOSE 230*  --  195* 169* 237* 172*  BUN 101*  --  101* 110* 120* 134*  CREATININE 5.03*  --  4.72* 4.98* 5.35* 6.12*  CALCIUM 8.1*  --  8.6* 8.6* 8.5* 8.8*  MG 2.4 2.2 2.3  --   --   --   PHOS  --  5.9* 5.3*  --   --   --     Liver Function Tests: Recent Labs  Lab 12/14/20 0952 12/16/20 0138  AST 30 22  ALT 49* 36  ALKPHOS 84 75  BILITOT 0.9 1.0  PROT 6.5 6.2*  ALBUMIN 3.1* 2.9*   Recent Labs  Lab 12/18/20 0003  LIPASE 67*   No results for input(s): AMMONIA in the last 168 hours.  CBC: Recent Labs  Lab 12/14/20 0952 12/15/20 0901 12/16/20 0138 12/18/20 0003 12/18/20 1100 12/18/20 1827 12/19/20 0324  WBC 9.2 9.8 8.9 8.5  --   --  8.6  NEUTROABS  --  8.3* 7.3  --   --   --  7.4  HGB 11.7* 11.0* 11.5* 11.2* 11.8* 11.8* 11.2*  HCT 38.1* 36.1* 38.4*  36.0* 37.6* 37.8* 35.4*  MCV 76.4* 76.8* 77.4* 74.8*  --   --  75.6*  PLT 200 174 192 246  --   --  252    Cardiac Enzymes: No results for input(s): CKTOTAL, CKMB, CKMBINDEX, TROPONINI in the last 168 hours.  BNP: Invalid input(s): POCBNP  CBG: Recent Labs  Lab 12/18/20 0812 12/18/20 1130 12/18/20 1637 12/18/20 2106 12/19/20 0740  GLUCAP 193* 193* 182* 176* 164*    Microbiology: Results for orders placed or performed during the hospital encounter of 12/14/20  Resp Panel by RT-PCR (Flu A&B, Covid) Nasopharyngeal Swab     Status: None   Collection Time: 12/14/20 11:00 AM   Specimen: Nasopharyngeal Swab; Nasopharyngeal(NP) swabs in vial transport medium  Result Value Ref Range Status   SARS Coronavirus 2 by RT PCR NEGATIVE NEGATIVE Final    Comment: (Camacho) SARS-CoV-2 target nucleic acids are NOT  DETECTED.  The SARS-CoV-2 RNA is generally detectable in upper respiratory specimens during the acute phase of infection. The lowest concentration of SARS-CoV-2 viral copies this assay can detect is 138 copies/mL. A negative result does not preclude SARS-Cov-2 infection and should not be used as the sole basis for treatment or other patient management decisions. A negative result may occur with  improper specimen collection/handling, submission of specimen other than nasopharyngeal swab, presence of viral mutation(s) within the areas targeted by this assay, and inadequate number of viral copies(<138 copies/mL). A negative result must be combined with clinical observations, patient history, and epidemiological information. The expected result is Negative.  Fact Sheet for Patients:  EntrepreneurPulse.com.au  Fact Sheet for Healthcare Providers:  IncredibleEmployment.be  This test is no t yet approved or cleared by the [place] FDA and  has been authorized for detection and/or diagnosis of SARS-CoV-2 by FDA under an Emergency Use Authorization (EUA). This EUA will remain  in effect (meaning this test can be used) for the duration of the COVID-19 declaration under Section 564(b)(1) of the Act, 21 U.S.C.section 360bbb-3(b)(1), unless the authorization is terminated  or revoked sooner.       Influenza A by PCR NEGATIVE NEGATIVE Final   Influenza B by PCR NEGATIVE NEGATIVE Final    Comment: (Camacho) The Xpert Xpress SARS-CoV-2/FLU/RSV plus assay is intended as an aid in the diagnosis of influenza from Nasopharyngeal swab specimens and should not be used as a sole basis for treatment. Nasal washings and aspirates are unacceptable for Xpert Xpress SARS-CoV-2/FLU/RSV testing.  Fact Sheet for Patients: EntrepreneurPulse.com.au  Fact Sheet for Healthcare Providers: IncredibleEmployment.be  This test is not yet  approved or cleared by the [place] FDA and has been authorized for detection and/or diagnosis of SARS-CoV-2 by FDA under an Emergency Use Authorization (EUA). This EUA will remain in effect (meaning this test can be used) for the duration of the COVID-19 declaration under Section 564(b)(1) of the Act, 21 U.S.C. section 360bbb-3(b)(1), unless the authorization is terminated or revoked.  Performed at [facility], [place] [address], [place], [place] [address]   Surgical PCR screen     Status: None   Collection Time: 12/18/20  6:34 AM   Specimen: Nasal Mucosa; Nasal Swab  Result Value Ref Range Status   MRSA, PCR NEGATIVE NEGATIVE Final   Staphylococcus aureus NEGATIVE NEGATIVE Final    Comment: (Camacho) The Xpert SA Assay (FDA approved for NASAL specimens in patients 65 years of age and older), is one component of a comprehensive surveillance program. It is not intended to diagnose infection  nor to guide or monitor treatment. Performed at [facility], [place] [address] [place], [place] [address]     Coagulation Studies: No results for input(s): LABPROT, INR in the last 72 hours.  Urinalysis: No results for input(s): COLORURINE, LABSPEC, PHURINE, GLUCOSEU, HGBUR, BILIRUBINUR, KETONESUR, PROTEINUR, UROBILINOGEN, NITRITE, LEUKOCYTESUR in the last 72 hours.  Invalid input(s): APPERANCEUR    Imaging: DG Abd 1 View  Result Date: 12/18/2020 CLINICAL DATA:  Abdominal pain with nausea vomiting. EXAM: ABDOMEN - 1 VIEW COMPARISON:  None. FINDINGS: The bowel gas pattern is normal. No radio-opaque calculi or other significant radiographic abnormality are seen. IMPRESSION: Negative. Electronically Signed   By: Misty Stanley M.D.   On: 12/18/2020 10:14   VAS [place] UPPER EXT VEIN MAPPING (PRE-OP AVF)  Result Date: 12/17/2020 UPPER EXTREMITY VEIN MAPPING  Indications: Pre-access. History: CKD stage IV/V.  Limitations: Body habitus, patient unable to keep arm extended and rotated.  Comparison Study: No prior study on file Performing Technologist: Sharion Dove RVS  Examination Guidelines: A complete evaluation includes B-mode imaging, spectral Doppler, color Doppler, and power Doppler as needed of all accessible portions of each vessel. Bilateral testing is considered an integral part of a complete examination. Limited examinations for reoccurring indications may be performed as noted. +-----------------+-------------+----------+--------+ Right Cephalic   Diameter (cm)Depth (cm)Findings +-----------------+-------------+----------+--------+ Prox upper arm       0.16        0.34            +-----------------+-------------+----------+--------+ Mid upper arm        0.20        0.31            +-----------------+-------------+----------+--------+ Dist upper arm       0.17        0.36            +-----------------+-------------+----------+--------+ Antecubital fossa    0.22        0.38            +-----------------+-------------+----------+--------+ Prox forearm         0.21        0.45            +-----------------+-------------+----------+--------+ Mid forearm          0.22        0.33            +-----------------+-------------+----------+--------+ Wrist                0.19        0.21            +-----------------+-------------+----------+--------+ +-----------------+-------------+----------+--------+ Left Cephalic    Diameter (cm)Depth (cm)Findings +-----------------+-------------+----------+--------+ Antecubital fossa    0.40        0.37            +-----------------+-------------+----------+--------+ Prox forearm         0.23        0.41            +-----------------+-------------+----------+--------+ Mid forearm          0.21        0.31            +-----------------+-------------+----------+--------+ Wrist                0.19        0.44            +-----------------+-------------+----------+--------+  +-----------------+-------------+----------+--------+ Left Basilic     Diameter (cm)Depth (cm)Findings +-----------------+-------------+----------+--------+ Dist upper arm  0.26        0.67    origin  +-----------------+-------------+----------+--------+ Antecubital fossa    0.24        0.89            +-----------------+-------------+----------+--------+ Prox forearm         0.28        0.32            +-----------------+-------------+----------+--------+ Mid forearm          0.17        0.36            +-----------------+-------------+----------+--------+ Wrist                0.14        0.30            +-----------------+-------------+----------+--------+ *See table(s) above for measurements and observations.  Diagnosing physician: Monica Martinez MD Electronically signed by Monica Martinez MD on 12/17/2020 at 4:30:22 PM.    Final      Medications:   . sodium chloride     . atorvastatin  40 mg Oral q1800  . citalopram  10 mg Oral Daily  . feeding supplement  237 mL Oral BID BM  . hydrALAZINE  100 mg Oral Q8H  . influenza vac split quadrivalent PF  0.5 mL Intramuscular Tomorrow-1000  . insulin aspart  0-9 Units Subcutaneous TID WC  . isosorbide mononitrate  60 mg Oral Daily  . labetalol  200 mg Oral BID  . melatonin  5 mg Oral QHS  . minoxidil  2.5 mg Oral Daily  .  morphine injection  4 mg Intravenous Once  . mupirocin ointment  1 application Nasal BID  . pantoprazole (PROTONIX) IV  40 mg Intravenous Q12H  . pneumococcal 23 valent vaccine  0.5 mL Intramuscular Tomorrow-1000  . sodium chloride flush  3 mL Intravenous Q12H  . sodium chloride  1 g Oral BID WC   sodium chloride, acetaminophen, benzonatate, chlorpheniramine-HYDROcodone, guaiFENesin-dextromethorphan, levalbuterol, LORazepam, ondansetron (ZOFRAN) IV, sodium chloride flush  Assessment/ Plan:  1.AKI on CKD: Creatinine 2.6 in October 2021.  Rapidly progressing kidney loss.  Ultrasound with  small kidneys.  Possible renal artery stenosis based on kidney size.  Awaiting PVL -Continue monitor kidney function daily; there is still some hope for recovery -No urgent need for dialysis at this time, however may need dialysis on Monday if he fails to improve -Consider [facility] and AVG placement if the patient does not improve; appreciate vascular surgery involvement, will talk to them tomorrow if his kidney function is worsening.  Would hope for OR on Monday for [facility] placement. -Monitor urine output for signs of recovery 2. Hypertension/volume  -malignant hypertension which appears to be a theme.    Blood pressure much improved.  Holding Lasix for now given improvement in blood pressure and elevated BUN.  Renal artery PVL given history of recurrent intermittent hypertension with volume overload; awaiting results 3. Anemia  -does have anemia to a certain extent, but not as low as I would expect back for chronically progressive chronic kidney disease.  No action is needed right now 4.  Hypokalemia-resolved with supplementation 5.  A. Fib- could be causing some variable renal perfusion-cardiology is on board 6. Hyponatremia: Sodium 133.  Likely related to kidney disease.  Continue to monitor   LOS: 5 Ruben Camacho '@TODAY''@9'$ :03 AM

## 2020-12-20 ENCOUNTER — Inpatient Hospital Stay (HOSPITAL_COMMUNITY): Payer: 59

## 2020-12-20 DIAGNOSIS — I5021 Acute systolic (congestive) heart failure: Secondary | ICD-10-CM | POA: Diagnosis not present

## 2020-12-20 DIAGNOSIS — N179 Acute kidney failure, unspecified: Secondary | ICD-10-CM | POA: Diagnosis not present

## 2020-12-20 DIAGNOSIS — R9431 Abnormal electrocardiogram [ECG] [EKG]: Secondary | ICD-10-CM | POA: Diagnosis not present

## 2020-12-20 DIAGNOSIS — K920 Hematemesis: Secondary | ICD-10-CM | POA: Diagnosis not present

## 2020-12-20 DIAGNOSIS — E43 Unspecified severe protein-calorie malnutrition: Secondary | ICD-10-CM | POA: Diagnosis not present

## 2020-12-20 LAB — CBC WITH DIFFERENTIAL/PLATELET
Abs Immature Granulocytes: 0.06 10*3/uL (ref 0.00–0.07)
Basophils Absolute: 0 10*3/uL (ref 0.0–0.1)
Basophils Relative: 0 %
Eosinophils Absolute: 0 10*3/uL (ref 0.0–0.5)
Eosinophils Relative: 0 %
HCT: 32.7 % — ABNORMAL LOW (ref 39.0–52.0)
Hemoglobin: 10.3 g/dL — ABNORMAL LOW (ref 13.0–17.0)
Immature Granulocytes: 1 %
Lymphocytes Relative: 8 %
Lymphs Abs: 0.6 10*3/uL — ABNORMAL LOW (ref 0.7–4.0)
MCH: 23.7 pg — ABNORMAL LOW (ref 26.0–34.0)
MCHC: 31.5 g/dL (ref 30.0–36.0)
MCV: 75.2 fL — ABNORMAL LOW (ref 80.0–100.0)
Monocytes Absolute: 0.4 10*3/uL (ref 0.1–1.0)
Monocytes Relative: 4 %
Neutro Abs: 7 10*3/uL (ref 1.7–7.7)
Neutrophils Relative %: 87 %
Platelets: 225 10*3/uL (ref 150–400)
RBC: 4.35 MIL/uL (ref 4.22–5.81)
RDW: 15.9 % — ABNORMAL HIGH (ref 11.5–15.5)
WBC: 8.1 10*3/uL (ref 4.0–10.5)
nRBC: 0 % (ref 0.0–0.2)

## 2020-12-20 LAB — BASIC METABOLIC PANEL
Anion gap: 18 — ABNORMAL HIGH (ref 5–15)
BUN: 140 mg/dL — ABNORMAL HIGH (ref 8–23)
CO2: 21 mmol/L — ABNORMAL LOW (ref 22–32)
Calcium: 8.5 mg/dL — ABNORMAL LOW (ref 8.9–10.3)
Chloride: 91 mmol/L — ABNORMAL LOW (ref 98–111)
Creatinine, Ser: 6.58 mg/dL — ABNORMAL HIGH (ref 0.61–1.24)
GFR, Estimated: 9 mL/min — ABNORMAL LOW (ref >60)
Glucose, Bld: 163 mg/dL — ABNORMAL HIGH (ref 70–99)
Potassium: 4 mmol/L (ref 3.5–5.1)
Sodium: 130 mmol/L — ABNORMAL LOW (ref 135–145)

## 2020-12-20 LAB — HEMOGLOBIN AND HEMATOCRIT, BLOOD
HCT: 32 % — ABNORMAL LOW (ref 39.0–52.0)
HCT: 32.1 % — ABNORMAL LOW (ref 39.0–52.0)
Hemoglobin: 9.9 g/dL — ABNORMAL LOW (ref 13.0–17.0)
Hemoglobin: 10.1 g/dL — ABNORMAL LOW (ref 13.0–17.0)

## 2020-12-20 LAB — GLUCOSE, CAPILLARY
Glucose-Capillary: 149 mg/dL — ABNORMAL HIGH (ref 70–99)
Glucose-Capillary: 166 mg/dL — ABNORMAL HIGH (ref 70–99)
Glucose-Capillary: 164 mg/dL — ABNORMAL HIGH (ref 70–99)
Glucose-Capillary: 181 mg/dL — ABNORMAL HIGH (ref 70–99)
Glucose-Capillary: 171 mg/dL — ABNORMAL HIGH (ref 70–99)

## 2020-12-20 MED ORDER — CHLORHEXIDINE GLUCONATE CLOTH 2 % EX PADS
6.0000 | MEDICATED_PAD | Freq: Every day | CUTANEOUS | Status: DC
  Administered 2020-12-21 – 2021-01-08 (×19): 6 via TOPICAL

## 2020-12-20 MED ORDER — POLYETHYLENE GLYCOL 3350 17 G PO PACK
17.0000 g | PACK | Freq: Every day | ORAL | Status: DC
  Administered 2020-12-20 – 2020-12-29 (×4): 17 g via ORAL
  Filled 2020-12-20 (×8): qty 1

## 2020-12-20 MED ORDER — DOCUSATE SODIUM 100 MG PO CAPS
200.0000 mg | ORAL_CAPSULE | Freq: Once | ORAL | Status: AC
  Administered 2020-12-20: 200 mg via ORAL
  Filled 2020-12-20: qty 2

## 2020-12-20 NOTE — Progress Notes (Signed)
*  PRELIMINARY RESULTS* Echocardiogram 2D Echocardiogram has been performed.  Ruben Camacho 12/20/2020, 5:21 PM

## 2020-12-20 NOTE — Progress Notes (Signed)
PROGRESS NOTE  [name]. [id number] DOB: 10-Sep-1956 DOA: 12/14/2020 PCP: Horald Pollen, MD  Brief History   Neville Route Jr.is a 65 y.o.malewith medical history significant ofHTN, CKD stage IIIB, IIDM,HLD, presented with increasing shortness of breath.  Symptoms started about 10 to 14 days ago, gradually gotten worse, also has had dry cough and then became more productive in the last 2 to 3 days with whitish foamy sputum, no chest pain no fever chills. Overnight unable to lie down, and unable to catch breath even at rest this morning. Then called EMS.  At baseline, check blood pressure every now and then at home and found most occasions SBP>1 70-1 80, no recent BP medication adjustment. Patient reported he has been compliant with his BP meds. EMS arrived, found patient blood pressure more than 200, glucose 320, O2 saturation 88% on room air then stabilized on 3 L via nasal cannula. ED Course:Pulmonary edema, blood pressure 250,Cardene drip started and BP dropped to 140s in 3 hours. Blood work showed AKI on CKD creatinine 5.0 compared to 2.6 baseline, K3.1, glucose 262. Troponin 166. EKG LVH no acute ST-T changes. Chest x-ray pulmonary edema.  Continues to be significantly dyspneic and cough.  Still appears somewhat volume overloaded.  Very dyspneic to speak.  We will increase her diuresis and consult cardiology.  Lasix 80 mg bid started.   Blood pressures improved today.  Nephrology has been consulted. They have consulted vascular surgery. Pt will be evaluated for permanent vascular access today. Vein mapping on 12/17/2020. Patient refused placement of access today.  Nursing has advised me that the patient had an episode of large volume emesis of coffee ground material. Heparin and ASA were stopped. The patient has been started on bid protonix. Hemoglobin will be followed and the patient has been placed on a clear liquid diet. GI was consulted and recommended  endoscopy. The patient has refused. Hemoglobin has dropped 2 gram in the last 48 hours. No further black stools.  Today nursing has notified me that the patient has severe weakness and is unable to sit up. EKG performed that demonstrated new ST depressions in the anterior-lateral leads following admission EKG. Will re-check echocardiogram.  Consultants  . Nephrology . Cardiology . Gastroenterology  Procedures  . None  Antibiotics   Anti-infectives (From admission, onward)   Start     Dose/Rate Route Frequency Ordered Stop   12/18/20 0000  ceFAZolin (ANCEF) IVPB 1 g/50 mL premix  Status:  Discontinued       Note to Pharmacy: Send with pt to OR   1 g 100 mL/hr over 30 Minutes Intravenous On call 12/17/20 1511 12/17/20 1513   12/18/20 0000  ceFAZolin (ANCEF) IVPB 2g/100 mL premix       Note to Pharmacy: Send with pt to OR   2 g 200 mL/hr over 30 Minutes Intravenous On call 12/17/20 1513 12/19/20 0000     Interval History/Subjective  The patient is resting in bed. Severe weakness.  Objective   Vitals:  Vitals:   12/20/20 0505 12/20/20 1315  BP: (!) 142/67 123/70  Pulse: 76   Resp: 18   Temp: 98.4 F (36.9 C)   SpO2: 98%    Exam:  Constitutional:  . The patient is awake, alert, and oriented x 3. No acute distress.  Respiratory:  . No increased work of breathing. . No wheezes, rales, or rhonchi . No tactile fremitus Cardiovascular:  . Regular rate and rhythm . No murmurs, ectopy, or gallups. Marland Kitchen  No lateral PMI. No thrills. Abdomen:  . Abdomen is soft, non-tender, non-distended . No hernias, masses, or organomegaly . Normoactive bowel sounds.  Musculoskeletal:  . No cyanosis, clubbing, or edema Skin:  . No rashes, lesions, ulcers . palpation of skin: no induration or nodules Neurologic:  . CN 2-12 intact . Sensation all 4 extremities intact Psychiatric:  . Mental status o Mood, affect appropriate o Orientation to person, place, time  . judgment and insight  appear intact  I have personally reviewed the following:   Today's Data  . Vitals, Hemoglobin and hematocrit, CBC, BMP  Imaging  . CXR  Cardiology Data  . EKG (12/20/2020) . Echocardiogram (12/14/2020) Repeat pending.  Scheduled Meds: . atorvastatin  40 mg Oral q1800  . Chlorhexidine Gluconate Cloth  6 each Topical Q0600  . citalopram  10 mg Oral Daily  . feeding supplement  237 mL Oral BID BM  . hydrALAZINE  100 mg Oral Q8H  . influenza vac split quadrivalent PF  0.5 mL Intramuscular Tomorrow-1000  . insulin aspart  0-9 Units Subcutaneous TID WC  . isosorbide mononitrate  60 mg Oral Daily  . labetalol  200 mg Oral BID  . melatonin  5 mg Oral QHS  . minoxidil  2.5 mg Oral Daily  .  morphine injection  4 mg Intravenous Once  . mupirocin ointment  1 application Nasal BID  . pantoprazole (PROTONIX) IV  40 mg Intravenous Q12H  . pneumococcal 23 valent vaccine  0.5 mL Intramuscular Tomorrow-1000  . polyethylene glycol  17 g Oral Daily  . sodium chloride flush  3 mL Intravenous Q12H  . sodium chloride  1 g Oral BID WC   Continuous Infusions: . sodium chloride     Active Problems:   CHF (congestive heart failure) (HCC)   Protein-calorie malnutrition, severe   LOS: 6 days   A & P  Acute on Chronic Combined Sysotlic and Diastolic CHF Decompensation secondary to Hypertensive Emergency  -Discussed with ED physician, appeared thatCardenedrip droppedBP too fast, will discontinue Cardene drip. -BNP on Admission was 3,171.0  -Troponin 166 -> 155 -Continue to monitor BP per Protocol  -Continue Amlodipine 10 mg po Daily, hold his ARB and HCTZ and nifedipine. -Started Hydralazine 10 mg po q8h but will titrate up to 25 mg TID and Imdur 30 mg po Daily  -Lasix 40 mg x 1 yesterday and renewed at 40 mg IV q12h -Strict I's and O's and Daily Weights; Patient is -352 mL -Repeat chest x-ray this AM showed "Cardiomegaly with pulmonary venous congestion again noted. Bilateral pulmonary  infiltrates/edema right side greater than left again noted." -Continue to Monitor for S/Sx of Volume Overload Blood pressures are improved. Repeat EKG demonstrated new ST depression in anterior/lateral leads when compared to admission EKG. Will repeate echocardiogram.   Acute Respiratory Failure with Hypoxia from Above: Resolved. The patient was saturating at 95-100% on room air for most of the day. The patient is receiving DuoNeb, steroid discontinued. He is receiving Scheduled Xopenex/Atrovent, Flutter Valve and Incentive Spirometry  Coffee Ground Emesis: Anticoagulation and ASA stopped. Pt placed on clear liquid diet. Protonix bid started. GI consulted and has recommended EGD, but the patient refuses the procedure. Monitor hemoglobin.  Diet advanced to heart healthy. No further melena.  Hypertension Emergency Resolved. Blood pressures much better today. The patient is currently receiving hydralazine, imdur, labetalol, and minoxidil  Elevated Troponins -Likely demand ischemia from HTN emergency and CHF decompensation as well as AKI. -No significant ST changes on  EKG on admission. Today there is new ST segment depression. Recheck echocardiogram.   -No chest pain,, will not start ACS meds. -Continue aspirin and statin.  AKI onCKD stage IIIb Hyperphosphatemia -In the Setting Fluid overload -Patient's BUN/Cr is now gone from 96/5.03 -> 101/5.03> 101/4.72>120/5/35 -Avoid Nephrotoxic Medications, Contrast Dyes, Hypotension and Renally adjust medications  -Diuresis with caution and repeat chest x-ray  -Continue to Monitor and Trend Renal Fxn -Nephrology consulted.  -Vascular surgery evaluating patient for permanent HD access soon. Nephrology is planning on temporary dialysis catheter placement on Monday. Pt has refused renal artery duplex.  -Vein mapping today. -Repeat BMP in the AM   Anxiety/Depression: Will start celexa. Will also make low dose ativan available to the patient on an  as needed basis.  Elevated D-Dimer -Trending down; D-Dimer went from 4.41 -> 3.15 -> 2.73 -No significant PE risk, likely reflect acute cardio-pulmonary changes -Repeat one reading in AM to determine further image study.  Hyponatremia:  Na 133 this am. Likely due to diuresis and increase Na excretion due to loop diuresis. Will add NaCl 1 g PO bid. Monitor.  Hypokalemia: Resolved.  -K+ was 3.1 and improved to 4.0. -Mag Level was 2.2 -Replete with po KCl 40 mEQ x1 -Continue to Monitor and Replete as Necessary -Repeat CMP in AM   Microcytic Anemia  -Patient's Hgb/Hct went from 11.7/38.1 -> 11.0/36.1 -Check Anemia Panel in the AM -Continue to Monitor for S/Sx of Bleeding; Currently no overt bleeding noted -Repeat CBC in the AM   IIDMwith Hyperglycemia -Hold oral medications -Started SSI with Sensitive Novolog AC -Check HbA1c in the AM -CBGs ranging from 177-205  I have seen and examined this patient myself. I have spent 35 minutes in his evaluation and care.  DVT prophylaxis: Heparin 5,000 units sq q8h given Renal FXn  Code Status: FULL CODE  Family Communication: I have discussed the patient in detail with the patient's sister, Stanton Kidney. All questions answered to the best of my ability. Disposition Plan: Pending further clinical improvement and clearance by Cardiology   Status is: Inpatient  Remains inpatient appropriate because:Unsafe d/c plan, IV treatments appropriate due to intensity of illness or inability to take PO and Inpatient level of care appropriate due to severity of illness   Dispo: The patient is from: Home  Anticipated d/c is to: TBD  Anticipated d/c date is: 2 days  Patient currently is not medically stable to d/c.              Difficult to place patient No   Sumire Halbleib, DO [facility] Direct contact: see [email or web address]  7PM-7AM contact night coverage as above 12/20/2020, 2:56 PM  LOS: 2 days

## 2020-12-20 NOTE — Progress Notes (Signed)
   ASSESSMENT & PLAN:  Cabel Jaskot. is a 65 y.o. male with CKDIV rapidly approaching ESRD. Nephrology plans to initiate HD sooner than previously anticipated. Will look for time in the OR tomorrow for [facility] + LUE AVG.   SUBJECTIVE:  Tired. No acute events.  OBJECTIVE:  BP (!) 142/67 (BP Location: Left Arm)   Pulse 76   Temp 98.4 F (36.9 C) (Oral)   Resp 18   Ht '5\' 9"'$  (1.753 m)   Wt 72.4 kg   SpO2 98%   BMI 23.57 kg/m   Intake/Output Summary (Last 24 hours) at 12/20/2020 0911 Last data filed at 12/20/2020 0600 Gross per 24 hour  Intake 703 ml  Output 500 ml  Net 203 ml    2+ L brachial pulse  CBC Latest Ref Rng & Units 12/20/2020 12/19/2020 12/19/2020  WBC 4.0 - 10.5 K/uL 8.1 - -  Hemoglobin 13.0 - 17.0 g/dL 10.3(L) 10.3(L) 10.9(L)  Hematocrit 39.0 - 52.0 % 32.7(L) 31.5(L) 33.5(L)  Platelets 150 - 400 K/uL 225 - -     CMP Latest Ref Rng & Units 12/20/2020 12/19/2020 12/18/2020  Glucose 70 - 99 mg/dL 163(H) 172(H) 237(H)  BUN 8 - 23 mg/dL 140(H) 134(H) 120(H)  Creatinine 0.61 - 1.24 mg/dL 6.58(H) 6.12(H) 5.35(H)  Sodium 135 - 145 mmol/L 130(L) 133(L) 129(L)  Potassium 3.5 - 5.1 mmol/L 4.0 4.0 3.9  Chloride 98 - 111 mmol/L 91(L) 90(L) 90(L)  CO2 22 - 32 mmol/L 21(L) 22 25  Calcium 8.9 - 10.3 mg/dL 8.5(L) 8.8(L) 8.5(L)  Total Protein 6.5 - 8.1 g/dL - - -  Total Bilirubin 0.3 - 1.2 mg/dL - - -  Alkaline Phos 38 - 126 U/L - - -  AST 15 - 41 U/L - - -  ALT 0 - 44 U/L - - -    Estimated Creatinine Clearance: 11.3 mL/min (A) (by C-G formula based on SCr of 6.58 mg/dL (H)).  Yevonne Aline. Stanford Breed, MD [facility] Phone Number: [phone] 12/20/2020 9:11 AM

## 2020-12-20 NOTE — Progress Notes (Signed)
[facility] ROUNDING NOTE   Subjective:   Brief history: This is a 65 year old gentleman with history of diabetes hypertension which has been malignant times, hyperlipidemia history of stroke, gout stage IV chronic kidney disease with a baseline serum creatinine by 2.05 August 2020  He presented with dyspnea and lower extremity swelling.  He was also found to have a blood pressure was elevated to [id number] systolic.  His blood sugar was 320 and creatinine elevated at 5 mg/dL with chest x-ray which was consistent with pulmonary edema.  There is reported history of nonsteroidal anti-inflammatory drug use.   Urinalysis showed no white blood cells no red blood cells.  There was greater than 300 mg of proteinuria noted on 08/25/2020 however only 100 mg/dL noted on 12/15/2020 Renal ultrasound showed increased bilateral renal echogenicity no hydronephrosis.  Kidney size appears to be small bilaterally.  Interval History: Creatinine continues to worsen.  Urine output suboptimal.  Patient continues to feel tired also constipated today.     Objective:  Vital signs in last 24 hours:  Temp:  [97.6 F (36.4 C)-98.4 F (36.9 C)] 98.4 F (36.9 C) (02/20 0505) Pulse Rate:  [70-78] 76 (02/20 0505) Resp:  [18] 18 (02/20 0505) BP: (127-148)/(60-82) 142/67 (02/20 0505) SpO2:  [94 %-98 %] 98 % (02/20 0505) Weight:  [72.4 kg] 72.4 kg (02/20 0505)  Weight change: 1.3 kg Filed Weights   12/18/20 0443 12/19/20 0543 12/20/20 0505  Weight: 72.5 kg 71.1 kg 72.4 kg    Intake/Output: I/O last 3 completed shifts: In: [id number] [P.O.:920; I.V.:23] Out: 1400 [Urine:1400]   Intake/Output this shift:  No intake/output data recorded.  GEN: Thin, sitting in bed, nad ENT: no nasal discharge, mmm EYES: no scleral icterus, eomi CV: normal rate, regular rhythm PULM: no iwob, bilateral chest rise ABD: NABS, non-distended SKIN: no rashes or jaundice EXT: Trace edema, warm and well perfused   Basic  Metabolic Panel: Recent Labs  Lab 12/15/20 0147 12/15/20 0901 12/16/20 0138 12/17/20 0157 12/18/20 0003 12/19/20 0324 12/20/20 0520  NA 135  --  135 131* 129* 133* 130*  K 3.4*  --  4.0 3.8 3.9 4.0 4.0  CL 96*  --  97* 89* 90* 90* 91*  CO2 25  --  '23 24 25 22 '$ 21*  GLUCOSE 230*  --  195* 169* 237* 172* 163*  BUN 101*  --  101* 110* 120* 134* 140*  CREATININE 5.03*  --  4.72* 4.98* 5.35* 6.12* 6.58*  CALCIUM 8.1*  --  8.6* 8.6* 8.5* 8.8* 8.5*  MG 2.4 2.2 2.3  --   --   --   --   PHOS  --  5.9* 5.3*  --   --   --   --     Liver Function Tests: Recent Labs  Lab 12/14/20 0952 12/16/20 0138  AST 30 22  ALT 49* 36  ALKPHOS 84 75  BILITOT 0.9 1.0  PROT 6.5 6.2*  ALBUMIN 3.1* 2.9*   Recent Labs  Lab 12/18/20 0003  LIPASE 67*   No results for input(s): AMMONIA in the last 168 hours.  CBC: Recent Labs  Lab 12/15/20 0901 12/16/20 0138 12/18/20 0003 12/18/20 1100 12/18/20 1827 12/19/20 0324 12/19/20 0955 12/19/20 1739 12/20/20 0520  WBC 9.8 8.9 8.5  --   --  8.6  --   --  8.1  NEUTROABS 8.3* 7.3  --   --   --  7.4  --   --  7.0  HGB 11.0*  11.5* 11.2*   < > 11.8* 11.2* 10.9* 10.3* 10.3*  HCT 36.1* 38.4* 36.0*   < > 37.8* 35.4* 33.5* 31.5* 32.7*  MCV 76.8* 77.4* 74.8*  --   --  75.6*  --   --  75.2*  PLT 174 192 246  --   --  252  --   --  225   < > = values in this interval not displayed.    Cardiac Enzymes: No results for input(s): CKTOTAL, CKMB, CKMBINDEX, TROPONINI in the last 168 hours.  BNP: Invalid input(s): POCBNP  CBG: Recent Labs  Lab 12/19/20 0740 12/19/20 1153 12/19/20 1707 12/19/20 2040 12/20/20 0813  GLUCAP 164* 274* 213* 133* 149*    Microbiology: Results for orders placed or performed during the hospital encounter of 12/14/20  Resp Panel by RT-PCR (Flu A&B, Covid) Nasopharyngeal Swab     Status: None   Collection Time: 12/14/20 11:00 AM   Specimen: Nasopharyngeal Swab; Nasopharyngeal(NP) swabs in vial transport medium  Result Value  Ref Range Status   SARS Coronavirus 2 by RT PCR NEGATIVE NEGATIVE Final    Comment: (NOTE) SARS-CoV-2 target nucleic acids are NOT DETECTED.  The SARS-CoV-2 RNA is generally detectable in upper respiratory specimens during the acute phase of infection. The lowest concentration of SARS-CoV-2 viral copies this assay can detect is 138 copies/mL. A negative result does not preclude SARS-Cov-2 infection and should not be used as the sole basis for treatment or other patient management decisions. A negative result may occur with  improper specimen collection/handling, submission of specimen other than nasopharyngeal swab, presence of viral mutation(s) within the areas targeted by this assay, and inadequate number of viral copies(<138 copies/mL). A negative result must be combined with clinical observations, patient history, and epidemiological information. The expected result is Negative.  Fact Sheet for Patients:  EntrepreneurPulse.com.au  Fact Sheet for Healthcare Providers:  IncredibleEmployment.be  This test is no t yet approved or cleared by the [place] FDA and  has been authorized for detection and/or diagnosis of SARS-CoV-2 by FDA under an Emergency Use Authorization (EUA). This EUA will remain  in effect (meaning this test can be used) for the duration of the COVID-19 declaration under Section 564(b)(1) of the Act, 21 U.S.C.section 360bbb-3(b)(1), unless the authorization is terminated  or revoked sooner.       Influenza A by PCR NEGATIVE NEGATIVE Final   Influenza B by PCR NEGATIVE NEGATIVE Final    Comment: (NOTE) The Xpert Xpress SARS-CoV-2/FLU/RSV plus assay is intended as an aid in the diagnosis of influenza from Nasopharyngeal swab specimens and should not be used as a sole basis for treatment. Nasal washings and aspirates are unacceptable for Xpert Xpress SARS-CoV-2/FLU/RSV testing.  Fact Sheet for  Patients: EntrepreneurPulse.com.au  Fact Sheet for Healthcare Providers: IncredibleEmployment.be  This test is not yet approved or cleared by the [place] FDA and has been authorized for detection and/or diagnosis of SARS-CoV-2 by FDA under an Emergency Use Authorization (EUA). This EUA will remain in effect (meaning this test can be used) for the duration of the COVID-19 declaration under Section 564(b)(1) of the Act, 21 U.S.C. section 360bbb-3(b)(1), unless the authorization is terminated or revoked.  Performed at [facility], [place] [address], [place], [place] [address]   Surgical PCR screen     Status: None   Collection Time: 12/18/20  6:34 AM   Specimen: Nasal Mucosa; Nasal Swab  Result Value Ref Range Status   MRSA, PCR NEGATIVE NEGATIVE Final  Staphylococcus aureus NEGATIVE NEGATIVE Final    Comment: (NOTE) The Xpert SA Assay (FDA approved for NASAL specimens in patients 77 years of age and older), is one component of a comprehensive surveillance program. It is not intended to diagnose infection nor to guide or monitor treatment. Performed at [facility], [place] [address] [place], [place] [address]     Coagulation Studies: No results for input(s): LABPROT, INR in the last 72 hours.  Urinalysis: No results for input(s): COLORURINE, LABSPEC, PHURINE, GLUCOSEU, HGBUR, BILIRUBINUR, KETONESUR, PROTEINUR, UROBILINOGEN, NITRITE, LEUKOCYTESUR in the last 72 hours.  Invalid input(s): APPERANCEUR    Imaging: DG Abd 1 View  Result Date: 12/18/2020 CLINICAL DATA:  Abdominal pain with nausea vomiting. EXAM: ABDOMEN - 1 VIEW COMPARISON:  None. FINDINGS: The bowel gas pattern is normal. No radio-opaque calculi or other significant radiographic abnormality are seen. IMPRESSION: Negative. Electronically Signed   By: Misty Stanley M.D.   On: 12/18/2020 10:14     Medications:   . sodium chloride     . atorvastatin  40 mg  Oral q1800  . citalopram  10 mg Oral Daily  . docusate sodium  200 mg Oral Once  . feeding supplement  237 mL Oral BID BM  . hydrALAZINE  100 mg Oral Q8H  . influenza vac split quadrivalent PF  0.5 mL Intramuscular Tomorrow-1000  . insulin aspart  0-9 Units Subcutaneous TID WC  . isosorbide mononitrate  60 mg Oral Daily  . labetalol  200 mg Oral BID  . melatonin  5 mg Oral QHS  . minoxidil  2.5 mg Oral Daily  .  morphine injection  4 mg Intravenous Once  . mupirocin ointment  1 application Nasal BID  . pantoprazole (PROTONIX) IV  40 mg Intravenous Q12H  . pneumococcal 23 valent vaccine  0.5 mL Intramuscular Tomorrow-1000  . polyethylene glycol  17 g Oral Daily  . sodium chloride flush  3 mL Intravenous Q12H  . sodium chloride  1 g Oral BID WC   sodium chloride, acetaminophen, benzonatate, chlorpheniramine-HYDROcodone, guaiFENesin-dextromethorphan, levalbuterol, LORazepam, ondansetron (ZOFRAN) IV, sodium chloride flush  Assessment/ Plan:  1.AKI on CKD: Creatinine 2.6 in October 2021.  Rapidly progressing kidney loss.  Ultrasound with small kidneys.  Possible renal artery stenosis based on kidney size.  Awaiting PVL -Continue monitor kidney function daily; prospective recovery is diminishing based on trend -No urgent need for dialysis at this time, planning for dialysis on Monday versus Tuesday pending access placement -Discussed with Dr. Stanford Breed today; will help arrange [facility] and graft placement tomorrow greatly appreciate help -Monitor urine output for signs of recovery 2. Hypertension/volume  -malignant hypertension on arrival with flash pulmonary edema intermittently.    Blood pressure much improved.  Holding Lasix for now given improvement in blood pressure and elevated BUN.  Renal artery PVL given history of recurrent intermittent hypertension with volume overload; awaiting results 3. Anemia  -does have anemia to a certain extent, but not as low as I would expect back for chronically  progressive chronic kidney disease.  No action is needed right now 4.  Hypokalemia-resolved with supplementation 5.  A. Fib- could be causing some variable renal perfusion-cardiology is on board 6. Hyponatremia: Sodium 130.  Likely related to free water retention in setting of kidney disease.   LOS: 6 Reesa Chew '@TODAY''@8'$ :55 AM

## 2020-12-20 NOTE — Progress Notes (Signed)
Pt feeling extremely weak and color greyish while on the bedside commode. Dr. Benny Lennert informed. Orthostats unsuccessful due to extreme weakness and unable to sit up very long. EKG performed. Pt denies pain, n,v,d. Just states feeling very weak. Will continue to monitor patient. VS stable at this time.

## 2020-12-20 NOTE — Plan of Care (Signed)

## 2020-12-20 NOTE — H&P (View-Only) (Signed)
   ASSESSMENT & PLAN:  Ruben Camacho. is a 65 y.o. male with CKDIV rapidly approaching ESRD. Nephrology plans to initiate HD sooner than previously anticipated. Will look for time in the OR tomorrow for [facility] + LUE AVG.   SUBJECTIVE:  Tired. No acute events.  OBJECTIVE:  BP (!) 142/67 (BP Location: Left Arm)   Pulse 76   Temp 98.4 F (36.9 C) (Oral)   Resp 18   Ht '5\' 9"'$  (1.753 m)   Wt 72.4 kg   SpO2 98%   BMI 23.57 kg/m   Intake/Output Summary (Last 24 hours) at 12/20/2020 0911 Last data filed at 12/20/2020 0600 Gross per 24 hour  Intake 703 ml  Output 500 ml  Net 203 ml    2+ L brachial pulse  CBC Latest Ref Rng & Units 12/20/2020 12/19/2020 12/19/2020  WBC 4.0 - 10.5 K/uL 8.1 - -  Hemoglobin 13.0 - 17.0 g/dL 10.3(L) 10.3(L) 10.9(L)  Hematocrit 39.0 - 52.0 % 32.7(L) 31.5(L) 33.5(L)  Platelets 150 - 400 K/uL 225 - -     CMP Latest Ref Rng & Units 12/20/2020 12/19/2020 12/18/2020  Glucose 70 - 99 mg/dL 163(H) 172(H) 237(H)  BUN 8 - 23 mg/dL 140(H) 134(H) 120(H)  Creatinine 0.61 - 1.24 mg/dL 6.58(H) 6.12(H) 5.35(H)  Sodium 135 - 145 mmol/L 130(L) 133(L) 129(L)  Potassium 3.5 - 5.1 mmol/L 4.0 4.0 3.9  Chloride 98 - 111 mmol/L 91(L) 90(L) 90(L)  CO2 22 - 32 mmol/L 21(L) 22 25  Calcium 8.9 - 10.3 mg/dL 8.5(L) 8.8(L) 8.5(L)  Total Protein 6.5 - 8.1 g/dL - - -  Total Bilirubin 0.3 - 1.2 mg/dL - - -  Alkaline Phos 38 - 126 U/L - - -  AST 15 - 41 U/L - - -  ALT 0 - 44 U/L - - -    Estimated Creatinine Clearance: 11.3 mL/min (A) (by C-G formula based on SCr of 6.58 mg/dL (H)).  Ruben Camacho. Stanford Breed, MD [facility] Phone Number: [phone] 12/20/2020 9:11 AM

## 2020-12-20 NOTE — Progress Notes (Signed)
PT Cancellation Note  Patient Details Name: Ruben Camacho. MRN: [id number] DOB: [birth year]   Cancelled Treatment:    Reason Eval/Treat Not Completed: Patient declined, no reason specified. Pt declining to participate in PT eval at this time and stated that he would like for PT to return tomorrow to attempt. PT will continue to f/u with pt acutely.    [place] 12/20/2020, 2:31 PM

## 2020-12-21 ENCOUNTER — Inpatient Hospital Stay (HOSPITAL_COMMUNITY): Payer: 59

## 2020-12-21 ENCOUNTER — Inpatient Hospital Stay (HOSPITAL_COMMUNITY): Payer: 59 | Admitting: Anesthesiology

## 2020-12-21 ENCOUNTER — Encounter (HOSPITAL_COMMUNITY): Payer: Self-pay | Admitting: Internal Medicine

## 2020-12-21 ENCOUNTER — Encounter (HOSPITAL_COMMUNITY)
Admission: EM | Disposition: A | Discharge status: Skilled Nursing Facility | Payer: Self-pay | Source: Ambulatory Visit | Attending: Internal Medicine

## 2020-12-21 DIAGNOSIS — N184 Chronic kidney disease, stage 4 (severe): Secondary | ICD-10-CM | POA: Diagnosis not present

## 2020-12-21 DIAGNOSIS — I471 Supraventricular tachycardia: Secondary | ICD-10-CM | POA: Diagnosis not present

## 2020-12-21 DIAGNOSIS — I5021 Acute systolic (congestive) heart failure: Secondary | ICD-10-CM | POA: Diagnosis not present

## 2020-12-21 HISTORY — PX: AV FISTULA PLACEMENT: SHX1204

## 2020-12-21 HISTORY — PX: INSERTION OF DIALYSIS CATHETER: SHX1324

## 2020-12-21 LAB — CBC WITH DIFFERENTIAL/PLATELET
Abs Immature Granulocytes: 0.08 10*3/uL — ABNORMAL HIGH (ref 0.00–0.07)
Basophils Absolute: 0 10*3/uL (ref 0.0–0.1)
Basophils Relative: 0 %
Eosinophils Absolute: 0 10*3/uL (ref 0.0–0.5)
Eosinophils Relative: 0 %
HCT: 29.6 % — ABNORMAL LOW (ref 39.0–52.0)
Hemoglobin: 9.9 g/dL — ABNORMAL LOW (ref 13.0–17.0)
Immature Granulocytes: 1 %
Lymphocytes Relative: 6 %
Lymphs Abs: 0.5 10*3/uL — ABNORMAL LOW (ref 0.7–4.0)
MCH: 24.8 pg — ABNORMAL LOW (ref 26.0–34.0)
MCHC: 33.4 g/dL (ref 30.0–36.0)
MCV: 74.2 fL — ABNORMAL LOW (ref 80.0–100.0)
Monocytes Absolute: 0.4 10*3/uL (ref 0.1–1.0)
Monocytes Relative: 5 %
Neutro Abs: 7.6 10*3/uL (ref 1.7–7.7)
Neutrophils Relative %: 88 %
Platelets: 221 10*3/uL (ref 150–400)
RBC: 3.99 MIL/uL — ABNORMAL LOW (ref 4.22–5.81)
RDW: 15.9 % — ABNORMAL HIGH (ref 11.5–15.5)
WBC: 8.7 10*3/uL (ref 4.0–10.5)
nRBC: 0 % (ref 0.0–0.2)

## 2020-12-21 LAB — IRON AND TIBC
Iron: 67 ug/dL (ref 45–182)
Saturation Ratios: 22 % (ref 17.9–39.5)
TIBC: 298 ug/dL (ref 250–450)
UIBC: 231 ug/dL

## 2020-12-21 LAB — RENAL FUNCTION PANEL
Albumin: 2.8 g/dL — ABNORMAL LOW (ref 3.5–5.0)
Anion gap: 17 — ABNORMAL HIGH (ref 5–15)
BUN: 145 mg/dL — ABNORMAL HIGH (ref 8–23)
CO2: 22 mmol/L (ref 22–32)
Calcium: 8 mg/dL — ABNORMAL LOW (ref 8.9–10.3)
Chloride: 96 mmol/L — ABNORMAL LOW (ref 98–111)
Creatinine, Ser: 7.09 mg/dL — ABNORMAL HIGH (ref 0.61–1.24)
GFR, Estimated: 8 mL/min — ABNORMAL LOW (ref >60)
Glucose, Bld: 166 mg/dL — ABNORMAL HIGH (ref 70–99)
Phosphorus: 10.2 mg/dL — ABNORMAL HIGH (ref 2.5–4.6)
Potassium: 4.1 mmol/L (ref 3.5–5.1)
Sodium: 135 mmol/L (ref 135–145)

## 2020-12-21 LAB — HEMOGLOBIN AND HEMATOCRIT, BLOOD
HCT: 28.6 % — ABNORMAL LOW (ref 39.0–52.0)
HCT: 30.9 % — ABNORMAL LOW (ref 39.0–52.0)
Hemoglobin: 9.2 g/dL — ABNORMAL LOW (ref 13.0–17.0)
Hemoglobin: 9.7 g/dL — ABNORMAL LOW (ref 13.0–17.0)

## 2020-12-21 LAB — ECHOCARDIOGRAM COMPLETE
Area-P 1/2: 3.2 cm2
Height: 69 in
S' Lateral: 2.7 cm
Weight: 2553.81 oz

## 2020-12-21 LAB — GLUCOSE, CAPILLARY
Glucose-Capillary: 143 mg/dL — ABNORMAL HIGH (ref 70–99)
Glucose-Capillary: 160 mg/dL — ABNORMAL HIGH (ref 70–99)
Glucose-Capillary: 147 mg/dL — ABNORMAL HIGH (ref 70–99)
Glucose-Capillary: 132 mg/dL — ABNORMAL HIGH (ref 70–99)
Glucose-Capillary: 276 mg/dL — ABNORMAL HIGH (ref 70–99)

## 2020-12-21 LAB — HEPATITIS B SURFACE ANTIGEN: Hepatitis B Surface Ag: NONREACTIVE

## 2020-12-21 LAB — FERRITIN: Ferritin: 592 ng/mL — ABNORMAL HIGH (ref 24–336)

## 2020-12-21 SURGERY — INSERTION OF ARTERIOVENOUS (AV) GORE-TEX GRAFT ARM
Anesthesia: General | Site: Chest | Laterality: Right

## 2020-12-21 MED ORDER — CEFAZOLIN SODIUM-DEXTROSE 2-3 GM-%(50ML) IV SOLR
INTRAVENOUS | Status: DC | PRN
  Administered 2020-12-21: 2 g via INTRAVENOUS

## 2020-12-21 MED ORDER — PROTAMINE SULFATE 10 MG/ML IV SOLN
INTRAVENOUS | Status: DC | PRN
  Administered 2020-12-21: 40 mg via INTRAVENOUS

## 2020-12-21 MED ORDER — PHENYLEPHRINE HCL-NACL 10-0.9 MG/250ML-% IV SOLN
INTRAVENOUS | Status: DC | PRN
  Administered 2020-12-21: 40 ug/min via INTRAVENOUS

## 2020-12-21 MED ORDER — OXYCODONE-ACETAMINOPHEN 5-325 MG PO TABS
1.0000 | ORAL_TABLET | ORAL | Status: DC | PRN
  Administered 2020-12-21: 1 via ORAL
  Administered 2020-12-22 (×2): 2 via ORAL
  Administered 2020-12-23 (×2): 1 via ORAL
  Administered 2020-12-23 – 2020-12-24 (×2): 2 via ORAL
  Administered 2020-12-31 – 2021-01-07 (×2): 1 via ORAL
  Administered 2021-01-08: 2 via ORAL
  Filled 2020-12-21 (×2): qty 1
  Filled 2020-12-21: qty 2
  Filled 2020-12-21 (×2): qty 1
  Filled 2020-12-21 (×2): qty 2
  Filled 2020-12-21: qty 1
  Filled 2020-12-21 (×2): qty 2

## 2020-12-21 MED ORDER — SODIUM CHLORIDE 0.9 % IV SOLN: INTRAVENOUS | Status: DC

## 2020-12-21 MED ORDER — SODIUM CHLORIDE 0.9 % IV SOLN
INTRAVENOUS | Status: DC | PRN
  Administered 2020-12-21: 500 mL

## 2020-12-21 MED ORDER — CHLORHEXIDINE GLUCONATE 0.12 % MT SOLN
15.0000 mL | Freq: Once | OROMUCOSAL | Status: AC
  Administered 2020-12-21: 15 mL via OROMUCOSAL
  Filled 2020-12-21: qty 15

## 2020-12-21 MED ORDER — 0.9 % SODIUM CHLORIDE (POUR BTL) OPTIME
TOPICAL | Status: DC | PRN
  Administered 2020-12-21: 1000 mL

## 2020-12-21 MED ORDER — PHENYLEPHRINE HCL (PRESSORS) 10 MG/ML IV SOLN
INTRAVENOUS | Status: DC | PRN
  Administered 2020-12-21 (×5): 80 ug via INTRAVENOUS

## 2020-12-21 MED ORDER — LIDOCAINE 2% (20 MG/ML) 5 ML SYRINGE
INTRAMUSCULAR | Status: DC | PRN
  Administered 2020-12-21: 80 mg via INTRAVENOUS

## 2020-12-21 MED ORDER — CHLORHEXIDINE GLUCONATE CLOTH 2 % EX PADS
6.0000 | MEDICATED_PAD | Freq: Every day | CUTANEOUS | Status: DC
  Administered 2020-12-22 – 2020-12-28 (×6): 6 via TOPICAL

## 2020-12-21 MED ORDER — ACETAMINOPHEN 500 MG PO TABS
1000.0000 mg | ORAL_TABLET | Freq: Once | ORAL | Status: AC
  Administered 2020-12-21: 1000 mg via ORAL
  Filled 2020-12-21: qty 2

## 2020-12-21 MED ORDER — HEPARIN SODIUM (PORCINE) 1000 UNIT/ML IJ SOLN
INTRAMUSCULAR | Status: AC
  Administered 2020-12-21: 1000 [IU]
  Filled 2020-12-21: qty 4

## 2020-12-21 MED ORDER — SODIUM CHLORIDE 0.9 % IV SOLN
INTRAVENOUS | Status: AC
  Filled 2020-12-21: qty 1.2

## 2020-12-21 MED ORDER — LIDOCAINE-EPINEPHRINE (PF) 1 %-1:200000 IJ SOLN
INTRAMUSCULAR | Status: AC
  Filled 2020-12-21: qty 30

## 2020-12-21 MED ORDER — FENTANYL CITRATE (PF) 250 MCG/5ML IJ SOLN
INTRAMUSCULAR | Status: AC
  Filled 2020-12-21: qty 5

## 2020-12-21 MED ORDER — HEPARIN SODIUM (PORCINE) 1000 UNIT/ML IJ SOLN
INTRAMUSCULAR | Status: DC | PRN
  Administered 2020-12-21: 7000 [IU] via INTRAVENOUS

## 2020-12-21 MED ORDER — HEPARIN SODIUM (PORCINE) 1000 UNIT/ML IJ SOLN
INTRAMUSCULAR | Status: DC | PRN
  Administered 2020-12-21: 1000 [IU]

## 2020-12-21 MED ORDER — FENTANYL CITRATE (PF) 100 MCG/2ML IJ SOLN: 25.0000 ug | INTRAMUSCULAR | Status: DC | PRN

## 2020-12-21 MED ORDER — ONDANSETRON HCL 4 MG/2ML IJ SOLN: 4.0000 mg | Freq: Once | INTRAMUSCULAR | Status: DC | PRN

## 2020-12-21 MED ORDER — ONDANSETRON HCL 4 MG/2ML IJ SOLN
INTRAMUSCULAR | Status: DC | PRN
  Administered 2020-12-21: 4 mg via INTRAVENOUS

## 2020-12-21 MED ORDER — PANTOPRAZOLE SODIUM 40 MG PO TBEC
40.0000 mg | DELAYED_RELEASE_TABLET | Freq: Two times a day (BID) | ORAL | Status: DC
  Administered 2020-12-21 – 2021-01-08 (×37): 40 mg via ORAL
  Filled 2020-12-21 (×37): qty 1

## 2020-12-21 MED ORDER — ORAL CARE MOUTH RINSE: 15.0000 mL | Freq: Once | OROMUCOSAL | Status: AC

## 2020-12-21 MED ORDER — EPHEDRINE SULFATE 50 MG/ML IJ SOLN
INTRAMUSCULAR | Status: DC | PRN
  Administered 2020-12-21: 10 mg via INTRAVENOUS
  Administered 2020-12-21: 5 mg via INTRAVENOUS
  Administered 2020-12-21: 15 mg via INTRAVENOUS
  Administered 2020-12-21: 5 mg via INTRAVENOUS
  Administered 2020-12-21: 10 mg via INTRAVENOUS
  Administered 2020-12-21: 5 mg via INTRAVENOUS

## 2020-12-21 MED ORDER — PROPOFOL 10 MG/ML IV BOLUS
INTRAVENOUS | Status: DC | PRN
  Administered 2020-12-21: 90 mg via INTRAVENOUS

## 2020-12-21 MED ORDER — FENTANYL CITRATE (PF) 100 MCG/2ML IJ SOLN
INTRAMUSCULAR | Status: DC | PRN
  Administered 2020-12-21: 50 ug via INTRAVENOUS
  Administered 2020-12-21 (×2): 25 ug via INTRAVENOUS

## 2020-12-21 MED ORDER — HEPARIN SODIUM (PORCINE) 1000 UNIT/ML IJ SOLN
INTRAMUSCULAR | Status: AC
  Filled 2020-12-21: qty 1

## 2020-12-21 MED ORDER — LIDOCAINE-EPINEPHRINE (PF) 1 %-1:200000 IJ SOLN
INTRAMUSCULAR | Status: DC | PRN
  Administered 2020-12-21: 17 mL

## 2020-12-21 MED ORDER — PROPOFOL 10 MG/ML IV BOLUS
INTRAVENOUS | Status: AC
  Filled 2020-12-21: qty 20

## 2020-12-21 SURGICAL SUPPLY — 66 items
APL PRP STRL LF DISP 70% ISPRP (MISCELLANEOUS) ×1
ARMBAND PINK RESTRICT EXTREMIT (MISCELLANEOUS) ×6 IMPLANT
BAG DECANTER FOR FLEXI CONT (MISCELLANEOUS) ×4 IMPLANT
BIOPATCH BLUE 3/4IN DISK W/1.5 (GAUZE/BANDAGES/DRESSINGS) ×2 IMPLANT
BIOPATCH RED 1 DISK 7.0 (GAUZE/BANDAGES/DRESSINGS) ×3 IMPLANT
BIOPATCH RED 1IN DISK 7.0MM (GAUZE/BANDAGES/DRESSINGS) ×1
CANISTER SUCT 3000ML PPV (MISCELLANEOUS) ×4 IMPLANT
CANNULA VESSEL 3MM 2 BLNT TIP (CANNULA) ×4 IMPLANT
CATH PALINDROME-P 19CM W/VT (CATHETERS) IMPLANT
CATH PALINDROME-P 23CM W/VT (CATHETERS) ×2 IMPLANT
CATH PALINDROME-P 28CM W/VT (CATHETERS) IMPLANT
CHLORAPREP W/TINT 26 (MISCELLANEOUS) ×4 IMPLANT
CLIP VESOCCLUDE MED 6/CT (CLIP) ×4 IMPLANT
CLIP VESOCCLUDE SM WIDE 6/CT (CLIP) ×4 IMPLANT
COVER LIGHT HANDLE UNIVERSAL (MISCELLANEOUS) ×2 IMPLANT
COVER PROBE W GEL 5X96 (DRAPES) IMPLANT
COVER SURGICAL LIGHT HANDLE (MISCELLANEOUS) ×4 IMPLANT
COVER WAND RF STERILE (DRAPES) ×4 IMPLANT
DECANTER SPIKE VIAL GLASS SM (MISCELLANEOUS) ×4 IMPLANT
DERMABOND ADVANCED (GAUZE/BANDAGES/DRESSINGS) ×4
DERMABOND ADVANCED .7 DNX12 (GAUZE/BANDAGES/DRESSINGS) ×2 IMPLANT
DRAPE C-ARM 42X72 X-RAY (DRAPES) ×4 IMPLANT
DRAPE CHEST BREAST 15X10 FENES (DRAPES) ×4 IMPLANT
ELECT REM PT RETURN 9FT ADLT (ELECTROSURGICAL) ×4
ELECTRODE REM PT RTRN 9FT ADLT (ELECTROSURGICAL) ×2 IMPLANT
GAUZE 4X4 16PLY RFD (DISPOSABLE) ×4 IMPLANT
GAUZE SPONGE 4X4 12PLY STRL (GAUZE/BANDAGES/DRESSINGS) ×2 IMPLANT
GLOVE SRG 8 PF TXTR STRL LF DI (GLOVE) ×2 IMPLANT
GLOVE SURG NEOP MICRO LF SZ7.5 (GLOVE) ×2 IMPLANT
GLOVE SURG POLYISO LF SZ6.5 (GLOVE) ×6 IMPLANT
GLOVE SURG UNDER POLY LF SZ7 (GLOVE) ×2 IMPLANT
GLOVE SURG UNDER POLY LF SZ8 (GLOVE) ×8
GOWN STRL REUS W/ TWL LRG LVL3 (GOWN DISPOSABLE) ×6 IMPLANT
GOWN STRL REUS W/TWL LRG LVL3 (GOWN DISPOSABLE) ×12
GRAFT GORETEX STRT 4-7X45 (Vascular Products) ×2 IMPLANT
KIT BASIN OR (CUSTOM PROCEDURE TRAY) ×4 IMPLANT
KIT MICROPUNCTURE NIT STIFF (SHEATH) ×2 IMPLANT
KIT PALINDROME-P 55CM (CATHETERS) IMPLANT
KIT TURNOVER KIT B (KITS) ×4 IMPLANT
NDL 18GX1X1/2 (RX/OR ONLY) (NEEDLE) ×2 IMPLANT
NDL HYPO 25GX1X1/2 BEV (NEEDLE) ×2 IMPLANT
NEEDLE 18GX1X1/2 (RX/OR ONLY) (NEEDLE) ×4 IMPLANT
NEEDLE HYPO 25GX1X1/2 BEV (NEEDLE) ×4 IMPLANT
NS IRRIG 1000ML POUR BTL (IV SOLUTION) ×4 IMPLANT
PACK CV ACCESS (CUSTOM PROCEDURE TRAY) ×4 IMPLANT
PACK SURGICAL SETUP 50X90 (CUSTOM PROCEDURE TRAY) ×4 IMPLANT
PAD ARMBOARD 7.5X6 YLW CONV (MISCELLANEOUS) ×8 IMPLANT
SPONGE LAP 18X18 RF (DISPOSABLE) ×2 IMPLANT
SPONGE SURGIFOAM ABS GEL 100 (HEMOSTASIS) IMPLANT
SUT ETHILON 3 0 PS 1 (SUTURE) ×4 IMPLANT
SUT PROLENE 5 0 C 1 24 (SUTURE) ×2 IMPLANT
SUT PROLENE 6 0 BV (SUTURE) ×10 IMPLANT
SUT VIC AB 3-0 SH 27 (SUTURE) ×8
SUT VIC AB 3-0 SH 27X BRD (SUTURE) ×4 IMPLANT
SUT VIC AB 4-0 PS2 27 (SUTURE) ×2 IMPLANT
SUT VICRYL 4-0 PS2 18IN ABS (SUTURE) ×8 IMPLANT
SYR 10ML LL (SYRINGE) ×4 IMPLANT
SYR 20ML LL LF (SYRINGE) ×8 IMPLANT
SYR 5ML LL (SYRINGE) ×8 IMPLANT
SYR BULB IRRIG 60ML STRL (SYRINGE) ×2 IMPLANT
SYR CONTROL 10ML LL (SYRINGE) ×4 IMPLANT
SYR TOOMEY 50ML (SYRINGE) IMPLANT
TOWEL GREEN STERILE (TOWEL DISPOSABLE) ×8 IMPLANT
TOWEL GREEN STERILE FF (TOWEL DISPOSABLE) ×4 IMPLANT
UNDERPAD 30X36 HEAVY ABSORB (UNDERPADS AND DIAPERS) ×4 IMPLANT
WATER STERILE IRR 1000ML POUR (IV SOLUTION) ×4 IMPLANT

## 2020-12-21 NOTE — Anesthesia Postprocedure Evaluation (Signed)
Anesthesia Post Note  Patient: Ruben Camacho.  Procedure(s) Performed: INSERTION OF ARTERIOVENOUS (AV) GORE-TEX GRAFT ARM (Left Arm Upper) INSERTION OF TUNNELED  DIALYSIS CATHETER RIGHT INTERNAL JUGULAR (Right Chest)     Patient location during evaluation: PACU Anesthesia Type: General Level of consciousness: awake and alert Pain management: pain level controlled Vital Signs Assessment: post-procedure vital signs reviewed and stable Respiratory status: spontaneous breathing, nonlabored ventilation, respiratory function stable and patient connected to nasal cannula oxygen Cardiovascular status: blood pressure returned to baseline and stable Postop Assessment: no apparent nausea or vomiting Anesthetic complications: no   No complications documented.  Last Vitals:  Vitals:   12/21/20 1530 12/21/20 1600  BP: (!) 141/61 (!) 155/68  Pulse:    Resp: 19 (!) 23  Temp:    SpO2:      Last Pain:  Vitals:   12/21/20 1507  TempSrc: Axillary  PainSc: 0-No pain                 Catalina Gravel

## 2020-12-21 NOTE — Progress Notes (Signed)
PT Cancellation Note  Patient Details Name: Ruben Camacho. MRN: [id number] DOB: 11-15-1955   Cancelled Treatment:    Reason Eval/Treat Not Completed: Patient at procedure or test/unavailable Pt off floor in OR. Will follow up as time allows.   [name] 12/21/2020, 8:56 AM Marisa Severin, PT, DPT Acute Rehabilitation Services Pager [phone] Office [phone]

## 2020-12-21 NOTE — Progress Notes (Addendum)
PROGRESS NOTE  Ruben Camacho. [id number] DOB: 1956-06-27 DOA: 12/14/2020 PCP: Horald Pollen, MD  Brief History   Ruben Camacho a 65 y.o.malewith medical history significant ofHTN, CKD stage IIIB, IIDM,HLD, presented with increasing shortness of breath.  Symptoms started about 10 to 14 days ago, gradually gotten worse, also has had dry cough and then became more productive in the last 2 to 3 days with whitish foamy sputum, no chest pain no fever chills. Overnight unable to lie down, and unable to catch breath even at rest this morning. Then called EMS.  At baseline, check blood pressure every now and then at home and found most occasions SBP>1 70-1 80, no recent BP medication adjustment. Patient reported he has been compliant with his BP meds. EMS arrived, found patient blood pressure more than 200, glucose 320, O2 saturation 88% on room air then stabilized on 3 L via nasal cannula. ED Course:Pulmonary edema, blood pressure 250,Cardene drip started and BP dropped to 140s in 3 hours. Blood work showed AKI on CKD creatinine 5.0 compared to 2.6 baseline, K3.1, glucose 262. Troponin 166. EKG LVH no acute ST-T changes. Chest x-ray pulmonary edema.  Continues to be significantly dyspneic and cough.  Still appears somewhat volume overloaded.  Very dyspneic to speak.  We will increase her diuresis and consult cardiology.  Lasix 80 mg bid started.   Blood pressures improved today.  Nephrology has been consulted. They have consulted vascular surgery. Pt will be evaluated for permanent vascular access today. Vein mapping on 12/17/2020. Patient refused placement of access today.  Nursing has advised me that the patient had an episode of large volume emesis of coffee ground material. Heparin and ASA were stopped. The patient has been started on bid protonix. Hemoglobin will be followed and the patient has been placed on a clear liquid diet. GI was consulted and recommended  endoscopy. The patient has refused. Hemoglobin has dropped 2 gram in the last 48 hours. No further black stools.  Today nursing has notified me that the patient has severe weakness and is unable to sit up. EKG performed that demonstrated new ST depressions in the anterior-lateral leads following admission EKG.   Repeat echocardiogram demonstrated EF of 65-70%. LV has normal function. There are no regional wall motion abnormalities. There is severe left ventricular hypertrophy. There is Grade 1 diastolic dysfunction.   Today the patient has gone for placement of AVG. He has tolerated the procedure well and is seen in HD.  Consultants  . Nephrology . Cardiology . Gastroenterology . Vascular Surgery  Procedures  . Hemodialysis . AVG Placement  Antibiotics   Anti-infectives (From admission, onward)   Start     Dose/Rate Route Frequency Ordered Stop   12/18/20 0000  ceFAZolin (ANCEF) IVPB 1 g/50 mL premix  Status:  Discontinued       Note to Pharmacy: Send with pt to OR   1 g 100 mL/hr over 30 Minutes Intravenous On call 12/17/20 1511 12/17/20 1513   12/18/20 0000  ceFAZolin (ANCEF) IVPB 2g/100 mL premix       Note to Pharmacy: Send with pt to OR   2 g 200 mL/hr over 30 Minutes Intravenous On call 12/17/20 1513 12/19/20 0000     Interval History/Subjective  The patient is resting in bed. No new complaints.  Objective   Vitals:  Vitals:   12/21/20 1530 12/21/20 1600  BP: (!) 141/61 (!) 155/68  Pulse:    Resp: 19 (!) 23  Temp:  SpO2:     Exam:  Constitutional:  . The patient is awake, alert, and oriented x 3. No acute distress.  Respiratory:  . No increased work of breathing. . No wheezes, rales, or rhonchi . No tactile fremitus Cardiovascular:  . Regular rate and rhythm . No murmurs, ectopy, or gallups. . No lateral PMI. No thrills. Abdomen:  . Abdomen is soft, non-tender, non-distended . No hernias, masses, or organomegaly . Normoactive bowel sounds.   Musculoskeletal:  . No cyanosis, clubbing, or edema Skin:  . No rashes, lesions, ulcers . palpation of skin: no induration or nodules Neurologic:  . CN 2-12 intact . Sensation all 4 extremities intact Psychiatric:  . Mental status o Mood, affect appropriate o Orientation to person, place, time  . judgment and insight appear intact  I have personally reviewed the following:   Today's Data  . Vitals, Hemoglobin and hematocrit, CBC, BMP  Imaging  . CXR  Cardiology Data  . EKG (12/20/2020) . Echocardiogram (12/14/2020) and (12/21/2020)  Scheduled Meds: . atorvastatin  40 mg Oral q1800  . Chlorhexidine Gluconate Cloth  6 each Topical Q0600  . citalopram  10 mg Oral Daily  . feeding supplement  237 mL Oral BID BM  . hydrALAZINE  100 mg Oral Q8H  . influenza vac split quadrivalent PF  0.5 mL Intramuscular Tomorrow-1000  . insulin aspart  0-9 Units Subcutaneous TID WC  . isosorbide mononitrate  60 mg Oral Daily  . labetalol  200 mg Oral BID  . melatonin  5 mg Oral QHS  . minoxidil  2.5 mg Oral Daily  . mupirocin ointment  1 application Nasal BID  . pantoprazole  40 mg Oral BID  . pneumococcal 23 valent vaccine  0.5 mL Intramuscular Tomorrow-1000  . polyethylene glycol  17 g Oral Daily  . sodium chloride flush  3 mL Intravenous Q12H  . sodium chloride  1 g Oral BID WC   Continuous Infusions: . 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 -BNP on Admission was 3,171.0  -Troponin 166 -> 155 -Continue to monitor BP per Protocol  -Continue Amlodipine 10 mg po Daily, hold his ARB and HCTZ and nifedipine. -Started Hydralazine 10 mg po q8h but will titrate up to 25 mg TID  and Imdur 30 mg po Daily  -Lasix 40 mg x 1 yesterday and renewed at 40 mg IV q12h -Strict I's and O's and Daily Weights; Patient is -352 mL -Repeat chest x-ray this AM showed "Cardiomegaly with pulmonary venous congestion again noted. Bilateral pulmonary infiltrates/edema right side greater than left again noted." -Continue to Monitor for S/Sx of Volume Overload Blood pressures are improved. Repeat EKG demonstrated new ST depression in anterior/lateral leads when compared to admission EKG. Repeat Echocardiogram is unchanged from previous.  Acute Respiratory Failure with Hypoxia from Above: Resolved. The patient was saturating at 95-100% on room air for most of the day. The patient is receiving DuoNeb, steroid discontinued. He is receiving Scheduled Xopenex/Atrovent, Flutter Valve and Incentive Spirometry  Coffee Ground Emesis: Anticoagulation and ASA stopped. Pt placed on clear liquid diet. Protonix bid started. GI consulted and has recommended EGD, but the patient refuses the procedure. Monitor hemoglobin.  Diet advanced to  heart healthy. No further melena.  Hypertension Emergency Resolved. Blood pressures much better today. The patient is currently receiving hydralazine, imdur, labetalol, and minoxidil  Elevated Troponins -Likely demand ischemia from HTN emergency and CHF decompensation as well as AKI. -No significant ST changes on EKG on admission. Today there is new ST segment depression. Recheck echocardiogram.   -No chest pain,, will not start ACS meds. -Continue aspirin and statin.  AKI onCKD stage IIIb Hyperphosphatemia -In the Setting Fluid overload -Patient's BUN/Cr is now gone from 96/5.03 -> 101/5.03> 101/4.72>120/5/35 -Avoid Nephrotoxic Medications, Contrast Dyes, Hypotension and Renally adjust medications  -Diuresis with caution and repeat chest x-ray  -Continue to Monitor and Trend Renal Fxn -Nephrology consulted.  -Vascular surgery evaluating patient for permanent  HD access soon. Nephrology is planning on temporary dialysis catheter placement on Monday. Pt has refused renal artery duplex.  AVG placed today. The patient has tolerated the procedure well. -Repeat BMP in the AM   Anxiety/Depression: Will start celexa. Will also make low dose ativan available to the patient on an as needed basis.  Elevated D-Dimer -Trending down; D-Dimer went from 4.41 -> 3.15 -> 2.73 -No significant PE risk, likely reflect acute cardio-pulmonary changes -Repeat one reading in AM to determine further image study.  Hyponatremia:  Na 133 this am. Likely due to diuresis and increase Na excretion due to loop diuresis. Will add NaCl 1 g PO bid. Monitor.  Hypokalemia: Resolved.  -K+ was 3.1 and improved to 4.0. -Mag Level was 2.2 -Replete with po KCl 40 mEQ x1 -Continue to Monitor and Replete as Necessary -Repeat CMP in AM   Microcytic Anemia  -Patient's Hgb/Hct went from 11.7/38.1 -> 11.0/36.1 -Check Anemia Panel in the AM -Continue to Monitor for S/Sx of Bleeding; Currently no overt bleeding noted -Repeat CBC in the AM   IIDMwith Hyperglycemia -Hold oral medications -Started SSI with Sensitive Novolog AC -Check HbA1c in the AM -CBGs ranging from 177-205  I have seen and examined this patient myself. I have spent 32 minutes in his evaluation and care.  DVT prophylaxis: Heparin 5,000 units sq q8h given Renal FXn  Code Status: FULL CODE  Family Communication: I have discussed the patient in detail with the patient's sister, Ruben Camacho. All questions answered to the best of my ability. Disposition Plan: Pending further clinical improvement and clearance by Cardiology   Status is: Inpatient  Remains inpatient appropriate because:Unsafe d/c plan, IV treatments appropriate due to intensity of illness or inability to take PO and Inpatient level of care appropriate due to severity of illness   Dispo: The patient is from: Home  Anticipated d/c is  to: TBD  Anticipated d/c date is: 2 days  Patient currently is not medically stable to d/c.              Difficult to place patient No   Ruben Schoch, DO [facility] Direct contact: see [email or web address]  7PM-7AM contact night coverage as above 12/21/2020, 4:38 PM  LOS: 2 days

## 2020-12-21 NOTE — Progress Notes (Signed)
[facility] ROUNDING NOTE   Subjective:   Seen and examined on dialysis.  Blood pressure 141/61 and HR 78.  Tolerating goal.  We reduced to 0.5 kg goal as first tx.  He had RIJ catheter and left arm AV graft placed today with vascular.  Patient had 1 liter UOP over 2/20.    Review of systems:  Denies shortness of breath or chest pain Denies n/v  ---------------- Brief history: This is a 65 year old gentleman with history of diabetes hypertension which has been malignant times, hyperlipidemia history of stroke, gout stage IV chronic kidney disease with a baseline serum creatinine by 2.05 August 2020.   He presented with dyspnea and lower extremity swelling.  He was also found to have a blood pressure was elevated to [id number] systolic.  His blood sugar was 320 and creatinine elevated at 5 mg/dL with chest x-ray which was consistent with pulmonary edema.  There is reported history of nonsteroidal anti-inflammatory drug use. Urinalysis showed no white blood cells no red blood cells.  There was greater than 300 mg of proteinuria noted on 08/25/2020 however only 100 mg/dL noted on 12/15/2020.  Renal ultrasound showed increased bilateral renal echogenicity no hydronephrosis.  Kidney size appears to be small bilaterally.     Objective:  Vital signs in last 24 hours:  Temp:  [97 F (36.1 C)-97.9 F (36.6 C)] 97.6 F (36.4 C) (02/21 1507) Pulse Rate:  [71-81] 75 (02/21 1517) Resp:  [16-21] 19 (02/21 1530) BP: (104-148)/(48-69) 141/61 (02/21 1530) SpO2:  [94 %-100 %] 96 % (02/21 1507) Weight:  [70.4 kg-73.7 kg] 73.7 kg (02/21 1507)  Weight change: -1.956 kg Filed Weights   12/20/20 0505 12/21/20 0413 12/21/20 1507  Weight: 72.4 kg 70.4 kg 73.7 kg    Intake/Output: I/O last 3 completed shifts: In: 1000 [P.O.:1000] Out: 1500 [Urine:1500]   Intake/Output this shift:  Total I/O In: 650 [I.V.:600; IV Piggyback:50] Out: 20 [Blood:20]  GEN: Thin, sitting in bed, nad ENT: no  nasal discharge, mmm EYES: no scleral icterus, eomi CV: normal rate, regular rhythm PULM: no iwob, bilateral chest rise ABD: NABS, non-distended SKIN: no rashes or jaundice EXT: no edema, warm and well perfused Access RIJ tunn catheter; lue AVF bruit and thrill   Basic Metabolic Panel: Recent Labs  Lab 12/15/20 0147 12/15/20 0901 12/16/20 0138 12/17/20 0157 12/18/20 0003 12/19/20 0324 12/20/20 0520  NA 135  --  135 131* 129* 133* 130*  K 3.4*  --  4.0 3.8 3.9 4.0 4.0  CL 96*  --  97* 89* 90* 90* 91*  CO2 25  --  '23 24 25 22 '$ 21*  GLUCOSE 230*  --  195* 169* 237* 172* 163*  BUN 101*  --  101* 110* 120* 134* 140*  CREATININE 5.03*  --  4.72* 4.98* 5.35* 6.12* 6.58*  CALCIUM 8.1*  --  8.6* 8.6* 8.5* 8.8* 8.5*  MG 2.4 2.2 2.3  --   --   --   --   PHOS  --  5.9* 5.3*  --   --   --   --     Liver Function Tests: Recent Labs  Lab 12/16/20 0138  AST 22  ALT 36  ALKPHOS 75  BILITOT 1.0  PROT 6.2*  ALBUMIN 2.9*   Recent Labs  Lab 12/18/20 0003  LIPASE 67*   No results for input(s): AMMONIA in the last 168 hours.  CBC: Recent Labs  Lab 12/15/20 0901 12/16/20 0138 12/18/20 0003 12/18/20 1100 12/19/20  [id number] 12/19/20 0955 12/20/20 0520 12/20/20 0954 12/20/20 1718 12/21/20 0155 12/21/20 1359  WBC 9.8 8.9 8.5  --  8.6  --  8.1  --   --  8.7  --   NEUTROABS 8.3* 7.3  --   --  7.4  --  7.0  --   --  7.6  --   HGB 11.0* 11.5* 11.2*   < > 11.2*   < > 10.3* 9.9* 10.1* 9.9* 9.2*  HCT 36.1* 38.4* 36.0*   < > 35.4*   < > 32.7* 32.0* 32.1* 29.6* 28.6*  MCV 76.8* 77.4* 74.8*  --  75.6*  --  75.2*  --   --  74.2*  --   PLT 174 192 246  --  252  --  225  --   --  221  --    < > = values in this interval not displayed.    Cardiac Enzymes: No results for input(s): CKTOTAL, CKMB, CKMBINDEX, TROPONINI in the last 168 hours.  BNP: Invalid input(s): POCBNP  CBG: Recent Labs  Lab 12/20/20 1622 12/20/20 2138 12/21/20 0732 12/21/20 0948 12/21/20 1231  GLUCAP 181* 171*  143* 160* 147*    Microbiology: Results for orders placed or performed during the hospital encounter of 12/14/20  Resp Panel by RT-PCR (Flu A&B, Covid) Nasopharyngeal Swab     Status: None   Collection Time: 12/14/20 11:00 AM   Specimen: Nasopharyngeal Swab; Nasopharyngeal(NP) swabs in vial transport medium  Result Value Ref Range Status   SARS Coronavirus 2 by RT PCR NEGATIVE NEGATIVE Final    Comment: (NOTE) SARS-CoV-2 target nucleic acids are NOT DETECTED.  The SARS-CoV-2 RNA is generally detectable in upper respiratory specimens during the acute phase of infection. The lowest concentration of SARS-CoV-2 viral copies this assay can detect is 138 copies/mL. A negative result does not preclude SARS-Cov-2 infection and should not be used as the sole basis for treatment or other patient management decisions. A negative result may occur with  improper specimen collection/handling, submission of specimen other than nasopharyngeal swab, presence of viral mutation(s) within the areas targeted by this assay, and inadequate number of viral copies(<138 copies/mL). A negative result must be combined with clinical observations, patient history, and epidemiological information. The expected result is Negative.  Fact Sheet for Patients:  EntrepreneurPulse.com.au  Fact Sheet for Healthcare Providers:  IncredibleEmployment.be  This test is no t yet approved or cleared by the [place] FDA and  has been authorized for detection and/or diagnosis of SARS-CoV-2 by FDA under an Emergency Use Authorization (EUA). This EUA will remain  in effect (meaning this test can be used) for the duration of the COVID-19 declaration under Section 564(b)(1) of the Act, 21 U.S.C.section 360bbb-3(b)(1), unless the authorization is terminated  or revoked sooner.       Influenza A by PCR NEGATIVE NEGATIVE Final   Influenza B by PCR NEGATIVE NEGATIVE Final    Comment:  (NOTE) The Xpert Xpress SARS-CoV-2/FLU/RSV plus assay is intended as an aid in the diagnosis of influenza from Nasopharyngeal swab specimens and should not be used as a sole basis for treatment. Nasal washings and aspirates are unacceptable for Xpert Xpress SARS-CoV-2/FLU/RSV testing.  Fact Sheet for Patients: EntrepreneurPulse.com.au  Fact Sheet for Healthcare Providers: IncredibleEmployment.be  This test is not yet approved or cleared by the [place] FDA and has been authorized for detection and/or diagnosis of SARS-CoV-2 by FDA under an Emergency Use Authorization (EUA). This EUA will remain in effect (meaning  this test can be used) for the duration of the COVID-19 declaration under Section 564(b)(1) of the Act, 21 U.S.C. section 360bbb-3(b)(1), unless the authorization is terminated or revoked.  Performed at [facility], [place] [address] [place], [place] [address]   Surgical PCR screen     Status: None   Collection Time: 12/18/20  6:34 AM   Specimen: Nasal Mucosa; Nasal Swab  Result Value Ref Range Status   MRSA, PCR NEGATIVE NEGATIVE Final   Staphylococcus aureus NEGATIVE NEGATIVE Final    Comment: (NOTE) The Xpert SA Assay (FDA approved for NASAL specimens in patients 54 years of age and older), is one component of a comprehensive surveillance program. It is not intended to diagnose infection nor to guide or monitor treatment. Performed at [facility], [place] [address] [place], [place] [address]     Coagulation Studies: No results for input(s): LABPROT, INR in the last 72 hours.  Urinalysis: No results for input(s): COLORURINE, LABSPEC, PHURINE, GLUCOSEU, HGBUR, BILIRUBINUR, KETONESUR, PROTEINUR, UROBILINOGEN, NITRITE, LEUKOCYTESUR in the last 72 hours.  Invalid input(s): APPERANCEUR    Imaging: DG Chest Port 1 View  Result Date: 12/21/2020 CLINICAL DATA:  End-stage renal disease. Dialysis catheter  placement. EXAM: PORTABLE CHEST 1 VIEW COMPARISON:  12/16/2020 FINDINGS: A right jugular catheter has been placed and terminates near the superior cavoatrial junction. The cardiac silhouette remains mildly enlarged. There is persistent elevation of the right hemidiaphragm. Patchy bilateral airspace disease, greatest in the lung bases, has not significantly changed. No sizable pleural effusion or pneumothorax is identified. IMPRESSION: 1. Interval right jugular catheter placement as above. 2. Unchanged bilateral airspace disease. Electronically Signed   By: Logan Bores M.D.   On: 12/21/2020 12:54   DG Fluoro Guide CV Line-No Report  Result Date: 12/21/2020 Fluoroscopy was utilized by the requesting physician.  No radiographic interpretation.   ECHOCARDIOGRAM COMPLETE  Result Date: 12/21/2020    ECHOCARDIOGRAM REPORT   Patient Name:   Ruben Camacho. Date of Exam: 12/20/2020 Medical Rec #:  [id number]          Height:       69.0 in Accession #:    [id number]         Weight:       159.6 lb Date of Birth:  10/23/1956          BSA:          1.877 m Patient Age:    18 years           BP:           123/70 mmHg Patient Gender: M                  HR:           76 bpm. Exam Location:  Inpatient Procedure: 2D Echo, Cardiac Doppler and Color Doppler Indications:    Abnormal ECG R94.31  History:        Patient has prior history of Echocardiogram examinations, most                 recent 12/14/2020. CHF, Stroke; Risk Factors:Hypertension,                 Dyslipidemia and Diabetes.  Sonographer:    Alvino Chapel RCS Referring Phys: [phone] AVA SWAYZE IMPRESSIONS  1. Compared to echo from 12/14/20 LVEF has improved.  2. Left ventricular ejection fraction, by estimation, is 65 to 70%. The left ventricle has normal function.  The left ventricle has no regional wall motion abnormalities. There is severe left ventricular hypertrophy. Left ventricular diastolic parameters  are consistent with Grade I diastolic dysfunction (impaired  relaxation).  3. Right ventricular systolic function is normal. The right ventricular size is normal.  4. Left atrial size was mildly dilated.  5. The mitral valve is normal in structure. Mild mitral valve regurgitation.  6. The aortic valve is normal in structure. Aortic valve regurgitation is not visualized.  7. The inferior vena cava is normal in size with greater than 50% respiratory variability, suggesting right atrial pressure of 3 mmHg. FINDINGS  Left Ventricle: Left ventricular ejection fraction, by estimation, is 65 to 70%. The left ventricle has normal function. The left ventricle has no regional wall motion abnormalities. The left ventricular internal cavity size was normal in size. There is  severe left ventricular hypertrophy. Left ventricular diastolic parameters are consistent with Grade I diastolic dysfunction (impaired relaxation). Right Ventricle: The right ventricular size is normal. Right vetricular wall thickness was not assessed. Right ventricular systolic function is normal. Left Atrium: Left atrial size was mildly dilated. Right Atrium: Right atrial size was normal in size. Pericardium: Trivial pericardial effusion is present. Mitral Valve: The mitral valve is normal in structure. Mild mitral valve regurgitation. Tricuspid Valve: The tricuspid valve is normal in structure. Tricuspid valve regurgitation is trivial. Aortic Valve: The aortic valve is normal in structure. Aortic valve regurgitation is not visualized. Pulmonic Valve: The pulmonic valve was normal in structure. Pulmonic valve regurgitation is not visualized. Aorta: The aortic root and ascending aorta are structurally normal, with no evidence of dilitation. Venous: The inferior vena cava is normal in size with greater than 50% respiratory variability, suggesting right atrial pressure of 3 mmHg. IAS/Shunts: The interatrial septum was not assessed.  LEFT VENTRICLE PLAX 2D LVIDd:         4.20 cm  Diastology LVIDs:         2.70 cm  LV  e' medial:    5.44 cm/s LV PW:         1.80 cm  LV E/e' medial:  15.9 LV IVS:        1.50 cm  LV e' lateral:   7.07 cm/s LVOT diam:     1.90 cm  LV E/e' lateral: 12.2 LV SV:         64 LV SV Index:   34 LVOT Area:     2.84 cm  RIGHT VENTRICLE RV S prime:     21.90 cm/s TAPSE (M-mode): 2.3 cm LEFT ATRIUM             Index       RIGHT ATRIUM           Index LA diam:        4.10 cm 2.18 cm/m  RA Area:     19.00 cm LA Vol (A2C):   70.7 ml 37.66 ml/m RA Volume:   50.50 ml  26.90 ml/m LA Vol (A4C):   67.4 ml 35.91 ml/m LA Biplane Vol: 71.2 ml 37.93 ml/m  AORTIC VALVE LVOT Vmax:   138.00 cm/s LVOT Vmean:  95.800 cm/s LVOT VTI:    0.225 m  AORTA Ao Root diam: 3.20 cm MITRAL VALVE MV Area (PHT): 3.20 cm    SHUNTS MV Decel Time: 237 msec    Systemic VTI:  0.22 m MV E velocity: 86.50 cm/s  Systemic Diam: 1.90 cm MV A velocity: 92.50 cm/s MV E/A ratio:  0.94 Nevin Bloodgood  Harrington Challenger MD Electronically signed by Dorris Carnes MD Signature Date/Time: 12/21/2020/1:19:23 PM    Final      Medications:   . sodium chloride     . atorvastatin  40 mg Oral q1800  . Chlorhexidine Gluconate Cloth  6 each Topical Q0600  . citalopram  10 mg Oral Daily  . feeding supplement  237 mL Oral BID BM  . hydrALAZINE  100 mg Oral Q8H  . influenza vac split quadrivalent PF  0.5 mL Intramuscular Tomorrow-1000  . insulin aspart  0-9 Units Subcutaneous TID WC  . isosorbide mononitrate  60 mg Oral Daily  . labetalol  200 mg Oral BID  . melatonin  5 mg Oral QHS  . minoxidil  2.5 mg Oral Daily  .  morphine injection  4 mg Intravenous Once  . mupirocin ointment  1 application Nasal BID  . pantoprazole  40 mg Oral BID  . pneumococcal 23 valent vaccine  0.5 mL Intramuscular Tomorrow-1000  . polyethylene glycol  17 g Oral Daily  . sodium chloride flush  3 mL Intravenous Q12H  . sodium chloride  1 g Oral BID WC   sodium chloride, acetaminophen, benzonatate, chlorpheniramine-HYDROcodone, guaiFENesin-dextromethorphan, levalbuterol, LORazepam,  ondansetron (ZOFRAN) IV, oxyCODONE-acetaminophen, sodium chloride flush  Assessment/ Plan:  1.AKI on CKD: Creatinine 2.6 in October 2021.  Rapidly progressing kidney loss.  Ultrasound with small kidneys.  Possible renal artery stenosis based on kidney size.  Awaiting PVL.  RIJ tunn catheter and LUE avf on 2/21 with vascular.   - HD today and for 2/22 as well for clearance -Monitor urine output for signs of recovery 2. Hypertension/volume  -malignant hypertension on arrival with flash pulmonary edema intermittently.  Blood pressure much improved.  Renal artery duplex given history of recurrent intermittent hypertension with volume overload; awaiting results  3. Anemia CKD - check iron stores and anticipate need for ESA 4.  Hypokalemia-resolved with supplementation 5.  A. Fib- could be causing some variable renal perfusion-cardiology is on board 6. Hyponatremia: resolved. Likely related to free water retention in setting of kidney disease.   Claudia Desanctis, MD 12/21/2020 4:05 PM

## 2020-12-21 NOTE — Transfer of Care (Signed)
Immediate Anesthesia Transfer of Care Note  Patient: Ruben Camacho.  Procedure(s) Performed: INSERTION OF ARTERIOVENOUS (AV) GORE-TEX GRAFT ARM (Left Arm Upper) INSERTION OF TUNNELED  DIALYSIS CATHETER RIGHT INTERNAL JUGULAR (Right Chest)  Patient Location: PACU  Anesthesia Type:General  Level of Consciousness: responds to stimulation  Airway & Oxygen Therapy: Patient Spontanous Breathing and Patient connected to face mask oxygen  Post-op Assessment: Report given to RN and Post -op Vital signs reviewed and stable  Post vital signs: Reviewed and stable  Last Vitals:  Vitals Value Taken Time  BP 121/60 12/21/20 1231  Temp    Pulse 73 12/21/20 1231  Resp 25 12/21/20 1231  SpO2 99 % 12/21/20 1231  Vitals shown include unvalidated device data.  Last Pain:  Vitals:   12/21/20 0413  TempSrc: Oral  PainSc:       Patients Stated Pain Goal: 0 ([id number])  Complications: No complications documented.

## 2020-12-21 NOTE — Op Note (Signed)
    NAME: Drago Mckendrick.    MRN: [id number] DOB: 03/10/1956    DATE OF OPERATION: 12/21/2020  PREOP DIAGNOSIS:    Stage V chronic kidney disease  POSTOP DIAGNOSIS:    Same  PROCEDURE:    1.  Ultrasound-guided placement of 23 cm tunneled dialysis catheter 2.  New left upper arm AV graft (4-7 mm PTFE graft)  SURGEON: Judeth Cornfield. Scot Dock, MD  ASSIST: Graylon Good, RNFA  ANESTHESIA: General  EBL: Minimal  INDICATIONS:    Ruben Camacho. is a 65 y.o. male who is to begin dialysis.  He presents for new access.  Based on his vein map he was not a candidate for a fistula.  FINDINGS:   Excellent thrill in his upper arm graft at the completion of the procedure.  TECHNIQUE:   The patient was taken to the operating room and received a general anesthetic.  The neck and upper chest were prepped and draped in usual sterile fashion.  Under ultrasound guidance, after the skin was anesthetized, I cannulated the right IJ with a micropuncture needle and a micropuncture sheath was introduced over a wire.  I then advanced the J-wire into the right atrium.  The exit site for the catheter was selected and the catheter was brought through the tunnel.  This was a 23 cm catheter.  The tract over the wire was dilated and the dilator and peel-away sheath were advanced over the wire and the wire and dilator removed.  The catheter was passed through the peel-away sheath and positioned in the right atrium.  Both ports withdrew easily the catheter was flushed with heparinized saline and filled with concentrated heparin.  It was secured at its exit site with a 3-0 nylon suture.  The IJ cannulation site was closed with a 4-0 subcuticular stitch.  Sterile dressing was applied.  Attention was then turned to the left arm.  The left arm was prepped and draped in usual sterile fashion.  After the skin was anesthetized a longitudinal incision was made just above the antecubital level.  The brachial artery was  dissected free.  A separate longitudinal incision was made beneath the axilla and the high brachial vein dissected free.  A 4-7 mm PTFE graft then tunneled between the 2 incisions and the patient was heparinized.  The brachial artery was clamped proximally and distally and a longitudinal arteriotomy was made.  A segment of the 4 mm of the graft was excised, the graft spatulated and sewn end-to-side to the artery using continuous 6-0 Prolene suture.  The graft then pulled the appropriate length for anastomosis to the high brachial vein.  The vein was ligated distally and spatulated proximally.  The graft was spatulated cut to the appropriate length and sewn into into the vein using 2 continuous 6-0 Prolene sutures.  At the completion there was an excellent thrill in the graft.  The heparin was partially reversed with protamine.  The wounds were closed with a deep layer of 3-0 Vicryl the skin closed with 4-0 Vicryl.  Dermabond was applied.  The patient tolerated the procedure well was transferred to the recovery room in stable condition.  All needle and sponge counts were correct.  Given the complexity of the case a first assistant was necessary in order to expedient the procedure and safely perform the technical aspects of the operation.  Deitra Mayo, MD, FACS [facility]  DATE OF DICTATION:   12/21/2020

## 2020-12-21 NOTE — Interval H&P Note (Signed)
History and Physical Interval Note:  12/21/2020 7:19 AM  Ruben Camacho.  has presented today for surgery, with the diagnosis of CHRONIC KIDNEY DISEASE.  The various methods of treatment have been discussed with the patient and family. After consideration of risks, benefits and other options for treatment, the patient has consented to  Procedure(s): INSERTION OF ARTERIOVENOUS (AV) GORE-TEX GRAFT ARM (Left) INSERTION OFTUNNELED  DIALYSIS CATHETER (Left) as a surgical intervention.  The patient's history has been reviewed, patient examined, no change in status, stable for surgery.  I have reviewed the patient's chart and labs.  Questions were answered to the patient's satisfaction.     Deitra Mayo

## 2020-12-21 NOTE — Anesthesia Preprocedure Evaluation (Signed)
Anesthesia Evaluation  Patient identified by MRN, date of birth, ID band Patient awake    Reviewed: Allergy & Precautions, NPO status , Patient's Chart, lab work & pertinent test results  Airway Mallampati: II  TM Distance: >3 FB Neck ROM: Full    Dental  (+) Teeth Intact, Dental Advisory Given   Pulmonary neg pulmonary ROS,    Pulmonary exam normal breath sounds clear to auscultation       Cardiovascular hypertension, Pt. on medications +CHF  Normal cardiovascular exam Rhythm:Regular Rate:Normal  Echo 12/14/20: 1. Left ventricular ejection fraction, by estimation, is 40%. The left ventricle has mild to moderately decreased function. The left ventricle demonstrates global hypokinesis. There is mild left ventricular hypertrophy. Left ventricular diastolic parameters are consistent with Grade II diastolic dysfunction (pseudonormalization).  2. Right ventricular systolic function is normal. The right ventricular size is normal. There is normal pulmonary artery systolic pressure. The estimated right ventricular systolic pressure is 123XX123 mmHg.  3. Left atrial size was moderately dilated.  4. The mitral valve is abnormal. Moderate mitral valve regurgitation. No evidence of mitral stenosis.  5. The aortic valve is tricuspid. Aortic valve regurgitation is not visualized. No aortic stenosis is present.  6. The inferior vena cava is normal in size with greater than 50% respiratory variability, suggesting right atrial pressure of 3 mmHg.    Neuro/Psych  Headaches, CVA, Residual Symptoms negative psych ROS   GI/Hepatic negative GI ROS, Neg liver ROS,   Endo/Other  diabetes, Type 2, Oral Hypoglycemic Agents  Renal/GU Renal InsufficiencyRenal disease     Musculoskeletal negative musculoskeletal ROS (+)   Abdominal   Peds  Hematology  (+) Blood dyscrasia, anemia ,   Anesthesia Other Findings Day of surgery medications reviewed  with the patient.  Reproductive/Obstetrics                             Anesthesia Physical Anesthesia Plan  ASA: III  Anesthesia Plan: General   Post-op Pain Management:    Induction: Intravenous  PONV Risk Score and Plan: 2 and Dexamethasone and Ondansetron  Airway Management Planned: LMA  Additional Equipment:   Intra-op Plan:   Post-operative Plan: Extubation in OR  Informed Consent: I have reviewed the patients History and Physical, chart, labs and discussed the procedure including the risks, benefits and alternatives for the proposed anesthesia with the patient or authorized representative who has indicated his/her understanding and acceptance.     Dental advisory given  Plan Discussed with: CRNA  Anesthesia Plan Comments:         Anesthesia Quick Evaluation

## 2020-12-21 NOTE — Progress Notes (Signed)
OT Cancellation Note  Patient Details Name: Ruben Camacho. MRN: [id number] DOB: 05-19-1956   Cancelled Treatment:    Reason Eval/Treat Not Completed: Patient at procedure or test/ unavailable (pt in surgery)  [name] 12/21/2020, 8:34 AM  [name], OTR/L Acute Rehabilitation Services Pager: [phone] Office: [phone]

## 2020-12-21 NOTE — Anesthesia Procedure Notes (Signed)
Procedure Name: LMA Insertion Date/Time: 12/21/2020 9:59 AM Performed by: Babs Bertin, CRNA Pre-anesthesia Checklist: Patient identified, Emergency Drugs available, Suction available and Patient being monitored Patient Re-evaluated:Patient Re-evaluated prior to induction Oxygen Delivery Method: Circle System Utilized Preoxygenation: Pre-oxygenation with 100% oxygen Induction Type: IV induction Ventilation: Mask ventilation without difficulty LMA: LMA inserted LMA Size: 4.0 Number of attempts: 1 Airway Equipment and Method: Bite block Placement Confirmation: positive ETCO2 Tube secured with: Tape Dental Injury: Teeth and Oropharynx as per pre-operative assessment

## 2020-12-22 ENCOUNTER — Encounter (HOSPITAL_COMMUNITY): Payer: Self-pay | Admitting: Vascular Surgery

## 2020-12-22 DIAGNOSIS — N179 Acute kidney failure, unspecified: Secondary | ICD-10-CM | POA: Diagnosis not present

## 2020-12-22 DIAGNOSIS — N184 Chronic kidney disease, stage 4 (severe): Secondary | ICD-10-CM

## 2020-12-22 DIAGNOSIS — I5033 Acute on chronic diastolic (congestive) heart failure: Secondary | ICD-10-CM | POA: Diagnosis not present

## 2020-12-22 LAB — HEMOGLOBIN AND HEMATOCRIT, BLOOD
HCT: 27.9 % — ABNORMAL LOW (ref 39.0–52.0)
HCT: 28.7 % — ABNORMAL LOW (ref 39.0–52.0)
HCT: 28.2 % — ABNORMAL LOW (ref 39.0–52.0)
Hemoglobin: 8.9 g/dL — ABNORMAL LOW (ref 13.0–17.0)
Hemoglobin: 8.8 g/dL — ABNORMAL LOW (ref 13.0–17.0)
Hemoglobin: 8.6 g/dL — ABNORMAL LOW (ref 13.0–17.0)

## 2020-12-22 LAB — HEPATITIS B SURFACE ANTIBODY, QUANTITATIVE: Hep B S AB Quant (Post): <3.1 m[IU]/mL — ABNORMAL LOW (ref >9.9)

## 2020-12-22 LAB — GLUCOSE, CAPILLARY
Glucose-Capillary: 146 mg/dL — ABNORMAL HIGH (ref 70–99)
Glucose-Capillary: 244 mg/dL — ABNORMAL HIGH (ref 70–99)
Glucose-Capillary: 160 mg/dL — ABNORMAL HIGH (ref 70–99)
Glucose-Capillary: 138 mg/dL — ABNORMAL HIGH (ref 70–99)

## 2020-12-22 MED ORDER — RENA-VITE PO TABS
1.0000 | ORAL_TABLET | Freq: Every day | ORAL | Status: DC
  Administered 2020-12-22 – 2021-01-08 (×18): 1 via ORAL
  Filled 2020-12-22 (×18): qty 1

## 2020-12-22 MED ORDER — NEPRO/CARBSTEADY PO LIQD
237.0000 mL | Freq: Two times a day (BID) | ORAL | Status: DC
  Administered 2020-12-23 – 2020-12-30 (×10): 237 mL via ORAL

## 2020-12-22 MED ORDER — SODIUM CHLORIDE 0.9 % IV SOLN: 510.0000 mg | Freq: Once | INTRAVENOUS | Status: DC

## 2020-12-22 MED ORDER — DARBEPOETIN ALFA 40 MCG/0.4ML IJ SOSY
40.0000 ug | PREFILLED_SYRINGE | Freq: Once | INTRAMUSCULAR | Status: AC
  Administered 2020-12-22: 40 ug via SUBCUTANEOUS
  Filled 2020-12-22: qty 0.4

## 2020-12-22 MED ORDER — SODIUM CHLORIDE 0.9 % IV SOLN
510.0000 mg | Freq: Once | INTRAVENOUS | Status: AC
  Administered 2020-12-22: 510 mg via INTRAVENOUS
  Filled 2020-12-22: qty 17

## 2020-12-22 MED ORDER — SEVELAMER CARBONATE 800 MG PO TABS
800.0000 mg | ORAL_TABLET | Freq: Three times a day (TID) | ORAL | Status: DC
  Administered 2020-12-22 – 2021-01-04 (×33): 800 mg via ORAL
  Filled 2020-12-22 (×34): qty 1

## 2020-12-22 MED ORDER — DARBEPOETIN ALFA 40 MCG/0.4ML IJ SOSY: 40.0000 ug | PREFILLED_SYRINGE | INTRAMUSCULAR | Status: DC

## 2020-12-22 NOTE — Progress Notes (Signed)
Patient ID: Ruben Puerto., male   DOB: 08/20/1956, 65 y.o.   MRN: [id number]  PROGRESS NOTE    Ruben Masse.  [id number] DOB: Feb 16, 1956 DOA: 12/14/2020 PCP: Ruben Pollen, MD   Brief Narrative:  65 year old male with history of hypertension, chronic kidney disease stage IV, diabetes mellitus type 2, hyperlipidemia presented with worsening shortness of breath.  On presentation, patient was found to have pulmonary edema along with acute kidney injury on chronic kidney disease with creatinine of five compared to baseline of 2.6 along with elevated troponin and chest x-ray showing pulmonary edema.  He was started on intravenous Lasix.  Nephrology was also consulted.  Subsequently, he had coffee-ground emesis for which heparin and aspirin were stopped and he was started on Protonix.  GI recommended EGD but patient refused.  During the hospitalization, renal function did not improve and patient was started on hemodialysis on 12/21/2020.  Assessment & Plan:   Acute kidney injury on chronic renal disease stage IV -Baseline creatinine 2.6.  Presented with creatinine of 5.  Treated with intravenous diuretics but kidney function did not improve.  Renal ultrasound showed small kidneys -Nephrology following.  Patient was started on hemodialysis on 12/21/2020 -He underwent tunneled dialysis catheter placement and new left upper arm AV graft on 12/21/2020 by vascular surgery.  Outpatient follow-up with vascular surgery -Will need outpatient hemodialysis unit arrangement  Acute on chronic diastolic CHF Hypertensive emergency Elevated troponins: Most likely from demand ischemia -Patient initially required Cardene drip which was subsequently discontinued. -Blood pressure has improved and intermittently still on the higher side.  Continue isosorbide mononitrate, labetalol, minoxidil -Volume currently being managed by dialysis by nephrology.  Cardiology has signed off. -Strict input and output.   Daily weights.  Fluid restriction. -Echo showed EF of 65 to 70%.  Continue statins  Acute hypoxic respiratory failure -Resolved.  Currently on room air.  Off steroids.  Continue incentive spirometry and as needed nebs  Possible upper GI bleeding -Patient had coffee-ground emesis during this hospitalization. -heparin and aspirin were stopped and he was started on Protonix.  GI recommended EGD but patient refused.   -Subsequently, hemoglobin has stabilized.  Continue twice a day oral Protonix.  Anxiety/depression -Continue Celexa  Hyponatremia -Now being managed by dialysis by nephrology.  Will DC supplementation  Chronic microcytic anemia -Hemoglobin stable.  Monitor intermittently  Diabetes mellitus type 2 with hyperglycemia -Oral meds on hold.  Continue CBGs with SSI  Generalized conditioning -Follow PT recommendations.   DVT prophylaxis: SCDs Code Status: Full Family Communication: None Disposition Plan: Status is: Inpatient  Remains inpatient appropriate because:Hemodynamically unstable and Inpatient level of care appropriate due to severity of illness. Will need outpatient hemodialysis unit arrangement    Dispo: The patient is from: Home              Anticipated d/c is to: Home              Anticipated d/c date is: 3 days              Patient currently is not medically stable to d/c.   Difficult to place patient No   Consultants: Nephrology/vascular surgery/cardiology/GI  Procedures:  tunneled dialysis catheter placement and new left upper arm AV graft on 12/21/2020 by vascular surgery Antimicrobials:  Anti-infectives (From admission, onward)   Start     Dose/Rate Route Frequency Ordered Stop   12/18/20 0000  ceFAZolin (ANCEF) IVPB 1 g/50 mL premix  Status:  Discontinued  Note to Pharmacy: Send with pt to OR   1 g 100 mL/hr over 30 Minutes Intravenous On call 12/17/20 1511 12/17/20 1513   12/18/20 0000  ceFAZolin (ANCEF) IVPB 2g/100 mL premix       Note  to Pharmacy: Send with pt to OR   2 g 200 mL/hr over 30 Minutes Intravenous On call 12/17/20 1513 12/19/20 0000       Subjective: Patient seen and examined at bedside.  Denies worsening shortness with, chest pain, nausea or vomiting.  Objective: Vitals:   12/21/20 1753 12/21/20 2143 12/22/20 0500 12/22/20 0800  BP:  (!) 151/65 137/70 (!) 145/68  Pulse: 80 72 75 81  Resp: '20 16 16 19  '$ Temp: 97.8 F (36.6 C) 97.9 F (36.6 C) 97.7 F (36.5 C) 97.6 F (36.4 C)  TempSrc: Axillary Oral Oral Oral  SpO2: 97% 99% 93% 95%  Weight:   71.4 kg   Height:        Intake/Output Summary (Last 24 hours) at 12/22/2020 1134 Last data filed at 12/21/2020 2015 Gross per 24 hour  Intake 340 ml  Output 520 ml  Net -180 ml   Filed Weights   12/21/20 1507 12/21/20 1717 12/22/20 0500  Weight: 73.7 kg 73.2 kg 71.4 kg    Examination:  General exam: Appears calm and comfortable.  Looks chronically ill.  Poor historian.  Currently on room air. Respiratory system: Bilateral decreased breath sounds at bases with scattered crackles Cardiovascular system: S1 & S2 heard, Rate controlled Gastrointestinal system: Abdomen is nondistended, soft and nontender. Normal bowel sounds heard. Extremities: No cyanosis, clubbing; lower extremity edema present Central nervous system: Alert and oriented. No focal neurological deficits. Moving extremities Skin: No rashes, lesions or ulcers Psychiatry: Flat affect  Data Reviewed: I have personally reviewed following labs and imaging studies  CBC: Recent Labs  Lab 12/16/20 0138 12/18/20 0003 12/18/20 1100 12/19/20 0324 12/19/20 0955 12/20/20 0520 12/20/20 0954 12/21/20 0155 12/21/20 1359 12/21/20 1806 12/22/20 0135 12/22/20 0944  WBC 8.9 8.5  --  8.6  --  8.1  --  8.7  --   --   --   --   NEUTROABS 7.3  --   --  7.4  --  7.0  --  7.6  --   --   --   --   HGB 11.5* 11.2*   < > 11.2*   < > 10.3*   < > 9.9* 9.2* 9.7* 8.9* 8.8*  HCT 38.4* 36.0*   < > 35.4*    < > 32.7*   < > 29.6* 28.6* 30.9* 27.9* 28.7*  MCV 77.4* 74.8*  --  75.6*  --  75.2*  --  74.2*  --   --   --   --   PLT 192 246  --  252  --  225  --  221  --   --   --   --    < > = values in this interval not displayed.   Basic Metabolic Panel: Recent Labs  Lab 12/16/20 0138 12/17/20 0157 12/18/20 0003 12/19/20 0324 12/20/20 0520 12/21/20 1507  NA 135 131* 129* 133* 130* 135  K 4.0 3.8 3.9 4.0 4.0 4.1  CL 97* 89* 90* 90* 91* 96*  CO2 '23 24 25 22 '$ 21* 22  GLUCOSE 195* 169* 237* 172* 163* 166*  BUN 101* 110* 120* 134* 140* 145*  CREATININE 4.72* 4.98* 5.35* 6.12* 6.58* 7.09*  CALCIUM 8.6* 8.6* 8.5* 8.8* 8.5* 8.0*  MG 2.3  --   --   --   --   --   PHOS 5.3*  --   --   --   --  10.2*   GFR: Estimated Creatinine Clearance: 10.4 mL/min (A) (by C-G formula based on SCr of 7.09 mg/dL (H)). Liver Function Tests: Recent Labs  Lab 12/16/20 0138 12/21/20 1507  AST 22  --   ALT 36  --   ALKPHOS 75  --   BILITOT 1.0  --   PROT 6.2*  --   ALBUMIN 2.9* 2.8*   Recent Labs  Lab 12/18/20 0003  LIPASE 67*   No results for input(s): AMMONIA in the last 168 hours. Coagulation Profile: No results for input(s): INR, PROTIME in the last 168 hours. Cardiac Enzymes: No results for input(s): CKTOTAL, CKMB, CKMBINDEX, TROPONINI in the last 168 hours. BNP (last 3 results) No results for input(s): PROBNP in the last 8760 hours. HbA1C: No results for input(s): HGBA1C in the last 72 hours. CBG: Recent Labs  Lab 12/21/20 0948 12/21/20 1231 12/21/20 1802 12/21/20 2137 12/22/20 0810  GLUCAP 160* 147* 132* 276* 146*   Lipid Profile: No results for input(s): CHOL, HDL, LDLCALC, TRIG, CHOLHDL, LDLDIRECT in the last 72 hours. Thyroid Function Tests: No results for input(s): TSH, T4TOTAL, FREET4, T3FREE, THYROIDAB in the last 72 hours. Anemia Panel: Recent Labs    12/21/20 1806  FERRITIN 592*  TIBC 298  IRON 67   Sepsis Labs: Recent Labs  Lab 12/18/20 0003  LATICACIDVEN 1.0     Recent Results (from the past 240 hour(s))  Resp Panel by RT-PCR (Flu A&B, Covid) Nasopharyngeal Swab     Status: None   Collection Time: 12/14/20 11:00 AM   Specimen: Nasopharyngeal Swab; Nasopharyngeal(NP) swabs in vial transport medium  Result Value Ref Range Status   SARS Coronavirus 2 by RT PCR NEGATIVE NEGATIVE Final    Comment: (NOTE) SARS-CoV-2 target nucleic acids are NOT DETECTED.  The SARS-CoV-2 RNA is generally detectable in upper respiratory specimens during the acute phase of infection. The lowest concentration of SARS-CoV-2 viral copies this assay can detect is 138 copies/mL. A negative result does not preclude SARS-Cov-2 infection and should not be used as the sole basis for treatment or other patient management decisions. A negative result may occur with  improper specimen collection/handling, submission of specimen other than nasopharyngeal swab, presence of viral mutation(s) within the areas targeted by this assay, and inadequate number of viral copies(<138 copies/mL). A negative result must be combined with clinical observations, patient history, and epidemiological information. The expected result is Negative.  Fact Sheet for Patients:  EntrepreneurPulse.com.au  Fact Sheet for Healthcare Providers:  IncredibleEmployment.be  This test is no t yet approved or cleared by the [place] FDA and  has been authorized for detection and/or diagnosis of SARS-CoV-2 by FDA under an Emergency Use Authorization (EUA). This EUA will remain  in effect (meaning this test can be used) for the duration of the COVID-19 declaration under Section 564(b)(1) of the Act, 21 U.S.C.section 360bbb-3(b)(1), unless the authorization is terminated  or revoked sooner.       Influenza A by PCR NEGATIVE NEGATIVE Final   Influenza B by PCR NEGATIVE NEGATIVE Final    Comment: (NOTE) The Xpert Xpress SARS-CoV-2/FLU/RSV plus assay is intended as an  aid in the diagnosis of influenza from Nasopharyngeal swab specimens and should not be used as a sole basis for treatment. Nasal washings and aspirates are unacceptable for Xpert Xpress  SARS-CoV-2/FLU/RSV testing.  Fact Sheet for Patients: EntrepreneurPulse.com.au  Fact Sheet for Healthcare Providers: IncredibleEmployment.be  This test is not yet approved or cleared by the [place] FDA and has been authorized for detection and/or diagnosis of SARS-CoV-2 by FDA under an Emergency Use Authorization (EUA). This EUA will remain in effect (meaning this test can be used) for the duration of the COVID-19 declaration under Section 564(b)(1) of the Act, 21 U.S.C. section 360bbb-3(b)(1), unless the authorization is terminated or revoked.  Performed at [facility], [place] [address], [place], [place] [address]   Surgical PCR screen     Status: None   Collection Time: 12/18/20  6:34 AM   Specimen: Nasal Mucosa; Nasal Swab  Result Value Ref Range Status   MRSA, PCR NEGATIVE NEGATIVE Final   Staphylococcus aureus NEGATIVE NEGATIVE Final    Comment: (NOTE) The Xpert SA Assay (FDA approved for NASAL specimens in patients 16 years of age and older), is one component of a comprehensive surveillance program. It is not intended to diagnose infection nor to guide or monitor treatment. Performed at [facility], [place] [address], [place], [place] [address]          Radiology Studies: DG Chest Port 1 View  Result Date: 12/21/2020 CLINICAL DATA:  End-stage renal disease. Dialysis catheter placement. EXAM: PORTABLE CHEST 1 VIEW COMPARISON:  12/16/2020 FINDINGS: A right jugular catheter has been placed and terminates near the superior cavoatrial junction. The cardiac silhouette remains mildly enlarged. There is persistent elevation of the right hemidiaphragm. Patchy bilateral airspace disease, greatest in the lung bases, has not significantly  changed. No sizable pleural effusion or pneumothorax is identified. IMPRESSION: 1. Interval right jugular catheter placement as above. 2. Unchanged bilateral airspace disease. Electronically Signed   By: Logan Bores M.D.   On: 12/21/2020 12:54   DG Fluoro Guide CV Line-No Report  Result Date: 12/21/2020 Fluoroscopy was utilized by the requesting physician.  No radiographic interpretation.   ECHOCARDIOGRAM COMPLETE  Result Date: 12/21/2020    ECHOCARDIOGRAM REPORT   Patient Name:   Ruben Camacho. Date of Exam: 12/20/2020 Medical Rec #:  [id number]          Height:       69.0 in Accession #:    [id number]         Weight:       159.6 lb Date of Birth:  30-Sep-1956          BSA:          1.877 m Patient Age:    62 years           BP:           123/70 mmHg Patient Gender: M                  HR:           76 bpm. Exam Location:  Inpatient Procedure: 2D Echo, Cardiac Doppler and Color Doppler Indications:    Abnormal ECG R94.31  History:        Patient has prior history of Echocardiogram examinations, most                 recent 12/14/2020. CHF, Stroke; Risk Factors:Hypertension,                 Dyslipidemia and Diabetes.  Sonographer:    Alvino Chapel RCS Referring Phys: [phone] AVA SWAYZE IMPRESSIONS  1. Compared to echo from 12/14/20 LVEF has improved.  2. Left ventricular ejection fraction, by estimation, is 65 to 70%. The left ventricle has normal function. The left ventricle has no regional wall motion abnormalities. There is severe left ventricular hypertrophy. Left ventricular diastolic parameters  are consistent with Grade I diastolic dysfunction (impaired relaxation).  3. Right ventricular systolic function is normal. The right ventricular size is normal.  4. Left atrial size was mildly dilated.  5. The mitral valve is normal in structure. Mild mitral valve regurgitation.  6. The aortic valve is normal in structure. Aortic valve regurgitation is not visualized.  7. The inferior vena cava is normal in size  with greater than 50% respiratory variability, suggesting right atrial pressure of 3 mmHg. FINDINGS  Left Ventricle: Left ventricular ejection fraction, by estimation, is 65 to 70%. The left ventricle has normal function. The left ventricle has no regional wall motion abnormalities. The left ventricular internal cavity size was normal in size. There is  severe left ventricular hypertrophy. Left ventricular diastolic parameters are consistent with Grade I diastolic dysfunction (impaired relaxation). Right Ventricle: The right ventricular size is normal. Right vetricular wall thickness was not assessed. Right ventricular systolic function is normal. Left Atrium: Left atrial size was mildly dilated. Right Atrium: Right atrial size was normal in size. Pericardium: Trivial pericardial effusion is present. Mitral Valve: The mitral valve is normal in structure. Mild mitral valve regurgitation. Tricuspid Valve: The tricuspid valve is normal in structure. Tricuspid valve regurgitation is trivial. Aortic Valve: The aortic valve is normal in structure. Aortic valve regurgitation is not visualized. Pulmonic Valve: The pulmonic valve was normal in structure. Pulmonic valve regurgitation is not visualized. Aorta: The aortic root and ascending aorta are structurally normal, with no evidence of dilitation. Venous: The inferior vena cava is normal in size with greater than 50% respiratory variability, suggesting right atrial pressure of 3 mmHg. IAS/Shunts: The interatrial septum was not assessed.  LEFT VENTRICLE PLAX 2D LVIDd:         4.20 cm  Diastology LVIDs:         2.70 cm  LV e' medial:    5.44 cm/s LV PW:         1.80 cm  LV E/e' medial:  15.9 LV IVS:        1.50 cm  LV e' lateral:   7.07 cm/s LVOT diam:     1.90 cm  LV E/e' lateral: 12.2 LV SV:         64 LV SV Index:   34 LVOT Area:     2.84 cm  RIGHT VENTRICLE RV S prime:     21.90 cm/s TAPSE (M-mode): 2.3 cm LEFT ATRIUM             Index       RIGHT ATRIUM           Index  LA diam:        4.10 cm 2.18 cm/m  RA Area:     19.00 cm LA Vol (A2C):   70.7 ml 37.66 ml/m RA Volume:   50.50 ml  26.90 ml/m LA Vol (A4C):   67.4 ml 35.91 ml/m LA Biplane Vol: 71.2 ml 37.93 ml/m  AORTIC VALVE LVOT Vmax:   138.00 cm/s LVOT Vmean:  95.800 cm/s LVOT VTI:    0.225 m  AORTA Ao Root diam: 3.20 cm MITRAL VALVE MV Area (PHT): 3.20 cm    SHUNTS MV Decel Time: 237 msec    Systemic VTI:  0.22 m MV E velocity: 86.50  cm/s  Systemic Diam: 1.90 cm MV A velocity: 92.50 cm/s MV E/A ratio:  0.94 Dorris Carnes MD Electronically signed by Dorris Carnes MD Signature Date/Time: 12/21/2020/1:19:23 PM    Final         Scheduled Meds: . atorvastatin  40 mg Oral q1800  . Chlorhexidine Gluconate Cloth  6 each Topical Q0600  . Chlorhexidine Gluconate Cloth  6 each Topical Q0600  . citalopram  10 mg Oral Daily  . feeding supplement  237 mL Oral BID BM  . hydrALAZINE  100 mg Oral Q8H  . influenza vac split quadrivalent PF  0.5 mL Intramuscular Tomorrow-1000  . insulin aspart  0-9 Units Subcutaneous TID WC  . isosorbide mononitrate  60 mg Oral Daily  . labetalol  200 mg Oral BID  . melatonin  5 mg Oral QHS  . minoxidil  2.5 mg Oral Daily  . mupirocin ointment  1 application Nasal BID  . pantoprazole  40 mg Oral BID  . pneumococcal 23 valent vaccine  0.5 mL Intramuscular Tomorrow-1000  . polyethylene glycol  17 g Oral Daily  . sodium chloride flush  3 mL Intravenous Q12H  . sodium chloride  1 g Oral BID WC   Continuous Infusions: . sodium chloride            Aline August, MD [facility] 12/22/2020, 11:34 AM

## 2020-12-22 NOTE — Progress Notes (Addendum)
[facility] ROUNDING NOTE   Subjective:   Seen and examined on dialysis.  Procedure supervised.  Blood pressure 105/55 and HR 70.  Tolerating goal.  RIJ tunn catheter.  Last HD on 2/21 after access placed with vascular.  No uop over 2/21 and 0.5 kg with HD.  Labs weren't drawn this am and has been on HD a while - wouldn't be accurate.    Review of systems:   Denies shortness of breath or chest pain Denies n/v  ---------------- Brief history: This is a 65 year old gentleman with history of diabetes hypertension which has been malignant times, hyperlipidemia history of stroke, gout stage IV chronic kidney disease with a baseline serum creatinine by 2.05 August 2020.   He presented with dyspnea and lower extremity swelling.  He was also found to have a blood pressure was elevated to [id number] systolic.  His blood sugar was 320 and creatinine elevated at 5 mg/dL with chest x-ray which was consistent with pulmonary edema.  There is reported history of nonsteroidal anti-inflammatory drug use. Urinalysis showed no white blood cells no red blood cells.  There was greater than 300 mg of proteinuria noted on 08/25/2020 however only 100 mg/dL noted on 12/15/2020.  Renal ultrasound showed increased bilateral renal echogenicity no hydronephrosis.  Kidney size appears to be small bilaterally.     Objective:  Vital signs in last 24 hours:  Temp:  [97.2 F (36.2 C)-97.9 F (36.6 C)] 97.7 F (36.5 C) (02/22 1150) Pulse Rate:  [72-81] 81 (02/22 0800) Resp:  [11-25] 21 (02/22 1200) BP: (111-173)/(55-74) 114/59 (02/22 1200) SpO2:  [93 %-100 %] 96 % (02/22 1150) Weight:  [71.4 kg-73.7 kg] 71.7 kg (02/22 1150)  Weight change: 3.256 kg Filed Weights   12/21/20 1717 12/22/20 0500 12/22/20 1150  Weight: 73.2 kg 71.4 kg 71.7 kg    Intake/Output: I/O last 3 completed shifts: In: 950 [P.O.:300; I.V.:600; IV Piggyback:50] Out: 1120 [Urine:600; Other:500; Blood:20]   Intake/Output this shift:  No  intake/output data recorded.  GEN: Thin, sitting in bed, nad  ENT: no nasal discharge, mmm EYES: no scleral icterus, eomi CV: normal rate, regular rhythm PULM: no iwob, bilateral chest rise ABD: NABS, non-distended SKIN: no rashes or jaundice EXT: no edema, warm and well perfused Access RIJ tunn catheter; lue AVF bruit and thrill   Basic Metabolic Panel: Recent Labs  Lab 12/16/20 0138 12/17/20 0157 12/18/20 0003 12/19/20 0324 12/20/20 0520 12/21/20 1507  NA 135 131* 129* 133* 130* 135  K 4.0 3.8 3.9 4.0 4.0 4.1  CL 97* 89* 90* 90* 91* 96*  CO2 '23 24 25 22 '$ 21* 22  GLUCOSE 195* 169* 237* 172* 163* 166*  BUN 101* 110* 120* 134* 140* 145*  CREATININE 4.72* 4.98* 5.35* 6.12* 6.58* 7.09*  CALCIUM 8.6* 8.6* 8.5* 8.8* 8.5* 8.0*  MG 2.3  --   --   --   --   --   PHOS 5.3*  --   --   --   --  10.2*    Liver Function Tests: Recent Labs  Lab 12/16/20 0138 12/21/20 1507  AST 22  --   ALT 36  --   ALKPHOS 75  --   BILITOT 1.0  --   PROT 6.2*  --   ALBUMIN 2.9* 2.8*   Recent Labs  Lab 12/18/20 0003  LIPASE 67*   No results for input(s): AMMONIA in the last 168 hours.  CBC: Recent Labs  Lab 12/16/20 0138 12/18/20 0003 12/18/20 1100  12/19/20 0324 12/19/20 0955 12/20/20 0520 12/20/20 0954 12/21/20 0155 12/21/20 1359 12/21/20 1806 12/22/20 0135 12/22/20 0944  WBC 8.9 8.5  --  8.6  --  8.1  --  8.7  --   --   --   --   NEUTROABS 7.3  --   --  7.4  --  7.0  --  7.6  --   --   --   --   HGB 11.5* 11.2*   < > 11.2*   < > 10.3*   < > 9.9* 9.2* 9.7* 8.9* 8.8*  HCT 38.4* 36.0*   < > 35.4*   < > 32.7*   < > 29.6* 28.6* 30.9* 27.9* 28.7*  MCV 77.4* 74.8*  --  75.6*  --  75.2*  --  74.2*  --   --   --   --   PLT 192 246  --  252  --  225  --  221  --   --   --   --    < > = values in this interval not displayed.    Cardiac Enzymes: No results for input(s): CKTOTAL, CKMB, CKMBINDEX, TROPONINI in the last 168 hours.  BNP: Invalid input(s): POCBNP  CBG: Recent Labs   Lab 12/21/20 1231 12/21/20 1802 12/21/20 2137 12/22/20 0810 12/22/20 1138  GLUCAP 147* 132* 276* 146* 244*    Microbiology: Results for orders placed or performed during the hospital encounter of 12/14/20  Resp Panel by RT-PCR (Flu A&B, Covid) Nasopharyngeal Swab     Status: None   Collection Time: 12/14/20 11:00 AM   Specimen: Nasopharyngeal Swab; Nasopharyngeal(NP) swabs in vial transport medium  Result Value Ref Range Status   SARS Coronavirus 2 by RT PCR NEGATIVE NEGATIVE Final    Comment: (NOTE) SARS-CoV-2 target nucleic acids are NOT DETECTED.  The SARS-CoV-2 RNA is generally detectable in upper respiratory specimens during the acute phase of infection. The lowest concentration of SARS-CoV-2 viral copies this assay can detect is 138 copies/mL. A negative result does not preclude SARS-Cov-2 infection and should not be used as the sole basis for treatment or other patient management decisions. A negative result may occur with  improper specimen collection/handling, submission of specimen other than nasopharyngeal swab, presence of viral mutation(s) within the areas targeted by this assay, and inadequate number of viral copies(<138 copies/mL). A negative result must be combined with clinical observations, patient history, and epidemiological information. The expected result is Negative.  Fact Sheet for Patients:  EntrepreneurPulse.com.au  Fact Sheet for Healthcare Providers:  IncredibleEmployment.be  This test is no t yet approved or cleared by the [place] FDA and  has been authorized for detection and/or diagnosis of SARS-CoV-2 by FDA under an Emergency Use Authorization (EUA). This EUA will remain  in effect (meaning this test can be used) for the duration of the COVID-19 declaration under Section 564(b)(1) of the Act, 21 U.S.C.section 360bbb-3(b)(1), unless the authorization is terminated  or revoked sooner.        Influenza A by PCR NEGATIVE NEGATIVE Final   Influenza B by PCR NEGATIVE NEGATIVE Final    Comment: (NOTE) The Xpert Xpress SARS-CoV-2/FLU/RSV plus assay is intended as an aid in the diagnosis of influenza from Nasopharyngeal swab specimens and should not be used as a sole basis for treatment. Nasal washings and aspirates are unacceptable for Xpert Xpress SARS-CoV-2/FLU/RSV testing.  Fact Sheet for Patients: EntrepreneurPulse.com.au  Fact Sheet for Healthcare Providers: IncredibleEmployment.be  This test is not yet  approved or cleared by the [place] and has been authorized for detection and/or diagnosis of SARS-CoV-2 by FDA under an Emergency Use Authorization (EUA). This EUA will remain in effect (meaning this test can be used) for the duration of the COVID-19 declaration under Section 564(b)(1) of the Act, 21 U.S.C. section 360bbb-3(b)(1), unless the authorization is terminated or revoked.  Performed at [facility], [place] [address] [place], [place] [address]   Surgical PCR screen     Status: None   Collection Time: 12/18/20  6:34 AM   Specimen: Nasal Mucosa; Nasal Swab  Result Value Ref Range Status   MRSA, PCR NEGATIVE NEGATIVE Final   Staphylococcus aureus NEGATIVE NEGATIVE Final    Comment: (NOTE) The Xpert SA Assay (FDA approved for NASAL specimens in patients 62 years of age and older), is one component of a comprehensive surveillance program. It is not intended to diagnose infection nor to guide or monitor treatment. Performed at [facility], [place] [address], [place], [place] [address]     Coagulation Studies: No results for input(s): LABPROT, INR in the last 72 hours.  Urinalysis: No results for input(s): COLORURINE, LABSPEC, PHURINE, GLUCOSEU, HGBUR, BILIRUBINUR, KETONESUR, PROTEINUR, UROBILINOGEN, NITRITE, LEUKOCYTESUR in the last 72 hours.  Invalid input(s): APPERANCEUR    Imaging: DG Chest Port  1 View  Result Date: 12/21/2020 CLINICAL DATA:  End-stage renal disease. Dialysis catheter placement. EXAM: PORTABLE CHEST 1 VIEW COMPARISON:  12/16/2020 FINDINGS: A right jugular catheter has been placed and terminates near the superior cavoatrial junction. The cardiac silhouette remains mildly enlarged. There is persistent elevation of the right hemidiaphragm. Patchy bilateral airspace disease, greatest in the lung bases, has not significantly changed. No sizable pleural effusion or pneumothorax is identified. IMPRESSION: 1. Interval right jugular catheter placement as above. 2. Unchanged bilateral airspace disease. Electronically Signed   By: Logan Bores M.D.   On: 12/21/2020 12:54   DG Fluoro Guide CV Line-No Report  Result Date: 12/21/2020 Fluoroscopy was utilized by the requesting physician.  No radiographic interpretation.   ECHOCARDIOGRAM COMPLETE  Result Date: 12/21/2020    ECHOCARDIOGRAM REPORT   Patient Name:   Ruben Camacho. Date of Exam: 12/20/2020 Medical Rec #:  [id number]          Height:       69.0 in Accession #:    [id number]         Weight:       159.6 lb Date of Birth:  04-Aug-1956          BSA:          1.877 m Patient Age:    65 years           BP:           123/70 mmHg Patient Gender: M                  HR:           76 bpm. Exam Location:  Inpatient Procedure: 2D Echo, Cardiac Doppler and Color Doppler Indications:    Abnormal ECG R94.31  History:        Patient has prior history of Echocardiogram examinations, most                 recent 12/14/2020. CHF, Stroke; Risk Factors:Hypertension,                 Dyslipidemia and Diabetes.  Sonographer:    [place] Referring  Phys: [id number] AVA SWAYZE IMPRESSIONS  1. Compared to echo from 12/14/20 LVEF has improved.  2. Left ventricular ejection fraction, by estimation, is 65 to 70%. The left ventricle has normal function. The left ventricle has no regional wall motion abnormalities. There is severe left ventricular hypertrophy. Left  ventricular diastolic parameters  are consistent with Grade I diastolic dysfunction (impaired relaxation).  3. Right ventricular systolic function is normal. The right ventricular size is normal.  4. Left atrial size was mildly dilated.  5. The mitral valve is normal in structure. Mild mitral valve regurgitation.  6. The aortic valve is normal in structure. Aortic valve regurgitation is not visualized.  7. The inferior vena cava is normal in size with greater than 50% respiratory variability, suggesting right atrial pressure of 3 mmHg. FINDINGS  Left Ventricle: Left ventricular ejection fraction, by estimation, is 65 to 70%. The left ventricle has normal function. The left ventricle has no regional wall motion abnormalities. The left ventricular internal cavity size was normal in size. There is  severe left ventricular hypertrophy. Left ventricular diastolic parameters are consistent with Grade I diastolic dysfunction (impaired relaxation). Right Ventricle: The right ventricular size is normal. Right vetricular wall thickness was not assessed. Right ventricular systolic function is normal. Left Atrium: Left atrial size was mildly dilated. Right Atrium: Right atrial size was normal in size. Pericardium: Trivial pericardial effusion is present. Mitral Valve: The mitral valve is normal in structure. Mild mitral valve regurgitation. Tricuspid Valve: The tricuspid valve is normal in structure. Tricuspid valve regurgitation is trivial. Aortic Valve: The aortic valve is normal in structure. Aortic valve regurgitation is not visualized. Pulmonic Valve: The pulmonic valve was normal in structure. Pulmonic valve regurgitation is not visualized. Aorta: The aortic root and ascending aorta are structurally normal, with no evidence of dilitation. Venous: The inferior vena cava is normal in size with greater than 50% respiratory variability, suggesting right atrial pressure of 3 mmHg. IAS/Shunts: The interatrial septum was not  assessed.  LEFT VENTRICLE PLAX 2D LVIDd:         4.20 cm  Diastology LVIDs:         2.70 cm  LV e' medial:    5.44 cm/s LV PW:         1.80 cm  LV E/e' medial:  15.9 LV IVS:        1.50 cm  LV e' lateral:   7.07 cm/s LVOT diam:     1.90 cm  LV E/e' lateral: 12.2 LV SV:         64 LV SV Index:   34 LVOT Area:     2.84 cm  RIGHT VENTRICLE RV S prime:     21.90 cm/s TAPSE (M-mode): 2.3 cm LEFT ATRIUM             Index       RIGHT ATRIUM           Index LA diam:        4.10 cm 2.18 cm/m  RA Area:     19.00 cm LA Vol (A2C):   70.7 ml 37.66 ml/m RA Volume:   50.50 ml  26.90 ml/m LA Vol (A4C):   67.4 ml 35.91 ml/m LA Biplane Vol: 71.2 ml 37.93 ml/m  AORTIC VALVE LVOT Vmax:   138.00 cm/s LVOT Vmean:  95.800 cm/s LVOT VTI:    0.225 m  AORTA Ao Root diam: 3.20 cm MITRAL VALVE MV Area (PHT): 3.20 cm    SHUNTS MV  Decel Time: 237 msec    Systemic VTI:  0.22 m MV E velocity: 86.50 cm/s  Systemic Diam: 1.90 cm MV A velocity: 92.50 cm/s MV E/A ratio:  0.94 Dorris Carnes MD Electronically signed by Dorris Carnes MD Signature Date/Time: 12/21/2020/1:19:23 PM    Final      Medications:   . sodium chloride     . atorvastatin  40 mg Oral q1800  . Chlorhexidine Gluconate Cloth  6 each Topical Q0600  . Chlorhexidine Gluconate Cloth  6 each Topical Q0600  . citalopram  10 mg Oral Daily  . feeding supplement  237 mL Oral BID BM  . hydrALAZINE  100 mg Oral Q8H  . influenza vac split quadrivalent PF  0.5 mL Intramuscular Tomorrow-1000  . insulin aspart  0-9 Units Subcutaneous TID WC  . isosorbide mononitrate  60 mg Oral Daily  . labetalol  200 mg Oral BID  . melatonin  5 mg Oral QHS  . minoxidil  2.5 mg Oral Daily  . mupirocin ointment  1 application Nasal BID  . pantoprazole  40 mg Oral BID  . pneumococcal 23 valent vaccine  0.5 mL Intramuscular Tomorrow-1000  . polyethylene glycol  17 g Oral Daily  . sodium chloride flush  3 mL Intravenous Q12H  . sodium chloride  1 g Oral BID WC   sodium chloride, acetaminophen,  benzonatate, chlorpheniramine-HYDROcodone, guaiFENesin-dextromethorphan, levalbuterol, LORazepam, ondansetron (ZOFRAN) IV, oxyCODONE-acetaminophen, sodium chloride flush  Assessment/ Plan:  1.AKI on CKD: Creatinine 2.6 in October 2021.  Rapidly progressing kidney loss.  Ultrasound with small kidneys.  Possible renal artery stenosis based on kidney size.  RIJ tunn catheter and LUE avf on 2/21 with vascular.  First HD on 2/21 - HD today then will assess needs daily.  Anticipate next tx on 2/24.  - Monitor for signs of recovery - Per charting renal artery duplex was ordered but not done and HTN controlled  2. Hypertension/volume  -malignant hypertension on arrival with flash pulmonary edema intermittently.  Blood pressure much improved.  Per charting renal artery duplex ordered given history of recurrent intermittent hypertension with volume overload; awaiting results   3. Anemia CKD - feraheme once; aranesp 40 mcg once on 2/22   4.  Hypokalemia-resolved with supplementation  5.  A. Fib- could be causing some variable renal perfusion-cardiology has seen   6. Hyponatremia: resolved. Likely related to free water retention in setting of kidney disease. Note that he was on salt tabs - have stopped  7. Metabolic bone disease - check intact PTH and start sevelamer. On renal diet  Claudia Desanctis, MD 12/22/2020 1:00 PM   Stopping minoxidil - monitor off med  Claudia Desanctis, MD 1:02 PM 12/22/2020  Spoke with HD SW - CLIP as AKI on CKD stage IV for now.  Unsure how likely he is to recover function.  We are continuing to watch here  Claudia Desanctis, MD 12/22/2020 4:43 PM

## 2020-12-22 NOTE — Progress Notes (Signed)
Nutrition Follow-up  DOCUMENTATION CODES:   Severe malnutrition in context of acute illness/injury  INTERVENTION:    Change supplements to Nepro Shake po BID, each supplement provides 425 kcal and 19 grams protein  Renal MVI daily  NUTRITION DIAGNOSIS:   Severe Malnutrition related to acute illness (SOB r/t CHF causing poor appetite) as evidenced by mild fat depletion,mild muscle depletion,moderate muscle depletion,moderate fat depletion.  GOAL:   Patient will meet greater than or equal to 90% of their needs  MONITOR:   PO intake,Supplement acceptance,Labs  REASON FOR ASSESSMENT:   Malnutrition Screening Tool    ASSESSMENT:   65 yo male admitted with increasing SOB, CHF decompensation r/t hypertensive urgency. PMH includes HTN, CKD-3b, DM-2, HLD.   2/21 tunneled HD catheter and LUE AV graft placed. Received first HD 2/21 and HD again today. Next HD planned for 2/24.  Currently on a renal carbohydrate modified diet. Meal intakes 0-100% Drinking Ensure supplements on average once daily.  Labs (2/21) reviewed. Phos 10.2, BUN 145, creatinine 7.09 CBG: [id number]  Medications reviewed and include Aranesp, Novolog, Miralax, Renvela.   Diet Order:   Diet Order            Diet renal/carb modified with fluid restriction Fluid restriction: 1200 mL Fluid; Room service appropriate? Yes; Fluid consistency: Thin  Diet effective now                 EDUCATION NEEDS:   Not appropriate for education at this time  Skin:  Skin Assessment: Reviewed RN Assessment  Last BM:  2/22 type 6  Height:   Ht Readings from Last 1 Encounters:  12/17/20 '5\' 9"'$  (1.753 m)    Weight:   Wt Readings from Last 1 Encounters:  12/22/20 70.7 kg    Ideal Body Weight:  72.7 kg  BMI:  Body mass index is 23.02 kg/m.  Estimated Nutritional Needs:   Kcal:  2200-2500  Protein:  110-130 gm  Fluid:  1 L + UOP    Lucas Mallow, RD, LDN, CNSC Please refer to Amion for contact  information.

## 2020-12-22 NOTE — Progress Notes (Signed)
   VASCULAR SURGERY ASSESSMENT & PLAN:   POD 1 LEFT AV GRAFT AND TDC: The graft has an excellent thrill.  There is a palpable left radial pulse.  The graft can be used in 1 month.  Vascular surgery will be available as needed.   SUBJECTIVE:   No specific complaints.  PHYSICAL EXAM:   Vitals:   12/21/20 1753 12/21/20 2143 12/22/20 0500 12/22/20 0800  BP:  (!) 151/65 137/70 (!) 145/68  Pulse: 80 72 75 81  Resp: '20 16 16 19  '$ Temp: 97.8 F (36.6 C) 97.9 F (36.6 C) 97.7 F (36.5 C) 97.6 F (36.4 C)  TempSrc: Axillary Oral Oral Oral  SpO2: 97% 99% 93% 95%  Weight:   71.4 kg   Height:       His incisions look fine. The graft has an excellent thrill.   He has a palpable left radial pulse.  LABS:   Lab Results  Component Value Date   WBC 8.7 12/21/2020   HGB 8.9 (L) 12/22/2020   HCT 27.9 (L) 12/22/2020   MCV 74.2 (L) 12/21/2020   PLT 221 12/21/2020   CBG (last 3)  Recent Labs    12/21/20 1802 12/21/20 2137 12/22/20 0810  GLUCAP 132* 276* 146*    PROBLEM LIST:    Active Problems:   CHF (congestive heart failure) (HCC)   Protein-calorie malnutrition, severe   CURRENT MEDS:   . atorvastatin  40 mg Oral q1800  . Chlorhexidine Gluconate Cloth  6 each Topical Q0600  . Chlorhexidine Gluconate Cloth  6 each Topical Q0600  . citalopram  10 mg Oral Daily  . feeding supplement  237 mL Oral BID BM  . hydrALAZINE  100 mg Oral Q8H  . influenza vac split quadrivalent PF  0.5 mL Intramuscular Tomorrow-1000  . insulin aspart  0-9 Units Subcutaneous TID WC  . isosorbide mononitrate  60 mg Oral Daily  . labetalol  200 mg Oral BID  . melatonin  5 mg Oral QHS  . minoxidil  2.5 mg Oral Daily  . mupirocin ointment  1 application Nasal BID  . pantoprazole  40 mg Oral BID  . pneumococcal 23 valent vaccine  0.5 mL Intramuscular Tomorrow-1000  . polyethylene glycol  17 g Oral Daily  . sodium chloride flush  3 mL Intravenous Q12H  . sodium chloride  1 g Oral BID WC     Deitra Mayo Office: [phone] 12/22/2020

## 2020-12-22 NOTE — Evaluation (Signed)
Occupational Therapy Evaluation Patient Details Name: Ruben Camacho. MRN: [id number] DOB: 08-Apr-1956 Today's Date: 12/22/2020    History of Present Illness Pt presented with SOB and found to have acute on chronic CHF, due to hypertensive urgency and AKI on CKD. Pt with coffee ground emesis with GI consulted and recommended EGD which pt declined. Pt underwent placement of tunneled HD catheter as well as new lt upper arm AV graft for initiation of HD on 2/21.  PMH - HTN, CKD, DM, gout, CVA   Clinical Impression   Pt PTA: living alone and independent. Pt currently, minA for ADL and minguardA to minA for mobility with no AD. Pt self limiting today for eval stating "I just had my procedure." Pt hyper focused on bowel movement today and transferred to [facility] and required assist for pericare. Pt set-upA to maxA for ADL. Pt requiring cues for impulsivity assuming its related to rushing to [facility].  Pt would benefit from continued OT skilled services for ADL, mobility and safety in [place] setting. OT following acutely.    Follow Up Recommendations  Home health OT;Supervision - Intermittent    Equipment Recommendations  3 in 1 bedside commode    Recommendations for Other Services       Precautions / Restrictions Precautions Precautions: Fall Restrictions Weight Bearing Restrictions: No      Mobility Bed Mobility Overal bed mobility: Needs Assistance Bed Mobility: Supine to Sit;Sit to Supine     Supine to sit: Supervision Sit to supine: Min assist   General bed mobility comments: SupervisionA for safety; minA for LB assist    Transfers Overall transfer level: Needs assistance Equipment used: 1 person hand held assist Transfers: Sit to/from [facility] Sit to Stand: Min assist Stand pivot transfers: Min assist       General transfer comment: MinA for power up and smooth descent to [facility]    Balance Overall balance assessment: Mild deficits observed, not formally  tested                                         ADL either performed or assessed with clinical judgement   ADL Overall ADL's : Needs assistance/impaired Eating/Feeding: Set up;Sitting   Grooming: Set up;Sitting   Upper Body Bathing: Set up;Sitting   Lower Body Bathing: Minimal assistance;Sitting/lateral leans;Sit to/from stand Lower Body Bathing Details (indicate cue type and reason): difficulty standing due to constipation and discomfort Upper Body Dressing : Set up;Sitting   Lower Body Dressing: Sit to/from stand;Maximal assistance;Cueing for safety;Sitting/lateral leans Lower Body Dressing Details (indicate cue type and reason): Unable to assist at EOB; unsure if pt was hyperfocused on impending bowel movement Toilet Transfer: Minimal assistance;Stand-pivot;BSC Toilet Transfer Details (indicate cue type and reason): stand pivot to [facility] close by; impulsive and requiring assist to sit abruptly on [facility]   Toileting - Clothing Manipulation Details (indicate cue type and reason): standing briefly to assist with pericare; pt leaning on bed on elbows and unable to assist due to discomfort and exhaustion     Functional mobility during ADLs: Min guard;Cueing for safety;Cueing for sequencing General ADL Comments: Pt limited by session hyperfocused on having a bowel movement; pt had one, but was unable to complete it. Pt reported feeling "worn out" after short ADL session.     Vision Baseline Vision/History: No visual deficits Patient Visual Report: No change from baseline Vision Assessment?: No apparent  visual deficits     Perception     Praxis      Pertinent Vitals/Pain Pain Assessment: No/denies pain     Hand Dominance Right   Extremity/Trunk Assessment Upper Extremity Assessment Upper Extremity Assessment: Generalized weakness   Lower Extremity Assessment Lower Extremity Assessment: Generalized weakness   Cervical / Trunk Assessment Cervical / Trunk  Assessment: Normal   Communication Communication Communication: No difficulties   Cognition Arousal/Alertness: Awake/alert Behavior During Therapy: WFL for tasks assessed/performed;Anxious;Impulsive Overall Cognitive Status: Within Functional Limits for tasks assessed                                 General Comments: Pt focused on having a BM   General Comments  VSS on RA    Exercises     Shoulder Instructions      Home Living Family/patient expects to be discharged to:: Private residence Living Arrangements: Alone Available Help at Discharge: Family;Friend(s);Available PRN/intermittently Type of Home: Apartment Home Access: Stairs to enter Entrance Stairs-Number of Steps: 20 Entrance Stairs-Rails: Left;Right Home Layout: One level     Bathroom Shower/Tub: Teacher, early years/pre: Standard     Home Equipment: None          Prior Functioning/Environment Level of Independence: Independent                 OT Problem List: Decreased strength;Decreased activity tolerance;Impaired balance (sitting and/or standing);Decreased safety awareness;Pain;Cardiopulmonary status limiting activity      OT Treatment/Interventions: Self-care/ADL training;Therapeutic exercise;Energy conservation;Therapeutic activities;Patient/family education;Balance training    OT Goals(Current goals can be found in the care plan section) Acute Rehab OT Goals Patient Stated Goal: to complete a bowel movement OT Goal Formulation: With patient Time For Goal Achievement: 01/05/21 Potential to Achieve Goals: Good ADL Goals Pt Will Perform Grooming: with supervision;standing Pt Will Perform Lower Body Dressing: with supervision;sit to/from stand Pt Will Transfer to Toilet: with supervision;ambulating;regular height toilet Pt Will Perform Toileting - Clothing Manipulation and hygiene: with supervision;sit to/from stand Additional ADL Goal #1: Pt will increase to  supervisionA for OOB ADL x10 mins with 1 seated rest break to increase independence for ADL.  OT Frequency: Min 2X/week   Barriers to D/C: Decreased caregiver support          Co-evaluation              AM-PAC OT "6 Clicks" Daily Activity     Outcome Measure Help from another person eating meals?: None Help from another person taking care of personal grooming?: A Little Help from another person toileting, which includes using toliet, bedpan, or urinal?: A Little Help from another person bathing (including washing, rinsing, drying)?: A Lot Help from another person to put on and taking off regular upper body clothing?: A Little Help from another person to put on and taking off regular lower body clothing?: A Lot 6 Click Score: 17   End of Session Equipment Utilized During Treatment: Gait belt Nurse Communication: Mobility status;Other (comment) (use BSC not bed pan)  Activity Tolerance: Patient limited by fatigue;Patient limited by lethargy;Patient limited by pain;Other (comment) (Limited eval due to discomfort and focus on BM. Pt unwilling to participate further and plopped onto bed and with time scooted further into bed.) Patient left: in bed;with call bell/phone within reach;with bed alarm set  OT Visit Diagnosis: Unsteadiness on feet (R26.81);Muscle weakness (generalized) (M62.81);Pain  Time: [id number] OT Time Calculation (min): 33 min Charges:  OT General Charges $OT Visit: 1 Visit OT Evaluation $OT Eval Moderate Complexity: 1 Mod OT Treatments $Self Care/Home Management : 8-22 mins  Ruben Camacho, OTR/L Acute Rehabilitation Services Pager: [phone] Office: [phone]'  Aahil Fredin C 12/22/2020, 6:02 PM

## 2020-12-22 NOTE — Progress Notes (Signed)
PT Cancellation Note  Patient Details Name: Ruben Camacho. MRN: [id number] DOB: 1956/07/13   Cancelled Treatment:    Reason Eval/Treat Not Completed: Patient at procedure or test/unavailable. Pt was on bedpan and now at HD. Will continue attempts.   Shary [facility] 12/22/2020, 11:56 AM [place] Pager [phone] Office [phone]

## 2020-12-22 NOTE — Plan of Care (Signed)

## 2020-12-22 NOTE — Care Management Important Message (Signed)
Important Message  Patient Details  Name: Ruben Camacho. MRN: [id number] Date of Birth: [birth year]   Medicare Important Message Given:  Yes     Shelda Altes 12/22/2020, 9:45 AM

## 2020-12-22 NOTE — Plan of Care (Signed)

## 2020-12-23 DIAGNOSIS — N179 Acute kidney failure, unspecified: Secondary | ICD-10-CM | POA: Diagnosis not present

## 2020-12-23 DIAGNOSIS — E43 Unspecified severe protein-calorie malnutrition: Secondary | ICD-10-CM | POA: Diagnosis not present

## 2020-12-23 DIAGNOSIS — I5033 Acute on chronic diastolic (congestive) heart failure: Secondary | ICD-10-CM | POA: Diagnosis not present

## 2020-12-23 LAB — GLUCOSE, CAPILLARY
Glucose-Capillary: 128 mg/dL — ABNORMAL HIGH (ref 70–99)
Glucose-Capillary: 193 mg/dL — ABNORMAL HIGH (ref 70–99)
Glucose-Capillary: 147 mg/dL — ABNORMAL HIGH (ref 70–99)
Glucose-Capillary: 164 mg/dL — ABNORMAL HIGH (ref 70–99)

## 2020-12-23 LAB — CBC WITH DIFFERENTIAL/PLATELET
Abs Immature Granulocytes: 0.05 10*3/uL (ref 0.00–0.07)
Basophils Absolute: 0 10*3/uL (ref 0.0–0.1)
Basophils Relative: 0 %
Eosinophils Absolute: 0.1 10*3/uL (ref 0.0–0.5)
Eosinophils Relative: 1 %
HCT: 26.1 % — ABNORMAL LOW (ref 39.0–52.0)
Hemoglobin: 8.3 g/dL — ABNORMAL LOW (ref 13.0–17.0)
Immature Granulocytes: 1 %
Lymphocytes Relative: 10 %
Lymphs Abs: 0.7 10*3/uL (ref 0.7–4.0)
MCH: 24.5 pg — ABNORMAL LOW (ref 26.0–34.0)
MCHC: 31.8 g/dL (ref 30.0–36.0)
MCV: 77 fL — ABNORMAL LOW (ref 80.0–100.0)
Monocytes Absolute: 0.6 10*3/uL (ref 0.1–1.0)
Monocytes Relative: 8 %
Neutro Abs: 6.2 10*3/uL (ref 1.7–7.7)
Neutrophils Relative %: 80 %
Platelets: 162 10*3/uL (ref 150–400)
RBC: 3.39 MIL/uL — ABNORMAL LOW (ref 4.22–5.81)
RDW: 16.1 % — ABNORMAL HIGH (ref 11.5–15.5)
WBC: 7.7 10*3/uL (ref 4.0–10.5)
nRBC: 0 % (ref 0.0–0.2)

## 2020-12-23 LAB — RENAL FUNCTION PANEL
Albumin: 2.6 g/dL — ABNORMAL LOW (ref 3.5–5.0)
Anion gap: 12 (ref 5–15)
BUN: 54 mg/dL — ABNORMAL HIGH (ref 8–23)
CO2: 25 mmol/L (ref 22–32)
Calcium: 8.1 mg/dL — ABNORMAL LOW (ref 8.9–10.3)
Chloride: 97 mmol/L — ABNORMAL LOW (ref 98–111)
Creatinine, Ser: 4.31 mg/dL — ABNORMAL HIGH (ref 0.61–1.24)
GFR, Estimated: 14 mL/min — ABNORMAL LOW (ref >60)
Glucose, Bld: 88 mg/dL (ref 70–99)
Phosphorus: 5.8 mg/dL — ABNORMAL HIGH (ref 2.5–4.6)
Potassium: 4 mmol/L (ref 3.5–5.1)
Sodium: 134 mmol/L — ABNORMAL LOW (ref 135–145)

## 2020-12-23 MED ORDER — CHLORHEXIDINE GLUCONATE CLOTH 2 % EX PADS
6.0000 | MEDICATED_PAD | Freq: Every day | CUTANEOUS | Status: DC
  Administered 2020-12-24 – 2020-12-28 (×5): 6 via TOPICAL

## 2020-12-23 NOTE — Progress Notes (Signed)
[facility] ROUNDING NOTE   Subjective:   Feels ok today.  Had 300 mL UOP over 2/22.  Had 1 kg UF with HD as well on 2/22.   Review of systems:   Denies shortness of breath or chest pain Denies n/v  ---------------- Brief history: This is a 65 year old gentleman with history of diabetes hypertension which has been malignant times, hyperlipidemia history of stroke, gout stage IV chronic kidney disease with a baseline serum creatinine by 2.05 August 2020.  He presented with dyspnea and lower extremity swelling.  He was also found to have a blood pressure was elevated to [id number] systolic.  His blood sugar was 320 and creatinine elevated at 5 mg/dL with chest x-ray which was consistent with pulmonary edema.  There is reported history of nonsteroidal anti-inflammatory drug use. Urinalysis showed no white blood cells no red blood cells.  There was greater than 300 mg of proteinuria noted on 08/25/2020 however only 100 mg/dL noted on 12/15/2020.  Renal ultrasound showed increased bilateral renal echogenicity no hydronephrosis.  Kidney size appears to be small bilaterally.     Objective:  Vital signs in last 24 hours:  Temp:  [97.8 F (36.6 C)-97.9 F (36.6 C)] 97.8 F (36.6 C) (02/23 0414) Pulse Rate:  [82] 82 (02/23 0414) Resp:  [17-23] 20 (02/23 0414) BP: (100-137)/(55-66) 129/63 (02/23 0414) SpO2:  [93 %-96 %] 93 % (02/23 0414) Weight:  [70.7 kg-71.1 kg] 71.1 kg (02/23 0414)  Weight change: -2 kg Filed Weights   12/22/20 1150 12/22/20 1427 12/23/20 0414  Weight: 71.7 kg 70.7 kg 71.1 kg    Intake/Output: I/O last 3 completed shifts: In: 240 [P.O.:240] Out: 1300 [Urine:300; Other:1000]   Intake/Output this shift:  No intake/output data recorded.  GEN: Thin, sitting in bed, nad   ENT: no nasal discharge, mmm EYES: no scleral icterus, eomi CV: S1S2 no rub PULM: clear and unlabored.  ABD: soft/NT/ND SKIN: no rashes or jaundice  EXT: no edema, warm and well  perfused Access RIJ tunn catheter; lue AVF bruit and thrill   Basic Metabolic Panel: Recent Labs  Lab 12/18/20 0003 12/19/20 0324 12/20/20 0520 12/21/20 1507 12/23/20 0203  NA 129* 133* 130* 135 134*  K 3.9 4.0 4.0 4.1 4.0  CL 90* 90* 91* 96* 97*  CO2 25 22 21* 22 25  GLUCOSE 237* 172* 163* 166* 88  BUN 120* 134* 140* 145* 54*  CREATININE 5.35* 6.12* 6.58* 7.09* 4.31*  CALCIUM 8.5* 8.8* 8.5* 8.0* 8.1*  PHOS  --   --   --  10.2* 5.8*    Liver Function Tests: Recent Labs  Lab 12/21/20 1507 12/23/20 0203  ALBUMIN 2.8* 2.6*   Recent Labs  Lab 12/18/20 0003  LIPASE 67*   No results for input(s): AMMONIA in the last 168 hours.  CBC: Recent Labs  Lab 12/18/20 0003 12/18/20 1100 12/19/20 0324 12/19/20 0955 12/20/20 0520 12/20/20 0954 12/21/20 0155 12/21/20 1359 12/21/20 1806 12/22/20 0135 12/22/20 0944 12/22/20 1740 12/23/20 0203  WBC 8.5  --  8.6  --  8.1  --  8.7  --   --   --   --   --  7.7  NEUTROABS  --   --  7.4  --  7.0  --  7.6  --   --   --   --   --  6.2  HGB 11.2*   < > 11.2*   < > 10.3*   < > 9.9*   < >  9.7* 8.9* 8.8* 8.6* 8.3*  HCT 36.0*   < > 35.4*   < > 32.7*   < > 29.6*   < > 30.9* 27.9* 28.7* 28.2* 26.1*  MCV 74.8*  --  75.6*  --  75.2*  --  74.2*  --   --   --   --   --  77.0*  PLT 246  --  252  --  225  --  221  --   --   --   --   --  162   < > = values in this interval not displayed.    CBG: Recent Labs  Lab 12/22/20 1138 12/22/20 1640 12/22/20 2013 12/23/20 0734 12/23/20 1127  GLUCAP 244* 160* 138* 128* 193*    Microbiology: Results for orders placed or performed during the hospital encounter of 12/14/20  Resp Panel by RT-PCR (Flu A&B, Covid) Nasopharyngeal Swab     Status: None   Collection Time: 12/14/20 11:00 AM   Specimen: Nasopharyngeal Swab; Nasopharyngeal(NP) swabs in vial transport medium  Result Value Ref Range Status   SARS Coronavirus 2 by RT PCR NEGATIVE NEGATIVE Final    Comment: (NOTE) SARS-CoV-2 target  nucleic acids are NOT DETECTED.  The SARS-CoV-2 RNA is generally detectable in upper respiratory specimens during the acute phase of infection. The lowest concentration of SARS-CoV-2 viral copies this assay can detect is 138 copies/mL. A negative result does not preclude SARS-Cov-2 infection and should not be used as the sole basis for treatment or other patient management decisions. A negative result may occur with  improper specimen collection/handling, submission of specimen other than nasopharyngeal swab, presence of viral mutation(s) within the areas targeted by this assay, and inadequate number of viral copies(<138 copies/mL). A negative result must be combined with clinical observations, patient history, and epidemiological information. The expected result is Negative.  Fact Sheet for Patients:  EntrepreneurPulse.com.au  Fact Sheet for Healthcare Providers:  IncredibleEmployment.be  This test is no t yet approved or cleared by the [place] FDA and  has been authorized for detection and/or diagnosis of SARS-CoV-2 by FDA under an Emergency Use Authorization (EUA). This EUA will remain  in effect (meaning this test can be used) for the duration of the COVID-19 declaration under Section 564(b)(1) of the Act, 21 U.S.C.section 360bbb-3(b)(1), unless the authorization is terminated  or revoked sooner.       Influenza A by PCR NEGATIVE NEGATIVE Final   Influenza B by PCR NEGATIVE NEGATIVE Final    Comment: (NOTE) The Xpert Xpress SARS-CoV-2/FLU/RSV plus assay is intended as an aid in the diagnosis of influenza from Nasopharyngeal swab specimens and should not be used as a sole basis for treatment. Nasal washings and aspirates are unacceptable for Xpert Xpress SARS-CoV-2/FLU/RSV testing.  Fact Sheet for Patients: EntrepreneurPulse.com.au  Fact Sheet for Healthcare  Providers: IncredibleEmployment.be  This test is not yet approved or cleared by the [place] FDA and has been authorized for detection and/or diagnosis of SARS-CoV-2 by FDA under an Emergency Use Authorization (EUA). This EUA will remain in effect (meaning this test can be used) for the duration of the COVID-19 declaration under Section 564(b)(1) of the Act, 21 U.S.C. section 360bbb-3(b)(1), unless the authorization is terminated or revoked.  Performed at [facility], [place] [address], [place], [place] [address]   Surgical PCR screen     Status: None   Collection Time: 12/18/20  6:34 AM   Specimen: Nasal Mucosa; Nasal Swab  Result Value  Ref Range Status   MRSA, PCR NEGATIVE NEGATIVE Final   Staphylococcus aureus NEGATIVE NEGATIVE Final    Comment: (NOTE) The Xpert SA Assay (FDA approved for NASAL specimens in patients 106 years of age and older), is one component of a comprehensive surveillance program. It is not intended to diagnose infection nor to guide or monitor treatment. Performed at [facility], [place] [address] [place], [place] [address]      Imaging: No results found.   Medications:   . sodium chloride     . atorvastatin  40 mg Oral q1800  . Chlorhexidine Gluconate Cloth  6 each Topical Q0600  . Chlorhexidine Gluconate Cloth  6 each Topical Q0600  . citalopram  10 mg Oral Daily  . darbepoetin (ARANESP) injection - DIALYSIS  40 mcg Intravenous Q Tue-HD  . feeding supplement (NEPRO CARB STEADY)  237 mL Oral BID BM  . hydrALAZINE  100 mg Oral Q8H  . influenza vac split quadrivalent PF  0.5 mL Intramuscular Tomorrow-1000  . insulin aspart  0-9 Units Subcutaneous TID WC  . isosorbide mononitrate  60 mg Oral Daily  . labetalol  200 mg Oral BID  . melatonin  5 mg Oral QHS  . multivitamin  1 tablet Oral QHS  . pantoprazole  40 mg Oral BID  . pneumococcal 23 valent vaccine  0.5 mL Intramuscular Tomorrow-1000  . polyethylene  glycol  17 g Oral Daily  . sevelamer carbonate  800 mg Oral TID WC  . sodium chloride flush  3 mL Intravenous Q12H   sodium chloride, acetaminophen, benzonatate, chlorpheniramine-HYDROcodone, guaiFENesin-dextromethorphan, levalbuterol, LORazepam, ondansetron (ZOFRAN) IV, oxyCODONE-acetaminophen, sodium chloride flush  Assessment/ Plan:  1.AKI on CKD: Creatinine 2.6 in October 2021.  Rapidly progressing kidney loss.  Ultrasound with small kidneys.  Possible renal artery stenosis based on kidney size.  RIJ tunn catheter and LUE avf on 2/21 with vascular.  First HD on 2/21 - next HD on 2/24  - Monitor for signs of recovery - Have started CLIP process as AKI   - Per charting renal artery duplex was ordered but not done and HTN controlled - I have discontinued this study  2. Hypertension/volume  -malignant hypertension on arrival with flash pulmonary edema intermittently.  Blood pressure much improved.  Discontinued renal artery duplex.  Stopped minoxidil.    3. Anemia CKD - s/p feraheme; aranesp 40 mcg once on 2/22   4.  Hypokalemia-resolved with supplementation  5.  A. Fib- could be causing some variable renal perfusion-cardiology has seen   6. Hyponatremia: resolved. Likely related to free water retention in setting of kidney disease. Note that he was on salt tabs - have stopped  7. Metabolic bone disease - intact PTH pending.  on sevelamer. On renal diet  Claudia Desanctis, MD 12/23/2020 1:01 PM

## 2020-12-23 NOTE — Progress Notes (Signed)
Report called to Kathlee Nations, [place] on [place].

## 2020-12-23 NOTE — Progress Notes (Signed)
Patient ID: Ruben Minnick., male   DOB: [birth year], 65 y.o.   MRN: [id number]  PROGRESS NOTE    Ruben Camacho.  [id number] DOB: 1956-08-01 DOA: 12/14/2020 PCP: Ruben Pollen, MD   Brief Narrative:  65 year old male with history of hypertension, chronic kidney disease stage IV, diabetes mellitus type 2, hyperlipidemia presented with worsening shortness of breath.  On presentation, patient was found to have pulmonary edema along with acute kidney injury on chronic kidney disease with creatinine of five compared to baseline of 2.6 along with elevated troponin and chest x-ray showing pulmonary edema.  He was started on intravenous Lasix.  Nephrology was also consulted.  Subsequently, he had coffee-ground emesis for which heparin and aspirin were stopped and he was started on Protonix.  GI recommended EGD but patient refused.  During the hospitalization, renal function did not improve and patient was started on hemodialysis on 12/21/2020.  Assessment & Plan:   Acute kidney injury on chronic renal disease stage IV -Baseline creatinine 2.6.  Presented with creatinine of 5.  Treated with intravenous diuretics but kidney function did not improve.  Renal ultrasound showed small kidneys -Nephrology following.  Patient was started on hemodialysis on 12/21/2020 -He underwent tunneled dialysis catheter placement and new left upper arm AV graft on 12/21/2020 by vascular surgery.  Outpatient follow-up with vascular surgery -Will need outpatient hemodialysis unit arrangement  Acute on chronic diastolic CHF Hypertensive emergency Elevated troponins: Most likely from demand ischemia -Patient initially required Cardene drip which was subsequently discontinued. -Blood pressure has improved and intermittently still on the higher side.  Continue isosorbide mononitrate, labetalol, minoxidil -Volume currently being managed by dialysis by nephrology.  Cardiology has signed off. -Strict input and output.   Daily weights.  Fluid restriction. -Echo showed EF of 65 to 70%.  Continue statins  Acute hypoxic respiratory failure -Resolved.  Currently on room air.  Off steroids.  Continue incentive spirometry and as needed nebs  Possible upper GI bleeding -Patient had coffee-ground emesis during this hospitalization. -heparin and aspirin were stopped and he was started on Protonix.  GI recommended EGD but patient refused.   -Subsequently, hemoglobin has stabilized.  Continue twice a day oral Protonix.  Anxiety/depression -Continue Celexa  Hyponatremia -Now being managed by dialysis by nephrology.    Chronic microcytic anemia -Hemoglobin stable.  Monitor intermittently  Diabetes mellitus type 2 with hyperglycemia -Oral meds on hold.  Continue CBGs with SSI  Severe malnutrition -Follow nutrition recommendation  Generalized conditioning -Follow PT recommendations.   DVT prophylaxis: SCDs Code Status: Full Family Communication: None Disposition Plan: Status is: Inpatient  Remains inpatient appropriate because:Hemodynamically unstable and Inpatient level of care appropriate due to severity of illness. Will need outpatient hemodialysis unit arrangement    Dispo: The patient is from: Home              Anticipated d/c is to: Home              Anticipated d/c date is: 3 days              Patient currently is not medically stable to d/c.   Difficult to place patient No   Consultants: Nephrology/vascular surgery/cardiology/GI  Procedures:  tunneled dialysis catheter placement and new left upper arm AV graft on 12/21/2020 by vascular surgery Antimicrobials:  Anti-infectives (From admission, onward)   Start     Dose/Rate Route Frequency Ordered Stop   12/18/20 0000  ceFAZolin (ANCEF) IVPB 1 g/50 mL premix  Status:  Discontinued  Note to Pharmacy: Send with pt to OR   1 g 100 mL/hr over 30 Minutes Intravenous On call 12/17/20 1511 12/17/20 1513   12/18/20 0000  ceFAZolin (ANCEF)  IVPB 2g/100 mL premix       Note to Pharmacy: Send with pt to OR   2 g 200 mL/hr over 30 Minutes Intravenous On call 12/17/20 1513 12/19/20 0000       Subjective: Patient seen and examined at bedside.  No overnight fever, chest pain, abdominal pain, worsening shortness of breath reported. Objective: Vitals:   12/22/20 1400 12/22/20 1427 12/22/20 2008 12/23/20 0414  BP: (!) 121/59 117/64 137/66 129/63  Pulse:    82  Resp: (!) 21 20 (!) 23 20  Temp:   97.9 F (36.6 C) 97.8 F (36.6 C)  TempSrc:   Oral Oral  SpO2:  96%  93%  Weight:  70.7 kg  71.1 kg  Height:        Intake/Output Summary (Last 24 hours) at 12/23/2020 0737 Last data filed at 12/22/2020 2300 Gross per 24 hour  Intake --  Output 1300 ml  Net -1300 ml   Filed Weights   12/22/20 1150 12/22/20 1427 12/23/20 0414  Weight: 71.7 kg 70.7 kg 71.1 kg    Examination:  General exam: Chronically ill looking.  Poor historian.  Currently on room air.   Respiratory system: Decreased breath sounds at bases bilaterally with some crackles heard Cardiovascular system: Rate controlled, S1-S2 heard  gastrointestinal system: Abdomen is nondistended, soft and nontender.  Bowel sounds are heard  extremities: Bilateral lower extremity edema present; no clubbing   Data Reviewed: I have personally reviewed following labs and imaging studies  CBC: Recent Labs  Lab 12/18/20 0003 12/18/20 1100 12/19/20 0324 12/19/20 0955 12/20/20 0520 12/20/20 0954 12/21/20 0155 12/21/20 1359 12/21/20 1806 12/22/20 0135 12/22/20 0944 12/22/20 1740 12/23/20 0203  WBC 8.5  --  8.6  --  8.1  --  8.7  --   --   --   --   --  7.7  NEUTROABS  --   --  7.4  --  7.0  --  7.6  --   --   --   --   --  6.2  HGB 11.2*   < > 11.2*   < > 10.3*   < > 9.9*   < > 9.7* 8.9* 8.8* 8.6* 8.3*  HCT 36.0*   < > 35.4*   < > 32.7*   < > 29.6*   < > 30.9* 27.9* 28.7* 28.2* 26.1*  MCV 74.8*  --  75.6*  --  75.2*  --  74.2*  --   --   --   --   --  77.0*  PLT 246   --  252  --  225  --  221  --   --   --   --   --  162   < > = values in this interval not displayed.   Basic Metabolic Panel: Recent Labs  Lab 12/18/20 0003 12/19/20 0324 12/20/20 0520 12/21/20 1507 12/23/20 0203  NA 129* 133* 130* 135 134*  K 3.9 4.0 4.0 4.1 4.0  CL 90* 90* 91* 96* 97*  CO2 25 22 21* 22 25  GLUCOSE 237* 172* 163* 166* 88  BUN 120* 134* 140* 145* 54*  CREATININE 5.35* 6.12* 6.58* 7.09* 4.31*  CALCIUM 8.5* 8.8* 8.5* 8.0* 8.1*  PHOS  --   --   --  10.2*  5.8*   GFR: Estimated Creatinine Clearance: 17.1 mL/min (A) (by C-G formula based on SCr of 4.31 mg/dL (H)). Liver Function Tests: Recent Labs  Lab 12/21/20 1507 12/23/20 0203  ALBUMIN 2.8* 2.6*   Recent Labs  Lab 12/18/20 0003  LIPASE 67*   No results for input(s): AMMONIA in the last 168 hours. Coagulation Profile: No results for input(s): INR, PROTIME in the last 168 hours. Cardiac Enzymes: No results for input(s): CKTOTAL, CKMB, CKMBINDEX, TROPONINI in the last 168 hours. BNP (last 3 results) No results for input(s): PROBNP in the last 8760 hours. HbA1C: No results for input(s): HGBA1C in the last 72 hours. CBG: Recent Labs  Lab 12/22/20 0810 12/22/20 1138 12/22/20 1640 12/22/20 2013 12/23/20 0734  GLUCAP 146* 244* 160* 138* 128*   Lipid Profile: No results for input(s): CHOL, HDL, LDLCALC, TRIG, CHOLHDL, LDLDIRECT in the last 72 hours. Thyroid Function Tests: No results for input(s): TSH, T4TOTAL, FREET4, T3FREE, THYROIDAB in the last 72 hours. Anemia Panel: Recent Labs    12/21/20 1806  FERRITIN 592*  TIBC 298  IRON 67   Sepsis Labs: Recent Labs  Lab 12/18/20 0003  LATICACIDVEN 1.0    Recent Results (from the past 240 hour(s))  Resp Panel by RT-PCR (Flu A&B, Covid) Nasopharyngeal Swab     Status: None   Collection Time: 12/14/20 11:00 AM   Specimen: Nasopharyngeal Swab; Nasopharyngeal(NP) swabs in vial transport medium  Result Value Ref Range Status   SARS Coronavirus  2 by RT PCR NEGATIVE NEGATIVE Final    Comment: (NOTE) SARS-CoV-2 target nucleic acids are NOT DETECTED.  The SARS-CoV-2 RNA is generally detectable in upper respiratory specimens during the acute phase of infection. The lowest concentration of SARS-CoV-2 viral copies this assay can detect is 138 copies/mL. A negative result does not preclude SARS-Cov-2 infection and should not be used as the sole basis for treatment or other patient management decisions. A negative result may occur with  improper specimen collection/handling, submission of specimen other than nasopharyngeal swab, presence of viral mutation(s) within the areas targeted by this assay, and inadequate number of viral copies(<138 copies/mL). A negative result must be combined with clinical observations, patient history, and epidemiological information. The expected result is Negative.  Fact Sheet for Patients:  EntrepreneurPulse.com.au  Fact Sheet for Healthcare Providers:  IncredibleEmployment.be  This test is no t yet approved or cleared by the [place] FDA and  has been authorized for detection and/or diagnosis of SARS-CoV-2 by FDA under an Emergency Use Authorization (EUA). This EUA will remain  in effect (meaning this test can be used) for the duration of the COVID-19 declaration under Section 564(b)(1) of the Act, 21 U.S.C.section 360bbb-3(b)(1), unless the authorization is terminated  or revoked sooner.       Influenza A by PCR NEGATIVE NEGATIVE Final   Influenza B by PCR NEGATIVE NEGATIVE Final    Comment: (NOTE) The Xpert Xpress SARS-CoV-2/FLU/RSV plus assay is intended as an aid in the diagnosis of influenza from Nasopharyngeal swab specimens and should not be used as a sole basis for treatment. Nasal washings and aspirates are unacceptable for Xpert Xpress SARS-CoV-2/FLU/RSV testing.  Fact Sheet for Patients: EntrepreneurPulse.com.au  Fact  Sheet for Healthcare Providers: IncredibleEmployment.be  This test is not yet approved or cleared by the [place] FDA and has been authorized for detection and/or diagnosis of SARS-CoV-2 by FDA under an Emergency Use Authorization (EUA). This EUA will remain in effect (meaning this test can be used) for the duration  of the COVID-19 declaration under Section 564(b)(1) of the Act, 21 U.S.C. section 360bbb-3(b)(1), unless the authorization is terminated or revoked.  Performed at [facility], [place] [address] [place], [place] [address]   Surgical PCR screen     Status: None   Collection Time: 12/18/20  6:34 AM   Specimen: Nasal Mucosa; Nasal Swab  Result Value Ref Range Status   MRSA, PCR NEGATIVE NEGATIVE Final   Staphylococcus aureus NEGATIVE NEGATIVE Final    Comment: (NOTE) The Xpert SA Assay (FDA approved for NASAL specimens in patients 14 years of age and older), is one component of a comprehensive surveillance program. It is not intended to diagnose infection nor to guide or monitor treatment. Performed at [facility], [place] [address], [place], [place] [address]          Radiology Studies: DG Chest Port 1 View  Result Date: 12/21/2020 CLINICAL DATA:  End-stage renal disease. Dialysis catheter placement. EXAM: PORTABLE CHEST 1 VIEW COMPARISON:  12/16/2020 FINDINGS: A right jugular catheter has been placed and terminates near the superior cavoatrial junction. The cardiac silhouette remains mildly enlarged. There is persistent elevation of the right hemidiaphragm. Patchy bilateral airspace disease, greatest in the lung bases, has not significantly changed. No sizable pleural effusion or pneumothorax is identified. IMPRESSION: 1. Interval right jugular catheter placement as above. 2. Unchanged bilateral airspace disease. Electronically Signed   By: Logan Bores M.D.   On: 12/21/2020 12:54   DG Fluoro Guide CV Line-No Report  Result Date:  12/21/2020 Fluoroscopy was utilized by the requesting physician.  No radiographic interpretation.        Scheduled Meds: . atorvastatin  40 mg Oral q1800  . Chlorhexidine Gluconate Cloth  6 each Topical Q0600  . Chlorhexidine Gluconate Cloth  6 each Topical Q0600  . citalopram  10 mg Oral Daily  . darbepoetin (ARANESP) injection - DIALYSIS  40 mcg Intravenous Q Tue-HD  . feeding supplement (NEPRO CARB STEADY)  237 mL Oral BID BM  . hydrALAZINE  100 mg Oral Q8H  . influenza vac split quadrivalent PF  0.5 mL Intramuscular Tomorrow-1000  . insulin aspart  0-9 Units Subcutaneous TID WC  . isosorbide mononitrate  60 mg Oral Daily  . labetalol  200 mg Oral BID  . melatonin  5 mg Oral QHS  . multivitamin  1 tablet Oral QHS  . pantoprazole  40 mg Oral BID  . pneumococcal 23 valent vaccine  0.5 mL Intramuscular Tomorrow-1000  . polyethylene glycol  17 g Oral Daily  . sevelamer carbonate  800 mg Oral TID WC  . sodium chloride flush  3 mL Intravenous Q12H   Continuous Infusions: . sodium chloride            Aline August, MD [facility] 12/23/2020, 7:37 AM

## 2020-12-23 NOTE — Progress Notes (Signed)
Physical Therapy Evaluation Patient Details Name: Ruben Camacho. MRN: [id number] DOB: February 18, 1956 Today's Date: 12/23/2020   History of Present Illness  Pt presented with SOB and found to have acute on chronic CHF, due to hypertensive urgency and AKI on CKD. Pt with coffee ground emesis with GI consulted and recommended EGD which pt declined. Pt underwent placement of tunneled HD catheter as well as new lt upper arm AV graft for initiation of HD on 2/21.  PMH - HTN, CKD, DM, gout, CVA  Clinical Impression  Pt was seen for limited evaluation due to his complaints of being dizzy upon sitting.  BP was controlled, but did have low O2 sats at 89% in bed and then 91% on side of bed on room air.  Talked with pt about his vitals not explaining the dizziness, and will follow up with MD to get a better idea of how this has been an issue.  BP sitting was 127/67, unable to stand with pt to get an upright pressure.  Follow along as tolerated and will progress to gait as he is able to allow.    Follow Up Recommendations Home health PT;Supervision - Intermittent    Equipment Recommendations  None recommended by PT    Recommendations for Other Services       Precautions / Restrictions Precautions Precautions: Fall Precaution Comments: reported dizziness Restrictions Weight Bearing Restrictions: No      Mobility  Bed Mobility Overal bed mobility: Needs Assistance Bed Mobility: Supine to Sit;Sit to Supine     Supine to sit: Supervision Sit to supine: Min guard   General bed mobility comments: pt is able to move but feeling dizzy so assisted wtih LE return to bed    Transfers                 General transfer comment: declined to stand due to feeling dizzy  Ambulation/Gait             General Gait Details: unable to attempt due to dizziness  Stairs            Wheelchair Mobility    Modified Rankin (Stroke Patients Only)       Balance Overall balance  assessment: Needs assistance Sitting-balance support: Feet supported Sitting balance-Leahy Scale: Good                                       Pertinent Vitals/Pain Pain Assessment: No/denies pain    Home Living Family/patient expects to be discharged to:: Private residence Living Arrangements: Alone Available Help at Discharge: Family;Friend(s);Available PRN/intermittently Type of Home: Apartment Home Access: Stairs to enter Entrance Stairs-Rails: Chemical engineer of Steps: 20 Home Layout: One level Home Equipment: None      Prior Function Level of Independence: Independent         Comments: has no adaptive or other equipment     Hand Dominance   Dominant Hand: Right    Extremity/Trunk Assessment   Upper Extremity Assessment Upper Extremity Assessment: Defer to OT evaluation    Lower Extremity Assessment Lower Extremity Assessment: Overall WFL for tasks assessed    Cervical / Trunk Assessment Cervical / Trunk Assessment: Normal  Communication   Communication: No difficulties  Cognition Arousal/Alertness: Awake/alert Behavior During Therapy: Anxious Overall Cognitive Status: Within Functional Limits for tasks assessed  General Comments General comments (skin integrity, edema, etc.): pt maintained O2 sats in 90's with mobility but initially 89%.  He increased to 91% sitting and BP in sitting was 127/67, pulse 77. No vitals were noted as source for sitting dizziness    Exercises     Assessment/Plan    PT Assessment Patient needs continued PT services  PT Problem List Decreased activity tolerance;Decreased mobility       PT Treatment Interventions DME instruction;Gait training;Stair training;Functional mobility training;Therapeutic activities;Therapeutic exercise;Balance training;Neuromuscular re-education;Patient/family education    PT Goals (Current goals can be  found in the Care Plan section)  Acute Rehab PT Goals Patient Stated Goal: none stated PT Goal Formulation: With patient Time For Goal Achievement: 01/07/21 Potential to Achieve Goals: Good    Frequency Min 3X/week   Barriers to discharge Inaccessible home environment has full set of stairs to get up to apt    Co-evaluation               AM-PAC PT "6 Clicks" Mobility  Outcome Measure Help needed turning from your back to your side while in a flat bed without using bedrails?: None Help needed moving from lying on your back to sitting on the side of a flat bed without using bedrails?: A Little Help needed moving to and from a bed to a chair (including a wheelchair)?: A Little Help needed standing up from a chair using your arms (e.g., wheelchair or bedside chair)?: A Little Help needed to walk in hospital room?: A Little Help needed climbing 3-5 steps with a railing? : A Lot 6 Click Score: 18    End of Session   Activity Tolerance: Other (comment) (dizzy in sitting) Patient left: in bed;with call bell/phone within reach;with bed alarm set Nurse Communication: Mobility status PT Visit Diagnosis: Difficulty in walking, not elsewhere classified (R26.2);Dizziness and giddiness (R42)    Time: [id number] PT Time Calculation (min) (ACUTE ONLY): 17 min   Charges:   PT Evaluation $PT Eval Moderate Complexity: 1 Mod         Ramond Dial 12/23/2020, 1:05 PM  Mee Hives, PT MS Acute Rehab Dept. Number: [place] [facility] [place]

## 2020-12-23 NOTE — Progress Notes (Signed)
Renal Navigator met with patient at bedside to discuss outpatient HD referral for AKI treatment per request of Dr. Royce Macadamia. Patient stated understanding and agreeable. He states he does not have transportation. Navigator able to assist and will evaluate if [facility] will travel to patient's home or if he will need to utilize [facility] transportation.  Referral submitted to [facility]missions and will follow for acceptance and for patient disposition.  Alphonzo Cruise, [place] Renal Navigator [phone]

## 2020-12-23 NOTE — Plan of Care (Signed)

## 2020-12-24 ENCOUNTER — Telehealth: Payer: Self-pay

## 2020-12-24 DIAGNOSIS — N184 Chronic kidney disease, stage 4 (severe): Secondary | ICD-10-CM | POA: Diagnosis not present

## 2020-12-24 DIAGNOSIS — N179 Acute kidney failure, unspecified: Secondary | ICD-10-CM | POA: Diagnosis not present

## 2020-12-24 DIAGNOSIS — I5033 Acute on chronic diastolic (congestive) heart failure: Secondary | ICD-10-CM | POA: Diagnosis not present

## 2020-12-24 LAB — CBC WITH DIFFERENTIAL/PLATELET
Abs Immature Granulocytes: 0.05 10*3/uL (ref 0.00–0.07)
Basophils Absolute: 0 10*3/uL (ref 0.0–0.1)
Basophils Relative: 0 %
Eosinophils Absolute: 0.1 10*3/uL (ref 0.0–0.5)
Eosinophils Relative: 2 %
HCT: 25.8 % — ABNORMAL LOW (ref 39.0–52.0)
Hemoglobin: 7.8 g/dL — ABNORMAL LOW (ref 13.0–17.0)
Immature Granulocytes: 1 %
Lymphocytes Relative: 12 %
Lymphs Abs: 0.8 10*3/uL (ref 0.7–4.0)
MCH: 23.6 pg — ABNORMAL LOW (ref 26.0–34.0)
MCHC: 30.2 g/dL (ref 30.0–36.0)
MCV: 77.9 fL — ABNORMAL LOW (ref 80.0–100.0)
Monocytes Absolute: 0.6 10*3/uL (ref 0.1–1.0)
Monocytes Relative: 9 %
Neutro Abs: 5.1 10*3/uL (ref 1.7–7.7)
Neutrophils Relative %: 76 %
Platelets: 155 10*3/uL (ref 150–400)
RBC: 3.31 MIL/uL — ABNORMAL LOW (ref 4.22–5.81)
RDW: 15.7 % — ABNORMAL HIGH (ref 11.5–15.5)
WBC: 6.7 10*3/uL (ref 4.0–10.5)
nRBC: 0 % (ref 0.0–0.2)

## 2020-12-24 LAB — RENAL FUNCTION PANEL
Albumin: 2.5 g/dL — ABNORMAL LOW (ref 3.5–5.0)
Anion gap: 13 (ref 5–15)
BUN: 73 mg/dL — ABNORMAL HIGH (ref 8–23)
CO2: 25 mmol/L (ref 22–32)
Calcium: 8.5 mg/dL — ABNORMAL LOW (ref 8.9–10.3)
Chloride: 94 mmol/L — ABNORMAL LOW (ref 98–111)
Creatinine, Ser: 5.22 mg/dL — ABNORMAL HIGH (ref 0.61–1.24)
GFR, Estimated: 11 mL/min — ABNORMAL LOW (ref >60)
Glucose, Bld: 117 mg/dL — ABNORMAL HIGH (ref 70–99)
Phosphorus: 6.6 mg/dL — ABNORMAL HIGH (ref 2.5–4.6)
Potassium: 3.8 mmol/L (ref 3.5–5.1)
Sodium: 132 mmol/L — ABNORMAL LOW (ref 135–145)

## 2020-12-24 LAB — GLUCOSE, CAPILLARY
Glucose-Capillary: 118 mg/dL — ABNORMAL HIGH (ref 70–99)
Glucose-Capillary: 152 mg/dL — ABNORMAL HIGH (ref 70–99)

## 2020-12-24 LAB — PARATHYROID HORMONE, INTACT (NO CA): PTH: 169 pg/mL — ABNORMAL HIGH (ref 15–65)

## 2020-12-24 MED ORDER — HEPARIN SODIUM (PORCINE) 1000 UNIT/ML IJ SOLN
1000.0000 [IU] | INTRAMUSCULAR | Status: DC | PRN
  Administered 2021-01-06: 1000 [IU] via INTRAVENOUS

## 2020-12-24 MED ORDER — HEPARIN SODIUM (PORCINE) 1000 UNIT/ML IJ SOLN
INTRAMUSCULAR | Status: AC
  Administered 2020-12-24: 3800 [IU] via INTRAVENOUS
  Filled 2020-12-24: qty 4
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
## 2020-12-24 NOTE — Progress Notes (Signed)
[facility] ROUNDING NOTE   Subjective:   Feels ok today.  Had 0.5 kg UOP over 2/23. Seen and examined on dialysis.  Procedure supervised. Tunneled catheter in use.  Blood pressure 148/70 and HR 70.  Tolerating goal.    Review of systems: Denies shortness of breath or chest pain Denies n/v  ---------------- Brief history: This is a 65 year old gentleman with history of diabetes hypertension which has been malignant times, hyperlipidemia history of stroke, gout stage IV chronic kidney disease with a baseline serum creatinine by 2.05 August 2020.  He presented with dyspnea and lower extremity swelling.  He was also found to have a blood pressure was elevated to [id number] systolic.  His blood sugar was 320 and creatinine elevated at 5 mg/dL with chest x-ray which was consistent with pulmonary edema.  There is reported history of nonsteroidal anti-inflammatory drug use. Urinalysis showed no white blood cells no red blood cells.  There was greater than 300 mg of proteinuria noted on 08/25/2020 however only 100 mg/dL noted on 12/15/2020.  Renal ultrasound showed increased bilateral renal echogenicity no hydronephrosis.  Kidney size appears to be small bilaterally.     Objective:  Vital signs in last 24 hours:  Temp:  [98 F (36.7 C)-98.4 F (36.9 C)] 98.3 F (36.8 C) (02/24 1120) Pulse Rate:  [75-80] 80 (02/24 1230) Resp:  [18-22] 20 (02/24 1230) BP: (123-162)/(60-97) 158/71 (02/24 1230) SpO2:  [93 %-94 %] 93 % (02/24 1120) Weight:  [71.3 kg-71.7 kg] 71.3 kg (02/24 1120)  Weight change: 0 kg Filed Weights   12/23/20 0414 12/24/20 0434 12/24/20 1120  Weight: 71.1 kg 71.7 kg 71.3 kg    Intake/Output: I/O last 3 completed shifts: In: 79 [P.O.:478] Out: 800 [Urine:800]   Intake/Output this shift:  Total I/O In: 240 [P.O.:240] Out: -   GEN: Thin, sitting in bed, nad    ENT: no nasal discharge, mmm EYES: no scleral icterus, eomi CV: S1S2 no rub PULM: clear and unlabored.   ABD: soft/NT/ND SKIN: no rashes or jaundice  EXT: no edema, warm and well perfused Access RIJ tunn catheter; lue AVF bruit and thrill   Basic Metabolic Panel: Recent Labs  Lab 12/19/20 0324 12/20/20 0520 12/21/20 1507 12/23/20 0203 12/24/20 0106  NA 133* 130* 135 134* 132*  K 4.0 4.0 4.1 4.0 3.8  CL 90* 91* 96* 97* 94*  CO2 22 21* '22 25 25  '$ GLUCOSE 172* 163* 166* 88 117*  BUN 134* 140* 145* 54* 73*  CREATININE 6.12* 6.58* 7.09* 4.31* 5.22*  CALCIUM 8.8* 8.5* 8.0* 8.1* 8.5*  PHOS  --   --  10.2* 5.8* 6.6*    Liver Function Tests: Recent Labs  Lab 12/21/20 1507 12/23/20 0203 12/24/20 0106  ALBUMIN 2.8* 2.6* 2.5*   CBC: Recent Labs  Lab 12/19/20 0324 12/19/20 0955 12/20/20 0520 12/20/20 0954 12/21/20 0155 12/21/20 1359 12/22/20 0135 12/22/20 0944 12/22/20 1740 12/23/20 0203 12/24/20 0106  WBC 8.6  --  8.1  --  8.7  --   --   --   --  7.7 6.7  NEUTROABS 7.4  --  7.0  --  7.6  --   --   --   --  6.2 5.1  HGB 11.2*   < > 10.3*   < > 9.9*   < > 8.9* 8.8* 8.6* 8.3* 7.8*  HCT 35.4*   < > 32.7*   < > 29.6*   < > 27.9* 28.7* 28.2* 26.1* 25.8*  MCV 75.6*  --  75.2*  --  74.2*  --   --   --   --  77.0* 77.9*  PLT 252  --  225  --  221  --   --   --   --  162 155   < > = values in this interval not displayed.    CBG: Recent Labs  Lab 12/23/20 0734 12/23/20 1127 12/23/20 1635 12/23/20 2102 12/24/20 0713  GLUCAP 128* 193* 147* 164* 118*    Microbiology: Results for orders placed or performed during the hospital encounter of 12/14/20  Resp Panel by RT-PCR (Flu A&B, Covid) Nasopharyngeal Swab     Status: None   Collection Time: 12/14/20 11:00 AM   Specimen: Nasopharyngeal Swab; Nasopharyngeal(NP) swabs in vial transport medium  Result Value Ref Range Status   SARS Coronavirus 2 by RT PCR NEGATIVE NEGATIVE Final    Comment: (NOTE) SARS-CoV-2 target nucleic acids are NOT DETECTED.  The SARS-CoV-2 RNA is generally detectable in upper respiratory specimens  during the acute phase of infection. The lowest concentration of SARS-CoV-2 viral copies this assay can detect is 138 copies/mL. A negative result does not preclude SARS-Cov-2 infection and should not be used as the sole basis for treatment or other patient management decisions. A negative result may occur with  improper specimen collection/handling, submission of specimen other than nasopharyngeal swab, presence of viral mutation(s) within the areas targeted by this assay, and inadequate number of viral copies(<138 copies/mL). A negative result must be combined with clinical observations, patient history, and epidemiological information. The expected result is Negative.  Fact Sheet for Patients:  EntrepreneurPulse.com.au  Fact Sheet for Healthcare Providers:  IncredibleEmployment.be  This test is no t yet approved or cleared by the [place] FDA and  has been authorized for detection and/or diagnosis of SARS-CoV-2 by FDA under an Emergency Use Authorization (EUA). This EUA will remain  in effect (meaning this test can be used) for the duration of the COVID-19 declaration under Section 564(b)(1) of the Act, 21 U.S.C.section 360bbb-3(b)(1), unless the authorization is terminated  or revoked sooner.       Influenza A by PCR NEGATIVE NEGATIVE Final   Influenza B by PCR NEGATIVE NEGATIVE Final    Comment: (NOTE) The Xpert Xpress SARS-CoV-2/FLU/RSV plus assay is intended as an aid in the diagnosis of influenza from Nasopharyngeal swab specimens and should not be used as a sole basis for treatment. Nasal washings and aspirates are unacceptable for Xpert Xpress SARS-CoV-2/FLU/RSV testing.  Fact Sheet for Patients: EntrepreneurPulse.com.au  Fact Sheet for Healthcare Providers: IncredibleEmployment.be  This test is not yet approved or cleared by the [place] FDA and has been authorized for detection  and/or diagnosis of SARS-CoV-2 by FDA under an Emergency Use Authorization (EUA). This EUA will remain in effect (meaning this test can be used) for the duration of the COVID-19 declaration under Section 564(b)(1) of the Act, 21 U.S.C. section 360bbb-3(b)(1), unless the authorization is terminated or revoked.  Performed at [facility], [place] [address], [place], [place] [address]   Surgical PCR screen     Status: None   Collection Time: 12/18/20  6:34 AM   Specimen: Nasal Mucosa; Nasal Swab  Result Value Ref Range Status   MRSA, PCR NEGATIVE NEGATIVE Final   Staphylococcus aureus NEGATIVE NEGATIVE Final    Comment: (NOTE) The Xpert SA Assay (FDA approved for NASAL specimens in patients 52 years of age and older), is one component of a comprehensive surveillance program. It is not  intended to diagnose infection nor to guide or monitor treatment. Performed at [facility], [place] [address], [place], [place] [address]      Imaging: No results found.   Medications:   . sodium chloride     . atorvastatin  40 mg Oral q1800  . Chlorhexidine Gluconate Cloth  6 each Topical Q0600  . Chlorhexidine Gluconate Cloth  6 each Topical Q0600  . Chlorhexidine Gluconate Cloth  6 each Topical Q0600  . citalopram  10 mg Oral Daily  . darbepoetin (ARANESP) injection - DIALYSIS  40 mcg Intravenous Q Tue-HD  . feeding supplement (NEPRO CARB STEADY)  237 mL Oral BID BM  . hydrALAZINE  100 mg Oral Q8H  . influenza vac split quadrivalent PF  0.5 mL Intramuscular Tomorrow-1000  . insulin aspart  0-9 Units Subcutaneous TID WC  . isosorbide mononitrate  60 mg Oral Daily  . labetalol  200 mg Oral BID  . melatonin  5 mg Oral QHS  . multivitamin  1 tablet Oral QHS  . pantoprazole  40 mg Oral BID  . pneumococcal 23 valent vaccine  0.5 mL Intramuscular Tomorrow-1000  . polyethylene glycol  17 g Oral Daily  . sevelamer carbonate  800 mg Oral TID WC  . sodium chloride flush  3 mL Intravenous  Q12H   sodium chloride, acetaminophen, benzonatate, chlorpheniramine-HYDROcodone, guaiFENesin-dextromethorphan, levalbuterol, LORazepam, ondansetron (ZOFRAN) IV, oxyCODONE-acetaminophen, sodium chloride flush  Assessment/ Plan:  1.AKI on CKD: Creatinine 2.6 in October 2021.  Rapidly progressing kidney loss.  Ultrasound with small kidneys.  Possible renal artery stenosis based on kidney size.  RIJ tunn catheter and LUE avf on 2/21 with vascular.  First HD on 2/21 - HD today and plan for HD on 2/26 - Monitor for signs of recovery - seems guarded - Have started CLIP process as AKI    2. Hypertension/volume  -malignant hypertension on arrival with flash pulmonary edema intermittently.  Blood pressure much improved.  Discontinued renal artery duplex as still not done and HTN controlled.  Stopped minoxidil.    3. Anemia CKD - s/p feraheme; aranesp 40 mcg once on 2/22   4.  Hypokalemia-resolved with supplementation  5.  A. Fib- could be causing some variable renal perfusion-cardiology has seen   6. Hyponatremia: Likely related to free water retention in setting of kidney disease.   7. Metabolic bone disease - intact PTH 169.  on sevelamer. On renal diet  Ruben Desanctis, MD 12/24/2020 1:13 PM

## 2020-12-24 NOTE — Progress Notes (Signed)
Physical Therapy Treatment Patient Details Name: Ruben Camacho. MRN: [id number] DOB: 07/15/1956 Today's Date: 12/24/2020    History of Present Illness Pt is a 65 y.o. male admitted 12/14/20 with SOB; workup for acute on chronic CHF, hypertensive urgency, AKI on CKD. Pt with coffee ground emesis; GI consult, but pt declined EGD. S/p tunneled RIJ HD cath and LUE AVG placement 2/21; HD initiated 2/21. PMH includes HTN, CKD, DM, CVA, gout.   PT Comments    Pt with flat affect, self-limiting behavior with c/o dizziness after 1x standing attempt. Pt required modA to stand with RW, fluctuating from min guard to maxA to maintain standing balance due to bilateral knee instability and c/o dizziness. Post-mobility BP 145/63. Pt adamantly declining further mobility attempts wanting to return to supine. Pt reports, "I can't go home like this... I can't care for myself." Agreeable to post-acute rehab at SNF to maximize functional mobility and independence prior to return home.   Follow Up Recommendations  SNF;Supervision for mobility/OOB     Equipment Recommendations  Rolling walker with 5" wheels;3in1 (PT)    Recommendations for Other Services       Precautions / Restrictions Precautions Precautions: Fall Precaution Comments: reported dizziness - does not seem orthostatic (post-standing BP 145/63) Restrictions Weight Bearing Restrictions: No    Mobility  Bed Mobility Overal bed mobility: Modified Independent Bed Mobility: Supine to Sit;Sit to Supine           General bed mobility comments: HOB elevated, initially declining dizziness once sitting EOB    Transfers Overall transfer level: Needs assistance Equipment used: Rolling walker (2 wheeled) Transfers: Sit to/from Stand Sit to Stand: Mod assist         General transfer comment: Pt attempting to stand without success, cues for hand placement, ultimately requiring modA for trunk elevation; max encouragement to maintain  standing as pt actively attempting to sit down despite cues; reports dizziness  Ambulation/Gait             General Gait Details: pt declined   Stairs             Wheelchair Mobility    Modified Rankin (Stroke Patients Only)       Balance Overall balance assessment: Needs assistance Sitting-balance support: Feet supported Sitting balance-Leahy Scale: Fair       Standing balance-Leahy Scale: Poor Standing balance comment: Reliant on UE support; inconsistent bilateral knee instability requiring min guard-maxA to maintain standing and prevent return to sitting                            Cognition Arousal/Alertness: Awake/alert Behavior During Therapy: Flat affect Overall Cognitive Status: No family/caregiver present to determine baseline cognitive functioning                                 General Comments: Flat affect, agreeable to participate but adamant against additional mobility attempts after 1x standing; difficult to reason with regarding importance of OOB; pt focused on feeling dizzy      Exercises      General Comments General comments (skin integrity, edema, etc.): BP 145/63 after standing attempt. Increased time discussing d/c recommendations - pt reports, "I can't go home like this... I can't care for myself." Discussed option for SNF-level therapies, which is pt's preference      Pertinent Vitals/Pain Pain Assessment: No/denies pain    Home  Living                      Prior Function            PT Goals (current goals can now be found in the care plan section) Acute Rehab PT Goals Patient Stated Goal: I just want to lay back down, I don't feel well Progress towards PT goals: Progressing toward goals (slowly)    Frequency    Min 2X/week      PT Plan Discharge plan needs to be updated;Frequency needs to be updated    Co-evaluation              AM-PAC PT "6 Clicks" Mobility   Outcome  Measure  Help needed turning from your back to your side while in a flat bed without using bedrails?: None Help needed moving from lying on your back to sitting on the side of a flat bed without using bedrails?: None Help needed moving to and from a bed to a chair (including a wheelchair)?: A Lot Help needed standing up from a chair using your arms (e.g., wheelchair or bedside chair)?: A Lot Help needed to walk in hospital room?: A Lot Help needed climbing 3-5 steps with a railing? : A Lot 6 Click Score: 16    End of Session Equipment Utilized During Treatment: Gait belt Activity Tolerance: Other (comment) (c/o dizziness, self-limiting) Patient left: in bed;with call bell/phone within reach;with bed alarm set Nurse Communication: Mobility status PT Visit Diagnosis: Difficulty in walking, not elsewhere classified (R26.2);Dizziness and giddiness (R42)     Time: [id number] PT Time Calculation (min) (ACUTE ONLY): 19 min  Charges:  $Therapeutic Activity: 8-22 mins                     Mabeline Caras, PT, DPT Acute Rehabilitation Services  Pager [phone] Office [place] 12/24/2020, 9:23 AM

## 2020-12-24 NOTE — Progress Notes (Signed)
Patient ID: Ruben Chuang., male   DOB: Nov 07, 1955, 65 y.o.   MRN: [id number]  PROGRESS NOTE    Ruben Camacho.  [id number] DOB: 05-Jul-1956 DOA: 12/14/2020 PCP: Ruben Pollen, MD   Brief Narrative:  65 year old male with history of hypertension, chronic kidney disease stage IV, diabetes mellitus type 2, hyperlipidemia presented with worsening shortness of breath.  On presentation, patient was found to have pulmonary edema along with acute kidney injury on chronic kidney disease with creatinine of five compared to baseline of 2.6 along with elevated troponin and chest x-ray showing pulmonary edema.  He was started on intravenous Lasix.  Nephrology was also consulted.  Subsequently, he had coffee-ground emesis for which heparin and aspirin were stopped and he was started on Protonix.  GI recommended EGD but patient refused.  During the hospitalization, renal function did not improve and patient was started on hemodialysis on 12/21/2020.  Assessment & Plan:   Acute kidney injury on chronic renal disease stage IV -Baseline creatinine 2.6.  Presented with creatinine of 5.  Treated with intravenous diuretics but kidney function did not improve.  Renal ultrasound showed small kidneys -Nephrology following.  Patient was started on hemodialysis on 12/21/2020 -He underwent tunneled dialysis catheter placement and new left upper arm AV graft on 12/21/2020 by vascular surgery.  Outpatient follow-up with vascular surgery -Will need outpatient hemodialysis unit arrangement  Acute on chronic diastolic CHF Hypertensive emergency Elevated troponins: Most likely from demand ischemia -Patient initially required Cardene drip which was subsequently discontinued. -Blood pressure has improved and intermittently still on the higher side.  Continue isosorbide mononitrate, labetalol, minoxidil -Volume currently being managed by dialysis by nephrology.  Cardiology has signed off. -Strict input and output.   Daily weights.  Fluid restriction. -Echo showed EF of 65 to 70%.  Continue statins  Acute hypoxic respiratory failure -Resolved.  Currently on room air.  Off steroids.  Continue incentive spirometry and as needed nebs  Possible upper GI bleeding -Patient had coffee-ground emesis during this hospitalization. -heparin and aspirin were stopped and he was started on Protonix.  GI recommended EGD but patient refused.   -Subsequently, hemoglobin has stabilized.  Continue twice a day oral Protonix.  Anxiety/depression -Continue Celexa  Hyponatremia -Now being managed by dialysis by nephrology.    Chronic microcytic anemia -Hemoglobin stable.  Monitor intermittently  Diabetes mellitus type 2 with hyperglycemia -Oral meds on hold.  Continue CBGs with SSI  Severe malnutrition -Follow nutrition recommendation  Generalized conditioning -Follow PT recommendations.   DVT prophylaxis: SCDs Code Status: Full Family Communication: None Disposition Plan: Status is: Inpatient  Remains inpatient appropriate because:Hemodynamically unstable and Inpatient level of care appropriate due to severity of illness. Will need outpatient hemodialysis unit arrangement    Dispo: The patient is from: Home              Anticipated d/c is to: SNF              Anticipated d/c date is: 1 day              Patient currently is medically stable to d/c.   Difficult to place patient No   Consultants: Nephrology/vascular surgery/cardiology/GI  Procedures:  tunneled dialysis catheter placement and new left upper arm AV graft on 12/21/2020 by vascular surgery Antimicrobials:  Anti-infectives (From admission, onward)   Start     Dose/Rate Route Frequency Ordered Stop   12/18/20 0000  ceFAZolin (ANCEF) IVPB 1 g/50 mL premix  Status:  Discontinued  Note to Pharmacy: Send with pt to OR   1 g 100 mL/hr over 30 Minutes Intravenous On call 12/17/20 1511 12/17/20 1513   12/18/20 0000  ceFAZolin (ANCEF) IVPB  2g/100 mL premix       Note to Pharmacy: Send with pt to OR   2 g 200 mL/hr over 30 Minutes Intravenous On call 12/17/20 1513 12/19/20 0000       Subjective: Patient seen and examined at bedside.  Poor historian.  Denies worsening shortness with, abdominal pain, nausea, vomiting or fever.   Objective: Vitals:   12/23/20 0414 12/23/20 1337 12/23/20 2056 12/24/20 0434  BP: 129/63 123/61 130/60 (!) 138/97  Pulse: 82  75 76  Resp: '20 18 18 18  '$ Temp: 97.8 F (36.6 C) 98 F (36.7 C) 98.1 F (36.7 C) 98.4 F (36.9 C)  TempSrc: Oral Oral Oral Oral  SpO2: 93%  94% 94%  Weight: 71.1 kg   71.7 kg  Height:        Intake/Output Summary (Last 24 hours) at 12/24/2020 0759 Last data filed at 12/24/2020 0525 Gross per 24 hour  Intake 478 ml  Output 500 ml  Net -22 ml   Filed Weights   12/22/20 1427 12/23/20 0414 12/24/20 0434  Weight: 70.7 kg 71.1 kg 71.7 kg    Examination:  General exam: No acute distress.  On room air currently.  Looking chronically.  Poor historian Respiratory system: Bilateral decreased breath sounds at bases with some crackles, no wheezing Cardiovascular system: S1-S2 heard, rate controlled gastrointestinal system: Abdomen is nondistended, soft and nontender.  Normal bowel sounds heard  extremities: No cyanosis; mild lower extremity edema present   Data Reviewed: I have personally reviewed following labs and imaging studies  CBC: Recent Labs  Lab 12/19/20 0324 12/19/20 0955 12/20/20 0520 12/20/20 0954 12/21/20 0155 12/21/20 1359 12/22/20 0135 12/22/20 0944 12/22/20 1740 12/23/20 0203 12/24/20 0106  WBC 8.6  --  8.1  --  8.7  --   --   --   --  7.7 6.7  NEUTROABS 7.4  --  7.0  --  7.6  --   --   --   --  6.2 5.1  HGB 11.2*   < > 10.3*   < > 9.9*   < > 8.9* 8.8* 8.6* 8.3* 7.8*  HCT 35.4*   < > 32.7*   < > 29.6*   < > 27.9* 28.7* 28.2* 26.1* 25.8*  MCV 75.6*  --  75.2*  --  74.2*  --   --   --   --  77.0* 77.9*  PLT 252  --  225  --  221  --   --    --   --  162 155   < > = values in this interval not displayed.   Basic Metabolic Panel: Recent Labs  Lab 12/19/20 0324 12/20/20 0520 12/21/20 1507 12/23/20 0203 12/24/20 0106  NA 133* 130* 135 134* 132*  K 4.0 4.0 4.1 4.0 3.8  CL 90* 91* 96* 97* 94*  CO2 22 21* '22 25 25  '$ GLUCOSE 172* 163* 166* 88 117*  BUN 134* 140* 145* 54* 73*  CREATININE 6.12* 6.58* 7.09* 4.31* 5.22*  CALCIUM 8.8* 8.5* 8.0* 8.1* 8.5*  PHOS  --   --  10.2* 5.8* 6.6*   GFR: Estimated Creatinine Clearance: 14.1 mL/min (A) (by C-G formula based on SCr of 5.22 mg/dL (H)). Liver Function Tests: Recent Labs  Lab 12/21/20 1507 12/23/20 0203 12/24/20 0106  ALBUMIN 2.8* 2.6* 2.5*   Recent Labs  Lab 12/18/20 0003  LIPASE 67*   No results for input(s): AMMONIA in the last 168 hours. Coagulation Profile: No results for input(s): INR, PROTIME in the last 168 hours. Cardiac Enzymes: No results for input(s): CKTOTAL, CKMB, CKMBINDEX, TROPONINI in the last 168 hours. BNP (last 3 results) No results for input(s): PROBNP in the last 8760 hours. HbA1C: No results for input(s): HGBA1C in the last 72 hours. CBG: Recent Labs  Lab 12/23/20 0734 12/23/20 1127 12/23/20 1635 12/23/20 2102 12/24/20 0713  GLUCAP 128* 193* 147* 164* 118*   Lipid Profile: No results for input(s): CHOL, HDL, LDLCALC, TRIG, CHOLHDL, LDLDIRECT in the last 72 hours. Thyroid Function Tests: No results for input(s): TSH, T4TOTAL, FREET4, T3FREE, THYROIDAB in the last 72 hours. Anemia Panel: Recent Labs    12/21/20 1806  FERRITIN 592*  TIBC 298  IRON 67   Sepsis Labs: Recent Labs  Lab 12/18/20 0003  LATICACIDVEN 1.0    Recent Results (from the past 240 hour(s))  Resp Panel by RT-PCR (Flu A&B, Covid) Nasopharyngeal Swab     Status: None   Collection Time: 12/14/20 11:00 AM   Specimen: Nasopharyngeal Swab; Nasopharyngeal(NP) swabs in vial transport medium  Result Value Ref Range Status   SARS Coronavirus 2 by RT PCR  NEGATIVE NEGATIVE Final    Comment: (NOTE) SARS-CoV-2 target nucleic acids are NOT DETECTED.  The SARS-CoV-2 RNA is generally detectable in upper respiratory specimens during the acute phase of infection. The lowest concentration of SARS-CoV-2 viral copies this assay can detect is 138 copies/mL. A negative result does not preclude SARS-Cov-2 infection and should not be used as the sole basis for treatment or other patient management decisions. A negative result may occur with  improper specimen collection/handling, submission of specimen other than nasopharyngeal swab, presence of viral mutation(s) within the areas targeted by this assay, and inadequate number of viral copies(<138 copies/mL). A negative result must be combined with clinical observations, patient history, and epidemiological information. The expected result is Negative.  Fact Sheet for Patients:  EntrepreneurPulse.com.au  Fact Sheet for Healthcare Providers:  IncredibleEmployment.be  This test is no t yet approved or cleared by the [place] FDA and  has been authorized for detection and/or diagnosis of SARS-CoV-2 by FDA under an Emergency Use Authorization (EUA). This EUA will remain  in effect (meaning this test can be used) for the duration of the COVID-19 declaration under Section 564(b)(1) of the Act, 21 U.S.C.section 360bbb-3(b)(1), unless the authorization is terminated  or revoked sooner.       Influenza A by PCR NEGATIVE NEGATIVE Final   Influenza B by PCR NEGATIVE NEGATIVE Final    Comment: (NOTE) The Xpert Xpress SARS-CoV-2/FLU/RSV plus assay is intended as an aid in the diagnosis of influenza from Nasopharyngeal swab specimens and should not be used as a sole basis for treatment. Nasal washings and aspirates are unacceptable for Xpert Xpress SARS-CoV-2/FLU/RSV testing.  Fact Sheet for Patients: EntrepreneurPulse.com.au  Fact Sheet for  Healthcare Providers: IncredibleEmployment.be  This test is not yet approved or cleared by the [place] FDA and has been authorized for detection and/or diagnosis of SARS-CoV-2 by FDA under an Emergency Use Authorization (EUA). This EUA will remain in effect (meaning this test can be used) for the duration of the COVID-19 declaration under Section 564(b)(1) of the Act, 21 U.S.C. section 360bbb-3(b)(1), unless the authorization is terminated or revoked.  Performed at [facility], [place]  [address] [place], [place] [address]   Surgical PCR screen     Status: None   Collection Time: 12/18/20  6:34 AM   Specimen: Nasal Mucosa; Nasal Swab  Result Value Ref Range Status   MRSA, PCR NEGATIVE NEGATIVE Final   Staphylococcus aureus NEGATIVE NEGATIVE Final    Comment: (NOTE) The Xpert SA Assay (FDA approved for NASAL specimens in patients 46 years of age and older), is one component of a comprehensive surveillance program. It is not intended to diagnose infection nor to guide or monitor treatment. Performed at [facility], [facility] [address] [place], [place] [address]          Radiology Studies: No results found.      Scheduled Meds: . atorvastatin  40 mg Oral q1800  . Chlorhexidine Gluconate Cloth  6 each Topical Q0600  . Chlorhexidine Gluconate Cloth  6 each Topical Q0600  . Chlorhexidine Gluconate Cloth  6 each Topical Q0600  . citalopram  10 mg Oral Daily  . darbepoetin (ARANESP) injection - DIALYSIS  40 mcg Intravenous Q Tue-HD  . feeding supplement (NEPRO CARB STEADY)  237 mL Oral BID BM  . hydrALAZINE  100 mg Oral Q8H  . influenza vac split quadrivalent PF  0.5 mL Intramuscular Tomorrow-1000  . insulin aspart  0-9 Units Subcutaneous TID WC  . isosorbide mononitrate  60 mg Oral Daily  . labetalol  200 mg Oral BID  . melatonin  5 mg Oral QHS  . multivitamin  1 tablet Oral QHS  . pantoprazole  40 mg Oral BID  . pneumococcal 23 valent  vaccine  0.5 mL Intramuscular Tomorrow-1000  . polyethylene glycol  17 g Oral Daily  . sevelamer carbonate  800 mg Oral TID WC  . sodium chloride flush  3 mL Intravenous Q12H   Continuous Infusions: . sodium chloride            Aline August, MD [facility] 12/24/2020, 7:59 AM

## 2020-12-24 NOTE — NC FL2 (Signed)
[place] LEVEL OF CARE SCREENING TOOL     IDENTIFICATION  Patient Name: Ruben Camacho. Birthdate: 01-Oct-1956 Sex: male Admission Date (Current Location): 12/14/2020  [facility] and [place] Number:  Herbalist and Address:  [facility] [place]. [facility], [place] [address], [place],  [address]      Provider Number: [id number]  Attending Physician Name and Address:  Aline August, MD  Relative Name and Phone Number:  sister Stanton Kidney) [phone]    Current Level of Care: SNF Recommended Level of Care: [place] Prior Approval Number:    Date Approved/Denied:   PASRR Number: [id number]  Discharge Plan: SNF    Current Diagnoses: Patient Active Problem List   Diagnosis Date Noted  . Protein-calorie malnutrition, severe 12/15/2020  . CHF (congestive heart failure) ([place]) 12/14/2020  . Hypertensive emergency   . Stage 3b chronic kidney disease ([place]) 10/14/2019  . Vitamin D deficiency 08/09/2019  . Type 2 diabetes mellitus without complication, without long-term current use of insulin ([place]) 07/25/2019  . AKI (acute kidney injury) ([place]) 06/11/2019  . Hypertension associated with chronic kidney disease due to type 2 diabetes mellitus ([place]) 11/30/2011  . Hyperlipemia 11/30/2011  . Gout 11/30/2011  . Beta thalassemia ([place]) 11/30/2011    Orientation RESPIRATION BLADDER Height & Weight     Self,Time,Situation,Place  Normal Incontinent,External catheter Weight: 158 lb 1.1 oz (71.7 kg) Height:  '5\' 9"'$  (175.3 cm)  BEHAVIORAL SYMPTOMS/MOOD NEUROLOGICAL BOWEL NUTRITION STATUS      Incontinent Diet (See DC summary)  AMBULATORY STATUS COMMUNICATION OF NEEDS Skin   Limited Assist Verbally Surgical wounds (Closed incision left arm, closed incision right)                       Personal Care Assistance Level of Assistance  Bathing,Feeding,Dressing Bathing Assistance: Limited assistance Feeding assistance: Independent Dressing  Assistance: Limited assistance     Functional Limitations Info  Sight,Hearing,Speech Sight Info: Impaired Hearing Info: Adequate Speech Info: Adequate    SPECIAL CARE FACTORS FREQUENCY  PT (By licensed PT),OT (By licensed OT)     PT Frequency: 5X per week OT Frequency: 5X per week            Contractures      Additional Factors Info  Code Status,Allergies,Insulin Sliding Scale Code Status Info: FULL Allergies Info: NKA   Insulin Sliding Scale Info: See DC summary       Current Medications (12/24/2020):  This is the current hospital active medication list Current Facility-Administered Medications  Medication Dose Route Frequency Provider Last Rate Last Admin  . 0.9 %  sodium chloride infusion  250 mL Intravenous PRN Angelia Mould, MD      . acetaminophen (TYLENOL) tablet 650 mg  650 mg Oral Q4H PRN Angelia Mould, MD   650 mg at 12/23/20 1834  . atorvastatin (LIPITOR) tablet 40 mg  40 mg Oral q1800 Angelia Mould, MD   40 mg at 12/23/20 1640  . benzonatate (TESSALON) capsule 200 mg  200 mg Oral TID PRN Angelia Mould, MD      . Chlorhexidine Gluconate Cloth 2 % PADS 6 each  6 each Topical Q0600 Angelia Mould, MD   6 each at 12/24/20 0518  . Chlorhexidine Gluconate Cloth 2 % PADS 6 each  6 each Topical Q0600 Claudia Desanctis, MD   6 each at 12/24/20 0518  . Chlorhexidine Gluconate Cloth 2 % PADS 6 each  6  each Topical Q0600 Claudia Desanctis, MD   6 each at 12/24/20 0518  . chlorpheniramine-HYDROcodone (TUSSIONEX) 10-8 MG/5ML suspension 5 mL  5 mL Oral Q12H PRN Angelia Mould, MD      . citalopram (CELEXA) tablet 10 mg  10 mg Oral Daily Angelia Mould, MD   10 mg at 12/24/20 0844  . Darbepoetin Alfa (ARANESP) injection 40 mcg  40 mcg Intravenous Q Tue-HD Alekh, Kshitiz, MD      . feeding supplement (NEPRO CARB STEADY) liquid 237 mL  237 mL Oral BID BM Starla Link, Kshitiz, MD   237 mL at 12/24/20 0846  .  guaiFENesin-dextromethorphan (ROBITUSSIN DM) 100-10 MG/5ML syrup 5 mL  5 mL Oral Q4H PRN Angelia Mould, MD      . hydrALAZINE (APRESOLINE) tablet 100 mg  100 mg Oral Q8H Angelia Mould, MD   100 mg at 12/24/20 0517  . influenza vac split quadrivalent PF (FLUARIX) injection 0.5 mL  0.5 mL Intramuscular Tomorrow-1000 Wynetta Fines T, MD      . insulin aspart (novoLOG) injection 0-9 Units  0-9 Units Subcutaneous TID WC Angelia Mould, MD   1 Units at 12/23/20 1640  . isosorbide mononitrate (IMDUR) 24 hr tablet 60 mg  60 mg Oral Daily Angelia Mould, MD   60 mg at 12/24/20 0844  . labetalol (NORMODYNE) tablet 200 mg  200 mg Oral BID Angelia Mould, MD   200 mg at 12/24/20 0844  . levalbuterol (XOPENEX) nebulizer solution 0.63 mg  0.63 mg Nebulization Q8H PRN Angelia Mould, MD   0.63 mg at 12/24/20 0846  . LORazepam (ATIVAN) tablet 0.5 mg  0.5 mg Oral Q6H PRN Angelia Mould, MD   0.5 mg at 12/17/20 2200  . melatonin tablet 5 mg  5 mg Oral QHS Angelia Mould, MD   5 mg at 12/23/20 2239  . multivitamin (RENA-VIT) tablet 1 tablet  1 tablet Oral QHS Aline August, MD   1 tablet at 12/23/20 2239  . ondansetron (ZOFRAN) injection 4 mg  4 mg Intravenous Q6H PRN Angelia Mould, MD   4 mg at 12/18/20 0947  . oxyCODONE-acetaminophen (PERCOCET/ROXICET) 5-325 MG per tablet 1-2 tablet  1-2 tablet Oral Q4H PRN Angelia Mould, MD   1 tablet at 12/23/20 1834  . pantoprazole (PROTONIX) EC tablet 40 mg  40 mg Oral BID Swayze, Ava, DO   40 mg at 12/24/20 0846  . pneumococcal 23 valent vaccine (PNEUMOVAX-23) injection 0.5 mL  0.5 mL Intramuscular Tomorrow-1000 Wynetta Fines T, MD      . polyethylene glycol (MIRALAX / GLYCOLAX) packet 17 g  17 g Oral Daily Angelia Mould, MD   17 g at 12/22/20 [id number]  . sevelamer carbonate (RENVELA) tablet 800 mg  800 mg Oral TID WC Claudia Desanctis, MD   800 mg at 12/24/20 0844  . sodium chloride flush (NS)  0.9 % injection 3 mL  3 mL Intravenous Q12H Angelia Mould, MD   3 mL at 12/24/20 0848  . sodium chloride flush (NS) 0.9 % injection 3 mL  3 mL Intravenous PRN Angelia Mould, MD         Discharge Medications: Please see discharge summary for a list of discharge medications.  Relevant Imaging Results:  Relevant Lab Results:   Additional Information SSN: [id number] will need transport to dialysis  Glennon Hamilton, [place] [employer]

## 2020-12-25 DIAGNOSIS — N179 Acute kidney failure, unspecified: Secondary | ICD-10-CM | POA: Diagnosis not present

## 2020-12-25 DIAGNOSIS — I132 Hypertensive heart and chronic kidney disease with heart failure and with stage 5 chronic kidney disease, or end stage renal disease: Secondary | ICD-10-CM

## 2020-12-25 DIAGNOSIS — I998 Other disorder of circulatory system: Secondary | ICD-10-CM

## 2020-12-25 DIAGNOSIS — N186 End stage renal disease: Secondary | ICD-10-CM

## 2020-12-25 DIAGNOSIS — I70229 Atherosclerosis of native arteries of extremities with rest pain, unspecified extremity: Secondary | ICD-10-CM

## 2020-12-25 DIAGNOSIS — Z992 Dependence on renal dialysis: Secondary | ICD-10-CM

## 2020-12-25 DIAGNOSIS — E785 Hyperlipidemia, unspecified: Secondary | ICD-10-CM

## 2020-12-25 DIAGNOSIS — G894 Chronic pain syndrome: Secondary | ICD-10-CM

## 2020-12-25 DIAGNOSIS — I5042 Chronic combined systolic (congestive) and diastolic (congestive) heart failure: Secondary | ICD-10-CM

## 2020-12-25 DIAGNOSIS — I2510 Atherosclerotic heart disease of native coronary artery without angina pectoris: Secondary | ICD-10-CM

## 2020-12-25 DIAGNOSIS — I5033 Acute on chronic diastolic (congestive) heart failure: Secondary | ICD-10-CM | POA: Diagnosis not present

## 2020-12-25 LAB — RENAL FUNCTION PANEL
Albumin: 2.6 g/dL — ABNORMAL LOW (ref 3.5–5.0)
Anion gap: 14 (ref 5–15)
BUN: 45 mg/dL — ABNORMAL HIGH (ref 8–23)
CO2: 25 mmol/L (ref 22–32)
Calcium: 8.4 mg/dL — ABNORMAL LOW (ref 8.9–10.3)
Chloride: 95 mmol/L — ABNORMAL LOW (ref 98–111)
Creatinine, Ser: 4.32 mg/dL — ABNORMAL HIGH (ref 0.61–1.24)
GFR, Estimated: 14 mL/min — ABNORMAL LOW (ref >60)
Glucose, Bld: 111 mg/dL — ABNORMAL HIGH (ref 70–99)
Phosphorus: 4.6 mg/dL (ref 2.5–4.6)
Potassium: 4.2 mmol/L (ref 3.5–5.1)
Sodium: 134 mmol/L — ABNORMAL LOW (ref 135–145)

## 2020-12-25 LAB — GLUCOSE, CAPILLARY
Glucose-Capillary: 157 mg/dL — ABNORMAL HIGH (ref 70–99)
Glucose-Capillary: 144 mg/dL — ABNORMAL HIGH (ref 70–99)
Glucose-Capillary: 128 mg/dL — ABNORMAL HIGH (ref 70–99)
Glucose-Capillary: 198 mg/dL — ABNORMAL HIGH (ref 70–99)

## 2020-12-25 MED ORDER — KIDNEY FAILURE BOOK: Freq: Once | Status: AC

## 2020-12-25 MED ORDER — CHLORHEXIDINE GLUCONATE CLOTH 2 % EX PADS
6.0000 | MEDICATED_PAD | Freq: Every day | CUTANEOUS | Status: DC
  Administered 2020-12-26 – 2020-12-28 (×3): 6 via TOPICAL

## 2020-12-25 NOTE — Progress Notes (Signed)
Rounded on patient today in correlation to transition to outpatient HD for AKI needs. Patient was found lying in bed and agreeable to conversation. Ordered consult to dietician and Kidney Failure book.   Patient educated at the bedside regarding care of tunneled dialysis catheter, AV fistula site care, assessment of thrill daily, given a disposable stethoscope and proper medication administration on HD days.  Patient also educated on the importance of adhering to scheduled dialysis treatments, the effects of fluid overload, hyperkalemia and hyperphosphatemia. Patient capable of re-verbalizing via teach back method. Patient also educated on the different modalities in which dialysis is performed.  Patient reports that he lives alone and that his closest child is his daughter in [place]. He believes this change may cause him to move closer to her and not be by himself. Patient with no further questions at this time. Handouts and contact information provided to patient for any further assistance. Will follow as appropriate.   Dorthey Sawyer, RN  Dialysis Nurse Coordinator Phone: [phone]

## 2020-12-25 NOTE — Care Management Important Message (Signed)
Important Message  Patient Details  Name: Ruben Camacho. MRN: [id number] Date of Birth: 31-May-1956   Medicare Important Message Given:  Yes - Important Message mailed due to current National Emergency  Verbal consent obtained due to current National Emergency  Relationship to patient: Self Contact Name: Yeiren Call Date: 12/25/20  Time: 1421 Phone: [phone] Outcome: No Answer/Busy Important Message mailed to: Patient address on file    Delorse Lek 12/25/2020, 2:21 PM

## 2020-12-25 NOTE — Progress Notes (Signed)
OT Cancellation Note  Patient Details Name: Ruben Camacho. MRN: [id number] DOB: June 24, 1956   Cancelled Treatment:    Reason Eval/Treat Not Completed: Fatigue/lethargy limiting ability to participate.  Pt adamantly refused OT despite max encouragement.  He was informed of secondary effects of immobility, but still refused. He states he will work with his daughter this weekend when she visits.   Will follow up next week.  Nilsa Nutting., OTR/L Acute Rehabilitation Services Pager [phone] Office [phone]   Lucille Passy M 12/25/2020, 4:20 PM

## 2020-12-25 NOTE — Plan of Care (Signed)

## 2020-12-25 NOTE — Progress Notes (Signed)
Patient ID: Ruben Camacho., male   DOB: 08-Jun-1956, 65 y.o.   MRN: [id number]  PROGRESS NOTE    Ruben Camacho.  [id number] DOB: 1956-01-05 DOA: 12/14/2020 PCP: Ruben Pollen, MD   Brief Narrative:  65 year old male with history of hypertension, chronic kidney disease stage IV, diabetes mellitus type 2, hyperlipidemia presented with worsening shortness of breath.  On presentation, patient was found to have pulmonary edema along with acute kidney injury on chronic kidney disease with creatinine of five compared to baseline of 2.6 along with elevated troponin and chest x-ray showing pulmonary edema.  He was started on intravenous Lasix.  Nephrology was also consulted.  Subsequently, he had coffee-ground emesis for which heparin and aspirin were stopped and he was started on Protonix.  GI recommended EGD but patient refused.  During the hospitalization, renal function did not improve and patient was started on hemodialysis on 12/21/2020.  Assessment & Plan:   Acute kidney injury on chronic renal disease stage IV -Baseline creatinine 2.6.  Presented with creatinine of 5.  Treated with intravenous diuretics but kidney function did not improve.  Renal ultrasound showed small kidneys -Nephrology following.  Patient was started on hemodialysis on 12/21/2020 -He underwent tunneled dialysis catheter placement and new left upper arm AV graft on 12/21/2020 by vascular surgery.  Outpatient follow-up with vascular surgery -Will need outpatient hemodialysis unit arrangement  Acute on chronic diastolic CHF Hypertensive emergency Elevated troponins: Most likely from demand ischemia -Patient initially required Cardene drip which was subsequently discontinued. -Blood pressure has improved and intermittently still on the higher side.  Continue isosorbide mononitrate, labetalol, minoxidil -Volume currently being managed by dialysis by nephrology.  Cardiology has signed off. -Strict input and output.   Daily weights.  Fluid restriction. -Echo showed EF of 65 to 70%.  Continue statins  Acute hypoxic respiratory failure -Resolved.  Currently on room air.  Off steroids.  Continue incentive spirometry and as needed nebs  Possible upper GI bleeding -Patient had coffee-ground emesis during this hospitalization. -heparin and aspirin were stopped and he was started on Protonix.  GI recommended EGD but patient refused.   -Subsequently, hemoglobin has stabilized.  Continue twice a day oral Protonix.  Anxiety/depression -Continue Celexa  Hyponatremia -Now being managed by dialysis by nephrology.    Chronic microcytic anemia -Hemoglobin stable.  Monitor intermittently  Diabetes mellitus type 2 with hyperglycemia -Oral meds on hold.  Continue CBGs with SSI  Severe malnutrition -Follow nutrition recommendation  Generalized conditioning -PT recommends SNF placement.  Social worker following.  DVT prophylaxis: SCDs Code Status: Full Family Communication: None Disposition Plan: Status is: Inpatient  Remains inpatient appropriate because:Hemodynamically unstable and Inpatient level of care appropriate due to severity of illness. Will need outpatient hemodialysis unit arrangement    Dispo: The patient is from: Home              Anticipated d/c is to: SNF              Anticipated d/c date is: 1 day              Patient currently is medically stable to d/c.   Difficult to place patient No   Consultants: Nephrology/vascular surgery/cardiology/GI  Procedures:  tunneled dialysis catheter placement and new left upper arm AV graft on 12/21/2020 by vascular surgery Antimicrobials:  Anti-infectives (From admission, onward)   Start     Dose/Rate Route Frequency Ordered Stop   12/18/20 0000  ceFAZolin (ANCEF) IVPB 1 g/50 mL premix  Status:  Discontinued       Note to Pharmacy: Send with pt to OR   1 g 100 mL/hr over 30 Minutes Intravenous On call 12/17/20 1511 12/17/20 1513   12/18/20  0000  ceFAZolin (ANCEF) IVPB 2g/100 mL premix       Note to Pharmacy: Send with pt to OR   2 g 200 mL/hr over 30 Minutes Intravenous On call 12/17/20 1513 12/19/20 0000       Subjective: Patient seen and examined at bedside.  No overnight fever, vomiting, worsening shortness of breath reported. Objective: Vitals:   12/24/20 2116 12/24/20 2121 12/25/20 0557 12/25/20 0559  BP: 133/64  137/73   Pulse: 81     Resp:      Temp:  98.5 F (36.9 C)    TempSrc:  Oral    SpO2: 94%     Weight:    70.8 kg  Height:        Intake/Output Summary (Last 24 hours) at 12/25/2020 0753 Last data filed at 12/24/2020 2100 Gross per 24 hour  Intake 440 ml  Output 2100 ml  Net -1660 ml   Filed Weights   12/24/20 1120 12/24/20 1430 12/25/20 0559  Weight: 71.3 kg 69.6 kg 70.8 kg    Examination:  General exam: No distress.  Currently on room air.  Looking chronically.  Poor historian Respiratory system: Decreased breath sounds at bases bilaterally with scattered crackles cardiovascular system: Rate controlled, S1-S2 heard  gastrointestinal system: Abdomen is nondistended, soft and nontender.  Bowel sounds are heard  extremities: Trace lower extremity edema present; no clubbing  Data Reviewed: I have personally reviewed following labs and imaging studies  CBC: Recent Labs  Lab 12/19/20 0324 12/19/20 0955 12/20/20 0520 12/20/20 0954 12/21/20 0155 12/21/20 1359 12/22/20 0135 12/22/20 0944 12/22/20 1740 12/23/20 0203 12/24/20 0106  WBC 8.6  --  8.1  --  8.7  --   --   --   --  7.7 6.7  NEUTROABS 7.4  --  7.0  --  7.6  --   --   --   --  6.2 5.1  HGB 11.2*   < > 10.3*   < > 9.9*   < > 8.9* 8.8* 8.6* 8.3* 7.8*  HCT 35.4*   < > 32.7*   < > 29.6*   < > 27.9* 28.7* 28.2* 26.1* 25.8*  MCV 75.6*  --  75.2*  --  74.2*  --   --   --   --  77.0* 77.9*  PLT 252  --  225  --  221  --   --   --   --  162 155   < > = values in this interval not displayed.   Basic Metabolic Panel: Recent Labs  Lab  12/20/20 0520 12/21/20 1507 12/23/20 0203 12/24/20 0106 12/25/20 0147  NA 130* 135 134* 132* 134*  K 4.0 4.1 4.0 3.8 4.2  CL 91* 96* 97* 94* 95*  CO2 21* '22 25 25 25  '$ GLUCOSE 163* 166* 88 117* 111*  BUN 140* 145* 54* 73* 45*  CREATININE 6.58* 7.09* 4.31* 5.22* 4.32*  CALCIUM 8.5* 8.0* 8.1* 8.5* 8.4*  PHOS  --  10.2* 5.8* 6.6* 4.6   GFR: Estimated Creatinine Clearance: 17 mL/min (A) (by C-G formula based on SCr of 4.32 mg/dL (H)). Liver Function Tests: Recent Labs  Lab 12/21/20 1507 12/23/20 0203 12/24/20 0106 12/25/20 0147  ALBUMIN 2.8* 2.6* 2.5* 2.6*   No results for  input(s): LIPASE, AMYLASE in the last 168 hours. No results for input(s): AMMONIA in the last 168 hours. Coagulation Profile: No results for input(s): INR, PROTIME in the last 168 hours. Cardiac Enzymes: No results for input(s): CKTOTAL, CKMB, CKMBINDEX, TROPONINI in the last 168 hours. BNP (last 3 results) No results for input(s): PROBNP in the last 8760 hours. HbA1C: No results for input(s): HGBA1C in the last 72 hours. CBG: Recent Labs  Lab 12/23/20 1635 12/23/20 2102 12/24/20 0713 12/24/20 2121 12/25/20 0738  GLUCAP 147* 164* 118* 152* 157*   Lipid Profile: No results for input(s): CHOL, HDL, LDLCALC, TRIG, CHOLHDL, LDLDIRECT in the last 72 hours. Thyroid Function Tests: No results for input(s): TSH, T4TOTAL, FREET4, T3FREE, THYROIDAB in the last 72 hours. Anemia Panel: No results for input(s): VITAMINB12, FOLATE, FERRITIN, TIBC, IRON, RETICCTPCT in the last 72 hours. Sepsis Labs: No results for input(s): PROCALCITON, LATICACIDVEN in the last 168 hours.  Recent Results (from the past 240 hour(s))  Surgical PCR screen     Status: None   Collection Time: 12/18/20  6:34 AM   Specimen: Nasal Mucosa; Nasal Swab  Result Value Ref Range Status   MRSA, PCR NEGATIVE NEGATIVE Final   Staphylococcus aureus NEGATIVE NEGATIVE Final    Comment: (NOTE) The Xpert SA Assay (FDA approved for NASAL  specimens in patients 81 years of age and older), is one component of a comprehensive surveillance program. It is not intended to diagnose infection nor to guide or monitor treatment. Performed at [facility], [place] [address] [place], [place] [address]          Radiology Studies: No results found.      Scheduled Meds: . atorvastatin  40 mg Oral q1800  . Chlorhexidine Gluconate Cloth  6 each Topical Q0600  . Chlorhexidine Gluconate Cloth  6 each Topical Q0600  . Chlorhexidine Gluconate Cloth  6 each Topical Q0600  . citalopram  10 mg Oral Daily  . darbepoetin (ARANESP) injection - DIALYSIS  40 mcg Intravenous Q Tue-HD  . feeding supplement (NEPRO CARB STEADY)  237 mL Oral BID BM  . hydrALAZINE  100 mg Oral Q8H  . influenza vac split quadrivalent PF  0.5 mL Intramuscular Tomorrow-1000  . insulin aspart  0-9 Units Subcutaneous TID WC  . isosorbide mononitrate  60 mg Oral Daily  . labetalol  200 mg Oral BID  . melatonin  5 mg Oral QHS  . multivitamin  1 tablet Oral QHS  . pantoprazole  40 mg Oral BID  . pneumococcal 23 valent vaccine  0.5 mL Intramuscular Tomorrow-1000  . polyethylene glycol  17 g Oral Daily  . sevelamer carbonate  800 mg Oral TID WC  . sodium chloride flush  3 mL Intravenous Q12H   Continuous Infusions: . sodium chloride            Aline August, MD [facility] 12/25/2020, 7:53 AM

## 2020-12-25 NOTE — Progress Notes (Signed)
Renal Navigator requested an update from [facility] Admissions on AKI referral. Navigator will continue to follow. Navigator notes plan in the works to find SNF placement and will communicate with TOC CSW/N. Rayyan.   Alphonzo Cruise, [place] Renal Navigator [phone]
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
## 2020-12-25 NOTE — Progress Notes (Signed)
Nutrition Follow-up  DOCUMENTATION CODES:   Severe malnutrition in context of acute illness/injury  INTERVENTION:  Continue Nepro Shake po BID, each supplement provides 425 kcal and 19 grams protein.  Encourage adequate PO intake.   Renal diet handout given and discussed.  NUTRITION DIAGNOSIS:   Severe Malnutrition related to acute illness (SOB r/t CHF causing poor appetite) as evidenced by mild fat depletion,mild muscle depletion,moderate muscle depletion,moderate fat depletion; ongoing  GOAL:   Patient will meet greater than or equal to 90% of their needs; progressing  MONITOR:   PO intake,Supplement acceptance,Labs  REASON FOR ASSESSMENT:   Malnutrition Screening Tool    ASSESSMENT:   65 yo male admitted with increasing SOB, CHF decompensation r/t hypertensive urgency. PMH includes HTN, CKD-3b, DM-2, HLD.  2/21 tunneled HD catheter and LUE AV graft placed. Received first HD 2/21.  Meal completion has been 100%. Pt reports having a good appetite with no difficulties. Pt currently has Nepro shake ordered and has been consuming them. RD to continue with current orders to aid in caloric and protein needs. RD consulted for diet education regarding a renal diet. Handout "Eating Healthy with Kidney Disease" given and discussed with patient. Noted, pt fatigued during time of visit and had difficulties paying attention. List of food recommended and not recommended given. Discussed importance for pt to follow a renal diet. Pt expressed understanding.   Labs and medications reviewed.   Diet Order:   Diet Order            Diet renal/carb modified with fluid restriction Fluid restriction: 1200 mL Fluid; Room service appropriate? Yes; Fluid consistency: Thin  Diet effective now                 EDUCATION NEEDS:   Not appropriate for education at this time  Skin:  Skin Assessment: Reviewed RN Assessment  Last BM:  2/22 type 6  Height:   Ht Readings from Last 1  Encounters:  12/17/20 '5\' 9"'$  (1.753 m)    Weight:   Wt Readings from Last 1 Encounters:  12/25/20 70.8 kg    Ideal Body Weight:  72.7 kg  BMI:  Body mass index is 23.05 kg/m.  Estimated Nutritional Needs:   Kcal:  2200-2500  Protein:  110-130 gm  Fluid:  1 L + UOP  Corrin Parker, MS, RD, LDN RD pager number/after hours weekend pager number on Amion.

## 2020-12-25 NOTE — Progress Notes (Signed)
Renal Navigator received update from [facility] that patient has been accepted for AKI treatment-seat time pending.  Renal Navigator contacted [facility] to see if patient's address in [facility] is within their catchment area and it is. Navigator passed this information along to [facility] CSW/N. Rayyan in the event that it would be prudent for patient to apply now even though plan is for discharge to SNF.  Alphonzo Cruise, [place] Renal Navigator [phone]

## 2020-12-25 NOTE — Progress Notes (Signed)
[facility] ROUNDING NOTE   Subjective:   Feels ok today.  Hasn't heard about a spot for outpatient HD.  Had 0.6 kg UOP over 2/24.  Review of systems:   Denies shortness of breath or chest pain Denies n/v  ---------------- Brief history: This is a 65 year old gentleman with history of diabetes hypertension which has been malignant times, hyperlipidemia history of stroke, gout stage IV chronic kidney disease with a baseline serum creatinine by 2.05 August 2020.  He presented with dyspnea and lower extremity swelling.  He was also found to have a blood pressure was elevated to [id number] systolic.  His blood sugar was 320 and creatinine elevated at 5 mg/dL with chest x-ray which was consistent with pulmonary edema.  There is reported history of nonsteroidal anti-inflammatory drug use. Urinalysis showed no white blood cells no red blood cells.  There was greater than 300 mg of proteinuria noted on 08/25/2020 however only 100 mg/dL noted on 12/15/2020.  Renal ultrasound showed increased bilateral renal echogenicity no hydronephrosis.  Kidney size appears to be small bilaterally.     Objective:  Vital signs in last 24 hours:  Temp:  [98 F (36.7 C)-98.5 F (36.9 C)] 98.5 F (36.9 C) (02/24 2121) Pulse Rate:  [65-81] 81 (02/24 2116) Resp:  [19-22] 20 (02/24 1430) BP: (129-162)/(61-78) 137/73 (02/25 0557) SpO2:  [93 %-94 %] 94 % (02/24 2116) Weight:  [69.6 kg-71.3 kg] 70.8 kg (02/25 0559)  Weight change: -0.4 kg Filed Weights   12/24/20 1120 12/24/20 1430 12/25/20 0559  Weight: 71.3 kg 69.6 kg 70.8 kg    Intake/Output: I/O last 3 completed shifts: In: 800 [P.O.:800] Out: 2600 [Urine:1100; Other:1500]   Intake/Output this shift:  No intake/output data recorded.  GEN: adult male in bed in NAD ENT: no nasal discharge, mmm EYES: no scleral icterus, eomi CV: S1S2 no rub PULM: clear and unlabored.  ABD: soft/NT/ND SKIN: no rashes or jaundice  EXT: no edema, warm and well  perfused [facility] RIJ tunn catheter; lue AVF bruit and thrill   Basic Metabolic Panel: Recent Labs  Lab 12/20/20 0520 12/21/20 1507 12/23/20 0203 12/24/20 0106 12/25/20 0147  NA 130* 135 134* 132* 134*  K 4.0 4.1 4.0 3.8 4.2  CL 91* 96* 97* 94* 95*  CO2 21* '22 25 25 25  '$ GLUCOSE 163* 166* 88 117* 111*  BUN 140* 145* 54* 73* 45*  CREATININE 6.58* 7.09* 4.31* 5.22* 4.32*  CALCIUM 8.5* 8.0* 8.1* 8.5* 8.4*  PHOS  --  10.2* 5.8* 6.6* 4.6    Liver Function Tests: Recent Labs  Lab 12/21/20 1507 12/23/20 0203 12/24/20 0106 12/25/20 0147  ALBUMIN 2.8* 2.6* 2.5* 2.6*   CBC: Recent Labs  Lab 12/19/20 0324 12/19/20 0955 12/20/20 0520 12/20/20 0954 12/21/20 0155 12/21/20 1359 12/22/20 0135 12/22/20 0944 12/22/20 1740 12/23/20 0203 12/24/20 0106  WBC 8.6  --  8.1  --  8.7  --   --   --   --  7.7 6.7  NEUTROABS 7.4  --  7.0  --  7.6  --   --   --   --  6.2 5.1  HGB 11.2*   < > 10.3*   < > 9.9*   < > 8.9* 8.8* 8.6* 8.3* 7.8*  HCT 35.4*   < > 32.7*   < > 29.6*   < > 27.9* 28.7* 28.2* 26.1* 25.8*  MCV 75.6*  --  75.2*  --  74.2*  --   --   --   --  77.0* 77.9*  PLT 252  --  225  --  221  --   --   --   --  162 155   < > = values in this interval not displayed.    CBG: Recent Labs  Lab 12/23/20 1635 12/23/20 2102 12/24/20 0713 12/24/20 2121 12/25/20 0738  GLUCAP 147* 164* 118* 152* 157*    Microbiology: Results for orders placed or performed during the hospital encounter of 12/14/20  Resp Panel by RT-PCR (Flu A&B, Covid) Nasopharyngeal Swab     Status: None   Collection Time: 12/14/20 11:00 AM   Specimen: Nasopharyngeal Swab; Nasopharyngeal(NP) swabs in vial transport medium  Result Value Ref Range Status   SARS Coronavirus 2 by RT PCR NEGATIVE NEGATIVE Final    Comment: (NOTE) SARS-CoV-2 target nucleic acids are NOT DETECTED.  The SARS-CoV-2 RNA is generally detectable in upper respiratory specimens during the acute phase of infection. The lowest concentration  of SARS-CoV-2 viral copies this assay can detect is 138 copies/mL. A negative result does not preclude SARS-Cov-2 infection and should not be used as the sole basis for treatment or other patient management decisions. A negative result may occur with  improper specimen collection/handling, submission of specimen other than nasopharyngeal swab, presence of viral mutation(s) within the areas targeted by this assay, and inadequate number of viral copies(<138 copies/mL). A negative result must be combined with clinical observations, patient history, and epidemiological information. The expected result is Negative.  Fact Sheet for Patients:  EntrepreneurPulse.com.au  Fact Sheet for Healthcare Providers:  IncredibleEmployment.be  This test is no t yet approved or cleared by the [place] FDA and  has been authorized for detection and/or diagnosis of SARS-CoV-2 by FDA under an Emergency Use Authorization (EUA). This EUA will remain  in effect (meaning this test can be used) for the duration of the COVID-19 declaration under Section 564(b)(1) of the Act, 21 U.S.C.section 360bbb-3(b)(1), unless the authorization is terminated  or revoked sooner.       Influenza A by PCR NEGATIVE NEGATIVE Final   Influenza B by PCR NEGATIVE NEGATIVE Final    Comment: (NOTE) The Xpert Xpress SARS-CoV-2/FLU/RSV plus assay is intended as an aid in the diagnosis of influenza from Nasopharyngeal swab specimens and should not be used as a sole basis for treatment. Nasal washings and aspirates are unacceptable for Xpert Xpress SARS-CoV-2/FLU/RSV testing.  Fact Sheet for Patients: EntrepreneurPulse.com.au  Fact Sheet for Healthcare Providers: IncredibleEmployment.be  This test is not yet approved or cleared by the [place] FDA and has been authorized for detection and/or diagnosis of SARS-CoV-2 by FDA under an Emergency Use  Authorization (EUA). This EUA will remain in effect (meaning this test can be used) for the duration of the COVID-19 declaration under Section 564(b)(1) of the Act, 21 U.S.C. section 360bbb-3(b)(1), unless the authorization is terminated or revoked.  Performed at [facility], [place] [address] [place], [place] [address]   Surgical PCR screen     Status: None   Collection Time: 12/18/20  6:34 AM   Specimen: Nasal Mucosa; Nasal Swab  Result Value Ref Range Status   MRSA, PCR NEGATIVE NEGATIVE Final   Staphylococcus aureus NEGATIVE NEGATIVE Final    Comment: (NOTE) The Xpert SA Assay (FDA approved for NASAL specimens in patients 2 years of age and older), is one component of a comprehensive surveillance program. It is not intended to diagnose infection nor to guide or monitor treatment. Performed at [facility], [address] [place], [place] [address]      Imaging: No results found.   Medications:   . sodium chloride     . atorvastatin  40 mg Oral q1800  . Chlorhexidine Gluconate Cloth  6 each Topical Q0600  . Chlorhexidine Gluconate Cloth  6 each Topical Q0600  . Chlorhexidine Gluconate Cloth  6 each Topical Q0600  . citalopram  10 mg Oral Daily  . darbepoetin (ARANESP) injection - DIALYSIS  40 mcg Intravenous Q Tue-HD  . feeding supplement (NEPRO CARB STEADY)  237 mL Oral BID BM  . hydrALAZINE  100 mg Oral Q8H  . influenza vac split quadrivalent PF  0.5 mL Intramuscular Tomorrow-1000  . insulin aspart  0-9 Units Subcutaneous TID WC  . isosorbide mononitrate  60 mg Oral Daily  . labetalol  200 mg Oral BID  . melatonin  5 mg Oral QHS  . multivitamin  1 tablet Oral QHS  . pantoprazole  40 mg Oral BID  . pneumococcal 23 valent vaccine  0.5 mL Intramuscular Tomorrow-1000  . polyethylene glycol  17 g Oral Daily  . sevelamer carbonate  800 mg Oral TID WC  . sodium chloride flush  3 mL Intravenous Q12H   sodium chloride, acetaminophen, benzonatate,  chlorpheniramine-HYDROcodone, guaiFENesin-dextromethorphan, heparin sodium (porcine), levalbuterol, LORazepam, ondansetron (ZOFRAN) IV, oxyCODONE-acetaminophen, sodium chloride flush  Assessment/ Plan:  1.AKI on CKD: Creatinine 2.6 in October 2021.  Rapidly progressing kidney loss.  Ultrasound with small kidneys.  Possible renal artery stenosis based on kidney size.  RIJ tunn catheter and LUE avf on 2/21 with vascular.  First HD on 2/21 - Next HD on 2/26 - has been a TTS schedule  - Monitor for signs of recovery - seems guarded - Have started CLIP process as AKI    2. Hypertension/volume  -malignant hypertension on arrival with flash pulmonary edema intermittently.  Blood pressure much improved.  Discontinued renal artery duplex as still not done and HTN controlled.  Stopped minoxidil.    3. Anemia CKD - s/p feraheme; aranesp 40 mcg once on 2/22. CBC in AM  4.  Hypokalemia- resolved with supplementation  5.  A. Fib- could be causing some variable renal perfusion-cardiology has seen   6. Hyponatremia: mild Likely related to free water retention in setting of kidney disease.   7. Metabolic bone disease - intact PTH 169.  on sevelamer. On renal diet. Improved phos.   Claudia Desanctis, MD 12/25/2020 10:39 AM

## 2020-12-26 DIAGNOSIS — N186 End stage renal disease: Secondary | ICD-10-CM | POA: Diagnosis not present

## 2020-12-26 DIAGNOSIS — I5033 Acute on chronic diastolic (congestive) heart failure: Secondary | ICD-10-CM | POA: Diagnosis not present

## 2020-12-26 DIAGNOSIS — Z992 Dependence on renal dialysis: Secondary | ICD-10-CM | POA: Diagnosis not present

## 2020-12-26 DIAGNOSIS — N179 Acute kidney failure, unspecified: Secondary | ICD-10-CM | POA: Diagnosis not present

## 2020-12-26 LAB — CBC
HCT: 24.9 % — ABNORMAL LOW (ref 39.0–52.0)
Hemoglobin: 7.9 g/dL — ABNORMAL LOW (ref 13.0–17.0)
MCH: 24.2 pg — ABNORMAL LOW (ref 26.0–34.0)
MCHC: 31.7 g/dL (ref 30.0–36.0)
MCV: 76.4 fL — ABNORMAL LOW (ref 80.0–100.0)
Platelets: 183 10*3/uL (ref 150–400)
RBC: 3.26 MIL/uL — ABNORMAL LOW (ref 4.22–5.81)
RDW: 15.4 % (ref 11.5–15.5)
WBC: 7.5 10*3/uL (ref 4.0–10.5)
nRBC: 0 % (ref 0.0–0.2)

## 2020-12-26 LAB — RENAL FUNCTION PANEL
Albumin: 2.6 g/dL — ABNORMAL LOW (ref 3.5–5.0)
Anion gap: 13 (ref 5–15)
BUN: 70 mg/dL — ABNORMAL HIGH (ref 8–23)
CO2: 23 mmol/L (ref 22–32)
Calcium: 8.4 mg/dL — ABNORMAL LOW (ref 8.9–10.3)
Chloride: 95 mmol/L — ABNORMAL LOW (ref 98–111)
Creatinine, Ser: 5.16 mg/dL — ABNORMAL HIGH (ref 0.61–1.24)
GFR, Estimated: 12 mL/min — ABNORMAL LOW (ref >60)
Glucose, Bld: 128 mg/dL — ABNORMAL HIGH (ref 70–99)
Phosphorus: 4.7 mg/dL — ABNORMAL HIGH (ref 2.5–4.6)
Potassium: 4.1 mmol/L (ref 3.5–5.1)
Sodium: 131 mmol/L — ABNORMAL LOW (ref 135–145)

## 2020-12-26 LAB — GLUCOSE, CAPILLARY
Glucose-Capillary: 133 mg/dL — ABNORMAL HIGH (ref 70–99)
Glucose-Capillary: 135 mg/dL — ABNORMAL HIGH (ref 70–99)
Glucose-Capillary: 190 mg/dL — ABNORMAL HIGH (ref 70–99)

## 2020-12-26 MED ORDER — HEPARIN SODIUM (PORCINE) 1000 UNIT/ML DIALYSIS: 1000.0000 [IU] | INTRAMUSCULAR | Status: DC | PRN

## 2020-12-26 MED ORDER — PENTAFLUOROPROP-TETRAFLUOROETH EX AERO: 1.0000 "application " | INHALATION_SPRAY | CUTANEOUS | Status: DC | PRN

## 2020-12-26 MED ORDER — HEPARIN SODIUM (PORCINE) 1000 UNIT/ML IJ SOLN
INTRAMUSCULAR | Status: AC
  Administered 2020-12-26: 1000 [IU] via INTRAVENOUS_CENTRAL
  Filled 2020-12-26: qty 4

## 2020-12-26 MED ORDER — SODIUM CHLORIDE 0.9 % IV SOLN: 100.0000 mL | INTRAVENOUS | Status: DC | PRN

## 2020-12-26 MED ORDER — LIDOCAINE HCL (PF) 1 % IJ SOLN
5.0000 mL | INTRAMUSCULAR | Status: DC | PRN
  Filled 2020-12-26: qty 5

## 2020-12-26 MED ORDER — ALTEPLASE 2 MG IJ SOLR: 2.0000 mg | Freq: Once | INTRAMUSCULAR | Status: DC | PRN

## 2020-12-26 MED ORDER — LIDOCAINE-PRILOCAINE 2.5-2.5 % EX CREA: 1.0000 "application " | TOPICAL_CREAM | CUTANEOUS | Status: DC | PRN

## 2020-12-26 NOTE — Progress Notes (Signed)
[facility] ROUNDING NOTE   Subjective:   Feels ok today.  Hasn't heard about a spot for outpatient HD.  Had 0.7 kg UOP over 2/25.  Spoke with his daughter at bedside.  They're planning on SNF as very weak -hasn't walked.  Last HD on 2/24 with 1.5 kg UF - hasn't gone today yet.   Review of systems:   Denies shortness of breath or chest pain Denies n/v  ---------------- Brief history: This is a 65 year old gentleman with history of diabetes hypertension which has been malignant times, hyperlipidemia history of stroke, gout stage IV chronic kidney disease with a baseline serum creatinine by 2.05 August 2020.  He presented with dyspnea and lower extremity swelling.  He was also found to have a blood pressure was elevated to [id number] systolic.  His blood sugar was 320 and creatinine elevated at 5 mg/dL with chest x-ray which was consistent with pulmonary edema.  There is reported history of nonsteroidal anti-inflammatory drug use. Urinalysis showed no white blood cells no red blood cells.  There was greater than 300 mg of proteinuria noted on 08/25/2020 however only 100 mg/dL noted on 12/15/2020.  Renal ultrasound showed increased bilateral renal echogenicity no hydronephrosis.  Kidney size appears to be small bilaterally.     Objective:  Vital signs in last 24 hours:  Temp:  [98.5 F (36.9 C)-98.8 F (37.1 C)] 98.6 F (37 C) (02/26 0517) Pulse Rate:  [73-84] 77 (02/26 0517) Resp:  [14-20] 16 (02/26 0517) BP: (129-151)/(63-68) 133/63 (02/26 0517) SpO2:  [94 %-96 %] 94 % (02/26 0517) Weight:  [72 kg] 72 kg (02/26 0500)  Weight change: 0.7 kg Filed Weights   12/24/20 1430 12/25/20 0559 12/26/20 0500  Weight: 69.6 kg 70.8 kg 72 kg    Intake/Output: I/O last 3 completed shifts: In: 440 [P.O.:440] Out: 700 [Urine:700]   Intake/Output this shift:  No intake/output data recorded.  GEN: adult male in bed in NAD ENT: no nasal discharge, mmm EYES: no scleral icterus, eomi CV:  S1S2 no rub PULM: clear and unlabored.  ABD: soft/NT/ND SKIN: no rashes or jaundice  EXT: no edema, warm and well perfused Access RIJ tunn catheter; lue AVF bruit and thrill   Basic Metabolic Panel: Recent Labs  Lab 12/21/20 1507 12/23/20 0203 12/24/20 0106 12/25/20 0147 12/26/20 0126  NA 135 134* 132* 134* 131*  K 4.1 4.0 3.8 4.2 4.1  CL 96* 97* 94* 95* 95*  CO2 '22 25 25 25 23  '$ GLUCOSE 166* 88 117* 111* 128*  BUN 145* 54* 73* 45* 70*  CREATININE 7.09* 4.31* 5.22* 4.32* 5.16*  CALCIUM 8.0* 8.1* 8.5* 8.4* 8.4*  PHOS 10.2* 5.8* 6.6* 4.6 4.7*    Liver Function Tests: Recent Labs  Lab 12/21/20 1507 12/23/20 0203 12/24/20 0106 12/25/20 0147 12/26/20 0126  ALBUMIN 2.8* 2.6* 2.5* 2.6* 2.6*   CBC: Recent Labs  Lab 12/20/20 0520 12/20/20 0954 12/21/20 0155 12/21/20 1359 12/22/20 0944 12/22/20 1740 12/23/20 0203 12/24/20 0106 12/26/20 0126  WBC 8.1  --  8.7  --   --   --  7.7 6.7 7.5  NEUTROABS 7.0  --  7.6  --   --   --  6.2 5.1  --   HGB 10.3*   < > 9.9*   < > 8.8* 8.6* 8.3* 7.8* 7.9*  HCT 32.7*   < > 29.6*   < > 28.7* 28.2* 26.1* 25.8* 24.9*  MCV 75.2*  --  74.2*  --   --   --  77.0* 77.9* 76.4*  PLT 225  --  221  --   --   --  162 155 183   < > = values in this interval not displayed.    CBG: Recent Labs  Lab 12/25/20 1153 12/25/20 1706 12/25/20 2156 12/26/20 0800 12/26/20 1209  GLUCAP 144* 128* 198* 133* 135*    Microbiology: Results for orders placed or performed during the hospital encounter of 12/14/20  Resp Panel by RT-PCR (Flu A&B, Covid) Nasopharyngeal Swab     Status: None   Collection Time: 12/14/20 11:00 AM   Specimen: Nasopharyngeal Swab; Nasopharyngeal(NP) swabs in vial transport medium  Result Value Ref Range Status   SARS Coronavirus 2 by RT PCR NEGATIVE NEGATIVE Final    Comment: (NOTE) SARS-CoV-2 target nucleic acids are NOT DETECTED.  The SARS-CoV-2 RNA is generally detectable in upper respiratory specimens during the acute  phase of infection. The lowest concentration of SARS-CoV-2 viral copies this assay can detect is 138 copies/mL. A negative result does not preclude SARS-Cov-2 infection and should not be used as the sole basis for treatment or other patient management decisions. A negative result may occur with  improper specimen collection/handling, submission of specimen other than nasopharyngeal swab, presence of viral mutation(s) within the areas targeted by this assay, and inadequate number of viral copies(<138 copies/mL). A negative result must be combined with clinical observations, patient history, and epidemiological information. The expected result is Negative.  Fact Sheet for Patients:  EntrepreneurPulse.com.au  Fact Sheet for Healthcare Providers:  IncredibleEmployment.be  This test is no t yet approved or cleared by the [place] FDA and  has been authorized for detection and/or diagnosis of SARS-CoV-2 by FDA under an Emergency Use Authorization (EUA). This EUA will remain  in effect (meaning this test can be used) for the duration of the COVID-19 declaration under Section 564(b)(1) of the Act, 21 U.S.C.section 360bbb-3(b)(1), unless the authorization is terminated  or revoked sooner.       Influenza A by PCR NEGATIVE NEGATIVE Final   Influenza B by PCR NEGATIVE NEGATIVE Final    Comment: (NOTE) The Xpert Xpress SARS-CoV-2/FLU/RSV plus assay is intended as an aid in the diagnosis of influenza from Nasopharyngeal swab specimens and should not be used as a sole basis for treatment. Nasal washings and aspirates are unacceptable for Xpert Xpress SARS-CoV-2/FLU/RSV testing.  Fact Sheet for Patients: EntrepreneurPulse.com.au  Fact Sheet for Healthcare Providers: IncredibleEmployment.be  This test is not yet approved or cleared by the [place] FDA and has been authorized for detection and/or diagnosis of  SARS-CoV-2 by FDA under an Emergency Use Authorization (EUA). This EUA will remain in effect (meaning this test can be used) for the duration of the COVID-19 declaration under Section 564(b)(1) of the Act, 21 U.S.C. section 360bbb-3(b)(1), unless the authorization is terminated or revoked.  Performed at [facility], [place] [address] [place], [place] [address]   Surgical PCR screen     Status: None   Collection Time: 12/18/20  6:34 AM   Specimen: Nasal Mucosa; Nasal Swab  Result Value Ref Range Status   MRSA, PCR NEGATIVE NEGATIVE Final   Staphylococcus aureus NEGATIVE NEGATIVE Final    Comment: (NOTE) The Xpert SA Assay (FDA approved for NASAL specimens in patients 46 years of age and older), is one component of a comprehensive surveillance program. It is not intended to diagnose infection nor to guide or monitor treatment. Performed at [facility], [place] [address] [place], [place]  [address]      Imaging: No results found.   Medications:   . sodium chloride    . sodium chloride    . sodium chloride     . atorvastatin  40 mg Oral q1800  . Chlorhexidine Gluconate Cloth  6 each Topical Q0600  . Chlorhexidine Gluconate Cloth  6 each Topical Q0600  . Chlorhexidine Gluconate Cloth  6 each Topical Q0600  . Chlorhexidine Gluconate Cloth  6 each Topical Q0600  . citalopram  10 mg Oral Daily  . darbepoetin (ARANESP) injection - DIALYSIS  40 mcg Intravenous Q Tue-HD  . feeding supplement (NEPRO CARB STEADY)  237 mL Oral BID BM  . hydrALAZINE  100 mg Oral Q8H  . influenza vac split quadrivalent PF  0.5 mL Intramuscular Tomorrow-1000  . insulin aspart  0-9 Units Subcutaneous TID WC  . isosorbide mononitrate  60 mg Oral Daily  . labetalol  200 mg Oral BID  . melatonin  5 mg Oral QHS  . multivitamin  1 tablet Oral QHS  . pantoprazole  40 mg Oral BID  . pneumococcal 23 valent vaccine  0.5 mL Intramuscular Tomorrow-1000  . polyethylene glycol  17 g Oral Daily  .  sevelamer carbonate  800 mg Oral TID WC  . sodium chloride flush  3 mL Intravenous Q12H   sodium chloride, sodium chloride, sodium chloride, acetaminophen, alteplase, benzonatate, chlorpheniramine-HYDROcodone, guaiFENesin-dextromethorphan, heparin, heparin sodium (porcine), levalbuterol, lidocaine (PF), lidocaine-prilocaine, LORazepam, ondansetron (ZOFRAN) IV, oxyCODONE-acetaminophen, pentafluoroprop-tetrafluoroeth, sodium chloride flush  Assessment/ Plan:  1.AKI on CKD: Creatinine 2.6 in October 2021.  Rapidly progressing kidney loss.  Ultrasound with small kidneys.  Possible renal artery stenosis based on kidney size.  RIJ tunn catheter and LUE avf on 2/21 with vascular.  First HD on 2/21 - Next HD today (2/26) - has been a TTS schedule  - Monitor for signs of recovery - seems guarded - Have started CLIP process as AKI  - no spot yet  2. Hypertension/volume  -malignant hypertension on arrival with flash pulmonary edema intermittently.  Blood pressure much improved.  Discontinued renal artery duplex as still not done and HTN controlled.  Stopped minoxidil.    3. Anemia CKD - s/p feraheme; aranesp 40 mcg once on 2/22. May need PRBC's soon.  4.  Hypokalemia- resolved with supplementation  5.  A. Fib- could be causing some variable renal perfusion-cardiology has seen   6. Hyponatremia: mild Likely related to free water retention in setting of kidney disease.   7. Metabolic bone disease - intact PTH 169.  on sevelamer. On renal diet. Improved phos.   dispo - awaiting outpatient HD unit and a SNF  Claudia Desanctis, MD 12/26/2020 1:28 PM

## 2020-12-26 NOTE — Progress Notes (Signed)
Patient ID: Ruben Samek., male   DOB: [birth year], 65 y.o.   MRN: [id number]  PROGRESS NOTE    Ruben Camacho.  TO:[id number] DOB: 14-Nov-1955 DOA: 12/14/2020 PCP: Horald Pollen, MD   Brief Narrative:  65 year old male with history of hypertension, chronic kidney disease stage IV, diabetes mellitus type 2, hyperlipidemia presented with worsening shortness of breath.  On presentation, patient was found to have pulmonary edema along with acute kidney injury on chronic kidney disease with creatinine of five compared to baseline of 2.6 along with elevated troponin and chest x-ray showing pulmonary edema.  He was started on intravenous Lasix.  Nephrology was also consulted.  Subsequently, he had coffee-ground emesis for which heparin and aspirin were stopped and he was started on Protonix.  GI recommended EGD but patient refused.  During the hospitalization, renal function did not improve and patient was started on hemodialysis on 12/21/2020.  PT has recommended SNF placement.  Assessment & Plan:   Acute kidney injury on chronic renal disease stage IV -Baseline creatinine 2.6.  Presented with creatinine of 5.  Treated with intravenous diuretics but kidney function did not improve.  Renal ultrasound showed small kidneys -Nephrology following.  Patient was started on hemodialysis on 12/21/2020 -He underwent tunneled dialysis catheter placement and new left upper arm AV graft on 12/21/2020 by vascular surgery.  Outpatient follow-up with vascular surgery  Acute on chronic diastolic CHF Hypertensive emergency Elevated troponins: Most likely from demand ischemia -Patient initially required Cardene drip which was subsequently discontinued. -Blood pressure has improved and intermittently still on the higher side.  Continue isosorbide mononitrate, labetalol, minoxidil -Volume currently being managed by dialysis by nephrology.  Cardiology has signed off. -Strict input and output.  Daily weights.   Fluid restriction. -Echo showed EF of 65 to 70%.  Continue statins  Acute hypoxic respiratory failure -Resolved.  Currently on room air.  Off steroids.  Continue incentive spirometry and as needed nebs  Possible upper GI bleeding -Patient had coffee-ground emesis during this hospitalization. -heparin and aspirin were stopped and he was started on Protonix.  GI recommended EGD but patient refused.   -Subsequently, hemoglobin has stabilized.  Continue twice a day oral Protonix.  Transfuse if hemoglobin is less than 7  Anxiety/depression -Continue Celexa  Hyponatremia -Now being managed by dialysis by nephrology.    Chronic microcytic anemia -Hemoglobin stable.  Monitor intermittently  Diabetes mellitus type 2 with hyperglycemia -Oral meds on hold.  Continue CBGs with SSI  Severe malnutrition -Follow nutrition recommendation  Generalized conditioning -PT recommends SNF placement.  Social worker following.  DVT prophylaxis: SCDs Code Status: Full Family Communication: None Disposition Plan: Status is: Inpatient  Remains inpatient appropriate because:Hemodynamically unstable and Inpatient level of care appropriate due to severity of illness.     Dispo: The patient is from: Home              Anticipated d/c is to: SNF              Anticipated d/c date is: 1 day              Patient currently is medically stable to d/c.   Difficult to place patient No   Consultants: Nephrology/vascular surgery/cardiology/GI  Procedures:  tunneled dialysis catheter placement and new left upper arm AV graft on 12/21/2020 by vascular surgery Antimicrobials:  Anti-infectives (From admission, onward)   Start     Dose/Rate Route Frequency Ordered Stop   12/18/20 0000  ceFAZolin (ANCEF) IVPB 1 g/50  mL premix  Status:  Discontinued       Note to Pharmacy: Send with pt to OR   1 g 100 mL/hr over 30 Minutes Intravenous On call 12/17/20 1511 12/17/20 1513   12/18/20 0000  ceFAZolin (ANCEF) IVPB  2g/100 mL premix       Note to Pharmacy: Send with pt to OR   2 g 200 mL/hr over 30 Minutes Intravenous On call 12/17/20 1513 12/19/20 0000       Subjective: Patient seen and examined at bedside.  Poor historian.  No worsening shortness breath, fever or vomiting reported  objective: Vitals:   12/25/20 1513 12/25/20 2153 12/26/20 0500 12/26/20 0517  BP: 129/67 (!) 151/68  133/63  Pulse: 73 84  77  Resp: '20 14  16  '$ Temp: 98.5 F (36.9 C) 98.8 F (37.1 C)  98.6 F (37 C)  TempSrc: Oral Oral  Oral  SpO2: 95% 96%  94%  Weight:   72 kg   Height:        Intake/Output Summary (Last 24 hours) at 12/26/2020 0803 Last data filed at 12/26/2020 0500 Gross per 24 hour  Intake 240 ml  Output 700 ml  Net -460 ml   Filed Weights   12/24/20 1430 12/25/20 0559 12/26/20 0500  Weight: 69.6 kg 70.8 kg 72 kg    Examination:  General exam: On room air currently.  No acute distress.  Looking chronically.  Poor historian Respiratory system: Bilateral decreased breath sounds at bases with basilar crackles cardiovascular system: S1-S2 heard, rate controlled gastrointestinal system: Abdomen is nondistended, soft and nontender.  Normal bowel sounds heard  extremities: No cyanosis; mild lower extremity edema present  Data Reviewed: I have personally reviewed following labs and imaging studies  CBC: Recent Labs  Lab 12/20/20 0520 12/20/20 0954 12/21/20 0155 12/21/20 1359 12/22/20 0944 12/22/20 1740 12/23/20 0203 12/24/20 0106 12/26/20 0126  WBC 8.1  --  8.7  --   --   --  7.7 6.7 7.5  NEUTROABS 7.0  --  7.6  --   --   --  6.2 5.1  --   HGB 10.3*   < > 9.9*   < > 8.8* 8.6* 8.3* 7.8* 7.9*  HCT 32.7*   < > 29.6*   < > 28.7* 28.2* 26.1* 25.8* 24.9*  MCV 75.2*  --  74.2*  --   --   --  77.0* 77.9* 76.4*  PLT 225  --  221  --   --   --  162 155 183   < > = values in this interval not displayed.   Basic Metabolic Panel: Recent Labs  Lab 12/21/20 1507 12/23/20 0203 12/24/20 0106  12/25/20 0147 12/26/20 0126  NA 135 134* 132* 134* 131*  K 4.1 4.0 3.8 4.2 4.1  CL 96* 97* 94* 95* 95*  CO2 '22 25 25 25 23  '$ GLUCOSE 166* 88 117* 111* 128*  BUN 145* 54* 73* 45* 70*  CREATININE 7.09* 4.31* 5.22* 4.32* 5.16*  CALCIUM 8.0* 8.1* 8.5* 8.4* 8.4*  PHOS 10.2* 5.8* 6.6* 4.6 4.7*   GFR: Estimated Creatinine Clearance: 14.3 mL/min (A) (by C-G formula based on SCr of 5.16 mg/dL (H)). Liver Function Tests: Recent Labs  Lab 12/21/20 1507 12/23/20 0203 12/24/20 0106 12/25/20 0147 12/26/20 0126  ALBUMIN 2.8* 2.6* 2.5* 2.6* 2.6*   No results for input(s): LIPASE, AMYLASE in the last 168 hours. No results for input(s): AMMONIA in the last 168 hours. Coagulation Profile: No results  for input(s): INR, PROTIME in the last 168 hours. Cardiac Enzymes: No results for input(s): CKTOTAL, CKMB, CKMBINDEX, TROPONINI in the last 168 hours. BNP (last 3 results) No results for input(s): PROBNP in the last 8760 hours. HbA1C: No results for input(s): HGBA1C in the last 72 hours. CBG: Recent Labs  Lab 12/25/20 0738 12/25/20 1153 12/25/20 1706 12/25/20 2156 12/26/20 0800  GLUCAP 157* 144* 128* 198* 133*   Lipid Profile: No results for input(s): CHOL, HDL, LDLCALC, TRIG, CHOLHDL, LDLDIRECT in the last 72 hours. Thyroid Function Tests: No results for input(s): TSH, T4TOTAL, FREET4, T3FREE, THYROIDAB in the last 72 hours. Anemia Panel: No results for input(s): VITAMINB12, FOLATE, FERRITIN, TIBC, IRON, RETICCTPCT in the last 72 hours. Sepsis Labs: No results for input(s): PROCALCITON, LATICACIDVEN in the last 168 hours.  Recent Results (from the past 240 hour(s))  Surgical PCR screen     Status: None   Collection Time: 12/18/20  6:34 AM   Specimen: Nasal Mucosa; Nasal Swab  Result Value Ref Range Status   MRSA, PCR NEGATIVE NEGATIVE Final   Staphylococcus aureus NEGATIVE NEGATIVE Final    Comment: (NOTE) The Xpert SA Assay (FDA approved for NASAL specimens in patients  50 years of age and older), is one component of a comprehensive surveillance program. It is not intended to diagnose infection nor to guide or monitor treatment. Performed at [facility], [place] [address], [place],  [address]          Radiology Studies: No results found.      Scheduled Meds: . atorvastatin  40 mg Oral q1800  . Chlorhexidine Gluconate Cloth  6 each Topical Q0600  . Chlorhexidine Gluconate Cloth  6 each Topical Q0600  . Chlorhexidine Gluconate Cloth  6 each Topical Q0600  . Chlorhexidine Gluconate Cloth  6 each Topical Q0600  . citalopram  10 mg Oral Daily  . darbepoetin (ARANESP) injection - DIALYSIS  40 mcg Intravenous Q Tue-HD  . feeding supplement (NEPRO CARB STEADY)  237 mL Oral BID BM  . hydrALAZINE  100 mg Oral Q8H  . influenza vac split quadrivalent PF  0.5 mL Intramuscular Tomorrow-1000  . insulin aspart  0-9 Units Subcutaneous TID WC  . isosorbide mononitrate  60 mg Oral Daily  . labetalol  200 mg Oral BID  . melatonin  5 mg Oral QHS  . multivitamin  1 tablet Oral QHS  . pantoprazole  40 mg Oral BID  . pneumococcal 23 valent vaccine  0.5 mL Intramuscular Tomorrow-1000  . polyethylene glycol  17 g Oral Daily  . sevelamer carbonate  800 mg Oral TID WC  . sodium chloride flush  3 mL Intravenous Q12H   Continuous Infusions: . sodium chloride            Aline August, MD [facility] 12/26/2020, 8:03 AM

## 2020-12-26 NOTE — Plan of Care (Signed)
  Problem: Clinical Measurements: Goal: Will remain free from infection Outcome: Progressing Goal: Diagnostic test results will improve Outcome: Progressing Goal: Respiratory complications will improve Outcome: Progressing Goal: Cardiovascular complication will be avoided Outcome: Progressing   Problem: Safety: Goal: Ability to remain free from injury will improve Outcome: Progressing   Problem: Self-Concept: Goal: Body image disturbance will be avoided or minimized Outcome: Progressing

## 2020-12-27 DIAGNOSIS — N186 End stage renal disease: Secondary | ICD-10-CM | POA: Diagnosis not present

## 2020-12-27 DIAGNOSIS — N185 Chronic kidney disease, stage 5: Secondary | ICD-10-CM

## 2020-12-27 DIAGNOSIS — N179 Acute kidney failure, unspecified: Secondary | ICD-10-CM | POA: Diagnosis not present

## 2020-12-27 DIAGNOSIS — I5033 Acute on chronic diastolic (congestive) heart failure: Secondary | ICD-10-CM | POA: Diagnosis not present

## 2020-12-27 LAB — RENAL FUNCTION PANEL
Albumin: 2.7 g/dL — ABNORMAL LOW (ref 3.5–5.0)
Anion gap: 11 (ref 5–15)
BUN: 37 mg/dL — ABNORMAL HIGH (ref 8–23)
CO2: 25 mmol/L (ref 22–32)
Calcium: 8.6 mg/dL — ABNORMAL LOW (ref 8.9–10.3)
Chloride: 99 mmol/L (ref 98–111)
Creatinine, Ser: 3.3 mg/dL — ABNORMAL HIGH (ref 0.61–1.24)
GFR, Estimated: 20 mL/min — ABNORMAL LOW (ref >60)
Glucose, Bld: 112 mg/dL — ABNORMAL HIGH (ref 70–99)
Phosphorus: 3.2 mg/dL (ref 2.5–4.6)
Potassium: 4 mmol/L (ref 3.5–5.1)
Sodium: 135 mmol/L (ref 135–145)

## 2020-12-27 LAB — GLUCOSE, CAPILLARY
Glucose-Capillary: 120 mg/dL — ABNORMAL HIGH (ref 70–99)
Glucose-Capillary: 122 mg/dL — ABNORMAL HIGH (ref 70–99)
Glucose-Capillary: 91 mg/dL (ref 70–99)
Glucose-Capillary: 159 mg/dL — ABNORMAL HIGH (ref 70–99)

## 2020-12-27 MED ORDER — DARBEPOETIN ALFA 100 MCG/0.5ML IJ SOSY
100.0000 ug | PREFILLED_SYRINGE | INTRAMUSCULAR | Status: DC
  Administered 2020-12-29: 100 ug via INTRAVENOUS
  Filled 2020-12-27 (×2): qty 0.5

## 2020-12-27 NOTE — Progress Notes (Signed)
Patient ID: Ruben Mata., male   DOB: [birth year], 65 y.o.   MRN: [id number]  PROGRESS NOTE    Ruben Masse.  [id number] DOB: 06/26/1956 DOA: 12/14/2020 PCP: Horald Pollen, MD   Brief Narrative:  65 year old male with history of hypertension, chronic kidney disease stage IV, diabetes mellitus type 2, hyperlipidemia presented with worsening shortness of breath.  On presentation, patient was found to have pulmonary edema along with acute kidney injury on chronic kidney disease with creatinine of five compared to baseline of 2.6 along with elevated troponin and chest x-ray showing pulmonary edema.  He was started on intravenous Lasix.  Nephrology was also consulted.  Subsequently, he had coffee-ground emesis for which heparin and aspirin were stopped and he was started on Protonix.  GI recommended EGD but patient refused.  During the hospitalization, renal function did not improve and patient was started on hemodialysis on 12/21/2020.  PT has recommended SNF placement.  Assessment & Plan:   Acute kidney injury on chronic renal disease stage IV -Baseline creatinine 2.6.  Presented with creatinine of 5.  Treated with intravenous diuretics but kidney function did not improve.  Renal ultrasound showed small kidneys -Nephrology following.  Patient was started on hemodialysis on 12/21/2020 -He underwent tunneled dialysis catheter placement and new left upper arm AV graft on 12/21/2020 by vascular surgery.  Outpatient follow-up with vascular surgery -Dialysis as per nephrology schedule  Acute on chronic diastolic CHF Hypertensive emergency Elevated troponins: Most likely from demand ischemia -Patient initially required Cardene drip which was subsequently discontinued. -Blood pressure has improved and intermittently still on the higher side.  Continue isosorbide mononitrate, labetalol, minoxidil -Volume currently being managed by dialysis by nephrology.  Cardiology has signed  off. -Strict input and output.  Daily weights.  Fluid restriction. -Echo showed EF of 65 to 70%.  Continue statins  Acute hypoxic respiratory failure -Resolved.  Currently on room air.  Off steroids.  Continue incentive spirometry and as needed nebs  Possible upper GI bleeding -Patient had coffee-ground emesis during this hospitalization. -heparin and aspirin were stopped and he was started on Protonix.  GI recommended EGD but patient refused.   -Subsequently, hemoglobin has stabilized.  Continue twice a day oral Protonix.  Transfuse if hemoglobin is less than 7  Anxiety/depression -Continue Celexa  Hyponatremia -Now being managed by dialysis by nephrology. Improving.  Chronic microcytic anemia -Hemoglobin stable.  Monitor intermittently  Diabetes mellitus type 2 with hyperglycemia -Oral meds on hold.  Continue CBGs with SSI  Severe malnutrition -Follow nutrition recommendation  Generalized conditioning -PT recommends SNF placement.  Social worker following.  DVT prophylaxis: SCDs Code Status: Full Family Communication: None Disposition Plan: Status is: Inpatient  Remains inpatient appropriate because:Hemodynamically unstable and Inpatient level of care appropriate due to severity of illness.     Dispo: The patient is from: Home              Anticipated d/c is to: SNF              Anticipated d/c date is: 1 day              Patient currently is medically stable to d/c.   Difficult to place patient No   Consultants: Nephrology/vascular surgery/cardiology/GI  Procedures:  tunneled dialysis catheter placement and new left upper arm AV graft on 12/21/2020 by vascular surgery Antimicrobials:  Anti-infectives (From admission, onward)   Start     Dose/Rate Route Frequency Ordered Stop   12/18/20 0000  ceFAZolin (  ANCEF) IVPB 1 g/50 mL premix  Status:  Discontinued       Note to Pharmacy: Send with pt to OR   1 g 100 mL/hr over 30 Minutes Intravenous On call 12/17/20  1511 12/17/20 1513   12/18/20 0000  ceFAZolin (ANCEF) IVPB 2g/100 mL premix       Note to Pharmacy: Send with pt to OR   2 g 200 mL/hr over 30 Minutes Intravenous On call 12/17/20 1513 12/19/20 0000       Subjective: Patient seen and examined at bedside. No overnight fever, vomiting, worsening shortness of breath or chest pain reported.  Objective: Vitals:   12/26/20 2053 12/26/20 2344 12/27/20 0610 12/27/20 0748  BP: (!) 129/55  (!) 152/58 (!) 148/67  Pulse: 82   77  Resp: 14   16  Temp: 98.1 F (36.7 C)   98.2 F (36.8 C)  TempSrc: Axillary   Oral  SpO2: 91% 95%  94%  Weight:      Height:        Intake/Output Summary (Last 24 hours) at 12/27/2020 0805 Last data filed at 12/27/2020 0600 Gross per 24 hour  Intake 120 ml  Output 1325 ml  Net -1205 ml   Filed Weights   12/24/20 1430 12/25/20 0559 12/26/20 0500  Weight: 69.6 kg 70.8 kg 72 kg    Examination:  General exam: No acute distress. Currently on room air. Looking chronically.  Poor historian Respiratory system: Decreased breath sounds at bases bilaterally with scattered crackles cardiovascular system: Rate controlled, S1-S2 heard gastrointestinal system: Abdomen is nondistended, soft and nontender. Bowel sounds are heard  extremities: Bilateral lower extremity trace edema present; no clubbing  Data Reviewed: I have personally reviewed following labs and imaging studies  CBC: Recent Labs  Lab 12/21/20 0155 12/21/20 1359 12/22/20 0944 12/22/20 1740 12/23/20 0203 12/24/20 0106 12/26/20 0126  WBC 8.7  --   --   --  7.7 6.7 7.5  NEUTROABS 7.6  --   --   --  6.2 5.1  --   HGB 9.9*   < > 8.8* 8.6* 8.3* 7.8* 7.9*  HCT 29.6*   < > 28.7* 28.2* 26.1* 25.8* 24.9*  MCV 74.2*  --   --   --  77.0* 77.9* 76.4*  PLT 221  --   --   --  162 155 183   < > = values in this interval not displayed.   Basic Metabolic Panel: Recent Labs  Lab 12/23/20 0203 12/24/20 0106 12/25/20 0147 12/26/20 0126 12/27/20 0321  NA  134* 132* 134* 131* 135  K 4.0 3.8 4.2 4.1 4.0  CL 97* 94* 95* 95* 99  CO2 '25 25 25 23 25  '$ GLUCOSE 88 117* 111* 128* 112*  BUN 54* 73* 45* 70* 37*  CREATININE 4.31* 5.22* 4.32* 5.16* 3.30*  CALCIUM 8.1* 8.5* 8.4* 8.4* 8.6*  PHOS 5.8* 6.6* 4.6 4.7* 3.2   GFR: Estimated Creatinine Clearance: 22.3 mL/min (A) (by C-G formula based on SCr of 3.3 mg/dL (H)). Liver Function Tests: Recent Labs  Lab 12/23/20 0203 12/24/20 0106 12/25/20 0147 12/26/20 0126 12/27/20 0321  ALBUMIN 2.6* 2.5* 2.6* 2.6* 2.7*   No results for input(s): LIPASE, AMYLASE in the last 168 hours. No results for input(s): AMMONIA in the last 168 hours. Coagulation Profile: No results for input(s): INR, PROTIME in the last 168 hours. Cardiac Enzymes: No results for input(s): CKTOTAL, CKMB, CKMBINDEX, TROPONINI in the last 168 hours. BNP (last 3 results) No results  for input(s): PROBNP in the last 8760 hours. HbA1C: No results for input(s): HGBA1C in the last 72 hours. CBG: Recent Labs  Lab 12/25/20 2156 12/26/20 0800 12/26/20 1209 12/26/20 2056 12/27/20 0750  GLUCAP 198* 133* 135* 190* 120*   Lipid Profile: No results for input(s): CHOL, HDL, LDLCALC, TRIG, CHOLHDL, LDLDIRECT in the last 72 hours. Thyroid Function Tests: No results for input(s): TSH, T4TOTAL, FREET4, T3FREE, THYROIDAB in the last 72 hours. Anemia Panel: No results for input(s): VITAMINB12, FOLATE, FERRITIN, TIBC, IRON, RETICCTPCT in the last 72 hours. Sepsis Labs: No results for input(s): PROCALCITON, LATICACIDVEN in the last 168 hours.  Recent Results (from the past 240 hour(s))  Surgical PCR screen     Status: None   Collection Time: 12/18/20  6:34 AM   Specimen: Nasal Mucosa; Nasal Swab  Result Value Ref Range Status   MRSA, PCR NEGATIVE NEGATIVE Final   Staphylococcus aureus NEGATIVE NEGATIVE Final    Comment: (NOTE) The Xpert SA Assay (FDA approved for NASAL specimens in patients 17 years of age and older), is one component of  a comprehensive surveillance program. It is not intended to diagnose infection nor to guide or monitor treatment. Performed at [facility], [place] [address] [place], [place] [address]          Radiology Studies: No results found.      Scheduled Meds: . atorvastatin  40 mg Oral q1800  . Chlorhexidine Gluconate Cloth  6 each Topical Q0600  . Chlorhexidine Gluconate Cloth  6 each Topical Q0600  . Chlorhexidine Gluconate Cloth  6 each Topical Q0600  . Chlorhexidine Gluconate Cloth  6 each Topical Q0600  . citalopram  10 mg Oral Daily  . darbepoetin (ARANESP) injection - DIALYSIS  40 mcg Intravenous Q Tue-HD  . feeding supplement (NEPRO CARB STEADY)  237 mL Oral BID BM  . hydrALAZINE  100 mg Oral Q8H  . influenza vac split quadrivalent PF  0.5 mL Intramuscular Tomorrow-1000  . insulin aspart  0-9 Units Subcutaneous TID WC  . isosorbide mononitrate  60 mg Oral Daily  . labetalol  200 mg Oral BID  . melatonin  5 mg Oral QHS  . multivitamin  1 tablet Oral QHS  . pantoprazole  40 mg Oral BID  . pneumococcal 23 valent vaccine  0.5 mL Intramuscular Tomorrow-1000  . polyethylene glycol  17 g Oral Daily  . sevelamer carbonate  800 mg Oral TID WC  . sodium chloride flush  3 mL Intravenous Q12H   Continuous Infusions: . sodium chloride            Aline August, MD [facility] 12/27/2020, 8:05 AM

## 2020-12-27 NOTE — Progress Notes (Signed)
[facility] ROUNDING NOTE   Subjective:   Had 0.3 L UOP over 2/26.  Last HD on 2/26 with 1 kg UF.  Feels ok.  Thinks his left arm swelling is down a bit.  HD going alright here.  Review of systems:  Denies shortness of breath or chest pain Denies n/v  ---------------- Brief history: This is a 65 year old gentleman with history of diabetes hypertension which has been malignant times, hyperlipidemia history of stroke, gout stage IV chronic kidney disease with a baseline serum creatinine by 2.05 August 2020.  He presented with dyspnea and lower extremity swelling.  He was also found to have a blood pressure was elevated to [id number] systolic.  His blood sugar was 320 and creatinine elevated at 5 mg/dL with chest x-ray which was consistent with pulmonary edema.  There is reported history of nonsteroidal anti-inflammatory drug use. Urinalysis showed no white blood cells no red blood cells.  There was greater than 300 mg of proteinuria noted on 08/25/2020 however only 100 mg/dL noted on 12/15/2020.  Renal ultrasound showed increased bilateral renal echogenicity no hydronephrosis.  Kidney size appears to be small bilaterally.     Objective:  Vital signs in last 24 hours:  Temp:  [98.1 F (36.7 C)-98.2 F (36.8 C)] 98.2 F (36.8 C) (02/27 0748) Pulse Rate:  [59-82] 77 (02/27 0748) Resp:  [14-16] 16 (02/27 0748) BP: (125-154)/(55-96) 148/67 (02/27 0748) SpO2:  [91 %-95 %] 94 % (02/27 0748)  Weight change:  Filed Weights   12/24/20 1430 12/25/20 0559 12/26/20 0500  Weight: 69.6 kg 70.8 kg 72 kg    Intake/Output: I/O last 3 completed shifts: In: 360 [P.O.:360] Out: 2025 [Urine:1025; Other:1000]   Intake/Output this shift:  No intake/output data recorded.  GEN: adult male in bed in NAD ENT: no nasal discharge, mmm EYES: no scleral icterus, eomi CV: S1S2 no rub PULM: clear and unlabored.  ABD: soft/NT/ND SKIN: no rashes or jaundice  EXT: no edema, warm and well  perfused Access RIJ tunn catheter; lue AVF bruit and thrill   Basic Metabolic Panel: Recent Labs  Lab 12/23/20 0203 12/24/20 0106 12/25/20 0147 12/26/20 0126 12/27/20 0321  NA 134* 132* 134* 131* 135  K 4.0 3.8 4.2 4.1 4.0  CL 97* 94* 95* 95* 99  CO2 '25 25 25 23 25  '$ GLUCOSE 88 117* 111* 128* 112*  BUN 54* 73* 45* 70* 37*  CREATININE 4.31* 5.22* 4.32* 5.16* 3.30*  CALCIUM 8.1* 8.5* 8.4* 8.4* 8.6*  PHOS 5.8* 6.6* 4.6 4.7* 3.2    Liver Function Tests: Recent Labs  Lab 12/23/20 0203 12/24/20 0106 12/25/20 0147 12/26/20 0126 12/27/20 0321  ALBUMIN 2.6* 2.5* 2.6* 2.6* 2.7*   CBC: Recent Labs  Lab 12/21/20 0155 12/21/20 1359 12/22/20 0944 12/22/20 1740 12/23/20 0203 12/24/20 0106 12/26/20 0126  WBC 8.7  --   --   --  7.7 6.7 7.5  NEUTROABS 7.6  --   --   --  6.2 5.1  --   HGB 9.9*   < > 8.8* 8.6* 8.3* 7.8* 7.9*  HCT 29.6*   < > 28.7* 28.2* 26.1* 25.8* 24.9*  MCV 74.2*  --   --   --  77.0* 77.9* 76.4*  PLT 221  --   --   --  162 155 183   < > = values in this interval not displayed.    Microbiology: Results for orders placed or performed during the hospital encounter of 12/14/20  Resp Panel by RT-PCR (  Flu A&B, Covid) Nasopharyngeal Swab     Status: None   Collection Time: 12/14/20 11:00 AM   Specimen: Nasopharyngeal Swab; Nasopharyngeal(NP) swabs in vial transport medium  Result Value Ref Range Status   SARS Coronavirus 2 by RT PCR NEGATIVE NEGATIVE Final    Comment: (NOTE) SARS-CoV-2 target nucleic acids are NOT DETECTED.  The SARS-CoV-2 RNA is generally detectable in upper respiratory specimens during the acute phase of infection. The lowest concentration of SARS-CoV-2 viral copies this assay can detect is 138 copies/mL. A negative result does not preclude SARS-Cov-2 infection and should not be used as the sole basis for treatment or other patient management decisions. A negative result may occur with  improper specimen collection/handling, submission of  specimen other than nasopharyngeal swab, presence of viral mutation(s) within the areas targeted by this assay, and inadequate number of viral copies(<138 copies/mL). A negative result must be combined with clinical observations, patient history, and epidemiological information. The expected result is Negative.  Fact Sheet for Patients:  EntrepreneurPulse.com.au  Fact Sheet for Healthcare Providers:  IncredibleEmployment.be  This test is no t yet approved or cleared by the [place] FDA and  has been authorized for detection and/or diagnosis of SARS-CoV-2 by FDA under an Emergency Use Authorization (EUA). This EUA will remain  in effect (meaning this test can be used) for the duration of the COVID-19 declaration under Section 564(b)(1) of the Act, 21 U.S.C.section 360bbb-3(b)(1), unless the authorization is terminated  or revoked sooner.       Influenza A by PCR NEGATIVE NEGATIVE Final   Influenza B by PCR NEGATIVE NEGATIVE Final    Comment: (NOTE) The Xpert Xpress SARS-CoV-2/FLU/RSV plus assay is intended as an aid in the diagnosis of influenza from Nasopharyngeal swab specimens and should not be used as a sole basis for treatment. Nasal washings and aspirates are unacceptable for Xpert Xpress SARS-CoV-2/FLU/RSV testing.  Fact Sheet for Patients: EntrepreneurPulse.com.au  Fact Sheet for Healthcare Providers: IncredibleEmployment.be  This test is not yet approved or cleared by the [place] FDA and has been authorized for detection and/or diagnosis of SARS-CoV-2 by FDA under an Emergency Use Authorization (EUA). This EUA will remain in effect (meaning this test can be used) for the duration of the COVID-19 declaration under Section 564(b)(1) of the Act, 21 U.S.C. section 360bbb-3(b)(1), unless the authorization is terminated or revoked.  Performed at [facility], [place] [address]  [place], [place] [address]   Surgical PCR screen     Status: None   Collection Time: 12/18/20  6:34 AM   Specimen: Nasal Mucosa; Nasal Swab  Result Value Ref Range Status   MRSA, PCR NEGATIVE NEGATIVE Final   Staphylococcus aureus NEGATIVE NEGATIVE Final    Comment: (NOTE) The Xpert SA Assay (FDA approved for NASAL specimens in patients 93 years of age and older), is one component of a comprehensive surveillance program. It is not intended to diagnose infection nor to guide or monitor treatment. Performed at [facility], [place] [address] [place],  [address]      Imaging: No results found.   Medications:   . sodium chloride     . atorvastatin  40 mg Oral q1800  . Chlorhexidine Gluconate Cloth  6 each Topical Q0600  . Chlorhexidine Gluconate Cloth  6 each Topical Q0600  . Chlorhexidine Gluconate Cloth  6 each Topical Q0600  . Chlorhexidine Gluconate Cloth  6 each Topical Q0600  . citalopram  10 mg Oral Daily  .  darbepoetin (ARANESP) injection - DIALYSIS  40 mcg Intravenous Q Tue-HD  . feeding supplement (NEPRO CARB STEADY)  237 mL Oral BID BM  . hydrALAZINE  100 mg Oral Q8H  . influenza vac split quadrivalent PF  0.5 mL Intramuscular Tomorrow-1000  . insulin aspart  0-9 Units Subcutaneous TID WC  . isosorbide mononitrate  60 mg Oral Daily  . labetalol  200 mg Oral BID  . melatonin  5 mg Oral QHS  . multivitamin  1 tablet Oral QHS  . pantoprazole  40 mg Oral BID  . pneumococcal 23 valent vaccine  0.5 mL Intramuscular Tomorrow-1000  . polyethylene glycol  17 g Oral Daily  . sevelamer carbonate  800 mg Oral TID WC  . sodium chloride flush  3 mL Intravenous Q12H   sodium chloride, acetaminophen, benzonatate, chlorpheniramine-HYDROcodone, guaiFENesin-dextromethorphan, heparin sodium (porcine), levalbuterol, LORazepam, ondansetron (ZOFRAN) IV, oxyCODONE-acetaminophen, sodium chloride flush  Assessment/ Plan:  1.AKI on CKD: Creatinine 2.6 in October 2021.  Rapidly  progressing kidney loss.  Ultrasound with small kidneys.  Possible renal artery stenosis based on kidney size.  RIJ tunn catheter and LUE avf on 2/21 with vascular.  First HD on 2/21 - Has been on HD per TTS schedule  - Monitor for signs of recovery - seems guarded - Have started CLIP process as AKI  - no spot yet  2. Hypertension/volume  -malignant hypertension on arrival with flash pulmonary edema intermittently.  Blood pressure improved.  Discontinued renal artery duplex as still not done and HTN controlled.  Note have stopped minoxidil.    3. Anemia CKD - s/p feraheme; aranesp 40 mcg once on 2/22 and dose increased to 100 mcg for next dose 3/1 Tuesday. May need PRBC's soon.  4.  Hypokalemia- resolved with supplementation  5.  A. Fib- has been controlled here recently.  could be causing some variable renal perfusion-cardiology has seen   6. Hyponatremia: mild Likely related to free water retention in setting of kidney disease.  Improved with HD  7. Metabolic bone disease - intact PTH 169.  on sevelamer. On renal diet. Improved phos.   dispo - awaiting outpatient HD unit and a SNF  Claudia Desanctis, MD 12/27/2020 11:24 AM

## 2020-12-28 DIAGNOSIS — N186 End stage renal disease: Secondary | ICD-10-CM | POA: Diagnosis not present

## 2020-12-28 DIAGNOSIS — I5033 Acute on chronic diastolic (congestive) heart failure: Secondary | ICD-10-CM | POA: Diagnosis not present

## 2020-12-28 DIAGNOSIS — N185 Chronic kidney disease, stage 5: Secondary | ICD-10-CM | POA: Diagnosis not present

## 2020-12-28 DIAGNOSIS — N179 Acute kidney failure, unspecified: Secondary | ICD-10-CM | POA: Diagnosis not present

## 2020-12-28 LAB — GLUCOSE, CAPILLARY
Glucose-Capillary: 106 mg/dL — ABNORMAL HIGH (ref 70–99)
Glucose-Capillary: 149 mg/dL — ABNORMAL HIGH (ref 70–99)
Glucose-Capillary: 144 mg/dL — ABNORMAL HIGH (ref 70–99)
Glucose-Capillary: 90 mg/dL (ref 70–99)

## 2020-12-28 NOTE — Progress Notes (Signed)
Patient ID: Eian Joachim., male   DOB: Aug 16, 1956, 65 y.o.   MRN: [id number] S: No complaints O:BP 139/67 (BP Location: Right Arm)   Pulse 73   Temp 98.9 F (37.2 C) (Oral)   Resp 16   Ht '5\' 9"'$  (1.753 m)   Wt 71.7 kg   SpO2 97%   BMI 23.34 kg/m   Intake/Output Summary (Last 24 hours) at 12/28/2020 1226 Last data filed at 12/28/2020 0300 Gross per 24 hour  Intake 120 ml  Output 650 ml  Net -530 ml   Intake/Output: I/O last 3 completed shifts: In: 240 [P.O.:240] Out: 975 [Urine:975]  Intake/Output this shift:  No intake/output data recorded. Weight change:  Gen: NAD CVS: RRR Resp: cta Abd: benign Ext: no edema, LUE AVG +T/B  Recent Labs  Lab 12/21/20 1507 12/23/20 0203 12/24/20 0106 12/25/20 0147 12/26/20 0126 12/27/20 0321  NA 135 134* 132* 134* 131* 135  K 4.1 4.0 3.8 4.2 4.1 4.0  CL 96* 97* 94* 95* 95* 99  CO2 '22 25 25 25 23 25  '$ GLUCOSE 166* 88 117* 111* 128* 112*  BUN 145* 54* 73* 45* 70* 37*  CREATININE 7.09* 4.31* 5.22* 4.32* 5.16* 3.30*  ALBUMIN 2.8* 2.6* 2.5* 2.6* 2.6* 2.7*  CALCIUM 8.0* 8.1* 8.5* 8.4* 8.4* 8.6*  PHOS 10.2* 5.8* 6.6* 4.6 4.7* 3.2   Liver Function Tests: Recent Labs  Lab 12/25/20 0147 12/26/20 0126 12/27/20 0321  ALBUMIN 2.6* 2.6* 2.7*   No results for input(s): LIPASE, AMYLASE in the last 168 hours. No results for input(s): AMMONIA in the last 168 hours. CBC: Recent Labs  Lab 12/23/20 0203 12/24/20 0106 12/26/20 0126  WBC 7.7 6.7 7.5  NEUTROABS 6.2 5.1  --   HGB 8.3* 7.8* 7.9*  HCT 26.1* 25.8* 24.9*  MCV 77.0* 77.9* 76.4*  PLT 162 155 183   Cardiac Enzymes: No results for input(s): CKTOTAL, CKMB, CKMBINDEX, TROPONINI in the last 168 hours. CBG: Recent Labs  Lab 12/27/20 1220 12/27/20 1730 12/27/20 2011 12/28/20 0731 12/28/20 1208  GLUCAP 122* 91 159* 106* 149*    Iron Studies: No results for input(s): IRON, TIBC, TRANSFERRIN, FERRITIN in the last 72 hours. Studies/Results: No results found. Marland Kitchen  atorvastatin  40 mg Oral q1800  . Chlorhexidine Gluconate Cloth  6 each Topical Q0600  . Chlorhexidine Gluconate Cloth  6 each Topical Q0600  . Chlorhexidine Gluconate Cloth  6 each Topical Q0600  . Chlorhexidine Gluconate Cloth  6 each Topical Q0600  . citalopram  10 mg Oral Daily  . [START ON 12/29/2020] darbepoetin (ARANESP) injection - DIALYSIS  100 mcg Intravenous Q Tue-HD  . feeding supplement (NEPRO CARB STEADY)  237 mL Oral BID BM  . hydrALAZINE  100 mg Oral Q8H  . influenza vac split quadrivalent PF  0.5 mL Intramuscular Tomorrow-1000  . insulin aspart  0-9 Units Subcutaneous TID WC  . isosorbide mononitrate  60 mg Oral Daily  . labetalol  200 mg Oral BID  . melatonin  5 mg Oral QHS  . multivitamin  1 tablet Oral QHS  . pantoprazole  40 mg Oral BID  . pneumococcal 23 valent vaccine  0.5 mL Intramuscular Tomorrow-1000  . polyethylene glycol  17 g Oral Daily  . sevelamer carbonate  800 mg Oral TID WC  . sodium chloride flush  3 mL Intravenous Q12H    BMET    Component Value Date/Time   NA 135 12/27/2020 0321   NA 136 08/06/2019 1400   K 4.0  12/27/2020 0321   CL 99 12/27/2020 0321   CO2 25 12/27/2020 0321   GLUCOSE 112 (H) 12/27/2020 0321   BUN 37 (H) 12/27/2020 0321   BUN 20 08/06/2019 1400   CREATININE 3.30 (H) 12/27/2020 0321   CREATININE 1.28 08/19/2015 0844   CALCIUM 8.6 (L) 12/27/2020 0321   GFRNONAA 20 (L) 12/27/2020 0321   GFRNONAA 76 12/01/2014 1037   GFRAA 33 (L) 08/06/2019 1400   GFRAA 88 12/01/2014 1037   CBC    Component Value Date/Time   WBC 7.5 12/26/2020 0126   RBC 3.26 (L) 12/26/2020 0126   HGB 7.9 (L) 12/26/2020 0126   HGB 13.4 09/04/2018 1211   HCT 24.9 (L) 12/26/2020 0126   HCT 41.1 09/04/2018 1211   PLT 183 12/26/2020 0126   PLT 190 09/04/2018 1211   MCV 76.4 (L) 12/26/2020 0126   MCV 71 (L) 09/04/2018 1211   MCH 24.2 (L) 12/26/2020 0126   MCHC 31.7 12/26/2020 0126   RDW 15.4 12/26/2020 0126   RDW 15.8 (H) 09/04/2018 1211   LYMPHSABS  0.8 12/24/2020 0106   MONOABS 0.6 12/24/2020 0106   EOSABS 0.1 12/24/2020 0106   BASOSABS 0.0 12/24/2020 0106     Assessment/Plan:  1.AKI on CKD: Creatinine 2.6 in October 2021.  Rapidly progressing kidney loss.  Ultrasound with small kidneys.  Possible renal artery stenosis based on kidney size.  RIJ tunn catheter and LUE avG on 2/21 with vascular.  First HD on 2/21 - Has been on HD per TTS schedule  - Monitor for signs of recovery - seems guarded - Have started CLIP process as AKI  - no spot yet and awaiting SNF placement.  2. Hypertension/volume-malignant hypertension on arrival with flash pulmonary edema intermittently. Blood pressure improved.  Discontinued renal artery duplex as still not done and HTN controlled.  Note have stopped minoxidil.    3. AnemiaCKD - s/p feraheme; aranesp 40 mcg once on 2/22 and dose increased to 100 mcg for next dose 3/1 Tuesday. May need PRBC's soon.  4.Hypokalemia- resolved with supplementation  5.A. Fib-has been controlled here recently.  could be causing some variable renal perfusion-cardiology has seen   6. Hyponatremia: mild Likely related to free water retention in setting of kidney disease.  Improved with HD  7. Metabolic bone disease - intact PTH 169.  on sevelamer. On renal diet. Improved phos.   8. Disposition  - awaiting outpatient HD unit and a SNF [name], MD [facility] [phone]

## 2020-12-28 NOTE — Progress Notes (Signed)
Patient ID: Ruben Camacho., male   DOB: 24-Jul-1956, 65 y.o.   MRN: [id number]  PROGRESS NOTE    [name].  [id number] DOB: 1956/07/25 DOA: 12/14/2020 PCP: Horald Pollen, MD   Brief Narrative:  65 year old male with history of hypertension, chronic kidney disease stage IV, diabetes mellitus type 2, hyperlipidemia presented with worsening shortness of breath.  On presentation, patient was found to have pulmonary edema along with acute kidney injury on chronic kidney disease with creatinine of five compared to baseline of 2.6 along with elevated troponin and chest x-ray showing pulmonary edema.  He was started on intravenous Lasix.  Nephrology was also consulted.  Subsequently, he had coffee-ground emesis for which heparin and aspirin were stopped and he was started on Protonix.  GI recommended EGD but patient refused.  During the hospitalization, renal function did not improve and patient was started on hemodialysis on 12/21/2020.  PT has recommended SNF placement.  Patient is currently medically stable for discharge.  Assessment & Plan:   Acute kidney injury on chronic renal disease stage IV -Baseline creatinine 2.6.  Presented with creatinine of 5.  Treated with intravenous diuretics but kidney function did not improve.  Renal ultrasound showed small kidneys -Nephrology following.  Patient was started on hemodialysis on 12/21/2020 -He underwent tunneled dialysis catheter placement and new left upper arm AV graft on 12/21/2020 by vascular surgery.  Outpatient follow-up with vascular surgery -Dialysis as per nephrology schedule  Acute on chronic diastolic CHF Hypertensive emergency Elevated troponins: Most likely from demand ischemia -Patient initially required Cardene drip which was subsequently discontinued. -Blood pressure has improved and intermittently still on the higher side.  Continue isosorbide mononitrate, labetalol, minoxidil -Volume currently being managed by  dialysis by nephrology.  Cardiology has signed off. -Strict input and output.  Daily weights.  Fluid restriction. -Echo showed EF of 65 to 70%.  Continue statins  Acute hypoxic respiratory failure -Resolved.  Currently on room air.  Off steroids.  Continue incentive spirometry and as needed nebs  Possible upper GI bleeding -Patient had coffee-ground emesis during this hospitalization. -heparin and aspirin were stopped and he was started on Protonix.  GI recommended EGD but patient refused.   -Subsequently, hemoglobin has stabilized.  Continue twice a day oral Protonix.  Transfuse if hemoglobin is less than 7  Anxiety/depression -Continue Celexa  Hyponatremia -Now being managed by dialysis by nephrology. Improving.  Chronic microcytic anemia -Hemoglobin stable.  Monitor intermittently  Diabetes mellitus type 2 with hyperglycemia -Oral meds on hold.  Continue CBGs with SSI  Severe malnutrition -Follow nutrition recommendation  Generalized conditioning -PT recommends SNF placement.  Social worker following.  DVT prophylaxis: SCDs Code Status: Full Family Communication: None Disposition Plan: Status is: Inpatient  Remains inpatient appropriate because:Hemodynamically unstable and Inpatient level of care appropriate due to severity of illness.     Dispo: The patient is from: Home              Anticipated d/c is to: SNF              Anticipated d/c date is: 1 day              Patient currently is medically stable to d/c.   Difficult to place patient No   Consultants: Nephrology/vascular surgery/cardiology/GI  Procedures:  tunneled dialysis catheter placement and new left upper arm AV graft on 12/21/2020 by vascular surgery Antimicrobials:  Anti-infectives (From admission, onward)   Start     Dose/Rate Route Frequency  Ordered Stop   12/18/20 0000  ceFAZolin (ANCEF) IVPB 1 g/50 mL premix  Status:  Discontinued       Note to Pharmacy: Send with pt to OR   1 g 100 mL/hr  over 30 Minutes Intravenous On call 12/17/20 1511 12/17/20 1513   12/18/20 0000  ceFAZolin (ANCEF) IVPB 2g/100 mL premix       Note to Pharmacy: Send with pt to OR   2 g 200 mL/hr over 30 Minutes Intravenous On call 12/17/20 1513 12/19/20 0000       Subjective: Patient seen and examined at bedside.  No complaints.  No overnight fever, vomiting or worsening shortness of breath reported.   Objective: Vitals:   12/27/20 1411 12/27/20 2011 12/27/20 2219 12/28/20 0434  BP: 125/64 134/67 (!) 144/65 139/67  Pulse: 73 75 76 73  Resp: '19 16  16  '$ Temp: 97.7 F (36.5 C) 99.3 F (37.4 C)  98.9 F (37.2 C)  TempSrc: Axillary Oral  Oral  SpO2: 95% 96%  97%  Weight:    71.7 kg  Height:        Intake/Output Summary (Last 24 hours) at 12/28/2020 0755 Last data filed at 12/28/2020 0300 Gross per 24 hour  Intake 120 ml  Output 650 ml  Net -530 ml   Filed Weights   12/25/20 0559 12/26/20 0500 12/28/20 0434  Weight: 70.8 kg 72 kg 71.7 kg    Examination:  General exam: On room air currently.  No distress.  Looking chronically.  Poor historian Respiratory system: Bilateral decreased breath sounds at bases with some crackles cardiovascular system: S1-S2 heard, rate controlled gastrointestinal system: Abdomen is slightly distended, soft and nontender.  Normal bowel sounds are heard  extremities: No cyanosis; lower extremity edema present bilaterally  Data Reviewed: I have personally reviewed following labs and imaging studies  CBC: Recent Labs  Lab 12/22/20 0944 12/22/20 1740 12/23/20 0203 12/24/20 0106 12/26/20 0126  WBC  --   --  7.7 6.7 7.5  NEUTROABS  --   --  6.2 5.1  --   HGB 8.8* 8.6* 8.3* 7.8* 7.9*  HCT 28.7* 28.2* 26.1* 25.8* 24.9*  MCV  --   --  77.0* 77.9* 76.4*  PLT  --   --  162 155 [id number]   Basic Metabolic Panel: Recent Labs  Lab 12/23/20 0203 12/24/20 0106 12/25/20 0147 12/26/20 0126 12/27/20 0321  NA 134* 132* 134* 131* 135  K 4.0 3.8 4.2 4.1 4.0  CL 97* 94*  95* 95* 99  CO2 '25 25 25 23 25  '$ GLUCOSE 88 117* 111* 128* 112*  BUN 54* 73* 45* 70* 37*  CREATININE 4.31* 5.22* 4.32* 5.16* 3.30*  CALCIUM 8.1* 8.5* 8.4* 8.4* 8.6*  PHOS 5.8* 6.6* 4.6 4.7* 3.2   GFR: Estimated Creatinine Clearance: 22.3 mL/min (A) (by C-G formula based on SCr of 3.3 mg/dL (H)). Liver Function Tests: Recent Labs  Lab 12/23/20 0203 12/24/20 0106 12/25/20 0147 12/26/20 0126 12/27/20 0321  ALBUMIN 2.6* 2.5* 2.6* 2.6* 2.7*   No results for input(s): LIPASE, AMYLASE in the last 168 hours. No results for input(s): AMMONIA in the last 168 hours. Coagulation Profile: No results for input(s): INR, PROTIME in the last 168 hours. Cardiac Enzymes: No results for input(s): CKTOTAL, CKMB, CKMBINDEX, TROPONINI in the last 168 hours. BNP (last 3 results) No results for input(s): PROBNP in the last 8760 hours. HbA1C: No results for input(s): HGBA1C in the last 72 hours. CBG: Recent Labs  Lab 12/27/20  0750 12/27/20 1220 12/27/20 1730 12/27/20 2011 12/28/20 0731  GLUCAP 120* 122* 91 159* 106*   Lipid Profile: No results for input(s): CHOL, HDL, LDLCALC, TRIG, CHOLHDL, LDLDIRECT in the last 72 hours. Thyroid Function Tests: No results for input(s): TSH, T4TOTAL, FREET4, T3FREE, THYROIDAB in the last 72 hours. Anemia Panel: No results for input(s): VITAMINB12, FOLATE, FERRITIN, TIBC, IRON, RETICCTPCT in the last 72 hours. Sepsis Labs: No results for input(s): PROCALCITON, LATICACIDVEN in the last 168 hours.  No results found for this or any previous visit (from the past 240 hour(s)).       Radiology Studies: No results found.      Scheduled Meds: . atorvastatin  40 mg Oral q1800  . Chlorhexidine Gluconate Cloth  6 each Topical Q0600  . Chlorhexidine Gluconate Cloth  6 each Topical Q0600  . Chlorhexidine Gluconate Cloth  6 each Topical Q0600  . Chlorhexidine Gluconate Cloth  6 each Topical Q0600  . citalopram  10 mg Oral Daily  . [START ON 12/29/2020]  darbepoetin (ARANESP) injection - DIALYSIS  100 mcg Intravenous Q Tue-HD  . feeding supplement (NEPRO CARB STEADY)  237 mL Oral BID BM  . hydrALAZINE  100 mg Oral Q8H  . influenza vac split quadrivalent PF  0.5 mL Intramuscular Tomorrow-1000  . insulin aspart  0-9 Units Subcutaneous TID WC  . isosorbide mononitrate  60 mg Oral Daily  . labetalol  200 mg Oral BID  . melatonin  5 mg Oral QHS  . multivitamin  1 tablet Oral QHS  . pantoprazole  40 mg Oral BID  . pneumococcal 23 valent vaccine  0.5 mL Intramuscular Tomorrow-1000  . polyethylene glycol  17 g Oral Daily  . sevelamer carbonate  800 mg Oral TID WC  . sodium chloride flush  3 mL Intravenous Q12H   Continuous Infusions: . sodium chloride            Aline August, MD [facility] 12/28/2020, 7:55 AM
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
## 2020-12-28 NOTE — Progress Notes (Signed)
Physical Therapy Treatment Patient Details Name: Ruben Camacho. MRN: [id number] DOB: December 29, 1955 Today's Date: 12/28/2020    History of Present Illness Pt is a 65 y.o. male admitted 12/14/20 with SOB; workup for acute on chronic CHF, hypertensive urgency, AKI on CKD. Pt with coffee ground emesis; GI consult, but pt declined EGD. S/p tunneled RIJ HD cath and LUE AVG placement 2/21; HD initiated 2/21. PMH includes HTN, CKD, DM, CVA, gout.   PT Comments    Pt slowly progressing with mobility. Pt remains limited by generalized weakness, decreased activity tolerance and c/o dizziness when upright; does not seem to be related to orthostatic hypotension or vestibular issue. Pt requires minA for limited mobility, unable to maintain standing >15-sec before needing to sit. Continue to recommend SNF-level therapies to maximize functional mobility and independence.   Follow Up Recommendations  SNF;Supervision for mobility/OOB     Equipment Recommendations  Rolling walker with 5" wheels;3in1 (PT)    Recommendations for Other Services       Precautions / Restrictions Precautions Precautions: Fall    Mobility  Bed Mobility Overal bed mobility: Modified Independent Bed Mobility: Supine to Sit                Transfers Overall transfer level: Needs assistance Equipment used: Rolling walker (2 wheeled) Transfers: Sit to/from Stand Sit to Stand: Min assist         General transfer comment: Pt reliant on momentum to power into standing, minA to stabilize RW as pt pulling up on it despite cues for hand placement/sequencing; initial LOB posteriorly with return to sitting EOB  Ambulation/Gait Ambulation/Gait assistance: Min assist Gait Distance (Feet): 2 Feet Assistive device: Rolling walker (2 wheeled) Gait Pattern/deviations: Step-to pattern;Leaning posteriorly;Trunk flexed     General Gait Details: Unsteady steps to recliner with RW and minA for balance; pt stepping outside of  walker with posterior lean; poor awareness of RW management; c/o dizziness with BP 131/58   Stairs             Wheelchair Mobility    Modified Rankin (Stroke Patients Only)       Balance Overall balance assessment: Needs assistance Sitting-balance support: Feet supported Sitting balance-Leahy Scale: Fair       Standing balance-Leahy Scale: Poor                              Cognition Arousal/Alertness: Awake/alert Behavior During Therapy: Flat affect Overall Cognitive Status: No family/caregiver present to determine baseline cognitive functioning                                 General Comments: difficulty sequencing with RW despite education and cues      Exercises      General Comments General comments (skin integrity, edema, etc.): Post-transfer BP 131/58; BP taken again in standing but pt unable to maintain standing >15-sec; post-stand SBP 128      Pertinent Vitals/Pain Pain Assessment: No/denies pain    Home Living                      Prior Function            PT Goals (current goals can now be found in the care plan section) Progress towards PT goals: Progressing toward goals    Frequency    Min 2X/week  PT Plan Current plan remains appropriate    Co-evaluation              AM-PAC PT "6 Clicks" Mobility   Outcome Measure  Help needed turning from your back to your side while in a flat bed without using bedrails?: None Help needed moving from lying on your back to sitting on the side of a flat bed without using bedrails?: None Help needed moving to and from a bed to a chair (including a wheelchair)?: A Little Help needed standing up from a chair using your arms (e.g., wheelchair or bedside chair)?: A Little Help needed to walk in hospital room?: A Lot Help needed climbing 3-5 steps with a railing? : A Lot 6 Click Score: 18    End of Session Equipment Utilized During Treatment: Gait  belt Activity Tolerance: Patient limited by fatigue Patient left: in chair;with call bell/phone within reach;with chair alarm set Nurse Communication: Mobility status PT Visit Diagnosis: Difficulty in walking, not elsewhere classified (R26.2);Dizziness and giddiness (R42)     Time: [id number] PT Time Calculation (min) (ACUTE ONLY): 14 min  Charges:  $Therapeutic Activity: 8-22 mins                     Mabeline Caras, PT, DPT Acute Rehabilitation Services  Pager [phone] Office [place] 12/28/2020, 9:50 AM

## 2020-12-29 DIAGNOSIS — I5033 Acute on chronic diastolic (congestive) heart failure: Secondary | ICD-10-CM | POA: Diagnosis not present

## 2020-12-29 DIAGNOSIS — N186 End stage renal disease: Secondary | ICD-10-CM

## 2020-12-29 DIAGNOSIS — Z992 Dependence on renal dialysis: Secondary | ICD-10-CM | POA: Diagnosis not present

## 2020-12-29 DIAGNOSIS — N179 Acute kidney failure, unspecified: Secondary | ICD-10-CM | POA: Diagnosis not present

## 2020-12-29 LAB — RENAL FUNCTION PANEL
Albumin: 2.6 g/dL — ABNORMAL LOW (ref 3.5–5.0)
Anion gap: 12 (ref 5–15)
BUN: 81 mg/dL — ABNORMAL HIGH (ref 8–23)
CO2: 24 mmol/L (ref 22–32)
Calcium: 8.8 mg/dL — ABNORMAL LOW (ref 8.9–10.3)
Chloride: 99 mmol/L (ref 98–111)
Creatinine, Ser: 5.21 mg/dL — ABNORMAL HIGH (ref 0.61–1.24)
GFR, Estimated: 12 mL/min — ABNORMAL LOW (ref >60)
Glucose, Bld: 102 mg/dL — ABNORMAL HIGH (ref 70–99)
Phosphorus: 5 mg/dL — ABNORMAL HIGH (ref 2.5–4.6)
Potassium: 3.9 mmol/L (ref 3.5–5.1)
Sodium: 135 mmol/L (ref 135–145)

## 2020-12-29 LAB — CBC
HCT: 26.9 % — ABNORMAL LOW (ref 39.0–52.0)
Hemoglobin: 8.1 g/dL — ABNORMAL LOW (ref 13.0–17.0)
MCH: 23.9 pg — ABNORMAL LOW (ref 26.0–34.0)
MCHC: 30.1 g/dL (ref 30.0–36.0)
MCV: 79.4 fL — ABNORMAL LOW (ref 80.0–100.0)
Platelets: 197 10*3/uL (ref 150–400)
RBC: 3.39 MIL/uL — ABNORMAL LOW (ref 4.22–5.81)
RDW: 15.6 % — ABNORMAL HIGH (ref 11.5–15.5)
WBC: 6.4 10*3/uL (ref 4.0–10.5)
nRBC: 0 % (ref 0.0–0.2)

## 2020-12-29 LAB — GLUCOSE, CAPILLARY
Glucose-Capillary: 106 mg/dL — ABNORMAL HIGH (ref 70–99)
Glucose-Capillary: 104 mg/dL — ABNORMAL HIGH (ref 70–99)
Glucose-Capillary: 152 mg/dL — ABNORMAL HIGH (ref 70–99)
Glucose-Capillary: 113 mg/dL — ABNORMAL HIGH (ref 70–99)

## 2020-12-29 MED ORDER — LACTULOSE 10 GM/15ML PO SOLN
20.0000 g | Freq: Two times a day (BID) | ORAL | Status: DC
  Administered 2020-12-29 – 2021-01-03 (×9): 20 g via ORAL
  Filled 2020-12-29 (×10): qty 30

## 2020-12-29 MED ORDER — BISACODYL 10 MG RE SUPP
10.0000 mg | Freq: Every day | RECTAL | Status: DC | PRN
  Administered 2020-12-29: 10 mg via RECTAL
  Filled 2020-12-29: qty 1

## 2020-12-29 MED ORDER — DARBEPOETIN ALFA 100 MCG/0.5ML IJ SOSY
PREFILLED_SYRINGE | INTRAMUSCULAR | Status: AC
  Administered 2020-12-29: 100 ug via INTRAVENOUS
  Filled 2020-12-29: qty 0.5

## 2020-12-29 MED ORDER — POLYETHYLENE GLYCOL 3350 17 G PO PACK
17.0000 g | PACK | Freq: Two times a day (BID) | ORAL | Status: DC
  Administered 2020-12-29 – 2021-01-08 (×10): 17 g via ORAL
  Filled 2020-12-29 (×17): qty 1

## 2020-12-29 MED ORDER — HEPARIN SODIUM (PORCINE) 1000 UNIT/ML IJ SOLN
INTRAMUSCULAR | Status: AC
  Filled 2020-12-29: qty 4

## 2020-12-29 NOTE — Plan of Care (Signed)
  Problem: Education: Goal: Knowledge of General Education information will improve Description: Including pain rating scale, medication(s)/side effects and non-pharmacologic comfort measures Outcome: Progressing   Problem: Health Behavior/Discharge Planning: Goal: Ability to manage health-related needs will improve Outcome: Progressing   Problem: Clinical Measurements: Goal: Ability to maintain clinical measurements within normal limits will improve Outcome: Progressing Goal: Will remain free from infection Outcome: Progressing Goal: Diagnostic test results will improve Outcome: Progressing Goal: Respiratory complications will improve Outcome: Progressing Goal: Cardiovascular complication will be avoided Outcome: Progressing   Problem: Activity: Goal: Risk for activity intolerance will decrease Outcome: Progressing   Problem: Nutrition: Goal: Adequate nutrition will be maintained Outcome: Progressing   Problem: Coping: Goal: Level of anxiety will decrease Outcome: Progressing   Problem: Elimination: Goal: Will not experience complications related to bowel motility Outcome: Progressing Goal: Will not experience complications related to urinary retention Outcome: Progressing   Problem: Pain Managment: Goal: General experience of comfort will improve Outcome: Progressing   Problem: Safety: Goal: Ability to remain free from injury will improve Outcome: Progressing   Problem: Skin Integrity: Goal: Risk for impaired skin integrity will decrease Outcome: Progressing   Problem: Education: Goal: Knowledge of disease and its progression will improve Outcome: Progressing   Problem: Health Behavior/Discharge Planning: Goal: Ability to manage health-related needs will improve Outcome: Progressing   Problem: Clinical Measurements: Goal: Complications related to the disease process or treatment will be avoided or minimized Outcome: Progressing Goal: Dialysis access  will remain free of complications Outcome: Progressing   Problem: Activity: Goal: Activity intolerance will improve Outcome: Progressing   Problem: Fluid Volume: Goal: Fluid volume balance will be maintained or improved Outcome: Progressing   Problem: Nutritional: Goal: Ability to make appropriate dietary choices will improve Outcome: Progressing   Problem: Respiratory: Goal: Respiratory symptoms related to disease process will be avoided Outcome: Progressing   Problem: Self-Concept: Goal: Body image disturbance will be avoided or minimized Outcome: Progressing   Problem: Urinary Elimination: Goal: Progression of disease will be identified and treated Outcome: Progressing   Problem: Education: Goal: Ability to demonstrate management of disease process will improve Outcome: Progressing Goal: Ability to verbalize understanding of medication therapies will improve Outcome: Progressing Goal: Individualized Educational Video(s) Outcome: Progressing   Problem: Activity: Goal: Capacity to carry out activities will improve Outcome: Progressing   Problem: Cardiac: Goal: Ability to achieve and maintain adequate cardiopulmonary perfusion will improve Outcome: Progressing

## 2020-12-29 NOTE — Progress Notes (Signed)
Patient off the unit for dialysis at this time.

## 2020-12-29 NOTE — Progress Notes (Signed)
Patient ID: Ruben Odoms., male   DOB: [birth year], 65 y.o.   MRN: [id number] S: Seen this morning on HD.  No new complaints O:BP (!) 154/62 (BP Location: Right Arm)   Pulse 79   Temp 98.6 F (37 C) (Oral)   Resp 18   Ht '5\' 9"'$  (1.753 m)   Wt 69.1 kg   SpO2 96%   BMI 22.50 kg/m   Intake/Output Summary (Last 24 hours) at 12/29/2020 1256 Last data filed at 12/29/2020 1049 Gross per 24 hour  Intake 360 ml  Output 1802 ml  Net -1442 ml   Intake/Output: I/O last 3 completed shifts: In: 600 [P.O.:600] Out: 1450 [Urine:1450]  Intake/Output this shift:  Total I/O In: -  Out: 1002 [Other:1002] Weight change: 0 kg Gen: nAD CVS: RRR Resp: cta Abd: +BS, soft, NT/ND Ext: no edema, LUE AVG +T/B  Recent Labs  Lab 12/23/20 0203 12/24/20 0106 12/25/20 0147 12/26/20 0126 12/27/20 0321 12/29/20 0725  NA 134* 132* 134* 131* 135 135  K 4.0 3.8 4.2 4.1 4.0 3.9  CL 97* 94* 95* 95* 99 99  CO2 '25 25 25 23 25 24  '$ GLUCOSE 88 117* 111* 128* 112* 102*  BUN 54* 73* 45* 70* 37* 81*  CREATININE 4.31* 5.22* 4.32* 5.16* 3.30* 5.21*  ALBUMIN 2.6* 2.5* 2.6* 2.6* 2.7* 2.6*  CALCIUM 8.1* 8.5* 8.4* 8.4* 8.6* 8.8*  PHOS 5.8* 6.6* 4.6 4.7* 3.2 5.0*   Liver Function Tests: Recent Labs  Lab 12/26/20 0126 12/27/20 0321 12/29/20 0725  ALBUMIN 2.6* 2.7* 2.6*   No results for input(s): LIPASE, AMYLASE in the last 168 hours. No results for input(s): AMMONIA in the last 168 hours. CBC: Recent Labs  Lab 12/23/20 0203 12/24/20 0106 12/26/20 0126 12/29/20 0725  WBC 7.7 6.7 7.5 6.4  NEUTROABS 6.2 5.1  --   --   HGB 8.3* 7.8* 7.9* 8.1*  HCT 26.1* 25.8* 24.9* 26.9*  MCV 77.0* 77.9* 76.4* 79.4*  PLT 162 155 183 197   Cardiac Enzymes: No results for input(s): CKTOTAL, CKMB, CKMBINDEX, TROPONINI in the last 168 hours. CBG: Recent Labs  Lab 12/28/20 1208 12/28/20 1653 12/28/20 2011 12/29/20 0650 12/29/20 1218  GLUCAP 149* 144* 90 106* 104*    Iron Studies: No results for input(s): IRON,  TIBC, TRANSFERRIN, FERRITIN in the last 72 hours. Studies/Results: No results found. Marland Kitchen atorvastatin  40 mg Oral q1800  . Chlorhexidine Gluconate Cloth  6 each Topical Q0600  . citalopram  10 mg Oral Daily  . darbepoetin (ARANESP) injection - DIALYSIS  100 mcg Intravenous Q Tue-HD  . feeding supplement (NEPRO CARB STEADY)  237 mL Oral BID BM  . heparin sodium (porcine)      . hydrALAZINE  100 mg Oral Q8H  . influenza vac split quadrivalent PF  0.5 mL Intramuscular Tomorrow-1000  . insulin aspart  0-9 Units Subcutaneous TID WC  . isosorbide mononitrate  60 mg Oral Daily  . labetalol  200 mg Oral BID  . melatonin  5 mg Oral QHS  . multivitamin  1 tablet Oral QHS  . pantoprazole  40 mg Oral BID  . pneumococcal 23 valent vaccine  0.5 mL Intramuscular Tomorrow-1000  . polyethylene glycol  17 g Oral Daily  . sevelamer carbonate  800 mg Oral TID WC  . sodium chloride flush  3 mL Intravenous Q12H    BMET    Component Value Date/Time   NA 135 12/29/2020 0725   NA 136 08/06/2019 1400   K  3.9 12/29/2020 0725   CL 99 12/29/2020 0725   CO2 24 12/29/2020 0725   GLUCOSE 102 (H) 12/29/2020 0725   BUN 81 (H) 12/29/2020 0725   BUN 20 08/06/2019 1400   CREATININE 5.21 (H) 12/29/2020 0725   CREATININE 1.28 08/19/2015 0844   CALCIUM 8.8 (L) 12/29/2020 0725   GFRNONAA 12 (L) 12/29/2020 0725   GFRNONAA 76 12/01/2014 1037   GFRAA 33 (L) 08/06/2019 1400   GFRAA 88 12/01/2014 1037   CBC    Component Value Date/Time   WBC 6.4 12/29/2020 0725   RBC 3.39 (L) 12/29/2020 0725   HGB 8.1 (L) 12/29/2020 0725   HGB 13.4 09/04/2018 1211   HCT 26.9 (L) 12/29/2020 0725   HCT 41.1 09/04/2018 1211   PLT 197 12/29/2020 0725   PLT 190 09/04/2018 1211   MCV 79.4 (L) 12/29/2020 0725   MCV 71 (L) 09/04/2018 1211   MCH 23.9 (L) 12/29/2020 0725   MCHC 30.1 12/29/2020 0725   RDW 15.6 (H) 12/29/2020 0725   RDW 15.8 (H) 09/04/2018 1211   LYMPHSABS 0.8 12/24/2020 0106   MONOABS 0.6 12/24/2020 0106   EOSABS  0.1 12/24/2020 0106   BASOSABS 0.0 12/24/2020 0106     Assessment/Plan:  1.AKI on [id number] 2.6 in October 2021. Rapidly progressing kidney loss. Ultrasound with small kidneys. Possible renal artery stenosis based on kidney size. RIJ tunn catheter and LUE avG on 2/21 with vascular. First HD on 2/21 -Has been on HD per TTS schedule - Monitor for signs of recovery - seems guarded - Have started CLIP process as AKI - no spot yet and awaiting SNF placement.  2. Hypertension/volume-malignant hypertension on arrival with flash pulmonary edema intermittently. Blood pressure improved. Discontinued renal artery duplex as still not done and HTN controlled. Note have stopped minoxidil.   3. AnemiaCKD - s/p feraheme; aranesp 40 mcg once on 2/22and dose increased to 100 mcg for next dose 3/1 Tuesday. May need PRBC's soon.  4.Hypokalemia- resolved with supplementation  5.A. Fib-has been controlled here recently.could be causing some variable renal perfusion-cardiology has seen   6. Hyponatremia: mild Likely related to free water retention in setting of kidney disease.Improved with HD  7. Metabolic bone disease- intact PTH 169. on sevelamer. On renal diet. Improved phos.   8. Disposition  - awaiting outpatient HD unit and a SNF  [name], MD [facility] [phone]

## 2020-12-29 NOTE — Progress Notes (Signed)
Patient ID: Ruben Barousse., male   DOB: [birth year], 65 y.o.   MRN: [id number]  PROGRESS NOTE    Ruben Masse.  [id number] DOB: 12/29/1955 DOA: 12/14/2020 PCP: Horald Pollen, MD   Brief Narrative:  65 year old male with history of hypertension, chronic kidney disease stage IV, diabetes mellitus type 2, hyperlipidemia presented with worsening shortness of breath.  On presentation, patient was found to have pulmonary edema along with acute kidney injury on chronic kidney disease with creatinine of five compared to baseline of 2.6 along with elevated troponin and chest x-ray showing pulmonary edema.  He was started on intravenous Lasix.  Nephrology was also consulted.  Subsequently, he had coffee-ground emesis for which heparin and aspirin were stopped and he was started on Protonix.  GI recommended EGD but patient refused.  During the hospitalization, renal function did not improve and patient was started on hemodialysis on 12/21/2020.  PT has recommended SNF placement.  Patient is currently medically stable for discharge.  Assessment & Plan:   Acute kidney injury on chronic renal disease stage IV -Baseline creatinine 2.6.  Presented with creatinine of 5.  Treated with intravenous diuretics but kidney function did not improve.  Renal ultrasound showed small kidneys -Nephrology following.  Patient was started on hemodialysis on 12/21/2020 -He underwent tunneled dialysis catheter placement and new left upper arm AV graft on 12/21/2020 by vascular surgery.  Outpatient follow-up with vascular surgery -Dialysis as per nephrology schedule  Acute on chronic diastolic CHF Hypertensive emergency Elevated troponins: Most likely from demand ischemia -Patient initially required Cardene drip which was subsequently discontinued. -Blood pressure has improved and intermittently still on the higher side.  Continue isosorbide mononitrate, labetalol, minoxidil -Volume currently being managed by  dialysis by nephrology.  Cardiology has signed off. -Strict input and output.  Daily weights.  Fluid restriction. -Echo showed EF of 65 to 70%.  Continue statins  Acute hypoxic respiratory failure -Resolved.  Currently on room air.  Off steroids.  Continue incentive spirometry and as needed nebs  Possible upper GI bleeding -Patient had coffee-ground emesis during this hospitalization. -heparin and aspirin were stopped and he was started on Protonix.  GI recommended EGD but patient refused.   -Subsequently, hemoglobin has stabilized.  Continue twice a day oral Protonix.  Transfuse if hemoglobin is less than 7  Anxiety/depression -Continue Celexa  Hyponatremia -Now being managed by dialysis by nephrology. Improving.  Chronic microcytic anemia -Hemoglobin stable.  Monitor intermittently  Diabetes mellitus type 2 with hyperglycemia -Oral meds on hold.  Continue CBGs with SSI  Severe malnutrition -Follow nutrition recommendation  Generalized conditioning -PT recommends SNF placement.  Social worker following.  DVT prophylaxis: SCDs Code Status: Full Family Communication: None Disposition Plan: Status is: Inpatient  Remains inpatient appropriate because:Hemodynamically unstable and Inpatient level of care appropriate due to severity of illness.     Dispo: The patient is from: Home              Anticipated d/c is to: SNF              Anticipated d/c date is: 1 day              Patient currently is medically stable to d/c.   Difficult to place patient No   Consultants: Nephrology/vascular surgery/cardiology/GI  Procedures:  tunneled dialysis catheter placement and new left upper arm AV graft on 12/21/2020 by vascular surgery Antimicrobials:  Anti-infectives (From admission, onward)   Start     Dose/Rate Route Frequency  Ordered Stop   12/18/20 0000  ceFAZolin (ANCEF) IVPB 1 g/50 mL premix  Status:  Discontinued       Note to Pharmacy: Send with pt to OR   1 g 100 mL/hr  over 30 Minutes Intravenous On call 12/17/20 1511 12/17/20 1513   12/18/20 0000  ceFAZolin (ANCEF) IVPB 2g/100 mL premix       Note to Pharmacy: Send with pt to OR   2 g 200 mL/hr over 30 Minutes Intravenous On call 12/17/20 1513 12/19/20 0000       Subjective: Patient seen and examined at bedside undergoing dialysis.  Patient denies any new complaints.  No overnight fever, chest pain, abdominal pain or vomiting reported. Objective: Vitals:   12/29/20 0717 12/29/20 0719 12/29/20 0721 12/29/20 0731  BP:   (!) 147/63 (!) 141/65  Pulse:      Resp:  (!) 33 (!) 28 (!) 22  Temp: 98.6 F (37 C)     TempSrc: Oral     SpO2: 96%     Weight: 70.1 kg     Height:        Intake/Output Summary (Last 24 hours) at 12/29/2020 0804 Last data filed at 12/29/2020 0600 Gross per 24 hour  Intake 360 ml  Output 800 ml  Net -440 ml   Filed Weights   12/28/20 0434 12/29/20 0449 12/29/20 0717  Weight: 71.7 kg 71.7 kg 70.1 kg    Examination:  General exam: No distress.  Currently on room air.  Looking chronically.  Poor historian Respiratory system: Decreased breath sounds at bases bilaterally  cardiovascular system: Rate controlled, S1-S2 heard  gastrointestinal system: Abdomen is slightly distended, soft and nontender.  Bowel sounds are heard  extremities: Bilateral lower extremity edema present; no clubbing  Data Reviewed: I have personally reviewed following labs and imaging studies  CBC: Recent Labs  Lab 12/22/20 0944 12/22/20 1740 12/23/20 0203 12/24/20 0106 12/26/20 0126  WBC  --   --  7.7 6.7 7.5  NEUTROABS  --   --  6.2 5.1  --   HGB 8.8* 8.6* 8.3* 7.8* 7.9*  HCT 28.7* 28.2* 26.1* 25.8* 24.9*  MCV  --   --  77.0* 77.9* 76.4*  PLT  --   --  162 155 [id number]   Basic Metabolic Panel: Recent Labs  Lab 12/23/20 0203 12/24/20 0106 12/25/20 0147 12/26/20 0126 12/27/20 0321  NA 134* 132* 134* 131* 135  K 4.0 3.8 4.2 4.1 4.0  CL 97* 94* 95* 95* 99  CO2 '25 25 25 23 25  '$ GLUCOSE 88  117* 111* 128* 112*  BUN 54* 73* 45* 70* 37*  CREATININE 4.31* 5.22* 4.32* 5.16* 3.30*  CALCIUM 8.1* 8.5* 8.4* 8.4* 8.6*  PHOS 5.8* 6.6* 4.6 4.7* 3.2   GFR: Estimated Creatinine Clearance: 22.1 mL/min (A) (by C-G formula based on SCr of 3.3 mg/dL (H)). Liver Function Tests: Recent Labs  Lab 12/23/20 0203 12/24/20 0106 12/25/20 0147 12/26/20 0126 12/27/20 0321  ALBUMIN 2.6* 2.5* 2.6* 2.6* 2.7*   No results for input(s): LIPASE, AMYLASE in the last 168 hours. No results for input(s): AMMONIA in the last 168 hours. Coagulation Profile: No results for input(s): INR, PROTIME in the last 168 hours. Cardiac Enzymes: No results for input(s): CKTOTAL, CKMB, CKMBINDEX, TROPONINI in the last 168 hours. BNP (last 3 results) No results for input(s): PROBNP in the last 8760 hours. HbA1C: No results for input(s): HGBA1C in the last 72 hours. CBG: Recent Labs  Lab 12/28/20 [id number]  12/28/20 1208 12/28/20 1653 12/28/20 2011 12/29/20 0650  GLUCAP 106* 149* 144* 90 106*   Lipid Profile: No results for input(s): CHOL, HDL, LDLCALC, TRIG, CHOLHDL, LDLDIRECT in the last 72 hours. Thyroid Function Tests: No results for input(s): TSH, T4TOTAL, FREET4, T3FREE, THYROIDAB in the last 72 hours. Anemia Panel: No results for input(s): VITAMINB12, FOLATE, FERRITIN, TIBC, IRON, RETICCTPCT in the last 72 hours. Sepsis Labs: No results for input(s): PROCALCITON, LATICACIDVEN in the last 168 hours.  No results found for this or any previous visit (from the past 240 hour(s)).       Radiology Studies: No results found.      Scheduled Meds: . atorvastatin  40 mg Oral q1800  . Chlorhexidine Gluconate Cloth  6 each Topical Q0600  . citalopram  10 mg Oral Daily  . darbepoetin (ARANESP) injection - DIALYSIS  100 mcg Intravenous Q Tue-HD  . feeding supplement (NEPRO CARB STEADY)  237 mL Oral BID BM  . hydrALAZINE  100 mg Oral Q8H  . influenza vac split quadrivalent PF  0.5 mL Intramuscular  Tomorrow-1000  . insulin aspart  0-9 Units Subcutaneous TID WC  . isosorbide mononitrate  60 mg Oral Daily  . labetalol  200 mg Oral BID  . melatonin  5 mg Oral QHS  . multivitamin  1 tablet Oral QHS  . pantoprazole  40 mg Oral BID  . pneumococcal 23 valent vaccine  0.5 mL Intramuscular Tomorrow-1000  . polyethylene glycol  17 g Oral Daily  . sevelamer carbonate  800 mg Oral TID WC  . sodium chloride flush  3 mL Intravenous Q12H   Continuous Infusions: . sodium chloride            Aline August, MD [facility] 12/29/2020, 8:04 AM

## 2020-12-29 NOTE — Progress Notes (Signed)
OT Cancellation Note  Patient Details Name: Ruben Camacho. MRN: [id number] DOB: 1956/05/01   Cancelled Treatment:    Reason Eval/Treat Not Completed: Patient declined, no reason specified (Pt had recently returned from dialysis and was sleeping and did not want to be interrupted. OT to continue to follow for OT intervention.)  Jefferey Pica, OTR/L Acute Rehabilitation Services Pager: [phone] Office: [phone]  Ruben Camacho  C 12/29/2020, 5:15 PM

## 2020-12-30 ENCOUNTER — Inpatient Hospital Stay (HOSPITAL_COMMUNITY): Payer: 59

## 2020-12-30 DIAGNOSIS — N179 Acute kidney failure, unspecified: Secondary | ICD-10-CM | POA: Diagnosis not present

## 2020-12-30 DIAGNOSIS — E43 Unspecified severe protein-calorie malnutrition: Secondary | ICD-10-CM | POA: Diagnosis not present

## 2020-12-30 DIAGNOSIS — I10 Essential (primary) hypertension: Secondary | ICD-10-CM

## 2020-12-30 DIAGNOSIS — N186 End stage renal disease: Secondary | ICD-10-CM | POA: Diagnosis not present

## 2020-12-30 LAB — CBC WITH DIFFERENTIAL/PLATELET
Abs Immature Granulocytes: 0.06 10*3/uL (ref 0.00–0.07)
Basophils Absolute: 0 10*3/uL (ref 0.0–0.1)
Basophils Relative: 1 %
Eosinophils Absolute: 0.1 10*3/uL (ref 0.0–0.5)
Eosinophils Relative: 1 %
HCT: 28.4 % — ABNORMAL LOW (ref 39.0–52.0)
Hemoglobin: 8.6 g/dL — ABNORMAL LOW (ref 13.0–17.0)
Immature Granulocytes: 1 %
Lymphocytes Relative: 20 %
Lymphs Abs: 1.3 10*3/uL (ref 0.7–4.0)
MCH: 23.7 pg — ABNORMAL LOW (ref 26.0–34.0)
MCHC: 30.3 g/dL (ref 30.0–36.0)
MCV: 78.2 fL — ABNORMAL LOW (ref 80.0–100.0)
Monocytes Absolute: 0.8 10*3/uL (ref 0.1–1.0)
Monocytes Relative: 12 %
Neutro Abs: 4.2 10*3/uL (ref 1.7–7.7)
Neutrophils Relative %: 65 %
Platelets: 187 10*3/uL (ref 150–400)
RBC: 3.63 MIL/uL — ABNORMAL LOW (ref 4.22–5.81)
RDW: 15.3 % (ref 11.5–15.5)
WBC: 6.4 10*3/uL (ref 4.0–10.5)
nRBC: 0 % (ref 0.0–0.2)

## 2020-12-30 LAB — BASIC METABOLIC PANEL
Anion gap: 14 (ref 5–15)
BUN: 37 mg/dL — ABNORMAL HIGH (ref 8–23)
CO2: 24 mmol/L (ref 22–32)
Calcium: 8.7 mg/dL — ABNORMAL LOW (ref 8.9–10.3)
Chloride: 98 mmol/L (ref 98–111)
Creatinine, Ser: 3.4 mg/dL — ABNORMAL HIGH (ref 0.61–1.24)
GFR, Estimated: 19 mL/min — ABNORMAL LOW (ref >60)
Glucose, Bld: 116 mg/dL — ABNORMAL HIGH (ref 70–99)
Potassium: 3.8 mmol/L (ref 3.5–5.1)
Sodium: 136 mmol/L (ref 135–145)

## 2020-12-30 LAB — GLUCOSE, CAPILLARY
Glucose-Capillary: 137 mg/dL — ABNORMAL HIGH (ref 70–99)
Glucose-Capillary: 128 mg/dL — ABNORMAL HIGH (ref 70–99)
Glucose-Capillary: 114 mg/dL — ABNORMAL HIGH (ref 70–99)
Glucose-Capillary: 149 mg/dL — ABNORMAL HIGH (ref 70–99)

## 2020-12-30 NOTE — Progress Notes (Signed)
PROGRESS NOTE        PATIENT DETAILS Name: Ruben Camacho. Age: 65 y.o. Sex: male Date of Birth: 01-29-1956 Admit Date: 12/14/2020 Admitting Physician Lequita Halt, MD [id number], Ines Bloomer, MD  Brief Narrative: Patient is a 65 y.o. male with a history of CKD stage IV, DM-2, HLD presented with shortness of breath-he was found to have pulmonary edema in the setting of worsening renal function.  Hospital course complicated by possible upper GI bleeding and blood loss anemia.  Significant events: 2/14>> admit for worsening SOB-pulmonary edema in the setting of AKI on CKD stage IV. 2/18>> GI consult for coffee-ground emesis-patient refused EGD. 2/21>> started on HD  Significant studies: 2/14>> Echo: EF 40%, global hypokinesis, grade 2 diastolic dysfunction, RVSP 26.6 2/14>> chest x-ray: Bilateral airspace disease 2/15>> chest x-ray: Bilateral pulmonary infiltrate/edema 2/15>> renal ultrasound: Chronic medical renal disease-no hydronephrosis 2/20>> Echo: EF 65-70%, severe LVH, grade 1 diastolic dysfunction  Antimicrobial therapy: None  Microbiology data: None  Procedures : 2/21>> TDC and new left upper arm AVG  Consults: Nephrology Vascular surgery GI Cardiology  DVT Prophylaxis : SCD's given UGI   Subjective: No shortness of breath-no chest pain-no abdominal pain.  Lying comfortably in bed.  Assessment/Plan: AKI on CKD stage IV: AKI likely hemodynamically mediated-on HD per discussion of nephrology.  Continue to monitor for signs of renal recovery.   Acute on chronic diastolic heart failure in the setting of worsening renal function: Volume status stable-diuresis with HD.  Acute hypoxic respiratory failure: Due to pulmonary edema-resolved  Upper GI bleeding: Resolved-GI consulted-patient refused EGD.  Hemoglobin stable.  Continue PPI twice daily.  Chronic microcytic anemia: Continue to follow closely-we will need outpatient  endoscopic evaluation.  Hyponatremia: Resolved  Anxiety/depression: Stable continue Celexa  DM-2 (A1c 7.0 on 2/15): Continue SSI  Recent Labs    12/29/20 1741 12/29/20 2050 12/30/20 0724  GLUCAP 152* 113* 137*   Deconditioning/debility: PT recommending SNF.  Nutrition Problem: Nutrition Problem: Severe Malnutrition Etiology: acute illness (SOB r/t CHF causing poor appetite) Signs/Symptoms: mild fat depletion,mild muscle depletion,moderate muscle depletion,moderate fat depletion Interventions: Ensure Enlive (each supplement provides 350kcal and 20 grams of protein)   Diet: Diet Order            Diet renal/carb modified with fluid restriction Fluid restriction: 1200 mL Fluid; Room service appropriate? Yes; Fluid consistency: Thin  Diet effective now                  Code Status: Full code  Family Communication: We will update family over the next few days  Disposition Plan: Status is: Inpatient  Remains inpatient appropriate because:Inpatient level of care appropriate due to severity of illness   Dispo: The patient is from: Home              Anticipated d/c is to: SNF              Patient currently is not medically stable to d/c.   Difficult to place patient No   Barriers to Discharge: AKI-awaiting clipping  Antimicrobial agents: Anti-infectives (From admission, onward)   Start     Dose/Rate Route Frequency Ordered Stop   12/18/20 0000  ceFAZolin (ANCEF) IVPB 1 g/50 mL premix  Status:  Discontinued       Note to Pharmacy: Send with pt to OR   1 g 100  mL/hr over 30 Minutes Intravenous On call 12/17/20 1511 12/17/20 1513   12/18/20 0000  ceFAZolin (ANCEF) IVPB 2g/100 mL premix       Note to Pharmacy: Send with pt to OR   2 g 200 mL/hr over 30 Minutes Intravenous On call 12/17/20 1513 12/19/20 0000       Time spent: 25- minutes-Greater than 50% of this time was spent in counseling, explanation of diagnosis, planning of further management, and  coordination of care.  MEDICATIONS: Scheduled Meds: . atorvastatin  40 mg Oral q1800  . Chlorhexidine Gluconate Cloth  6 each Topical Q0600  . citalopram  10 mg Oral Daily  . darbepoetin (ARANESP) injection - DIALYSIS  100 mcg Intravenous Q Tue-HD  . feeding supplement (NEPRO CARB STEADY)  237 mL Oral BID BM  . hydrALAZINE  100 mg Oral Q8H  . influenza vac split quadrivalent PF  0.5 mL Intramuscular Tomorrow-1000  . insulin aspart  0-9 Units Subcutaneous TID WC  . isosorbide mononitrate  60 mg Oral Daily  . labetalol  200 mg Oral BID  . lactulose  20 g Oral BID  . melatonin  5 mg Oral QHS  . multivitamin  1 tablet Oral QHS  . pantoprazole  40 mg Oral BID  . pneumococcal 23 valent vaccine  0.5 mL Intramuscular Tomorrow-1000  . polyethylene glycol  17 g Oral BID  . sevelamer carbonate  800 mg Oral TID WC  . sodium chloride flush  3 mL Intravenous Q12H   Continuous Infusions: . sodium chloride     PRN Meds:.sodium chloride, acetaminophen, benzonatate, bisacodyl, guaiFENesin-dextromethorphan, heparin sodium (porcine), levalbuterol, LORazepam, ondansetron (ZOFRAN) IV, oxyCODONE-acetaminophen, sodium chloride flush   PHYSICAL EXAM: Vital signs: Vitals:   12/29/20 2051 12/29/20 2256 12/30/20 0500 12/30/20 0512  BP: 128/70 130/72  (!) 152/75  Pulse: 72   78  Resp: 16   16  Temp: 98.7 F (37.1 C)   98.6 F (37 C)  TempSrc: Axillary   Oral  SpO2:    97%  Weight:   70.5 kg   Height:       Filed Weights   12/29/20 0717 12/29/20 1049 12/30/20 0500  Weight: 70.1 kg 69.1 kg 70.5 kg   Body mass index is 22.95 kg/m.   Gen Exam:Alert awake-not in any distress HEENT:atraumatic, normocephalic Chest: B/L clear to auscultation anteriorly CVS:S1S2 regular Abdomen:soft non tender, non distended Extremities:no edema Neurology: Non focal Skin: no rash  I have personally reviewed following labs and imaging studies  LABORATORY DATA: CBC: Recent Labs  Lab 12/24/20 0106  12/26/20 0126 12/29/20 0725 12/30/20 0136  WBC 6.7 7.5 6.4 6.4  NEUTROABS 5.1  --   --  4.2  HGB 7.8* 7.9* 8.1* 8.6*  HCT 25.8* 24.9* 26.9* 28.4*  MCV 77.9* 76.4* 79.4* 78.2*  PLT 155 183 197 [id number]    Basic Metabolic Panel: Recent Labs  Lab 12/24/20 0106 12/25/20 0147 12/26/20 0126 12/27/20 0321 12/29/20 0725 12/30/20 0136  NA 132* 134* 131* 135 135 136  K 3.8 4.2 4.1 4.0 3.9 3.8  CL 94* 95* 95* 99 99 98  CO2 '25 25 23 25 24 24  '$ GLUCOSE 117* 111* 128* 112* 102* 116*  BUN 73* 45* 70* 37* 81* 37*  CREATININE 5.22* 4.32* 5.16* 3.30* 5.21* 3.40*  CALCIUM 8.5* 8.4* 8.4* 8.6* 8.8* 8.7*  PHOS 6.6* 4.6 4.7* 3.2 5.0*  --     GFR: Estimated Creatinine Clearance: 21.6 mL/min (A) (by C-G formula based on SCr of 3.4  mg/dL (H)).  Liver Function Tests: Recent Labs  Lab 12/24/20 0106 12/25/20 0147 12/26/20 0126 12/27/20 0321 12/29/20 0725  ALBUMIN 2.5* 2.6* 2.6* 2.7* 2.6*   No results for input(s): LIPASE, AMYLASE in the last 168 hours. No results for input(s): AMMONIA in the last 168 hours.  Coagulation Profile: No results for input(s): INR, PROTIME in the last 168 hours.  Cardiac Enzymes: No results for input(s): CKTOTAL, CKMB, CKMBINDEX, TROPONINI in the last 168 hours.  BNP (last 3 results) No results for input(s): PROBNP in the last 8760 hours.  Lipid Profile: No results for input(s): CHOL, HDL, LDLCALC, TRIG, CHOLHDL, LDLDIRECT in the last 72 hours.  Thyroid Function Tests: No results for input(s): TSH, T4TOTAL, FREET4, T3FREE, THYROIDAB in the last 72 hours.  Anemia Panel: No results for input(s): VITAMINB12, FOLATE, FERRITIN, TIBC, IRON, RETICCTPCT in the last 72 hours.  Urine analysis:    Component Value Date/Time   COLORURINE STRAW (A) 12/15/2020 2334   APPEARANCEUR CLEAR 12/15/2020 2334   LABSPEC 1.009 12/15/2020 2334   PHURINE 6.0 12/15/2020 2334   GLUCOSEU 50 (A) 12/15/2020 2334   HGBUR NEGATIVE 12/15/2020 2334   BILIRUBINUR NEGATIVE 12/15/2020  2334   BILIRUBINUR negative 09/04/2018 1030   KETONESUR NEGATIVE 12/15/2020 2334   PROTEINUR 100 (A) 12/15/2020 2334   UROBILINOGEN 0.2 09/04/2018 1030   UROBILINOGEN 1.0 12/08/2014 1035   NITRITE NEGATIVE 12/15/2020 2334   LEUKOCYTESUR NEGATIVE 12/15/2020 2334    Sepsis Labs: Lactic Acid, Venous    Component Value Date/Time   LATICACIDVEN 1.0 12/18/2020 0003    MICROBIOLOGY: No results found for this or any previous visit (from the past 240 hour(s)).  RADIOLOGY STUDIES/RESULTS: DG Abd 1 View  Result Date: 12/30/2020 CLINICAL DATA:  Constipation, rectal pain. EXAM: ABDOMEN - 1 VIEW COMPARISON:  December 18, 2020. FINDINGS: The bowel gas pattern is normal. Mild amount of stool is seen throughout the colon and rectum. No radio-opaque calculi or other significant radiographic abnormality are seen. IMPRESSION: Mild stool burden.  No evidence of bowel obstruction or ileus. Electronically Signed   By: Marijo Conception M.D.   On: 12/30/2020 08:16     LOS: 16 days   Oren Binet, MD  [facility]    To contact the attending provider between 7A-7P or the covering provider during after hours 7P-7A, please log into the web site [email or web address] and access using universal [place] password for that web site. If you do not have the password, please call the hospital operator.  12/30/2020, 12:55 PM

## 2020-12-30 NOTE — Progress Notes (Signed)
Patient ID: Ruben Papendick., male   DOB: 11-Sep-1956, 65 y.o.   MRN: [id number] S: Complaining of constipation and rectal pain this morning O:BP (!) 152/75 (BP Location: Right Arm)   Pulse 78   Temp 98.6 F (37 C) (Oral)   Resp 16   Ht '5\' 9"'$  (1.753 m)   Wt 70.5 kg   SpO2 97%   BMI 22.95 kg/m   Intake/Output Summary (Last 24 hours) at 12/30/2020 1318 Last data filed at 12/29/2020 1737 Gross per 24 hour  Intake 120 ml  Output 150 ml  Net -30 ml   Intake/Output: I/O last 3 completed shifts: In: 360 [P.O.:360] Out: 1502 [Urine:500; Other:1002]  Intake/Output this shift:  No intake/output data recorded. Weight change: -1.6 kg Gen: NAD CVS: RRR Resp: cta Abd: +BS, soft, NT/ND Ext: no edema, LUE AVG +T/B  Recent Labs  Lab 12/24/20 0106 12/25/20 0147 12/26/20 0126 12/27/20 0321 12/29/20 0725 12/30/20 0136  NA 132* 134* 131* 135 135 136  K 3.8 4.2 4.1 4.0 3.9 3.8  CL 94* 95* 95* 99 99 98  CO2 '25 25 23 25 24 24  '$ GLUCOSE 117* 111* 128* 112* 102* 116*  BUN 73* 45* 70* 37* 81* 37*  CREATININE 5.22* 4.32* 5.16* 3.30* 5.21* 3.40*  ALBUMIN 2.5* 2.6* 2.6* 2.7* 2.6*  --   CALCIUM 8.5* 8.4* 8.4* 8.6* 8.8* 8.7*  PHOS 6.6* 4.6 4.7* 3.2 5.0*  --    Liver Function Tests: Recent Labs  Lab 12/26/20 0126 12/27/20 0321 12/29/20 0725  ALBUMIN 2.6* 2.7* 2.6*   No results for input(s): LIPASE, AMYLASE in the last 168 hours. No results for input(s): AMMONIA in the last 168 hours. CBC: Recent Labs  Lab 12/24/20 0106 12/26/20 0126 12/29/20 0725 12/30/20 0136  WBC 6.7 7.5 6.4 6.4  NEUTROABS 5.1  --   --  4.2  HGB 7.8* 7.9* 8.1* 8.6*  HCT 25.8* 24.9* 26.9* 28.4*  MCV 77.9* 76.4* 79.4* 78.2*  PLT 155 183 197 187   Cardiac Enzymes: No results for input(s): CKTOTAL, CKMB, CKMBINDEX, TROPONINI in the last 168 hours. CBG: Recent Labs  Lab 12/29/20 0650 12/29/20 1218 12/29/20 1741 12/29/20 2050 12/30/20 0724  GLUCAP 106* 104* 152* 113* 137*    Iron Studies: No results for  input(s): IRON, TIBC, TRANSFERRIN, FERRITIN in the last 72 hours. Studies/Results: DG Abd 1 View  Result Date: 12/30/2020 CLINICAL DATA:  Constipation, rectal pain. EXAM: ABDOMEN - 1 VIEW COMPARISON:  December 18, 2020. FINDINGS: The bowel gas pattern is normal. Mild amount of stool is seen throughout the colon and rectum. No radio-opaque calculi or other significant radiographic abnormality are seen. IMPRESSION: Mild stool burden.  No evidence of bowel obstruction or ileus. Electronically Signed   By: Marijo Conception M.D.   On: 12/30/2020 08:16   . atorvastatin  40 mg Oral q1800  . Chlorhexidine Gluconate Cloth  6 each Topical Q0600  . citalopram  10 mg Oral Daily  . darbepoetin (ARANESP) injection - DIALYSIS  100 mcg Intravenous Q Tue-HD  . feeding supplement (NEPRO CARB STEADY)  237 mL Oral BID BM  . hydrALAZINE  100 mg Oral Q8H  . influenza vac split quadrivalent PF  0.5 mL Intramuscular Tomorrow-1000  . insulin aspart  0-9 Units Subcutaneous TID WC  . isosorbide mononitrate  60 mg Oral Daily  . labetalol  200 mg Oral BID  . lactulose  20 g Oral BID  . melatonin  5 mg Oral QHS  . multivitamin  1 tablet Oral QHS  . pantoprazole  40 mg Oral BID  . pneumococcal 23 valent vaccine  0.5 mL Intramuscular Tomorrow-1000  . polyethylene glycol  17 g Oral BID  . sevelamer carbonate  800 mg Oral TID WC  . sodium chloride flush  3 mL Intravenous Q12H    BMET    Component Value Date/Time   NA 136 12/30/2020 0136   NA 136 08/06/2019 1400   K 3.8 12/30/2020 0136   CL 98 12/30/2020 0136   CO2 24 12/30/2020 0136   GLUCOSE 116 (H) 12/30/2020 0136   BUN 37 (H) 12/30/2020 0136   BUN 20 08/06/2019 1400   CREATININE 3.40 (H) 12/30/2020 0136   CREATININE 1.28 08/19/2015 0844   CALCIUM 8.7 (L) 12/30/2020 0136   GFRNONAA 19 (L) 12/30/2020 0136   GFRNONAA 76 12/01/2014 1037   GFRAA 33 (L) 08/06/2019 1400   GFRAA 88 12/01/2014 1037   CBC    Component Value Date/Time   WBC 6.4 12/30/2020 0136    RBC 3.63 (L) 12/30/2020 0136   HGB 8.6 (L) 12/30/2020 0136   HGB 13.4 09/04/2018 1211   HCT 28.4 (L) 12/30/2020 0136   HCT 41.1 09/04/2018 1211   PLT 187 12/30/2020 0136   PLT 190 09/04/2018 1211   MCV 78.2 (L) 12/30/2020 0136   MCV 71 (L) 09/04/2018 1211   MCH 23.7 (L) 12/30/2020 0136   MCHC 30.3 12/30/2020 0136   RDW 15.3 12/30/2020 0136   RDW 15.8 (H) 09/04/2018 1211   LYMPHSABS 1.3 12/30/2020 0136   MONOABS 0.8 12/30/2020 0136   EOSABS 0.1 12/30/2020 0136   BASOSABS 0.0 12/30/2020 0136     Assessment/Plan:  1.AKI on [id number] 2.6 in October 2021. Rapidly progressing kidney loss. Ultrasound with small kidneys. Possible renal artery stenosis based on kidney size. RIJ tunn catheter and LUE avGon 2/21 with vascular. First HD on 2/21 -Has been on HD per TTS schedule - Monitor for signs of recovery - seems guarded - Have started CLIP process as AKI - no spot yetand awaiting SNF placement.  2. Hypertension/volume-malignant hypertension on arrival with flash pulmonary edema intermittently. Blood pressure improved. Discontinued renal artery duplex as still not done and HTN controlled. Note have stopped minoxidil.   3. AnemiaCKD - s/p feraheme; aranesp 40 mcg once on 2/22and dose increased to 100 mcg for next dose 3/1 Tuesday. May need PRBC's soon.  4.Hypokalemia- resolved with supplementation  5.A. Fib-has been controlled here recently.could be causing some variable renal perfusion-cardiology has seen   6. Hyponatremia: mild Likely related to free water retention in setting of kidney disease.Improved with HD  7. Metabolic bone disease- intact PTH 169. on sevelamer. On renal diet. Improved phos.   8. Disposition- awaiting word from SNF [facility]) and has been accepted at [facility] and awaiting outpatient schedule  [name], MD [employer] [phone]

## 2020-12-30 NOTE — Plan of Care (Signed)
  Problem: Education: Goal: Knowledge of General Education information will improve Description: Including pain rating scale, medication(s)/side effects and non-pharmacologic comfort measures Outcome: Progressing   Problem: Health Behavior/Discharge Planning: Goal: Ability to manage health-related needs will improve Outcome: Progressing   Problem: Clinical Measurements: Goal: Ability to maintain clinical measurements within normal limits will improve Outcome: Progressing Goal: Will remain free from infection Outcome: Progressing Goal: Diagnostic test results will improve Outcome: Progressing Goal: Respiratory complications will improve Outcome: Progressing Goal: Cardiovascular complication will be avoided Outcome: Progressing   Problem: Activity: Goal: Risk for activity intolerance will decrease Outcome: Progressing   Problem: Nutrition: Goal: Adequate nutrition will be maintained Outcome: Progressing   Problem: Coping: Goal: Level of anxiety will decrease Outcome: Progressing   Problem: Elimination: Goal: Will not experience complications related to bowel motility Outcome: Progressing Goal: Will not experience complications related to urinary retention Outcome: Progressing   Problem: Pain Managment: Goal: General experience of comfort will improve Outcome: Progressing   Problem: Safety: Goal: Ability to remain free from injury will improve Outcome: Progressing   Problem: Skin Integrity: Goal: Risk for impaired skin integrity will decrease Outcome: Progressing   Problem: Education: Goal: Knowledge of disease and its progression will improve Outcome: Progressing   Problem: Health Behavior/Discharge Planning: Goal: Ability to manage health-related needs will improve Outcome: Progressing   Problem: Clinical Measurements: Goal: Complications related to the disease process or treatment will be avoided or minimized Outcome: Progressing Goal: Dialysis access  will remain free of complications Outcome: Progressing   Problem: Activity: Goal: Activity intolerance will improve Outcome: Progressing   Problem: Fluid Volume: Goal: Fluid volume balance will be maintained or improved Outcome: Progressing   Problem: Nutritional: Goal: Ability to make appropriate dietary choices will improve Outcome: Progressing   Problem: Respiratory: Goal: Respiratory symptoms related to disease process will be avoided Outcome: Progressing   Problem: Self-Concept: Goal: Body image disturbance will be avoided or minimized Outcome: Progressing   Problem: Urinary Elimination: Goal: Progression of disease will be identified and treated Outcome: Progressing   Problem: Education: Goal: Ability to demonstrate management of disease process will improve Outcome: Progressing Goal: Ability to verbalize understanding of medication therapies will improve Outcome: Progressing Goal: Individualized Educational Video(s) Outcome: Progressing   Problem: Activity: Goal: Capacity to carry out activities will improve Outcome: Progressing   Problem: Cardiac: Goal: Ability to achieve and maintain adequate cardiopulmonary perfusion will improve Outcome: Progressing

## 2020-12-30 NOTE — Progress Notes (Incomplete)
Pharmacy Rounding Learning Note - not an active part of the chart  CC/HPI: Pt showed up to the ED on 2/14 with increased SOB.   PMH: CKD stage 3, CHF, HTN, T2DM, HLD  Plan:  - dialysis today  - waiting to hear from SNF [facility]) and pt has been accepted at [facility], waiting for schedule   Anticoag:  ID:  CV: hypertensive emergency upon admission (BP 255/156); currently on hydralazine 100 mg Q8H, labetalol 200 mg BID, isosorbide mononitrate 60 mg daily; BP 124/65 to 157/71 from 3/2 to today  Endo: BG remain below 180 since 12/27/20; currently on Novolog TID with meals GI/Nutrition:  Neuro:  Renal:  Cause of AKI: possible renal artery stenosis   SCr: baseline 2.6 (07/2020), 3.40 (12/30/20), down from 3/1 value of 5.21 (after dialysis)  Dialysis: currently on hemodialysis TTS; blood flow rate of 400 mL/min; last dialysis was on 12/29/20 from 7:15 to 10:45   Urine output: 300 mL (12/30/20), *** recorded today  Electrolytes:  Sodium: 136  Potassium: 3.8  Calcium: 8.7 (low)   Phosphorus: 5.0 (high)  Currently on sevelamer carbonate TID with  meals  Pulm: Pulmonary edema resolved  Heme/Onc:  Hgb: 8.6 (12/30/20) - currently on Aranesp (darbepoetin) inpatient every Tuesday  PTA Med Issues:  TOC Issues:  Best Practices:  Panic Labs:  HRM:  IV-PO:
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
## 2020-12-31 DIAGNOSIS — N179 Acute kidney failure, unspecified: Secondary | ICD-10-CM | POA: Diagnosis not present

## 2020-12-31 DIAGNOSIS — I10 Essential (primary) hypertension: Secondary | ICD-10-CM | POA: Diagnosis not present

## 2020-12-31 LAB — RENAL FUNCTION PANEL
Albumin: 2.7 g/dL — ABNORMAL LOW (ref 3.5–5.0)
Anion gap: 15 (ref 5–15)
BUN: 56 mg/dL — ABNORMAL HIGH (ref 8–23)
CO2: 23 mmol/L (ref 22–32)
Calcium: 8.8 mg/dL — ABNORMAL LOW (ref 8.9–10.3)
Chloride: 97 mmol/L — ABNORMAL LOW (ref 98–111)
Creatinine, Ser: 5.27 mg/dL — ABNORMAL HIGH (ref 0.61–1.24)
GFR, Estimated: 11 mL/min — ABNORMAL LOW (ref >60)
Glucose, Bld: 99 mg/dL (ref 70–99)
Phosphorus: 4.8 mg/dL — ABNORMAL HIGH (ref 2.5–4.6)
Potassium: 3.4 mmol/L — ABNORMAL LOW (ref 3.5–5.1)
Sodium: 135 mmol/L (ref 135–145)

## 2020-12-31 LAB — GLUCOSE, CAPILLARY
Glucose-Capillary: 96 mg/dL (ref 70–99)
Glucose-Capillary: 95 mg/dL (ref 70–99)
Glucose-Capillary: 123 mg/dL — ABNORMAL HIGH (ref 70–99)
Glucose-Capillary: 98 mg/dL (ref 70–99)

## 2020-12-31 MED ORDER — HEPARIN SODIUM (PORCINE) 1000 UNIT/ML IJ SOLN
INTRAMUSCULAR | Status: AC
  Administered 2020-12-31: 1000 [IU] via INTRAVENOUS
  Filled 2020-12-31: qty 4

## 2020-12-31 MED ORDER — SENNA 8.6 MG PO TABS
2.0000 | ORAL_TABLET | Freq: Every day | ORAL | Status: DC
  Administered 2020-12-31 – 2021-01-08 (×4): 17.2 mg via ORAL
  Filled 2020-12-31 (×7): qty 2

## 2020-12-31 NOTE — Progress Notes (Signed)
PT Cancellation Note  Patient Details Name: Ruben Camacho. MRN: [id number] DOB: [birth year]   Cancelled Treatment:    Reason Eval/Treat Not Completed: Patient at procedure or test/unavailable (HD). Will follow-up for PT treatment post-HD as schedule permits.  Mabeline Caras, PT, DPT Acute Rehabilitation Services  Pager [phone] Office [place] 12/31/2020, 8:17 AM

## 2020-12-31 NOTE — Procedures (Signed)
I was present at this dialysis session. I have reviewed the session itself and made appropriate changes.   Vital signs in last 24 hours:  Temp:  [97.9 F (36.6 C)-98.8 F (37.1 C)] 98.3 F (36.8 C) (03/03 0805) Pulse Rate:  [70-83] 75 (03/03 0805) Resp:  [18-20] 18 (03/03 0805) BP: (124-157)/(58-71) 142/71 (03/03 0805) SpO2:  [94 %-98 %] 96 % (03/03 0805) Weight:  [67.4 kg-67.5 kg] 67.4 kg (03/03 0805) Weight change: -2.6 kg Filed Weights   12/30/20 0500 12/31/20 0548 12/31/20 0805  Weight: 70.5 kg 67.5 kg 67.4 kg    Recent Labs  Lab 12/31/20 0711  NA 135  K 3.4*  CL 97*  CO2 23  GLUCOSE 99  BUN 56*  CREATININE 5.27*  CALCIUM 8.8*  PHOS 4.8*    Recent Labs  Lab 12/26/20 0126 12/29/20 0725 12/30/20 0136  WBC 7.5 6.4 6.4  NEUTROABS  --   --  4.2  HGB 7.9* 8.1* 8.6*  HCT 24.9* 26.9* 28.4*  MCV 76.4* 79.4* 78.2*  PLT 183 197 187    Scheduled Meds: . atorvastatin  40 mg Oral q1800  . Chlorhexidine Gluconate Cloth  6 each Topical Q0600  . citalopram  10 mg Oral Daily  . darbepoetin (ARANESP) injection - DIALYSIS  100 mcg Intravenous Q Tue-HD  . feeding supplement (NEPRO CARB STEADY)  237 mL Oral BID BM  . hydrALAZINE  100 mg Oral Q8H  . influenza vac split quadrivalent PF  0.5 mL Intramuscular Tomorrow-1000  . insulin aspart  0-9 Units Subcutaneous TID WC  . isosorbide mononitrate  60 mg Oral Daily  . labetalol  200 mg Oral BID  . lactulose  20 g Oral BID  . melatonin  5 mg Oral QHS  . multivitamin  1 tablet Oral QHS  . pantoprazole  40 mg Oral BID  . pneumococcal 23 valent vaccine  0.5 mL Intramuscular Tomorrow-1000  . polyethylene glycol  17 g Oral BID  . senna  2 tablet Oral QHS  . sevelamer carbonate  800 mg Oral TID WC  . sodium chloride flush  3 mL Intravenous Q12H   Continuous Infusions: . sodium chloride     PRN Meds:.sodium chloride, acetaminophen, benzonatate, bisacodyl, guaiFENesin-dextromethorphan, heparin sodium (porcine), levalbuterol,  LORazepam, ondansetron (ZOFRAN) IV, oxyCODONE-acetaminophen, sodium chloride flush     Assessment/Plan:  1.AKI on [id number] 2.6 in October 2021. Rapidly progressing kidney loss. Ultrasound with small kidneys. Possible renal artery stenosis based on kidney size. RIJ tunn catheter and LUE avGon 2/21 with vascular. First HD on 2/21 -Has been on HD per TTS schedule - Monitor for signs of recovery - seems guarded - Have started CLIP process as AKI - no spot yetand awaiting SNF placement.  2. Hypertension/volume-malignant hypertension on arrival with flash pulmonary edema intermittently. Blood pressure improved. Discontinued renal artery duplex as still not done and HTN controlled. Note have stopped minoxidil.   3. AnemiaCKD - s/p feraheme; aranesp 40 mcg once on 2/22and dose increased to 100 mcg for next dose 3/1 Tuesday. May need PRBC's soon.  4.Hypokalemia- resolved with supplementation  5.A. Fib-has been controlled here recently.could be causing some variable renal perfusion-cardiology has seen   6. Hyponatremia: mild Likely related to free water retention in setting of kidney disease.Improved with HD  7. Metabolic bone disease- intact PTH 169. on sevelamer. On renal diet. Improved phos.   8. Disposition- awaiting word from SNF [facility]) and has been accepted at [facility] and awaiting outpatient schedule  Governor [place]  Terrian Ridlon,  MD 12/31/2020, 8:42 AM

## 2020-12-31 NOTE — Progress Notes (Addendum)
PROGRESS NOTE        PATIENT DETAILS Name: Ruben Camacho. Age: 65 y.o. Sex: male Date of Birth: [birth year] Admit Date: 12/14/2020 Admitting Physician Lequita Halt, MD [id number], Ines Bloomer, MD  Brief Narrative: Patient is a 65 y.o. male with a history of CKD stage IV, DM-2, HLD presented with shortness of breath-he was found to have pulmonary edema in the setting of worsening renal function.  Hospital course complicated by possible upper GI bleeding and blood loss anemia.  Significant events: 2/14>> admit for worsening SOB-pulmonary edema in the setting of AKI on CKD stage IV. 2/18>> GI consult for coffee-ground emesis-patient refused EGD. 2/21>> started on HD  Significant studies: 2/14>> Echo: EF 40%, global hypokinesis, grade 2 diastolic dysfunction, RVSP 26.6 2/14>> chest x-ray: Bilateral airspace disease 2/15>> chest x-ray: Bilateral pulmonary infiltrate/edema 2/15>> renal ultrasound: Chronic medical renal disease-no hydronephrosis 2/20>> Echo: EF 65-70%, severe LVH, grade 1 diastolic dysfunction  Antimicrobial therapy: None  Microbiology data: None  Procedures : 2/21>> TDC and new left upper arm AVG  Consults: Nephrology Vascular surgery GI Cardiology  DVT Prophylaxis : SCD's given UGI   Subjective: Seen earlier in HD-no major issues-lying comfortably in bed.  Assessment/Plan: AKI on CKD stage IV: AKI likely hemodynamically mediated-on HD per discussion of nephrology.  Continue to monitor for signs of renal recovery.   Acute on chronic diastolic heart failure in the setting of worsening renal function: Volume status stable-diuresis with HD.  Acute hypoxic respiratory failure: Due to pulmonary edema-resolved  Upper GI bleeding: Resolved-GI consulted-patient refused EGD.  Hemoglobin stable.  Continue PPI twice daily.  Chronic microcytic anemia: Continue to follow closely-we will need outpatient endoscopic  evaluation.  Hyponatremia: Resolved  Anxiety/depression: Stable continue Celexa  Atrial tachycardia: Stable-continue labetalol-cardiology has signed off.    Minimally elevated troponins: Demand ischemia-cardiology planning on outpatient stress testing.  HTN: BP controlled-continue Imdur, hydralazine and labetalol   DM-2 (A1c 7.0 on 2/15): Continue SSI  Recent Labs    12/30/20 1700 12/30/20 2035 12/31/20 0720  GLUCAP 114* 149* 96   Deconditioning/debility: PT recommending SNF.  Nutrition Problem: Nutrition Problem: Severe Malnutrition Etiology: acute illness (SOB r/t CHF causing poor appetite) Signs/Symptoms: mild fat depletion,mild muscle depletion,moderate muscle depletion,moderate fat depletion Interventions: Ensure Enlive (each supplement provides 350kcal and 20 grams of protein)   Diet: Diet Order            Diet renal/carb modified with fluid restriction Fluid restriction: 1200 mL Fluid; Room service appropriate? Yes; Fluid consistency: Thin  Diet effective now                  Code Status: Full code  Family Communication: We will update family over the next few days  Disposition Plan: Status is: Inpatient  Remains inpatient appropriate because:Inpatient level of care appropriate due to severity of illness   Dispo: The patient is from: Home              Anticipated d/c is to: SNF              Patient currently is not medically stable to d/c.   Difficult to place patient No   Barriers to Discharge: AKI-awaiting clipping/SNF placement  Antimicrobial agents: Anti-infectives (From admission, onward)   Start     Dose/Rate Route Frequency Ordered Stop   12/18/20 0000  ceFAZolin (ANCEF) IVPB  1 g/50 mL premix  Status:  Discontinued       Note to Pharmacy: Send with pt to OR   1 g 100 mL/hr over 30 Minutes Intravenous On call 12/17/20 1511 12/17/20 1513   12/18/20 0000  ceFAZolin (ANCEF) IVPB 2g/100 mL premix       Note to Pharmacy: Send with pt to OR    2 g 200 mL/hr over 30 Minutes Intravenous On call 12/17/20 1513 12/19/20 0000       Time spent: 15- minutes-Greater than 50% of this time was spent in counseling, explanation of diagnosis, planning of further management, and coordination of care.  MEDICATIONS: Scheduled Meds: . atorvastatin  40 mg Oral q1800  . Chlorhexidine Gluconate Cloth  6 each Topical Q0600  . citalopram  10 mg Oral Daily  . darbepoetin (ARANESP) injection - DIALYSIS  100 mcg Intravenous Q Tue-HD  . feeding supplement (NEPRO CARB STEADY)  237 mL Oral BID BM  . heparin sodium (porcine)      . hydrALAZINE  100 mg Oral Q8H  . influenza vac split quadrivalent PF  0.5 mL Intramuscular Tomorrow-1000  . insulin aspart  0-9 Units Subcutaneous TID WC  . isosorbide mononitrate  60 mg Oral Daily  . labetalol  200 mg Oral BID  . lactulose  20 g Oral BID  . melatonin  5 mg Oral QHS  . multivitamin  1 tablet Oral QHS  . pantoprazole  40 mg Oral BID  . pneumococcal 23 valent vaccine  0.5 mL Intramuscular Tomorrow-1000  . polyethylene glycol  17 g Oral BID  . senna  2 tablet Oral QHS  . sevelamer carbonate  800 mg Oral TID WC  . sodium chloride flush  3 mL Intravenous Q12H   Continuous Infusions: . sodium chloride     PRN Meds:.sodium chloride, acetaminophen, benzonatate, bisacodyl, guaiFENesin-dextromethorphan, heparin sodium (porcine), levalbuterol, LORazepam, ondansetron (ZOFRAN) IV, oxyCODONE-acetaminophen, sodium chloride flush   PHYSICAL EXAM: Vital signs: Vitals:   12/31/20 0830 12/31/20 0900 12/31/20 0930 12/31/20 1000  BP: (!) 142/64 (!) 154/67 (!) 153/60 (!) 150/61  Pulse: 73 71 73 72  Resp:      Temp:      TempSrc:      SpO2:      Weight:      Height:       Filed Weights   12/30/20 0500 12/31/20 0548 12/31/20 0805  Weight: 70.5 kg 67.5 kg 67.4 kg   Body mass index is 21.94 kg/m.   Gen Exam:Alert awake-not in any distress HEENT:atraumatic, normocephalic Chest: B/L clear to auscultation  anteriorly CVS:S1S2 regular Abdomen:soft non tender, non distended Extremities:no edema Neurology: Non focal Skin: no rash  I have personally reviewed following labs and imaging studies  LABORATORY DATA: CBC: Recent Labs  Lab 12/26/20 0126 12/29/20 0725 12/30/20 0136  WBC 7.5 6.4 6.4  NEUTROABS  --   --  4.2  HGB 7.9* 8.1* 8.6*  HCT 24.9* 26.9* 28.4*  MCV 76.4* 79.4* 78.2*  PLT 183 197 [id number]    Basic Metabolic Panel: Recent Labs  Lab 12/25/20 0147 12/26/20 0126 12/27/20 0321 12/29/20 0725 12/30/20 0136 12/31/20 0711  NA 134* 131* 135 135 136 135  K 4.2 4.1 4.0 3.9 3.8 3.4*  CL 95* 95* 99 99 98 97*  CO2 '25 23 25 24 24 23  '$ GLUCOSE 111* 128* 112* 102* 116* 99  BUN 45* 70* 37* 81* 37* 56*  CREATININE 4.32* 5.16* 3.30* 5.21* 3.40* 5.27*  CALCIUM 8.4* 8.4*  8.6* 8.8* 8.7* 8.8*  PHOS 4.6 4.7* 3.2 5.0*  --  4.8*    GFR: Estimated Creatinine Clearance: 13.3 mL/min (A) (by C-G formula based on SCr of 5.27 mg/dL (H)).  Liver Function Tests: Recent Labs  Lab 12/25/20 0147 12/26/20 0126 12/27/20 0321 12/29/20 0725 12/31/20 0711  ALBUMIN 2.6* 2.6* 2.7* 2.6* 2.7*   No results for input(s): LIPASE, AMYLASE in the last 168 hours. No results for input(s): AMMONIA in the last 168 hours.  Coagulation Profile: No results for input(s): INR, PROTIME in the last 168 hours.  Cardiac Enzymes: No results for input(s): CKTOTAL, CKMB, CKMBINDEX, TROPONINI in the last 168 hours.  BNP (last 3 results) No results for input(s): PROBNP in the last 8760 hours.  Lipid Profile: No results for input(s): CHOL, HDL, LDLCALC, TRIG, CHOLHDL, LDLDIRECT in the last 72 hours.  Thyroid Function Tests: No results for input(s): TSH, T4TOTAL, FREET4, T3FREE, THYROIDAB in the last 72 hours.  Anemia Panel: No results for input(s): VITAMINB12, FOLATE, FERRITIN, TIBC, IRON, RETICCTPCT in the last 72 hours.  Urine analysis:    Component Value Date/Time   COLORURINE STRAW (A) 12/15/2020 2334    APPEARANCEUR CLEAR 12/15/2020 2334   LABSPEC 1.009 12/15/2020 2334   PHURINE 6.0 12/15/2020 2334   GLUCOSEU 50 (A) 12/15/2020 2334   HGBUR NEGATIVE 12/15/2020 2334   BILIRUBINUR NEGATIVE 12/15/2020 2334   BILIRUBINUR negative 09/04/2018 1030   KETONESUR NEGATIVE 12/15/2020 2334   PROTEINUR 100 (A) 12/15/2020 2334   UROBILINOGEN 0.2 09/04/2018 1030   UROBILINOGEN 1.0 12/08/2014 1035   NITRITE NEGATIVE 12/15/2020 2334   LEUKOCYTESUR NEGATIVE 12/15/2020 2334    Sepsis Labs: Lactic Acid, Venous    Component Value Date/Time   LATICACIDVEN 1.0 12/18/2020 0003    MICROBIOLOGY: No results found for this or any previous visit (from the past 240 hour(s)).  RADIOLOGY STUDIES/RESULTS: DG Abd 1 View  Result Date: 12/30/2020 CLINICAL DATA:  Constipation, rectal pain. EXAM: ABDOMEN - 1 VIEW COMPARISON:  December 18, 2020. FINDINGS: The bowel gas pattern is normal. Mild amount of stool is seen throughout the colon and rectum. No radio-opaque calculi or other significant radiographic abnormality are seen. IMPRESSION: Mild stool burden.  No evidence of bowel obstruction or ileus. Electronically Signed   By: Marijo Conception M.D.   On: 12/30/2020 08:16     LOS: 17 days   Oren Binet, MD  [facility]    To contact the attending provider between 7A-7P or the covering provider during after hours 7P-7A, please log into the web site [email or web address] and access using universal [place] password for that web site. If you do not have the password, please call the hospital operator.  12/31/2020, 11:17 AM

## 2021-01-01 DIAGNOSIS — E43 Unspecified severe protein-calorie malnutrition: Secondary | ICD-10-CM | POA: Diagnosis not present

## 2021-01-01 DIAGNOSIS — I10 Essential (primary) hypertension: Secondary | ICD-10-CM | POA: Diagnosis not present

## 2021-01-01 DIAGNOSIS — N186 End stage renal disease: Secondary | ICD-10-CM | POA: Diagnosis not present

## 2021-01-01 DIAGNOSIS — N179 Acute kidney failure, unspecified: Secondary | ICD-10-CM | POA: Diagnosis not present

## 2021-01-01 LAB — GLUCOSE, CAPILLARY
Glucose-Capillary: 109 mg/dL — ABNORMAL HIGH (ref 70–99)
Glucose-Capillary: 151 mg/dL — ABNORMAL HIGH (ref 70–99)
Glucose-Capillary: 270 mg/dL — ABNORMAL HIGH (ref 70–99)
Glucose-Capillary: 198 mg/dL — ABNORMAL HIGH (ref 70–99)

## 2021-01-01 LAB — RENAL FUNCTION PANEL
Albumin: 2.8 g/dL — ABNORMAL LOW (ref 3.5–5.0)
Anion gap: 12 (ref 5–15)
BUN: 32 mg/dL — ABNORMAL HIGH (ref 8–23)
CO2: 25 mmol/L (ref 22–32)
Calcium: 8.5 mg/dL — ABNORMAL LOW (ref 8.9–10.3)
Chloride: 97 mmol/L — ABNORMAL LOW (ref 98–111)
Creatinine, Ser: 3.83 mg/dL — ABNORMAL HIGH (ref 0.61–1.24)
GFR, Estimated: 17 mL/min — ABNORMAL LOW (ref >60)
Glucose, Bld: 117 mg/dL — ABNORMAL HIGH (ref 70–99)
Phosphorus: 2.9 mg/dL (ref 2.5–4.6)
Potassium: 3.6 mmol/L (ref 3.5–5.1)
Sodium: 134 mmol/L — ABNORMAL LOW (ref 135–145)

## 2021-01-01 MED ORDER — METHYLPREDNISOLONE SODIUM SUCC 125 MG IJ SOLR
60.0000 mg | Freq: Every day | INTRAMUSCULAR | Status: AC
  Administered 2021-01-01: 60 mg via INTRAVENOUS
  Filled 2021-01-01: qty 2

## 2021-01-01 MED ORDER — HYDROCOD POLST-CPM POLST ER 10-8 MG/5ML PO SUER
5.0000 mL | Freq: Two times a day (BID) | ORAL | Status: DC | PRN
  Administered 2021-01-01 – 2021-01-05 (×2): 5 mL via ORAL
  Filled 2021-01-01 (×2): qty 5

## 2021-01-01 MED ORDER — NEPRO/CARBSTEADY PO LIQD
237.0000 mL | Freq: Three times a day (TID) | ORAL | Status: DC
  Administered 2021-01-01 – 2021-01-08 (×11): 237 mL via ORAL
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
## 2021-01-01 NOTE — Progress Notes (Addendum)
OT Cancellation Note  Patient Details Name: Ruben Camacho. MRN: [id number] DOB: 03-08-1956   Cancelled Treatment:    Reason Eval/Treat Not Completed: Patient declined, no reason specified. Pt declines any therapy or OOB attempts at this time. Reports he did not sleep at all last night due to coughing, also reporting L LE gout pain, and that his bowels are bothering him. Encouraged activities of various avenues though pt adamantly declined. This is pt's 3rd consecutive refusal for OT sessions, so per protocol, will sign off at acute level. Please reconsult if pt more willing to consistently participate with OT.  Layla Maw 01/01/2021, 11:40 AM

## 2021-01-01 NOTE — Progress Notes (Signed)
PROGRESS NOTE        PATIENT DETAILS Name: Ruben Camacho. Age: 65 y.o. Sex: male Date of Birth: 21-Jun-1956 Admit Date: 12/14/2020 Admitting Physician Lequita Halt, MD [id number], Ines Bloomer, MD  Brief Narrative: Patient is a 65 y.o. male with a history of CKD stage IV, DM-2, HLD presented with shortness of breath-he was found to have pulmonary edema in the setting of worsening renal function.  Hospital course complicated by possible upper GI bleeding and blood loss anemia.  Significant events: 2/14>> admit for worsening SOB-pulmonary edema in the setting of AKI on CKD stage IV. 2/18>> GI consult for coffee-ground emesis-patient refused EGD. 2/21>> started on HD  Significant studies: 2/14>> Echo: EF 40%, global hypokinesis, grade 2 diastolic dysfunction, RVSP 26.6 2/14>> chest x-ray: Bilateral airspace disease 2/15>> chest x-ray: Bilateral pulmonary infiltrate/edema 2/15>> renal ultrasound: Chronic medical renal disease-no hydronephrosis 2/20>> Echo: EF 65-70%, severe LVH, grade 1 diastolic dysfunction  Antimicrobial therapy: None  Microbiology data: None  Procedures : 2/21>> TDC and new left upper arm AVG  Consults: Nephrology Vascular surgery GI Cardiology  DVT Prophylaxis : SCD's given UGI   Subjective: Lying comfortably in bed-only complaint is left knee pain-claims it may be his gout that is flaring up.  Assessment/Plan: AKI on CKD stage IV: AKI likely hemodynamically mediated-on HD per discussion of nephrology.  Continue to monitor for signs of renal recovery.   Acute on chronic diastolic heart failure in the setting of worsening renal function: Volume status stable-diuresis with HD.  Acute hypoxic respiratory failure: Due to pulmonary edema-resolved  Upper GI bleeding: Resolved-GI consulted-patient refused EGD.  Hemoglobin stable.  Continue PPI twice daily.  Chronic microcytic anemia: Continue to follow closely-we will need  outpatient endoscopic evaluation.  Left knee pain: No swelling/erythema evident-Per patient-current pain is similar to his prior gout flares.  Will start steroids-1 dose of IV Solu-Medrol today-reassess tomorrow-May need prednisone for a few days.  Hyponatremia: Resolved  Anxiety/depression: Stable continue Celexa  Atrial tachycardia: Stable-continue labetalol-cardiology has signed off.    Minimally elevated troponins: Demand ischemia-cardiology planning on outpatient stress testing.  HTN: BP controlled-continue Imdur, hydralazine and labetalol   DM-2 (A1c 7.0 on 2/15): Continue SSI  Recent Labs    12/31/20 2033 01/01/21 0730 01/01/21 1138  GLUCAP 98 109* 151*   Deconditioning/debility: PT recommending SNF.  Nutrition Problem: Nutrition Problem: Severe Malnutrition Etiology: acute illness (SOB r/t CHF causing poor appetite) Signs/Symptoms: mild fat depletion,mild muscle depletion,moderate muscle depletion,moderate fat depletion Interventions: Ensure Enlive (each supplement provides 350kcal and 20 grams of protein)   Diet: Diet Order            Diet renal/carb modified with fluid restriction Fluid restriction: 1200 mL Fluid; Room service appropriate? Yes; Fluid consistency: Thin  Diet effective now                  Code Status: Full code  Family Communication: Sister-Mary-[phone]-updated over the phone on 3/4  Disposition Plan: Status is: Inpatient  Remains inpatient appropriate because:Inpatient level of care appropriate due to severity of illness   Dispo: The patient is from: Home              Anticipated d/c is to: SNF              Patient currently is not medically stable to d/c.   Difficult to  place patient No   Barriers to Discharge: AKI-awaiting clipping/SNF placement  Antimicrobial agents: Anti-infectives (From admission, onward)   Start     Dose/Rate Route Frequency Ordered Stop   12/18/20 0000  ceFAZolin (ANCEF) IVPB 1 g/50 mL premix   Status:  Discontinued       Note to Pharmacy: Send with pt to OR   1 g 100 mL/hr over 30 Minutes Intravenous On call 12/17/20 1511 12/17/20 1513   12/18/20 0000  ceFAZolin (ANCEF) IVPB 2g/100 mL premix       Note to Pharmacy: Send with pt to OR   2 g 200 mL/hr over 30 Minutes Intravenous On call 12/17/20 1513 12/19/20 0000       Time spent: 15- minutes-Greater than 50% of this time was spent in counseling, explanation of diagnosis, planning of further management, and coordination of care.  MEDICATIONS: Scheduled Meds: . atorvastatin  40 mg Oral q1800  . Chlorhexidine Gluconate Cloth  6 each Topical Q0600  . citalopram  10 mg Oral Daily  . darbepoetin (ARANESP) injection - DIALYSIS  100 mcg Intravenous Q Tue-HD  . feeding supplement (NEPRO CARB STEADY)  237 mL Oral BID BM  . hydrALAZINE  100 mg Oral Q8H  . influenza vac split quadrivalent PF  0.5 mL Intramuscular Tomorrow-1000  . insulin aspart  0-9 Units Subcutaneous TID WC  . isosorbide mononitrate  60 mg Oral Daily  . labetalol  200 mg Oral BID  . lactulose  20 g Oral BID  . melatonin  5 mg Oral QHS  . multivitamin  1 tablet Oral QHS  . pantoprazole  40 mg Oral BID  . pneumococcal 23 valent vaccine  0.5 mL Intramuscular Tomorrow-1000  . polyethylene glycol  17 g Oral BID  . senna  2 tablet Oral QHS  . sevelamer carbonate  800 mg Oral TID WC  . sodium chloride flush  3 mL Intravenous Q12H   Continuous Infusions: . sodium chloride     PRN Meds:.sodium chloride, acetaminophen, benzonatate, bisacodyl, chlorpheniramine-HYDROcodone, guaiFENesin-dextromethorphan, heparin sodium (porcine), levalbuterol, LORazepam, ondansetron (ZOFRAN) IV, oxyCODONE-acetaminophen, sodium chloride flush   PHYSICAL EXAM: Vital signs: Vitals:   12/31/20 1336 12/31/20 2030 01/01/21 0428 01/01/21 1136  BP: (!) 152/65 133/64 (!) 141/67 (!) 155/63  Pulse: 77 68 71 80  Resp: '18 19 20 20  '$ Temp: 98.5 F (36.9 C) 99.1 F (37.3 C) 98.7 F (37.1 C)  98.4 F (36.9 C)  TempSrc: Oral Oral Oral Oral  SpO2: 98% 99% 96% 99%  Weight:      Height:       Filed Weights   12/31/20 0548 12/31/20 0805 12/31/20 1135  Weight: 67.5 kg 67.4 kg 65.9 kg   Body mass index is 21.45 kg/m.   Gen Exam:Alert awake-not in any distress HEENT:atraumatic, normocephalic Chest: B/L clear to auscultation anteriorly CVS:S1S2 regular Abdomen:soft non tender, non distended Extremities: Mild left knee tenderness-no swelling-no erythema-no major effusion evident. Neurology: Non focal Skin: no rash  I have personally reviewed following labs and imaging studies  LABORATORY DATA: CBC: Recent Labs  Lab 12/26/20 0126 12/29/20 0725 12/30/20 0136  WBC 7.5 6.4 6.4  NEUTROABS  --   --  4.2  HGB 7.9* 8.1* 8.6*  HCT 24.9* 26.9* 28.4*  MCV 76.4* 79.4* 78.2*  PLT 183 197 [id number]    Basic Metabolic Panel: Recent Labs  Lab 12/26/20 0126 12/27/20 0321 12/29/20 0725 12/30/20 0136 12/31/20 0711 01/01/21 0228  NA 131* 135 135 136 135 134*  K 4.1  4.0 3.9 3.8 3.4* 3.6  CL 95* 99 99 98 97* 97*  CO2 '23 25 24 24 23 25  '$ GLUCOSE 128* 112* 102* 116* 99 117*  BUN 70* 37* 81* 37* 56* 32*  CREATININE 5.16* 3.30* 5.21* 3.40* 5.27* 3.83*  CALCIUM 8.4* 8.6* 8.8* 8.7* 8.8* 8.5*  PHOS 4.7* 3.2 5.0*  --  4.8* 2.9    GFR: Estimated Creatinine Clearance: 17.9 mL/min (A) (by C-G formula based on SCr of 3.83 mg/dL (H)).  Liver Function Tests: Recent Labs  Lab 12/26/20 0126 12/27/20 0321 12/29/20 0725 12/31/20 0711 01/01/21 0228  ALBUMIN 2.6* 2.7* 2.6* 2.7* 2.8*   No results for input(s): LIPASE, AMYLASE in the last 168 hours. No results for input(s): AMMONIA in the last 168 hours.  Coagulation Profile: No results for input(s): INR, PROTIME in the last 168 hours.  Cardiac Enzymes: No results for input(s): CKTOTAL, CKMB, CKMBINDEX, TROPONINI in the last 168 hours.  BNP (last 3 results) No results for input(s): PROBNP in the last 8760 hours.  Lipid  Profile: No results for input(s): CHOL, HDL, LDLCALC, TRIG, CHOLHDL, LDLDIRECT in the last 72 hours.  Thyroid Function Tests: No results for input(s): TSH, T4TOTAL, FREET4, T3FREE, THYROIDAB in the last 72 hours.  Anemia Panel: No results for input(s): VITAMINB12, FOLATE, FERRITIN, TIBC, IRON, RETICCTPCT in the last 72 hours.  Urine analysis:    Component Value Date/Time   COLORURINE STRAW (A) 12/15/2020 2334   APPEARANCEUR CLEAR 12/15/2020 2334   LABSPEC 1.009 12/15/2020 2334   PHURINE 6.0 12/15/2020 2334   GLUCOSEU 50 (A) 12/15/2020 2334   HGBUR NEGATIVE 12/15/2020 2334   BILIRUBINUR NEGATIVE 12/15/2020 2334   BILIRUBINUR negative 09/04/2018 1030   KETONESUR NEGATIVE 12/15/2020 2334   PROTEINUR 100 (A) 12/15/2020 2334   UROBILINOGEN 0.2 09/04/2018 1030   UROBILINOGEN 1.0 12/08/2014 1035   NITRITE NEGATIVE 12/15/2020 2334   LEUKOCYTESUR NEGATIVE 12/15/2020 2334    Sepsis Labs: Lactic Acid, Venous    Component Value Date/Time   LATICACIDVEN 1.0 12/18/2020 0003    MICROBIOLOGY: No results found for this or any previous visit (from the past 240 hour(s)).  RADIOLOGY STUDIES/RESULTS: No results found.   LOS: 18 days   Oren Binet, MD  [facility]    To contact the attending provider between 7A-7P or the covering provider during after hours 7P-7A, please log into the web site [email or web address] and access using universal [place] password for that web site. If you do not have the password, please call the hospital operator.  01/01/2021, 2:27 PM

## 2021-01-01 NOTE — Progress Notes (Signed)
Patient ID: Ruben Clemmer., male   DOB: [birth year], 65 y.o.   MRN: [id number] S: No events overnight O:BP (!) 155/63 (BP Location: Right Arm)   Pulse 80   Temp 98.4 F (36.9 C) (Oral)   Resp 20   Ht '5\' 9"'$  (1.753 m)   Wt 65.9 kg   SpO2 99%   BMI 21.45 kg/m  No intake or output data in the 24 hours ending 01/01/21 1256 Intake/Output: I/O last 3 completed shifts: In: -  Out: 1250 [Urine:250; Other:1000]  Intake/Output this shift:  No intake/output data recorded. Weight change: -0.1 kg Gen: NAD CVS: RRR Resp: cta Abd: benign Ext: no edema, LUE AVG +T/B  Recent Labs  Lab 12/26/20 0126 12/27/20 0321 12/29/20 0725 12/30/20 0136 12/31/20 0711 01/01/21 0228  NA 131* 135 135 136 135 134*  K 4.1 4.0 3.9 3.8 3.4* 3.6  CL 95* 99 99 98 97* 97*  CO2 '23 25 24 24 23 25  '$ GLUCOSE 128* 112* 102* 116* 99 117*  BUN 70* 37* 81* 37* 56* 32*  CREATININE 5.16* 3.30* 5.21* 3.40* 5.27* 3.83*  ALBUMIN 2.6* 2.7* 2.6*  --  2.7* 2.8*  CALCIUM 8.4* 8.6* 8.8* 8.7* 8.8* 8.5*  PHOS 4.7* 3.2 5.0*  --  4.8* 2.9   Liver Function Tests: Recent Labs  Lab 12/29/20 0725 12/31/20 0711 01/01/21 0228  ALBUMIN 2.6* 2.7* 2.8*   No results for input(s): LIPASE, AMYLASE in the last 168 hours. No results for input(s): AMMONIA in the last 168 hours. CBC: Recent Labs  Lab 12/26/20 0126 12/29/20 0725 12/30/20 0136  WBC 7.5 6.4 6.4  NEUTROABS  --   --  4.2  HGB 7.9* 8.1* 8.6*  HCT 24.9* 26.9* 28.4*  MCV 76.4* 79.4* 78.2*  PLT 183 197 187   Cardiac Enzymes: No results for input(s): CKTOTAL, CKMB, CKMBINDEX, TROPONINI in the last 168 hours. CBG: Recent Labs  Lab 12/31/20 1200 12/31/20 1705 12/31/20 2033 01/01/21 0730 01/01/21 1138  GLUCAP 95 123* 98 109* 151*    Iron Studies: No results for input(s): IRON, TIBC, TRANSFERRIN, FERRITIN in the last 72 hours. Studies/Results: No results found. Marland Kitchen atorvastatin  40 mg Oral q1800  . Chlorhexidine Gluconate Cloth  6 each Topical Q0600  .  citalopram  10 mg Oral Daily  . darbepoetin (ARANESP) injection - DIALYSIS  100 mcg Intravenous Q Tue-HD  . feeding supplement (NEPRO CARB STEADY)  237 mL Oral BID BM  . hydrALAZINE  100 mg Oral Q8H  . influenza vac split quadrivalent PF  0.5 mL Intramuscular Tomorrow-1000  . insulin aspart  0-9 Units Subcutaneous TID WC  . isosorbide mononitrate  60 mg Oral Daily  . labetalol  200 mg Oral BID  . lactulose  20 g Oral BID  . melatonin  5 mg Oral QHS  . multivitamin  1 tablet Oral QHS  . pantoprazole  40 mg Oral BID  . pneumococcal 23 valent vaccine  0.5 mL Intramuscular Tomorrow-1000  . polyethylene glycol  17 g Oral BID  . senna  2 tablet Oral QHS  . sevelamer carbonate  800 mg Oral TID WC  . sodium chloride flush  3 mL Intravenous Q12H    BMET    Component Value Date/Time   NA 134 (L) 01/01/2021 0228   NA 136 08/06/2019 1400   K 3.6 01/01/2021 0228   CL 97 (L) 01/01/2021 0228   CO2 25 01/01/2021 0228   GLUCOSE 117 (H) 01/01/2021 0228   BUN 32 (H)  01/01/2021 0228   BUN 20 08/06/2019 1400   CREATININE 3.83 (H) 01/01/2021 0228   CREATININE 1.28 08/19/2015 0844   CALCIUM 8.5 (L) 01/01/2021 0228   GFRNONAA 17 (L) 01/01/2021 0228   GFRNONAA 76 12/01/2014 1037   GFRAA 33 (L) 08/06/2019 1400   GFRAA 88 12/01/2014 1037   CBC    Component Value Date/Time   WBC 6.4 12/30/2020 0136   RBC 3.63 (L) 12/30/2020 0136   HGB 8.6 (L) 12/30/2020 0136   HGB 13.4 09/04/2018 1211   HCT 28.4 (L) 12/30/2020 0136   HCT 41.1 09/04/2018 1211   PLT 187 12/30/2020 0136   PLT 190 09/04/2018 1211   MCV 78.2 (L) 12/30/2020 0136   MCV 71 (L) 09/04/2018 1211   MCH 23.7 (L) 12/30/2020 0136   MCHC 30.3 12/30/2020 0136   RDW 15.3 12/30/2020 0136   RDW 15.8 (H) 09/04/2018 1211   LYMPHSABS 1.3 12/30/2020 0136   MONOABS 0.8 12/30/2020 0136   EOSABS 0.1 12/30/2020 0136   BASOSABS 0.0 12/30/2020 0136     Assessment/Plan:  1.AKI on [id number] 2.6 in October 2021. Rapidly progressing kidney  loss. Ultrasound with small kidneys. Possible renal artery stenosis based on kidney size. RIJ tunn catheter and LUE avGon 2/21 with vascular. First HD on 2/21 -Has been on HD per TTS schedule - Monitor for signs of recovery - seems guarded - Have started CLIP process as AKI - no spot yetand awaiting SNF placement.  2. Hypertension/volume-malignant hypertension on arrival with flash pulmonary edema intermittently. Blood pressure improved. Discontinued renal artery duplex as still not done and HTN controlled. Note have stopped minoxidil.   3. AnemiaCKD - s/p feraheme; aranesp 40 mcg once on 2/22and dose increased to 100 mcg for next dose 3/1 Tuesday. May need PRBC's soon.  4.Hypokalemia- resolved with supplementation  5.A. Fib-has been controlled here recently.could be causing some variable renal perfusion-cardiology has seen   6. Hyponatremia: mild Likely related to free water retention in setting of kidney disease.Improved with HD  7. Metabolic bone disease- intact PTH 169. on sevelamer. On renal diet. Improved phos.   8. Disposition- [facility] and has been accepted at [facility] and awaiting outpatient schedule  [name], MD [facility] [phone]

## 2021-01-01 NOTE — Progress Notes (Signed)
Nutrition Follow-up  DOCUMENTATION CODES:   Severe malnutrition in context of acute illness/injury  INTERVENTION:  Provide Nepro Shake po TID, each supplement provides 425 kcal and 19 grams protein.  Encourage PO intake.   NUTRITION DIAGNOSIS:   Severe Malnutrition related to acute illness (SOB r/t CHF causing poor appetite) as evidenced by mild fat depletion,mild muscle depletion,moderate muscle depletion,moderate fat depletion; ongoing  GOAL:   Patient will meet greater than or equal to 90% of their needs; progressing  MONITOR:   PO intake,Supplement acceptance,Labs  REASON FOR ASSESSMENT:   Malnutrition Screening Tool    ASSESSMENT:   65 yo male admitted with increasing SOB, CHF decompensation r/t hypertensive urgency. PMH includes HTN, CKD-3b, DM-2, HLD. 2/21 tunneled HD catheter and LUE AV graft placed. Received first HD 2/21.  Meal completion has been 50-75%. Pt has been tolerating his PO. Pt currently has Nepro ordered with varied intake. RD to increase Nepro to TID to aid in caloric and protein needs especially as pt with malnutrition. Pt encouraged to eat his food at meals and to drink his supplements.    Labs and medications reviewed.   Diet Order:   Diet Order            Diet renal/carb modified with fluid restriction Fluid restriction: 1200 mL Fluid; Room service appropriate? Yes; Fluid consistency: Thin  Diet effective now                 EDUCATION NEEDS:   Not appropriate for education at this time  Skin:  Skin Assessment: Reviewed RN Assessment  Last BM:  3/2  Height:   Ht Readings from Last 1 Encounters:  12/17/20 '5\' 9"'$  (1.753 m)    Weight:   Wt Readings from Last 1 Encounters:  12/31/20 65.9 kg    Ideal Body Weight:  72.7 kg  BMI:  Body mass index is 21.45 kg/m.  Estimated Nutritional Needs:   Kcal:  2200-2500  Protein:  110-130 gm  Fluid:  1 L + UOP  Corrin Parker, MS, RD, LDN RD pager number/after hours weekend pager  number on Amion.

## 2021-01-01 NOTE — Progress Notes (Signed)
Physical Therapy Treatment Patient Details Name: Ruben Camacho. MRN: [id number] DOB: 04/15/1956 Today's Date: 01/01/2021    History of Present Illness Pt is a 65 y.o. male admitted 12/14/20 with SOB; workup for acute on chronic CHF, hypertensive urgency, AKI on CKD. Pt with coffee ground emesis; GI consult, but pt declined EGD. S/p tunneled RIJ HD cath and LUE AVG placement 2/21; HD initiated 2/21. PMH includes HTN, CKD, DM, CVA, gout.   PT Comments    Pt with bowel incontinence, requiring modA to stand with RW and mod-maxA to maintain balance due to bilateral knee instability, posterior lean and c/o L foot pain (pt attributes to gout). Pt dependent for posterior pericare while standing. Continues to endorse dizziness and fatigue; does not seem related to orthostatic or vestibular issues. Pt remains self-limiting. Increased time discussing importance of OOB mobility and participation with PT/OT for activity progression.    Follow Up Recommendations  SNF;Supervision for mobility/OOB     Equipment Recommendations  Rolling walker with 5" wheels;3in1 (PT)    Recommendations for Other Services       Precautions / Restrictions Precautions Precautions: Fall Precaution Comments: reported dizziness - does not seem orthostatic or vestibular-related; bowel incontinence Restrictions Weight Bearing Restrictions: No    Mobility  Bed Mobility Overal bed mobility: Needs Assistance Bed Mobility: Supine to Sit;Sit to Supine     Supine to sit: Min assist Sit to supine: Supervision   General bed mobility comments: MinA for HHA to elevate trunk, increased time and effort scooting hips to EOB    Transfers Overall transfer level: Needs assistance Equipment used: Rolling walker (2 wheeled) Transfers: Sit to/from Stand Sit to Stand: Mod assist;+2 safety/equipment         General transfer comment: Pt with bilateral knee instability almost falling from EOB with standing attempt requiring  modA to elevate trunk and maintain balance; endorses dizziness and L foot pain  Ambulation/Gait             General Gait Details: pt with uncontrolled return to sit when asked to take side steps towards [facility], reports limited by dizziness   Stairs             Wheelchair Mobility    Modified Rankin (Stroke Patients Only)       Balance Overall balance assessment: Needs assistance Sitting-balance support: Feet supported Sitting balance-Leahy Scale: Fair       Standing balance-Leahy Scale: Poor Standing balance comment: Reliant on UE support; inconsistent bilateral knee instability requiring min guard-maxA to maintain standing and prevent return to sitting; dependent for posterior pericare while standing                            Cognition Arousal/Alertness: Awake/alert Behavior During Therapy: Flat affect Overall Cognitive Status: No family/caregiver present to determine baseline cognitive functioning                                 General Comments: Difficult to determine true cognitive impairment vs. fatigue vs. desire to participate; slowed processing throughout session      Exercises      General Comments General comments (skin integrity, edema, etc.): Post-standing BP 134/60s. Increased time discussing importance of participation with PT/OT and OOB mobility (including with nursing staff) - pt replying "yes... yes... I know..." but question comprehension of information      Pertinent Vitals/Pain Pain Assessment: Faces  Faces Pain Scale: Hurts little more Pain Location: L foot gout (would not specify location) Pain Descriptors / Indicators: Discomfort;Grimacing;Guarding Pain Intervention(s): Monitored during session;Limited activity within patient's tolerance    Home Living                      Prior Function            PT Goals (current goals can now be found in the care plan section) Progress towards PT goals: Not  progressing toward goals - comment (self-limiting, fatigue)    Frequency    Min 2X/week      PT Plan Current plan remains appropriate    Co-evaluation              AM-PAC PT "6 Clicks" Mobility   Outcome Measure  Help needed turning from your back to your side while in a flat bed without using bedrails?: A Little Help needed moving from lying on your back to sitting on the side of a flat bed without using bedrails?: A Little Help needed moving to and from a bed to a chair (including a wheelchair)?: A Lot Help needed standing up from a chair using your arms (e.g., wheelchair or bedside chair)?: A Lot Help needed to walk in hospital room?: A Lot Help needed climbing 3-5 steps with a railing? : Total 6 Click Score: 13    End of Session   Activity Tolerance: Patient limited by fatigue Patient left: in bed;with call bell/phone within reach;with bed alarm set Nurse Communication: Mobility status PT Visit Diagnosis: Difficulty in walking, not elsewhere classified (R26.2);Dizziness and giddiness (R42)     Time: [id number] PT Time Calculation (min) (ACUTE ONLY): 13 min  Charges:  $Therapeutic Activity: 8-22 mins                     Mabeline Caras, PT, DPT Acute Rehabilitation Services  Pager [phone] Office [place] 01/01/2021, 5:14 PM

## 2021-01-02 DIAGNOSIS — I10 Essential (primary) hypertension: Secondary | ICD-10-CM | POA: Diagnosis not present

## 2021-01-02 DIAGNOSIS — N179 Acute kidney failure, unspecified: Secondary | ICD-10-CM | POA: Diagnosis not present

## 2021-01-02 DIAGNOSIS — E43 Unspecified severe protein-calorie malnutrition: Secondary | ICD-10-CM | POA: Diagnosis not present

## 2021-01-02 DIAGNOSIS — N186 End stage renal disease: Secondary | ICD-10-CM | POA: Diagnosis not present

## 2021-01-02 LAB — GLUCOSE, CAPILLARY
Glucose-Capillary: 105 mg/dL — ABNORMAL HIGH (ref 70–99)
Glucose-Capillary: 128 mg/dL — ABNORMAL HIGH (ref 70–99)
Glucose-Capillary: 100 mg/dL — ABNORMAL HIGH (ref 70–99)
Glucose-Capillary: 268 mg/dL — ABNORMAL HIGH (ref 70–99)

## 2021-01-02 LAB — RENAL FUNCTION PANEL
Albumin: 2.8 g/dL — ABNORMAL LOW (ref 3.5–5.0)
Anion gap: 13 (ref 5–15)
BUN: 55 mg/dL — ABNORMAL HIGH (ref 8–23)
CO2: 24 mmol/L (ref 22–32)
Calcium: 8.8 mg/dL — ABNORMAL LOW (ref 8.9–10.3)
Chloride: 95 mmol/L — ABNORMAL LOW (ref 98–111)
Creatinine, Ser: 5.38 mg/dL — ABNORMAL HIGH (ref 0.61–1.24)
GFR, Estimated: 11 mL/min — ABNORMAL LOW (ref >60)
Glucose, Bld: 118 mg/dL — ABNORMAL HIGH (ref 70–99)
Phosphorus: 4.5 mg/dL (ref 2.5–4.6)
Potassium: 3.4 mmol/L — ABNORMAL LOW (ref 3.5–5.1)
Sodium: 132 mmol/L — ABNORMAL LOW (ref 135–145)

## 2021-01-02 LAB — CBC
HCT: 28.7 % — ABNORMAL LOW (ref 39.0–52.0)
Hemoglobin: 8.7 g/dL — ABNORMAL LOW (ref 13.0–17.0)
MCH: 23.7 pg — ABNORMAL LOW (ref 26.0–34.0)
MCHC: 30.3 g/dL (ref 30.0–36.0)
MCV: 78.2 fL — ABNORMAL LOW (ref 80.0–100.0)
Platelets: 219 10*3/uL (ref 150–400)
RBC: 3.67 MIL/uL — ABNORMAL LOW (ref 4.22–5.81)
RDW: 15.6 % — ABNORMAL HIGH (ref 11.5–15.5)
WBC: 9.3 10*3/uL (ref 4.0–10.5)
nRBC: 0 % (ref 0.0–0.2)

## 2021-01-02 MED ORDER — POTASSIUM CHLORIDE CRYS ER 20 MEQ PO TBCR
40.0000 meq | EXTENDED_RELEASE_TABLET | Freq: Once | ORAL | Status: AC
  Administered 2021-01-02: 40 meq via ORAL
  Filled 2021-01-02: qty 2

## 2021-01-02 MED ORDER — PREDNISONE 20 MG PO TABS
40.0000 mg | ORAL_TABLET | Freq: Every day | ORAL | Status: AC
  Administered 2021-01-02 – 2021-01-03 (×2): 40 mg via ORAL
  Filled 2021-01-02 (×2): qty 2

## 2021-01-02 NOTE — Progress Notes (Signed)
PROGRESS NOTE        PATIENT DETAILS Name: Ruben Camacho. Age: 65 y.o. Sex: male Date of Birth: 1956/03/23 Admit Date: 12/14/2020 Admitting Physician Lequita Halt, MD [id number], Ines Bloomer, MD  Brief Narrative: Patient is a 65 y.o. male with a history of CKD stage IV, DM-2, HLD presented with shortness of breath-he was found to have pulmonary edema in the setting of worsening renal function.  Hospital course complicated by possible upper GI bleeding and blood loss anemia.  Significant events: 2/14>> admit for worsening SOB-pulmonary edema in the setting of AKI on CKD stage IV. 2/18>> GI consult for coffee-ground emesis-patient refused EGD. 2/21>> started on HD  Significant studies: 2/14>> Echo: EF 40%, global hypokinesis, grade 2 diastolic dysfunction, RVSP 26.6 2/14>> chest x-ray: Bilateral airspace disease 2/15>> chest x-ray: Bilateral pulmonary infiltrate/edema 2/15>> renal ultrasound: Chronic medical renal disease-no hydronephrosis 2/20>> Echo: EF 65-70%, severe LVH, grade 1 diastolic dysfunction  Antimicrobial therapy: None  Microbiology data: None  Procedures : 2/21>> TDC and new left upper arm AVG  Consults: Nephrology Vascular surgery GI Cardiology  DVT Prophylaxis : SCD's given UGI   Subjective: Left knee pain is better.  No chest pain or shortness of breath.  Claims he is having regular BMs.  Assessment/Plan: AKI on CKD stage IV: AKI likely hemodynamically mediated-on HD per discussion of nephrology.  Continue to monitor for signs of renal recovery.   Acute on chronic diastolic heart failure in the setting of worsening renal function: Volume status stable-diuresis with HD.  Acute hypoxic respiratory failure: Due to pulmonary edema-resolved  Upper GI bleeding: Resolved-GI consulted-patient refused EGD.  Hemoglobin stable.  Continue PPI twice daily.  Chronic microcytic anemia: Continue to follow closely-we will need  outpatient endoscopic evaluation.  Left knee pain: Probably gout flare-much better after IV steroids.  We will continue prednisone p.o. x2 more days. He has history of gout-and per patient-pain yesterday was very reminiscent of his usual gout pain.    Hyponatremia: Resolved  Anxiety/depression: Stable continue Celexa  Atrial tachycardia: Stable-continue labetalol-cardiology has signed off.    Minimally elevated troponins: Demand ischemia-cardiology planning on outpatient stress testing.  HTN: BP controlled-continue Imdur, hydralazine and labetalol   DM-2 (A1c 7.0 on 2/15): Continue SSI  Recent Labs    01/01/21 2037 01/02/21 0719 01/02/21 1133  GLUCAP 198* 105* 128*   Deconditioning/debility: PT recommending SNF.  Nutrition Problem: Nutrition Problem: Severe Malnutrition Etiology: acute illness (SOB r/t CHF causing poor appetite) Signs/Symptoms: mild fat depletion,mild muscle depletion,moderate muscle depletion,moderate fat depletion Interventions: Ensure Enlive (each supplement provides 350kcal and 20 grams of protein)   Diet: Diet Order            Diet renal/carb modified with fluid restriction Fluid restriction: 1200 mL Fluid; Room service appropriate? No; Fluid consistency: Thin  Diet effective now                  Code Status: Full code  Family Communication: Sister-Mary-[phone]-updated over the phone on 3/4  Disposition Plan: Status is: Inpatient  Remains inpatient appropriate because:Inpatient level of care appropriate due to severity of illness   Dispo: The patient is from: Home              Anticipated d/c is to: SNF              Patient currently is not  medically stable to d/c.   Difficult to place patient No   Barriers to Discharge: AKI-awaiting clipping/SNF placement  Antimicrobial agents: Anti-infectives (From admission, onward)   Start     Dose/Rate Route Frequency Ordered Stop   12/18/20 0000  ceFAZolin (ANCEF) IVPB 1 g/50 mL premix   Status:  Discontinued       Note to Pharmacy: Send with pt to OR   1 g 100 mL/hr over 30 Minutes Intravenous On call 12/17/20 1511 12/17/20 1513   12/18/20 0000  ceFAZolin (ANCEF) IVPB 2g/100 mL premix       Note to Pharmacy: Send with pt to OR   2 g 200 mL/hr over 30 Minutes Intravenous On call 12/17/20 1513 12/19/20 0000       Time spent: 15- minutes-Greater than 50% of this time was spent in counseling, explanation of diagnosis, planning of further management, and coordination of care.  MEDICATIONS: Scheduled Meds: . atorvastatin  40 mg Oral q1800  . Chlorhexidine Gluconate Cloth  6 each Topical Q0600  . citalopram  10 mg Oral Daily  . darbepoetin (ARANESP) injection - DIALYSIS  100 mcg Intravenous Q Tue-HD  . feeding supplement (NEPRO CARB STEADY)  237 mL Oral TID BM  . hydrALAZINE  100 mg Oral Q8H  . influenza vac split quadrivalent PF  0.5 mL Intramuscular Tomorrow-1000  . insulin aspart  0-9 Units Subcutaneous TID WC  . isosorbide mononitrate  60 mg Oral Daily  . labetalol  200 mg Oral BID  . lactulose  20 g Oral BID  . melatonin  5 mg Oral QHS  . multivitamin  1 tablet Oral QHS  . pantoprazole  40 mg Oral BID  . pneumococcal 23 valent vaccine  0.5 mL Intramuscular Tomorrow-1000  . polyethylene glycol  17 g Oral BID  . senna  2 tablet Oral QHS  . sevelamer carbonate  800 mg Oral TID WC  . sodium chloride flush  3 mL Intravenous Q12H   Continuous Infusions: . sodium chloride     PRN Meds:.sodium chloride, acetaminophen, benzonatate, bisacodyl, chlorpheniramine-HYDROcodone, guaiFENesin-dextromethorphan, heparin sodium (porcine), levalbuterol, LORazepam, ondansetron (ZOFRAN) IV, oxyCODONE-acetaminophen, sodium chloride flush   PHYSICAL EXAM: Vital signs: Vitals:   01/01/21 0428 01/01/21 1136 01/01/21 2036 01/02/21 0527  BP: (!) 141/67 (!) 155/63 132/65 (!) 158/72  Pulse: 71 80 77 76  Resp: '20 20 16 18  '$ Temp: 98.7 F (37.1 C) 98.4 F (36.9 C) 98.6 F (37 C)  97.6 F (36.4 C)  TempSrc: Oral Oral Oral Axillary  SpO2: 96% 99% 97%   Weight:    69 kg  Height:       Filed Weights   12/31/20 0805 12/31/20 1135 01/02/21 0527  Weight: 67.4 kg 65.9 kg 69 kg   Body mass index is 22.46 kg/m.   Gen Exam:Alert awake-not in any distress HEENT:atraumatic, normocephalic Chest: B/L clear to auscultation anteriorly CVS:S1S2 regular Abdomen:soft non tender, non distended Extremities: Left knee tenderness has essentially resolved. Neurology: Non focal Skin: no rash  I have personally reviewed following labs and imaging studies  LABORATORY DATA: CBC: Recent Labs  Lab 12/29/20 0725 12/30/20 0136 01/02/21 1049  WBC 6.4 6.4 9.3  NEUTROABS  --  4.2  --   HGB 8.1* 8.6* 8.7*  HCT 26.9* 28.4* 28.7*  MCV 79.4* 78.2* 78.2*  PLT 197 187 [id number]    Basic Metabolic Panel: Recent Labs  Lab 12/27/20 0321 12/29/20 0725 12/30/20 0136 12/31/20 0711 01/01/21 0228 01/02/21 1049  NA 135 135 136  135 134* 132*  K 4.0 3.9 3.8 3.4* 3.6 3.4*  CL 99 99 98 97* 97* 95*  CO2 '25 24 24 23 25 24  '$ GLUCOSE 112* 102* 116* 99 117* 118*  BUN 37* 81* 37* 56* 32* 55*  CREATININE 3.30* 5.21* 3.40* 5.27* 3.83* 5.38*  CALCIUM 8.6* 8.8* 8.7* 8.8* 8.5* 8.8*  PHOS 3.2 5.0*  --  4.8* 2.9 4.5    GFR: Estimated Creatinine Clearance: 13.4 mL/min (A) (by C-G formula based on SCr of 5.38 mg/dL (H)).  Liver Function Tests: Recent Labs  Lab 12/27/20 0321 12/29/20 0725 12/31/20 0711 01/01/21 0228 01/02/21 1049  ALBUMIN 2.7* 2.6* 2.7* 2.8* 2.8*   No results for input(s): LIPASE, AMYLASE in the last 168 hours. No results for input(s): AMMONIA in the last 168 hours.  Coagulation Profile: No results for input(s): INR, PROTIME in the last 168 hours.  Cardiac Enzymes: No results for input(s): CKTOTAL, CKMB, CKMBINDEX, TROPONINI in the last 168 hours.  BNP (last 3 results) No results for input(s): PROBNP in the last 8760 hours.  Lipid Profile: No results for input(s):  CHOL, HDL, LDLCALC, TRIG, CHOLHDL, LDLDIRECT in the last 72 hours.  Thyroid Function Tests: No results for input(s): TSH, T4TOTAL, FREET4, T3FREE, THYROIDAB in the last 72 hours.  Anemia Panel: No results for input(s): VITAMINB12, FOLATE, FERRITIN, TIBC, IRON, RETICCTPCT in the last 72 hours.  Urine analysis:    Component Value Date/Time   COLORURINE STRAW (A) 12/15/2020 2334   APPEARANCEUR CLEAR 12/15/2020 2334   LABSPEC 1.009 12/15/2020 2334   PHURINE 6.0 12/15/2020 2334   GLUCOSEU 50 (A) 12/15/2020 2334   HGBUR NEGATIVE 12/15/2020 2334   BILIRUBINUR NEGATIVE 12/15/2020 2334   BILIRUBINUR negative 09/04/2018 1030   KETONESUR NEGATIVE 12/15/2020 2334   PROTEINUR 100 (A) 12/15/2020 2334   UROBILINOGEN 0.2 09/04/2018 1030   UROBILINOGEN 1.0 12/08/2014 1035   NITRITE NEGATIVE 12/15/2020 2334   LEUKOCYTESUR NEGATIVE 12/15/2020 2334    Sepsis Labs: Lactic Acid, Venous    Component Value Date/Time   LATICACIDVEN 1.0 12/18/2020 0003    MICROBIOLOGY: No results found for this or any previous visit (from the past 240 hour(s)).  RADIOLOGY STUDIES/RESULTS: No results found.   LOS: 19 days   Oren Binet, MD  [facility]    To contact the attending provider between 7A-7P or the covering provider during after hours 7P-7A, please log into the web site [email or web address] and access using universal [place] password for that web site. If you do not have the password, please call the hospital operator.  01/02/2021, 1:30 PM

## 2021-01-02 NOTE — Progress Notes (Signed)
Patient ID: Ruben Camacho., male   DOB: Jun 24, 1956, 65 y.o.   MRN: [id number] S: no complaints O:BP (!) 158/72 (BP Location: Right Arm)   Pulse 76   Temp 97.6 F (36.4 C) (Axillary)   Resp 18   Ht '5\' 9"'$  (1.753 m)   Wt 69 kg   SpO2 97%   BMI 22.46 kg/m   Intake/Output Summary (Last 24 hours) at 01/02/2021 1210 Last data filed at 01/02/2021 0600 Gross per 24 hour  Intake 333 ml  Output 250 ml  Net 83 ml   Intake/Output: I/O last 3 completed shifts: In: 333 [P.O.:330; I.V.:3] Out: 250 [Urine:250]  Intake/Output this shift:  No intake/output data recorded. Weight change: 1.6 kg Gen: NAD CVS: RRR Resp: cta Abd: benign Ext: no edema, LUE AVG +T/B  Recent Labs  Lab 12/27/20 0321 12/29/20 0725 12/30/20 0136 12/31/20 0711 01/01/21 0228 01/02/21 1049  NA 135 135 136 135 134* 132*  K 4.0 3.9 3.8 3.4* 3.6 3.4*  CL 99 99 98 97* 97* 95*  CO2 '25 24 24 23 25 24  '$ GLUCOSE 112* 102* 116* 99 117* 118*  BUN 37* 81* 37* 56* 32* 55*  CREATININE 3.30* 5.21* 3.40* 5.27* 3.83* 5.38*  ALBUMIN 2.7* 2.6*  --  2.7* 2.8* 2.8*  CALCIUM 8.6* 8.8* 8.7* 8.8* 8.5* 8.8*  PHOS 3.2 5.0*  --  4.8* 2.9 4.5   Liver Function Tests: Recent Labs  Lab 12/31/20 0711 01/01/21 0228 01/02/21 1049  ALBUMIN 2.7* 2.8* 2.8*   No results for input(s): LIPASE, AMYLASE in the last 168 hours. No results for input(s): AMMONIA in the last 168 hours. CBC: Recent Labs  Lab 12/29/20 0725 12/30/20 0136 01/02/21 1049  WBC 6.4 6.4 9.3  NEUTROABS  --  4.2  --   HGB 8.1* 8.6* 8.7*  HCT 26.9* 28.4* 28.7*  MCV 79.4* 78.2* 78.2*  PLT 197 187 219   Cardiac Enzymes: No results for input(s): CKTOTAL, CKMB, CKMBINDEX, TROPONINI in the last 168 hours. CBG: Recent Labs  Lab 01/01/21 1138 01/01/21 1647 01/01/21 2037 01/02/21 0719 01/02/21 1133  GLUCAP 151* 270* 198* 105* 128*    Iron Studies: No results for input(s): IRON, TIBC, TRANSFERRIN, FERRITIN in the last 72 hours. Studies/Results: No results  found. Marland Kitchen atorvastatin  40 mg Oral q1800  . Chlorhexidine Gluconate Cloth  6 each Topical Q0600  . citalopram  10 mg Oral Daily  . darbepoetin (ARANESP) injection - DIALYSIS  100 mcg Intravenous Q Tue-HD  . feeding supplement (NEPRO CARB STEADY)  237 mL Oral TID BM  . hydrALAZINE  100 mg Oral Q8H  . influenza vac split quadrivalent PF  0.5 mL Intramuscular Tomorrow-1000  . insulin aspart  0-9 Units Subcutaneous TID WC  . isosorbide mononitrate  60 mg Oral Daily  . labetalol  200 mg Oral BID  . lactulose  20 g Oral BID  . melatonin  5 mg Oral QHS  . multivitamin  1 tablet Oral QHS  . pantoprazole  40 mg Oral BID  . pneumococcal 23 valent vaccine  0.5 mL Intramuscular Tomorrow-1000  . polyethylene glycol  17 g Oral BID  . senna  2 tablet Oral QHS  . sevelamer carbonate  800 mg Oral TID WC  . sodium chloride flush  3 mL Intravenous Q12H    BMET    Component Value Date/Time   NA 132 (L) 01/02/2021 1049   NA 136 08/06/2019 1400   K 3.4 (L) 01/02/2021 1049   CL 95 (  L) 01/02/2021 1049   CO2 24 01/02/2021 1049   GLUCOSE 118 (H) 01/02/2021 1049   BUN 55 (H) 01/02/2021 1049   BUN 20 08/06/2019 1400   CREATININE 5.38 (H) 01/02/2021 1049   CREATININE 1.28 08/19/2015 0844   CALCIUM 8.8 (L) 01/02/2021 1049   GFRNONAA 11 (L) 01/02/2021 1049   GFRNONAA 76 12/01/2014 1037   GFRAA 33 (L) 08/06/2019 1400   GFRAA 88 12/01/2014 1037   CBC    Component Value Date/Time   WBC 9.3 01/02/2021 1049   RBC 3.67 (L) 01/02/2021 1049   HGB 8.7 (L) 01/02/2021 1049   HGB 13.4 09/04/2018 1211   HCT 28.7 (L) 01/02/2021 1049   HCT 41.1 09/04/2018 1211   PLT 219 01/02/2021 1049   PLT 190 09/04/2018 1211   MCV 78.2 (L) 01/02/2021 1049   MCV 71 (L) 09/04/2018 1211   MCH 23.7 (L) 01/02/2021 1049   MCHC 30.3 01/02/2021 1049   RDW 15.6 (H) 01/02/2021 1049   RDW 15.8 (H) 09/04/2018 1211   LYMPHSABS 1.3 12/30/2020 0136   MONOABS 0.8 12/30/2020 0136   EOSABS 0.1 12/30/2020 0136   BASOSABS 0.0  12/30/2020 0136   Assessment/Plan:  1.AKI on [id number] 2.6 in October 2021. Rapidly progressing kidney loss. Ultrasound with small kidneys. Possible renal artery stenosis based on kidney size. RIJ tunn catheter and LUE avGon 2/21 with vascular. First HD on 2/21 -Has been on HD per TTS schedule - Monitor for signs of recovery - seems guarded - Have started CLIP process as AKI - no spot yetand awaiting SNF placement.  2. Hypertension/volume-malignant hypertension on arrival with flash pulmonary edema intermittently. Blood pressure improved. Discontinued renal artery duplex as still not done and HTN controlled. Note have stopped minoxidil.   3. AnemiaCKD - s/p feraheme; aranesp 40 mcg once on 2/22and dose increased to 100 mcg for next dose 3/1 Tuesday. May need PRBC's soon.  4.Hypokalemia- resolved with supplementation  5.A. Fib-has been controlled here recently.could be causing some variable renal perfusion-cardiology has seen   6. Hyponatremia: mild Likely related to free water retention in setting of kidney disease.Improved with HD  7. Metabolic bone disease- intact PTH 169. on sevelamer. On renal diet. Improved phos.   8. Disposition- awaitingword fromSNF([facility]) and has been accepted at [facility] and awaiting outpatient schedule  Ruben Potts, MD [facility] [phone]

## 2021-01-03 DIAGNOSIS — N179 Acute kidney failure, unspecified: Secondary | ICD-10-CM | POA: Diagnosis not present

## 2021-01-03 DIAGNOSIS — I10 Essential (primary) hypertension: Secondary | ICD-10-CM | POA: Diagnosis not present

## 2021-01-03 LAB — RENAL FUNCTION PANEL
Albumin: 2.8 g/dL — ABNORMAL LOW (ref 3.5–5.0)
Anion gap: 11 (ref 5–15)
BUN: 31 mg/dL — ABNORMAL HIGH (ref 8–23)
CO2: 24 mmol/L (ref 22–32)
Calcium: 9.2 mg/dL (ref 8.9–10.3)
Chloride: 100 mmol/L (ref 98–111)
Creatinine, Ser: 3.71 mg/dL — ABNORMAL HIGH (ref 0.61–1.24)
GFR, Estimated: 17 mL/min — ABNORMAL LOW (ref >60)
Glucose, Bld: 159 mg/dL — ABNORMAL HIGH (ref 70–99)
Phosphorus: 3.4 mg/dL (ref 2.5–4.6)
Potassium: 5 mmol/L (ref 3.5–5.1)
Sodium: 135 mmol/L (ref 135–145)

## 2021-01-03 LAB — GLUCOSE, CAPILLARY
Glucose-Capillary: 146 mg/dL — ABNORMAL HIGH (ref 70–99)
Glucose-Capillary: 146 mg/dL — ABNORMAL HIGH (ref 70–99)
Glucose-Capillary: 191 mg/dL — ABNORMAL HIGH (ref 70–99)
Glucose-Capillary: 144 mg/dL — ABNORMAL HIGH (ref 70–99)

## 2021-01-03 MED ORDER — LACTULOSE 10 GM/15ML PO SOLN
20.0000 g | Freq: Every day | ORAL | Status: DC
  Administered 2021-01-04 – 2021-01-08 (×2): 20 g via ORAL
  Filled 2021-01-03 (×6): qty 30

## 2021-01-03 NOTE — Progress Notes (Signed)
Patient ID: Ruben Adu., male   DOB: [birth year], 65 y.o.   MRN: [id number] S: No complaints O:BP (!) 138/59 (BP Location: Right Arm)   Pulse 69   Temp 98.6 F (37 C) (Oral)   Resp 15   Ht '5\' 9"'$  (1.753 m)   Wt 69 kg   SpO2 96%   BMI 22.46 kg/m   Intake/Output Summary (Last 24 hours) at 01/03/2021 1150 Last data filed at 01/03/2021 0933 Gross per 24 hour  Intake 600 ml  Output 2350 ml  Net -1750 ml   Intake/Output: I/O last 3 completed shifts: In: 930 [P.O.:927; I.V.:3] Out: 2600 [Urine:600; Other:2000]  Intake/Output this shift:  Total I/O In: 240 [P.O.:240] Out: -  Weight change: -0.2 kg Gen: NAD CVS: RRR Resp: CTA Abd: +BS,soft, NT/ND Ext: no edema LUE AVG +T/B  Recent Labs  Lab 12/29/20 0725 12/30/20 0136 12/31/20 0711 01/01/21 0228 01/02/21 1049 01/03/21 0342  NA 135 136 135 134* 132* 135  K 3.9 3.8 3.4* 3.6 3.4* 5.0  CL 99 98 97* 97* 95* 100  CO2 '24 24 23 25 24 24  '$ GLUCOSE 102* 116* 99 117* 118* 159*  BUN 81* 37* 56* 32* 55* 31*  CREATININE 5.21* 3.40* 5.27* 3.83* 5.38* 3.71*  ALBUMIN 2.6*  --  2.7* 2.8* 2.8* 2.8*  CALCIUM 8.8* 8.7* 8.8* 8.5* 8.8* 9.2  PHOS 5.0*  --  4.8* 2.9 4.5 3.4   Liver Function Tests: Recent Labs  Lab 01/01/21 0228 01/02/21 1049 01/03/21 0342  ALBUMIN 2.8* 2.8* 2.8*   No results for input(s): LIPASE, AMYLASE in the last 168 hours. No results for input(s): AMMONIA in the last 168 hours. CBC: Recent Labs  Lab 12/29/20 0725 12/30/20 0136 01/02/21 1049  WBC 6.4 6.4 9.3  NEUTROABS  --  4.2  --   HGB 8.1* 8.6* 8.7*  HCT 26.9* 28.4* 28.7*  MCV 79.4* 78.2* 78.2*  PLT 197 187 219   Cardiac Enzymes: No results for input(s): CKTOTAL, CKMB, CKMBINDEX, TROPONINI in the last 168 hours. CBG: Recent Labs  Lab 01/02/21 1133 01/02/21 1755 01/02/21 2134 01/03/21 0722 01/03/21 1134  GLUCAP 128* 100* 268* 146* 146*    Iron Studies: No results for input(s): IRON, TIBC, TRANSFERRIN, FERRITIN in the last 72  hours. Studies/Results: No results found. Marland Kitchen atorvastatin  40 mg Oral q1800  . Chlorhexidine Gluconate Cloth  6 each Topical Q0600  . citalopram  10 mg Oral Daily  . darbepoetin (ARANESP) injection - DIALYSIS  100 mcg Intravenous Q Tue-HD  . feeding supplement (NEPRO CARB STEADY)  237 mL Oral TID BM  . hydrALAZINE  100 mg Oral Q8H  . influenza vac split quadrivalent PF  0.5 mL Intramuscular Tomorrow-1000  . insulin aspart  0-9 Units Subcutaneous TID WC  . isosorbide mononitrate  60 mg Oral Daily  . labetalol  200 mg Oral BID  . lactulose  20 g Oral BID  . melatonin  5 mg Oral QHS  . multivitamin  1 tablet Oral QHS  . pantoprazole  40 mg Oral BID  . pneumococcal 23 valent vaccine  0.5 mL Intramuscular Tomorrow-1000  . polyethylene glycol  17 g Oral BID  . senna  2 tablet Oral QHS  . sevelamer carbonate  800 mg Oral TID WC  . sodium chloride flush  3 mL Intravenous Q12H    BMET    Component Value Date/Time   NA 135 01/03/2021 0342   NA 136 08/06/2019 1400   K 5.0 01/03/2021 0342  CL 100 01/03/2021 0342   CO2 24 01/03/2021 0342   GLUCOSE 159 (H) 01/03/2021 0342   BUN 31 (H) 01/03/2021 0342   BUN 20 08/06/2019 1400   CREATININE 3.71 (H) 01/03/2021 0342   CREATININE 1.28 08/19/2015 0844   CALCIUM 9.2 01/03/2021 0342   GFRNONAA 17 (L) 01/03/2021 0342   GFRNONAA 76 12/01/2014 1037   GFRAA 33 (L) 08/06/2019 1400   GFRAA 88 12/01/2014 1037   CBC    Component Value Date/Time   WBC 9.3 01/02/2021 1049   RBC 3.67 (L) 01/02/2021 1049   HGB 8.7 (L) 01/02/2021 1049   HGB 13.4 09/04/2018 1211   HCT 28.7 (L) 01/02/2021 1049   HCT 41.1 09/04/2018 1211   PLT 219 01/02/2021 1049   PLT 190 09/04/2018 1211   MCV 78.2 (L) 01/02/2021 1049   MCV 71 (L) 09/04/2018 1211   MCH 23.7 (L) 01/02/2021 1049   MCHC 30.3 01/02/2021 1049   RDW 15.6 (H) 01/02/2021 1049   RDW 15.8 (H) 09/04/2018 1211   LYMPHSABS 1.3 12/30/2020 0136   MONOABS 0.8 12/30/2020 0136   EOSABS 0.1 12/30/2020 0136    BASOSABS 0.0 12/30/2020 0136     Assessment/Plan:  1.AKI on [id number] 2.6 in October 2021. Rapidly progressing kidney loss. Ultrasound with small kidneys. Possible renal artery stenosis based on kidney size. RIJ tunn catheter and LUE avGon 2/21 with vascular. First HD on 2/21 -Has been on HD per TTS schedule - Monitor for signs of recovery - seems guarded - Have started CLIP process as AKI - no spot yetand awaiting SNF placement.  2. Hypertension/volume-malignant hypertension on arrival with flash pulmonary edema intermittently. Blood pressure improved. Discontinued renal artery duplex as still not done and HTN controlled. Note have stopped minoxidil.   3. AnemiaCKD - s/p feraheme; aranesp 40 mcg once on 2/22and dose increased to 100 mcg for next dose 3/1 Tuesday. May need PRBC's soon.  4.Hypokalemia- resolved with supplementation  5.A. Fib-has been controlled here recently.could be causing some variable renal perfusion-cardiology has seen   6. Hyponatremia: mild Likely related to free water retention in setting of kidney disease.Improved with HD  7. Metabolic bone disease- intact PTH 169. on sevelamer. On renal diet. Improved phos.   8. Disposition- awaitingword fromSNF([facility]) and has been accepted at [facility] and awaiting outpatient schedule  [name], MD [facility] [phone]

## 2021-01-03 NOTE — Plan of Care (Signed)
  Problem: Health Behavior/Discharge Planning: Goal: Ability to manage health-related needs will improve Outcome: Progressing   Problem: Clinical Measurements: Goal: Ability to maintain clinical measurements within normal limits will improve Outcome: Progressing Goal: Will remain free from infection Outcome: Progressing Goal: Diagnostic test results will improve Outcome: Progressing Goal: Respiratory complications will improve Outcome: Progressing Goal: Cardiovascular complication will be avoided Outcome: Progressing   Problem: Education: Goal: Knowledge of General Education information will improve Description: Including pain rating scale, medication(s)/side effects and non-pharmacologic comfort measures Outcome: Progressing   Problem: Activity: Goal: Risk for activity intolerance will decrease Outcome: Not Progressing   Problem: Nutrition: Goal: Adequate nutrition will be maintained Outcome: Progressing   Problem: Elimination: Goal: Will not experience complications related to bowel motility Outcome: Progressing Goal: Will not experience complications related to urinary retention Outcome: Progressing   Problem: Safety: Goal: Ability to remain free from injury will improve Outcome: Progressing

## 2021-01-03 NOTE — Progress Notes (Signed)
PROGRESS NOTE        PATIENT DETAILS Name: Ruben Camacho. Age: 65 y.o. Sex: male Date of Birth: 1956/07/06 Admit Date: 12/14/2020 Admitting Physician Lequita Halt, MD [id number], Ines Bloomer, MD  Brief Narrative: Patient is a 65 y.o. male with a history of CKD stage IV, DM-2, HLD presented with shortness of breath-he was found to have pulmonary edema in the setting of worsening renal function.  Hospital course complicated by possible upper GI bleeding and blood loss anemia.  Significant events: 2/14>> admit for worsening SOB-pulmonary edema in the setting of AKI on CKD stage IV. 2/18>> GI consult for coffee-ground emesis-patient refused EGD. 2/21>> started on HD  Significant studies: 2/14>> Echo: EF 40%, global hypokinesis, grade 2 diastolic dysfunction, RVSP 26.6 2/14>> chest x-ray: Bilateral airspace disease 2/15>> chest x-ray: Bilateral pulmonary infiltrate/edema 2/15>> renal ultrasound: Chronic medical renal disease-no hydronephrosis 2/20>> Echo: EF 65-70%, severe LVH, grade 1 diastolic dysfunction  Antimicrobial therapy: None  Microbiology data: None  Procedures : 2/21>> TDC and new left upper arm AVG  Consults: Nephrology Vascular surgery GI Cardiology  DVT Prophylaxis : SCD's given UGI   Subjective: No major issues-left knee pain has completely resolved.  Assessment/Plan: AKI on CKD stage IV: AKI likely hemodynamically mediated-on HD per discussion of nephrology.  Continue to monitor for signs of renal recovery.   Acute on chronic diastolic heart failure in the setting of worsening renal function: Volume status stable-diuresis with HD.  Acute hypoxic respiratory failure: Due to pulmonary edema-resolved  Upper GI bleeding: Resolved-GI consulted-patient refused EGD.  Hemoglobin stable.  Continue PPI twice daily.  Chronic microcytic anemia: Continue to follow closely-we will need outpatient endoscopic evaluation.  Left knee  pain: Probably gout flare-much better after initiation of steroids.. He has history of gout-and per patient-pain yesterday was very reminiscent of his usual gout pain.    Hyponatremia: Resolved  Anxiety/depression: Stable continue Celexa  Atrial tachycardia: Stable-continue labetalol-cardiology has signed off.    Minimally elevated troponins: Demand ischemia-cardiology planning on outpatient stress testing.  HTN: BP controlled-continue Imdur, hydralazine and labetalol   DM-2 (A1c 7.0 on 2/15): Continue SSI  Recent Labs    01/02/21 2134 01/03/21 0722 01/03/21 1134  GLUCAP 268* 146* 146*   Deconditioning/debility: PT recommending SNF.  Constipation: S/p tapwater enema-now on bowel regimen with MiraLAX/lactulose/ Senokot  Nutrition Problem: Nutrition Problem: Severe Malnutrition Etiology: acute illness (SOB r/t CHF causing poor appetite) Signs/Symptoms: mild fat depletion,mild muscle depletion,moderate muscle depletion,moderate fat depletion Interventions: Ensure Enlive (each supplement provides 350kcal and 20 grams of protein)   Diet: Diet Order            Diet renal/carb modified with fluid restriction Fluid restriction: 1200 mL Fluid; Room service appropriate? No; Fluid consistency: Thin  Diet effective now                  Code Status: Full code  Family Communication: Sister-Mary-[phone]-updated over the phone on 3/4  Disposition Plan: Status is: Inpatient  Remains inpatient appropriate because:Inpatient level of care appropriate due to severity of illness   Dispo: The patient is from: Home              Anticipated d/c is to: SNF              Patient currently is not medically stable to d/c.   Difficult to place  patient No   Barriers to Discharge: AKI-awaiting clipping/SNF placement  Antimicrobial agents: Anti-infectives (From admission, onward)   Start     Dose/Rate Route Frequency Ordered Stop   12/18/20 0000  ceFAZolin (ANCEF) IVPB 1 g/50 mL  premix  Status:  Discontinued       Note to Pharmacy: Send with pt to OR   1 g 100 mL/hr over 30 Minutes Intravenous On call 12/17/20 1511 12/17/20 1513   12/18/20 0000  ceFAZolin (ANCEF) IVPB 2g/100 mL premix       Note to Pharmacy: Send with pt to OR   2 g 200 mL/hr over 30 Minutes Intravenous On call 12/17/20 1513 12/19/20 0000       Time spent: 15- minutes-Greater than 50% of this time was spent in counseling, explanation of diagnosis, planning of further management, and coordination of care.  MEDICATIONS: Scheduled Meds: . atorvastatin  40 mg Oral q1800  . Chlorhexidine Gluconate Cloth  6 each Topical Q0600  . citalopram  10 mg Oral Daily  . darbepoetin (ARANESP) injection - DIALYSIS  100 mcg Intravenous Q Tue-HD  . feeding supplement (NEPRO CARB STEADY)  237 mL Oral TID BM  . hydrALAZINE  100 mg Oral Q8H  . influenza vac split quadrivalent PF  0.5 mL Intramuscular Tomorrow-1000  . insulin aspart  0-9 Units Subcutaneous TID WC  . isosorbide mononitrate  60 mg Oral Daily  . labetalol  200 mg Oral BID  . lactulose  20 g Oral BID  . melatonin  5 mg Oral QHS  . multivitamin  1 tablet Oral QHS  . pantoprazole  40 mg Oral BID  . pneumococcal 23 valent vaccine  0.5 mL Intramuscular Tomorrow-1000  . polyethylene glycol  17 g Oral BID  . senna  2 tablet Oral QHS  . sevelamer carbonate  800 mg Oral TID WC  . sodium chloride flush  3 mL Intravenous Q12H   Continuous Infusions: . sodium chloride     PRN Meds:.sodium chloride, acetaminophen, benzonatate, bisacodyl, chlorpheniramine-HYDROcodone, guaiFENesin-dextromethorphan, heparin sodium (porcine), levalbuterol, LORazepam, ondansetron (ZOFRAN) IV, oxyCODONE-acetaminophen, sodium chloride flush   PHYSICAL EXAM: Vital signs: Vitals:   01/02/21 1756 01/02/21 2133 01/03/21 0409 01/03/21 1358  BP: (!) 144/64 (!) 149/66 (!) 138/59 (!) 150/56  Pulse: 75 77 69 70  Resp: '20 15 15 18  '$ Temp: 98.5 F (36.9 C) 100 F (37.8 C) 98.6 F  (37 C) 98.7 F (37.1 C)  TempSrc: Oral Oral Oral Oral  SpO2: 96% 96% 96% 98%  Weight:   69 kg   Height:       Filed Weights   01/02/21 1353 01/02/21 1730 01/03/21 0409  Weight: 68.8 kg 66.8 kg 69 kg   Body mass index is 22.46 kg/m.   Gen Exam:Alert awake-not in any distress HEENT:atraumatic, normocephalic Chest: B/L clear to auscultation anteriorly CVS:S1S2 regular Abdomen:soft non tender, non distended Extremities: Left knee tenderness has essentially resolved. Neurology: Non focal Skin: no rash  I have personally reviewed following labs and imaging studies  LABORATORY DATA: CBC: Recent Labs  Lab 12/29/20 0725 12/30/20 0136 01/02/21 1049  WBC 6.4 6.4 9.3  NEUTROABS  --  4.2  --   HGB 8.1* 8.6* 8.7*  HCT 26.9* 28.4* 28.7*  MCV 79.4* 78.2* 78.2*  PLT 197 187 [id number]    Basic Metabolic Panel: Recent Labs  Lab 12/29/20 0725 12/30/20 0136 12/31/20 0711 01/01/21 0228 01/02/21 1049 01/03/21 0342  NA 135 136 135 134* 132* 135  K 3.9 3.8 3.4*  3.6 3.4* 5.0  CL 99 98 97* 97* 95* 100  CO2 '24 24 23 25 24 24  '$ GLUCOSE 102* 116* 99 117* 118* 159*  BUN 81* 37* 56* 32* 55* 31*  CREATININE 5.21* 3.40* 5.27* 3.83* 5.38* 3.71*  CALCIUM 8.8* 8.7* 8.8* 8.5* 8.8* 9.2  PHOS 5.0*  --  4.8* 2.9 4.5 3.4    GFR: Estimated Creatinine Clearance: 19.4 mL/min (A) (by C-G formula based on SCr of 3.71 mg/dL (H)).  Liver Function Tests: Recent Labs  Lab 12/29/20 0725 12/31/20 0711 01/01/21 0228 01/02/21 1049 01/03/21 0342  ALBUMIN 2.6* 2.7* 2.8* 2.8* 2.8*   No results for input(s): LIPASE, AMYLASE in the last 168 hours. No results for input(s): AMMONIA in the last 168 hours.  Coagulation Profile: No results for input(s): INR, PROTIME in the last 168 hours.  Cardiac Enzymes: No results for input(s): CKTOTAL, CKMB, CKMBINDEX, TROPONINI in the last 168 hours.  BNP (last 3 results) No results for input(s): PROBNP in the last 8760 hours.  Lipid Profile: No results for  input(s): CHOL, HDL, LDLCALC, TRIG, CHOLHDL, LDLDIRECT in the last 72 hours.  Thyroid Function Tests: No results for input(s): TSH, T4TOTAL, FREET4, T3FREE, THYROIDAB in the last 72 hours.  Anemia Panel: No results for input(s): VITAMINB12, FOLATE, FERRITIN, TIBC, IRON, RETICCTPCT in the last 72 hours.  Urine analysis:    Component Value Date/Time   COLORURINE STRAW (A) 12/15/2020 2334   APPEARANCEUR CLEAR 12/15/2020 2334   LABSPEC 1.009 12/15/2020 2334   PHURINE 6.0 12/15/2020 2334   GLUCOSEU 50 (A) 12/15/2020 2334   HGBUR NEGATIVE 12/15/2020 2334   BILIRUBINUR NEGATIVE 12/15/2020 2334   BILIRUBINUR negative 09/04/2018 1030   KETONESUR NEGATIVE 12/15/2020 2334   PROTEINUR 100 (A) 12/15/2020 2334   UROBILINOGEN 0.2 09/04/2018 1030   UROBILINOGEN 1.0 12/08/2014 1035   NITRITE NEGATIVE 12/15/2020 2334   LEUKOCYTESUR NEGATIVE 12/15/2020 2334    Sepsis Labs: Lactic Acid, Venous    Component Value Date/Time   LATICACIDVEN 1.0 12/18/2020 0003    MICROBIOLOGY: No results found for this or any previous visit (from the past 240 hour(s)).  RADIOLOGY STUDIES/RESULTS: No results found.   LOS: 20 days   Oren Binet, MD  [facility]    To contact the attending provider between 7A-7P or the covering provider during after hours 7P-7A, please log into the web site [email or web address] and access using universal [place] password for that web site. If you do not have the password, please call the hospital operator.  01/03/2021, 2:09 PM

## 2021-01-04 DIAGNOSIS — N179 Acute kidney failure, unspecified: Secondary | ICD-10-CM | POA: Diagnosis not present

## 2021-01-04 DIAGNOSIS — I10 Essential (primary) hypertension: Secondary | ICD-10-CM | POA: Diagnosis not present

## 2021-01-04 LAB — GLUCOSE, CAPILLARY
Glucose-Capillary: 96 mg/dL (ref 70–99)
Glucose-Capillary: 198 mg/dL — ABNORMAL HIGH (ref 70–99)
Glucose-Capillary: 142 mg/dL — ABNORMAL HIGH (ref 70–99)
Glucose-Capillary: 138 mg/dL — ABNORMAL HIGH (ref 70–99)

## 2021-01-04 NOTE — Progress Notes (Signed)
At Approximately 1000 I was called into the pt's room by the physical therapist. Upon entering pt was up in recliner and the pt and physical therapist stated that he became dizzy when she got him up. The pt stated that he felt dizzy and nauseated. Pt was hypotensive 80's/ 60's. I asked the pt if he wanted to stay in the chair since he was feeling so dizzy at this moment. He stated he wanted back to bed. The physcial therapist and I assisted the pt back to bed via sit to stand. Once the pt was up he started coughing and vomiting. When I asked him if he was ok he stared blankly into the distance and was not responding (eyes remained open). I then asked Gerald Stabs CNA standing at door to call a rapid since I was helping to hold/move the pt back to bed. Upon getting the pt back to bed, I assessed him, obtained his VS and Scientist, research (medical) retrieved Dr. Sloan Leiter. The rapid nurse also came to the room and an EKG was done on the pt. The pt's BP was stable 130's/70's once back in bed and he stated that he felt much better. He denied any chest pain or pain otherwise. Will cont to monitor.

## 2021-01-04 NOTE — Progress Notes (Signed)
Physical Therapy Treatment Patient Details Name: Ruben Camacho. MRN: [id number] DOB: 1956/02/13 Today's Date: 01/04/2021    History of Present Illness Pt is a 65 y.o. male admitted 12/14/20 with SOB; workup for acute on chronic CHF, hypertensive urgency, AKI on CKD. Pt with coffee ground emesis; GI consult, but pt declined EGD. S/p tunneled RIJ HD cath and LUE AVG placement 2/21; HD initiated 2/21. PMH includes HTN, CKD, DM, CVA, gout.    PT Comments    Pt with increased encouragement from physicians and nursing to participate in therapy today. Pt agreeable and is supervision to come to EoB, and min guard for power up to [place]. Pt transferred to in front of recliner and able to perform 10x sit<>stand from low recliner surface and 2 bouts of standing 20 sec and 30 sec. Pt fine with sitting back in recliner. As therapist, returning tray table and call bell pt with c/o of dizziness BP 89/65. RN notified and requested assist with getting pt back to bed. Pt minAx2 for power up to [facility]. Pt with decreased responsiveness to commands and palor with attempt to stand to return to bed. Pt with vomiting, eyes open and decreased command follow. RN called Rapid Response. Pt able to stand high enough to remove Stedy pads and sit back down and requires maxAx2 for return to bed. BP rebounded. MD responded. Pt needs to work on positional tolerance to be able to safely progress mobility. D/c plans remain appropriate. PT will continue to follow acutely.     Follow Up Recommendations  SNF;Supervision for mobility/OOB     Equipment Recommendations  Rolling walker with 5" wheels;3in1 (PT)       Precautions / Restrictions Precautions Precautions: Fall Precaution Comments: dizziness after therapy, slightly orthostatic, syncope with return to bed Restrictions Weight Bearing Restrictions: No    Mobility  Bed Mobility Overal bed mobility: Needs Assistance Bed Mobility: Supine to Sit;Sit to Supine     Supine  to sit: Supervision;HOB elevated Sit to supine: Max assist;+2 for physical assistance   General bed mobility comments: supervision for coming to EoB increased effort but ultimately able to achieve with use of bedrail, max Ax2 for return to bed after period of unresponsiveness on Stedy    Transfers Overall transfer level: Needs assistance Equipment used: Rolling walker (2 wheeled) Transfers: Sit to/from Stand Sit to Stand: +2 safety/equipment;Min guard;Min assist         General transfer comment: min guard for power up from elevated surface to [place], moved chair and able to perform 10x sit<>stand in steady with min guard, heavy UE use to pull up to standing however no knee buckling, for return to bed once feeling dizzy requires min Ax2 from recliner and Stedy  Ambulation/Gait             General Gait Details: worked on pregait activity and Sport and exercise psychologist Overall balance assessment: Needs assistance Sitting-balance support: Feet supported Sitting balance-Leahy Scale: Fair       Standing balance-Leahy Scale: Poor Standing balance comment: reliant on UE support on Stedy, no knee buckling in standing today                            Cognition Arousal/Alertness: Awake/alert Behavior During Therapy: Flat affect Overall Cognitive Status: No family/caregiver present to determine baseline cognitive functioning  General Comments: slowed processing and decreased responsee to questions, agreeable to working with therapy      Exercises Other Exercises Other Exercises: 10x sit<>stand in [place] Other Exercises: 2 bouts of standing balance, 20 sec and 30 sec    General Comments General comments (skin integrity, edema, etc.): pt with good participation with therapy, however as pt being set up at end of session pt with c/o of dizziness, and nausea, post standing BP 89/65, notified RN requested back to bed, pt with  vomiting and syncopal episode in [place], Rapid Response called. with return to bed pt with increased responsiveness and BP rebound,      Pertinent Vitals/Pain Pain Assessment: No/denies pain           PT Goals (current goals can now be found in the care plan section) Acute Rehab PT Goals PT Goal Formulation: With patient Time For Goal Achievement: 01/07/21 Potential to Achieve Goals: Good Progress towards PT goals: Progressing toward goals (limited by syncope)    Frequency    Min 2X/week      PT Plan Current plan remains appropriate       AM-PAC PT "6 Clicks" Mobility   Outcome Measure  Help needed turning from your back to your side while in a flat bed without using bedrails?: A Little Help needed moving from lying on your back to sitting on the side of a flat bed without using bedrails?: A Little Help needed moving to and from a bed to a chair (including a wheelchair)?: A Lot Help needed standing up from a chair using your arms (e.g., wheelchair or bedside chair)?: A Lot Help needed to walk in hospital room?: A Lot Help needed climbing 3-5 steps with a railing? : Total 6 Click Score: 13    End of Session Equipment Utilized During Treatment: Gait belt Activity Tolerance: Treatment limited secondary to medical complications (Comment);Patient tolerated treatment well (initially tolerated well, after session dizziness and nausea and vomiting, syncopal episode) Patient left: in bed;with call bell/phone within reach;with bed alarm set Nurse Communication: Mobility status PT Visit Diagnosis: Difficulty in walking, not elsewhere classified (R26.2);Dizziness and giddiness (R42)     Time: [id number] PT Time Calculation (min) (ACUTE ONLY): 37 min  Charges:  $Therapeutic Exercise: 8-22 mins $Therapeutic Activity: 8-22 mins                     Evelynn Hench B. Migdalia Dk PT, DPT Acute Rehabilitation Services Pager [phone] Office [phone]     [place] 01/04/2021, 10:50 AM

## 2021-01-04 NOTE — Progress Notes (Signed)
PROGRESS NOTE        PATIENT DETAILS Name: Ruben Camacho. Age: 65 y.o. Sex: male Date of Birth: September 16, 1956 Admit Date: 12/14/2020 Admitting Physician Lequita Halt, MD [id number], Ines Bloomer, MD  Brief Narrative: Patient is a 65 y.o. male with a history of CKD stage IV, DM-2, HLD presented with shortness of breath-he was found to have pulmonary edema in the setting of worsening renal function.  Hospital course complicated by possible upper GI bleeding and blood loss anemia.  Significant events: 2/14>> admit for worsening SOB-pulmonary edema in the setting of AKI on CKD stage IV. 2/18>> GI consult for coffee-ground emesis-patient refused EGD. 2/21>> started on HD  Significant studies: 2/14>> Echo: EF 40%, global hypokinesis, grade 2 diastolic dysfunction, RVSP 26.6 2/14>> chest x-ray: Bilateral airspace disease 2/15>> chest x-ray: Bilateral pulmonary infiltrate/edema 2/15>> renal ultrasound: Chronic medical renal disease-no hydronephrosis 2/20>> Echo: EF 65-70%, severe LVH, grade 1 diastolic dysfunction  Antimicrobial therapy: None  Microbiology data: None  Procedures : 2/21>> TDC and new left upper arm AVG  Consults: Nephrology Vascular surgery GI Cardiology  DVT Prophylaxis : SCD's given UGI bleed  Subjective: Lying comfortably in bed-no chest pain or shortness of breath.  Left knee pain has resolved.  Having daily BMs per patient.  Assessment/Plan: AKI on CKD stage IV: AKI likely hemodynamically mediated-on HD per discussion of nephrology.  Continue to monitor for signs of renal recovery.   Acute on chronic diastolic heart failure in the setting of worsening renal function: Volume status stable-diuresis with HD.  Acute hypoxic respiratory failure: Due to pulmonary edema-resolved  Upper GI bleeding: Resolved-GI consulted-patient refused EGD.  Hemoglobin stable.  Continue PPI twice daily.  Chronic microcytic anemia: Continue to  follow closely-we will need outpatient endoscopic evaluation.  Left knee pain: Probably gout flare-much better after initiation of steroids.. He has history of gout-and per patient-pain yesterday was very reminiscent of his usual gout pain.    Hyponatremia: Resolved  Anxiety/depression: Stable continue Celexa  Atrial tachycardia: Stable-continue labetalol-cardiology has signed off.    Minimally elevated troponins: Demand ischemia-cardiology planning on outpatient stress testing.  HTN: BP controlled-continue Imdur, hydralazine and labetalol   DM-2 (A1c 7.0 on 2/15): Continue SSI  Recent Labs    01/03/21 1649 01/03/21 2130 01/04/21 0809  GLUCAP 191* 144* 96   Deconditioning/debility: PT recommending SNF.  Constipation: S/p tapwater enema-now on bowel regimen with MiraLAX/lactulose/ Senokot  Nutrition Problem: Nutrition Problem: Severe Malnutrition Etiology: acute illness (SOB r/t CHF causing poor appetite) Signs/Symptoms: mild fat depletion,mild muscle depletion,moderate muscle depletion,moderate fat depletion Interventions: Ensure Enlive (each supplement provides 350kcal and 20 grams of protein)   Diet: Diet Order            Diet renal/carb modified with fluid restriction Fluid restriction: 1200 mL Fluid; Room service appropriate? No; Fluid consistency: Thin  Diet effective now                  Code Status: Full code  Family Communication: Sister-Mary-[phone]-updated over the phone on 3/4  Disposition Plan: Status is: Inpatient  Remains inpatient appropriate because:Inpatient level of care appropriate due to severity of illness   Dispo: The patient is from: Home              Anticipated d/c is to: SNF  Patient currently is not medically stable to d/c.   Difficult to place patient No   Barriers to Discharge: AKI-awaiting clipping/SNF placement  Antimicrobial agents: Anti-infectives (From admission, onward)   Start     Dose/Rate Route  Frequency Ordered Stop   12/18/20 0000  ceFAZolin (ANCEF) IVPB 1 g/50 mL premix  Status:  Discontinued       Note to Pharmacy: Send with pt to OR   1 g 100 mL/hr over 30 Minutes Intravenous On call 12/17/20 1511 12/17/20 1513   12/18/20 0000  ceFAZolin (ANCEF) IVPB 2g/100 mL premix       Note to Pharmacy: Send with pt to OR   2 g 200 mL/hr over 30 Minutes Intravenous On call 12/17/20 1513 12/19/20 0000       Time spent: 15- minutes-Greater than 50% of this time was spent in counseling, explanation of diagnosis, planning of further management, and coordination of care.  MEDICATIONS: Scheduled Meds: . atorvastatin  40 mg Oral q1800  . Chlorhexidine Gluconate Cloth  6 each Topical Q0600  . citalopram  10 mg Oral Daily  . darbepoetin (ARANESP) injection - DIALYSIS  100 mcg Intravenous Q Tue-HD  . feeding supplement (NEPRO CARB STEADY)  237 mL Oral TID BM  . hydrALAZINE  100 mg Oral Q8H  . influenza vac split quadrivalent PF  0.5 mL Intramuscular Tomorrow-1000  . insulin aspart  0-9 Units Subcutaneous TID WC  . isosorbide mononitrate  60 mg Oral Daily  . labetalol  200 mg Oral BID  . lactulose  20 g Oral Daily  . melatonin  5 mg Oral QHS  . multivitamin  1 tablet Oral QHS  . pantoprazole  40 mg Oral BID  . pneumococcal 23 valent vaccine  0.5 mL Intramuscular Tomorrow-1000  . polyethylene glycol  17 g Oral BID  . senna  2 tablet Oral QHS  . sevelamer carbonate  800 mg Oral TID WC  . sodium chloride flush  3 mL Intravenous Q12H   Continuous Infusions: . sodium chloride     PRN Meds:.sodium chloride, acetaminophen, benzonatate, bisacodyl, chlorpheniramine-HYDROcodone, guaiFENesin-dextromethorphan, heparin sodium (porcine), levalbuterol, LORazepam, ondansetron (ZOFRAN) IV, oxyCODONE-acetaminophen, sodium chloride flush   PHYSICAL EXAM: Vital signs: Vitals:   01/03/21 2012 01/04/21 0515 01/04/21 0800 01/04/21 0801  BP: (!) 146/70 (!) 145/68  (!) 147/71  Pulse: 77 72  70  Resp:  16 16    Temp: 99 F (37.2 C) 98.4 F (36.9 C)    TempSrc: Oral Oral    SpO2: 97% 94% 95%   Weight:      Height:       Filed Weights   01/02/21 1353 01/02/21 1730 01/03/21 0409  Weight: 68.8 kg 66.8 kg 69 kg   Body mass index is 22.46 kg/m.   Gen Exam:Alert awake-not in any distress HEENT:atraumatic, normocephalic Chest: B/L clear to auscultation anteriorly CVS:S1S2 regular Abdomen:soft non tender, non distended Extremities: Left knee tenderness has essentially resolved. Neurology: Non focal Skin: no rash  I have personally reviewed following labs and imaging studies  LABORATORY DATA: CBC: Recent Labs  Lab 12/29/20 0725 12/30/20 0136 01/02/21 1049  WBC 6.4 6.4 9.3  NEUTROABS  --  4.2  --   HGB 8.1* 8.6* 8.7*  HCT 26.9* 28.4* 28.7*  MCV 79.4* 78.2* 78.2*  PLT 197 187 [id number]    Basic Metabolic Panel: Recent Labs  Lab 12/29/20 0725 12/30/20 0136 12/31/20 0711 01/01/21 0228 01/02/21 1049 01/03/21 0342  NA 135 136 135 134* 132* 135  K 3.9 3.8 3.4* 3.6 3.4* 5.0  CL 99 98 97* 97* 95* 100  CO2 '24 24 23 25 24 24  '$ GLUCOSE 102* 116* 99 117* 118* 159*  BUN 81* 37* 56* 32* 55* 31*  CREATININE 5.21* 3.40* 5.27* 3.83* 5.38* 3.71*  CALCIUM 8.8* 8.7* 8.8* 8.5* 8.8* 9.2  PHOS 5.0*  --  4.8* 2.9 4.5 3.4    GFR: Estimated Creatinine Clearance: 19.4 mL/min (A) (by C-G formula based on SCr of 3.71 mg/dL (H)).  Liver Function Tests: Recent Labs  Lab 12/29/20 0725 12/31/20 0711 01/01/21 0228 01/02/21 1049 01/03/21 0342  ALBUMIN 2.6* 2.7* 2.8* 2.8* 2.8*   No results for input(s): LIPASE, AMYLASE in the last 168 hours. No results for input(s): AMMONIA in the last 168 hours.  Coagulation Profile: No results for input(s): INR, PROTIME in the last 168 hours.  Cardiac Enzymes: No results for input(s): CKTOTAL, CKMB, CKMBINDEX, TROPONINI in the last 168 hours.  BNP (last 3 results) No results for input(s): PROBNP in the last 8760 hours.  Lipid Profile: No results  for input(s): CHOL, HDL, LDLCALC, TRIG, CHOLHDL, LDLDIRECT in the last 72 hours.  Thyroid Function Tests: No results for input(s): TSH, T4TOTAL, FREET4, T3FREE, THYROIDAB in the last 72 hours.  Anemia Panel: No results for input(s): VITAMINB12, FOLATE, FERRITIN, TIBC, IRON, RETICCTPCT in the last 72 hours.  Urine analysis:    Component Value Date/Time   COLORURINE STRAW (A) 12/15/2020 2334   APPEARANCEUR CLEAR 12/15/2020 2334   LABSPEC 1.009 12/15/2020 2334   PHURINE 6.0 12/15/2020 2334   GLUCOSEU 50 (A) 12/15/2020 2334   HGBUR NEGATIVE 12/15/2020 2334   BILIRUBINUR NEGATIVE 12/15/2020 2334   BILIRUBINUR negative 09/04/2018 1030   KETONESUR NEGATIVE 12/15/2020 2334   PROTEINUR 100 (A) 12/15/2020 2334   UROBILINOGEN 0.2 09/04/2018 1030   UROBILINOGEN 1.0 12/08/2014 1035   NITRITE NEGATIVE 12/15/2020 2334   LEUKOCYTESUR NEGATIVE 12/15/2020 2334    Sepsis Labs: Lactic Acid, Venous    Component Value Date/Time   LATICACIDVEN 1.0 12/18/2020 0003    MICROBIOLOGY: No results found for this or any previous visit (from the past 240 hour(s)).  RADIOLOGY STUDIES/RESULTS: No results found.   LOS: 21 days   Oren Binet, MD  [facility]    To contact the attending provider between 7A-7P or the covering provider during after hours 7P-7A, please log into the web site [email or web address] and access using universal [place] password for that web site. If you do not have the password, please call the hospital operator.  01/04/2021, 10:19 AM

## 2021-01-04 NOTE — Progress Notes (Signed)
Patient ID: Ruben Camacho., male   DOB: [birth year], 65 y.o.   MRN: [id number] S: No complaints.  Resting comfortably O:BP 131/72 (BP Location: Right Arm)   Pulse 76   Temp 98.4 F (36.9 C) (Oral)   Resp 16   Ht '5\' 9"'$  (1.753 m)   Wt 69 kg   SpO2 95%   BMI 22.46 kg/m   Intake/Output Summary (Last 24 hours) at 01/04/2021 1155 Last data filed at 01/04/2021 0600 Gross per 24 hour  Intake -  Output 150 ml  Net -150 ml   Intake/Output: I/O last 3 completed shifts: In: 480 [P.O.:480] Out: 300 [Urine:300]  Intake/Output this shift:  No intake/output data recorded. Weight change:  Gen: NAD, lying in bed CVS: Normal rate Resp: Bilateral chest rise, no increased work of breathing Abd: +BS,soft, NT/ND Ext: no edema LUE AVG +T/B  Recent Labs  Lab 12/29/20 0725 12/30/20 0136 12/31/20 0711 01/01/21 0228 01/02/21 1049 01/03/21 0342  NA 135 136 135 134* 132* 135  K 3.9 3.8 3.4* 3.6 3.4* 5.0  CL 99 98 97* 97* 95* 100  CO2 '24 24 23 25 24 24  '$ GLUCOSE 102* 116* 99 117* 118* 159*  BUN 81* 37* 56* 32* 55* 31*  CREATININE 5.21* 3.40* 5.27* 3.83* 5.38* 3.71*  ALBUMIN 2.6*  --  2.7* 2.8* 2.8* 2.8*  CALCIUM 8.8* 8.7* 8.8* 8.5* 8.8* 9.2  PHOS 5.0*  --  4.8* 2.9 4.5 3.4   Liver Function Tests: Recent Labs  Lab 01/01/21 0228 01/02/21 1049 01/03/21 0342  ALBUMIN 2.8* 2.8* 2.8*   No results for input(s): LIPASE, AMYLASE in the last 168 hours. No results for input(s): AMMONIA in the last 168 hours. CBC: Recent Labs  Lab 12/29/20 0725 12/30/20 0136 01/02/21 1049  WBC 6.4 6.4 9.3  NEUTROABS  --  4.2  --   HGB 8.1* 8.6* 8.7*  HCT 26.9* 28.4* 28.7*  MCV 79.4* 78.2* 78.2*  PLT 197 187 219   Cardiac Enzymes: No results for input(s): CKTOTAL, CKMB, CKMBINDEX, TROPONINI in the last 168 hours. CBG: Recent Labs  Lab 01/03/21 0722 01/03/21 1134 01/03/21 1649 01/03/21 2130 01/04/21 0809  GLUCAP 146* 146* 191* 144* 96    Iron Studies: No results for input(s): IRON, TIBC,  TRANSFERRIN, FERRITIN in the last 72 hours. Studies/Results: No results found. Marland Kitchen atorvastatin  40 mg Oral q1800  . Chlorhexidine Gluconate Cloth  6 each Topical Q0600  . citalopram  10 mg Oral Daily  . darbepoetin (ARANESP) injection - DIALYSIS  100 mcg Intravenous Q Tue-HD  . feeding supplement (NEPRO CARB STEADY)  237 mL Oral TID BM  . hydrALAZINE  100 mg Oral Q8H  . influenza vac split quadrivalent PF  0.5 mL Intramuscular Tomorrow-1000  . insulin aspart  0-9 Units Subcutaneous TID WC  . isosorbide mononitrate  60 mg Oral Daily  . labetalol  200 mg Oral BID  . lactulose  20 g Oral Daily  . melatonin  5 mg Oral QHS  . multivitamin  1 tablet Oral QHS  . pantoprazole  40 mg Oral BID  . pneumococcal 23 valent vaccine  0.5 mL Intramuscular Tomorrow-1000  . polyethylene glycol  17 g Oral BID  . senna  2 tablet Oral QHS  . sevelamer carbonate  800 mg Oral TID WC  . sodium chloride flush  3 mL Intravenous Q12H    BMET    Component Value Date/Time   NA 135 01/03/2021 0342   NA 136 08/06/2019 1400  K 5.0 01/03/2021 0342   CL 100 01/03/2021 0342   CO2 24 01/03/2021 0342   GLUCOSE 159 (H) 01/03/2021 0342   BUN 31 (H) 01/03/2021 0342   BUN 20 08/06/2019 1400   CREATININE 3.71 (H) 01/03/2021 0342   CREATININE 1.28 08/19/2015 0844   CALCIUM 9.2 01/03/2021 0342   GFRNONAA 17 (L) 01/03/2021 0342   GFRNONAA 76 12/01/2014 1037   GFRAA 33 (L) 08/06/2019 1400   GFRAA 88 12/01/2014 1037   CBC    Component Value Date/Time   WBC 9.3 01/02/2021 1049   RBC 3.67 (L) 01/02/2021 1049   HGB 8.7 (L) 01/02/2021 1049   HGB 13.4 09/04/2018 1211   HCT 28.7 (L) 01/02/2021 1049   HCT 41.1 09/04/2018 1211   PLT 219 01/02/2021 1049   PLT 190 09/04/2018 1211   MCV 78.2 (L) 01/02/2021 1049   MCV 71 (L) 09/04/2018 1211   MCH 23.7 (L) 01/02/2021 1049   MCHC 30.3 01/02/2021 1049   RDW 15.6 (H) 01/02/2021 1049   RDW 15.8 (H) 09/04/2018 1211   LYMPHSABS 1.3 12/30/2020 0136   MONOABS 0.8 12/30/2020  0136   EOSABS 0.1 12/30/2020 0136   BASOSABS 0.0 12/30/2020 0136     Assessment/Plan:  1.AKI on [id number] 2.6 in October 2021. Rapidly progressing kidney loss. Ultrasound with small kidneys. Possible renal artery stenosis based on kidney size. RIJ tunn catheter and LUE avGon 2/21 with vascular. First HD on 2/21 -Has been on HD per TTS schedule - Monitor for signs of recovery - seems guarded -Has been accepted to [facility]  2. Hypertension/volume-malignant hypertension on arrival with flash pulmonary edema intermittently. Blood pressure improved. Discontinued renal artery duplex as still not done and HTN controlled. Doing okay on current regimen  3. AnemiaCKD - s/p feraheme on Aranesp.  Hemoglobin slowly improving.  5.A. Fib-has been controlled here recently.could be causing some variable renal perfusion-cardiology has seen   6. Hyponatremia: Significantly improved with dialysis  7. Metabolic bone disease- intact PTH 169. on sevelamer. On renal diet. Improved phos.  We will stop binder  8. Disposition- awaitingword fromSNF([facility]) and has been accepted at [facility] and awaiting outpatient schedule
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
## 2021-01-04 NOTE — Significant Event (Signed)
Rapid Response Event Note   Reason for Call :  Per staff patient OOB to chair with therapy.  After he was sitting in the chair he felt dizzy and "not well".  BP 80s/60s  Staff assisted him back to bed with steady and he vomited and went unresponsive for a few moments.  Initial Focused Assessment:  Upon my arrival patient is lying in the bed fully alert and oriented.  He is feeling better.   He denies Chest pain or shortness of breath.    BP 131/72  SR 65  RR 16  O2 sat 95% on RA  Dr Sloan Leiter came to bedside as well  Interventions:  12 lead EKG done  Plan of Care:     Event Summary:   MD Notified: Ghimire Call Time: [place] Time: [id number] End Time: [place]  Raliegh Ip, RN

## 2021-01-05 DIAGNOSIS — I10 Essential (primary) hypertension: Secondary | ICD-10-CM | POA: Diagnosis not present

## 2021-01-05 DIAGNOSIS — N179 Acute kidney failure, unspecified: Secondary | ICD-10-CM | POA: Diagnosis not present

## 2021-01-05 LAB — CBC
HCT: 26.5 % — ABNORMAL LOW (ref 39.0–52.0)
Hemoglobin: 8.5 g/dL — ABNORMAL LOW (ref 13.0–17.0)
MCH: 24.9 pg — ABNORMAL LOW (ref 26.0–34.0)
MCHC: 32.1 g/dL (ref 30.0–36.0)
MCV: 77.5 fL — ABNORMAL LOW (ref 80.0–100.0)
Platelets: UNDETERMINED 10*3/uL (ref 150–400)
RBC: 3.42 MIL/uL — ABNORMAL LOW (ref 4.22–5.81)
RDW: 16.1 % — ABNORMAL HIGH (ref 11.5–15.5)
WBC: 7.6 10*3/uL (ref 4.0–10.5)
nRBC: 0 % (ref 0.0–0.2)

## 2021-01-05 LAB — GLUCOSE, CAPILLARY
Glucose-Capillary: 103 mg/dL — ABNORMAL HIGH (ref 70–99)
Glucose-Capillary: 204 mg/dL — ABNORMAL HIGH (ref 70–99)
Glucose-Capillary: 155 mg/dL — ABNORMAL HIGH (ref 70–99)

## 2021-01-05 MED ORDER — HEPARIN SODIUM (PORCINE) 5000 UNIT/ML IJ SOLN
5000.0000 [IU] | Freq: Three times a day (TID) | INTRAMUSCULAR | Status: DC
  Administered 2021-01-05 – 2021-01-08 (×10): 5000 [IU] via SUBCUTANEOUS
  Filled 2021-01-05 (×10): qty 1

## 2021-01-05 MED ORDER — DARBEPOETIN ALFA 100 MCG/0.5ML IJ SOSY
PREFILLED_SYRINGE | INTRAMUSCULAR | Status: AC
  Administered 2021-01-05: 100 ug via INTRAVENOUS
  Filled 2021-01-05: qty 0.5

## 2021-01-05 MED ORDER — ISOSORBIDE MONONITRATE ER 30 MG PO TB24
30.0000 mg | ORAL_TABLET | Freq: Every day | ORAL | Status: DC
  Administered 2021-01-06: 30 mg via ORAL
  Filled 2021-01-05 (×2): qty 1

## 2021-01-05 MED ORDER — HYDRALAZINE HCL 50 MG PO TABS
75.0000 mg | ORAL_TABLET | Freq: Three times a day (TID) | ORAL | Status: DC
  Administered 2021-01-05 – 2021-01-06 (×4): 75 mg via ORAL
  Filled 2021-01-05 (×4): qty 1

## 2021-01-05 MED ORDER — HEPARIN SODIUM (PORCINE) 1000 UNIT/ML IJ SOLN
INTRAMUSCULAR | Status: AC
  Administered 2021-01-05: 1000 [IU] via INTRAVENOUS
  Filled 2021-01-05: qty 4

## 2021-01-05 NOTE — Progress Notes (Signed)
Renal Navigator received information back from [facility] that patient has a secured MWF seat, however, Clinic Manager will contact Navigator tomorrow with seat time. Navigator asked Nephrologist/Dr. Joylene Grapes to order HD again tomorrow to transition patient from TTS to MWF and updated TOC CSW/N. Rayyan that she can proceed with discharge to [facility] anytime after patient has HD Wednesday and patient will start at outpatient clinic on Friday, 01/08/21.  Navigator will continue to follow through discharge.   Alphonzo Cruise, [place] Renal Navigator [phone]

## 2021-01-05 NOTE — Progress Notes (Signed)
Renal Navigator received update from [facility] CSW that patient has been offered a bed at [facility] and will require a MWF seat for outpatient HD. Navigator has sent message to [facility] to request a MWF seat for patient to accommodate discharge to SNF and will follow up with CSW as soon as possible.  Alphonzo Cruise,  Renal Navigator [phone]

## 2021-01-05 NOTE — Progress Notes (Addendum)
Patient ID: Ruben Camacho., male   DOB: 07-14-1956, 65 y.o.   MRN: [id number] S: No complaints.  Resting comfortably.  Dialysis today O:BP (!) 144/64 (BP Location: Right Arm)   Pulse 74   Temp 98.4 F (36.9 C) (Oral)   Resp 16   Ht '5\' 9"'$  (1.753 m)   Wt 68.4 kg   SpO2 96%   BMI 22.27 kg/m   Intake/Output Summary (Last 24 hours) at 01/05/2021 1045 Last data filed at 01/05/2021 0600 Gross per 24 hour  Intake 240 ml  Output 300 ml  Net -60 ml   Intake/Output: I/O last 3 completed shifts: In: 240 [P.O.:240] Out: 450 [Urine:450]  Intake/Output this shift:  No intake/output data recorded. Weight change:  Gen: NAD, lying in bed CVS: Normal rate Resp: Bilateral chest rise, no increased work of breathing Abd: +BS,soft, NT/ND Ext: no edema LUE AVG +T/B  Recent Labs  Lab 12/30/20 0136 12/31/20 0711 01/01/21 0228 01/02/21 1049 01/03/21 0342 01/05/21 0240  NA 136 135 134* 132* 135 134*  K 3.8 3.4* 3.6 3.4* 5.0 4.5  CL 98 97* 97* 95* 100 102  CO2 '24 23 25 24 24 '$ 20*  GLUCOSE 116* 99 117* 118* 159* 99  BUN 37* 56* 32* 55* 31* 84*  CREATININE 3.40* 5.27* 3.83* 5.38* 3.71* 6.55*  ALBUMIN  --  2.7* 2.8* 2.8* 2.8* 2.7*  CALCIUM 8.7* 8.8* 8.5* 8.8* 9.2 8.7*  PHOS  --  4.8* 2.9 4.5 3.4 3.9   Liver Function Tests: Recent Labs  Lab 01/02/21 1049 01/03/21 0342 01/05/21 0240  ALBUMIN 2.8* 2.8* 2.7*   No results for input(s): LIPASE, AMYLASE in the last 168 hours. No results for input(s): AMMONIA in the last 168 hours. CBC: Recent Labs  Lab 12/30/20 0136 01/02/21 1049 01/05/21 0240  WBC 6.4 9.3 7.6  NEUTROABS 4.2  --   --   HGB 8.6* 8.7* 8.5*  HCT 28.4* 28.7* 26.5*  MCV 78.2* 78.2* 77.5*  PLT 187 219 PLATELET CLUMPS NOTED ON SMEAR, UNABLE TO ESTIMATE   Cardiac Enzymes: No results for input(s): CKTOTAL, CKMB, CKMBINDEX, TROPONINI in the last 168 hours. CBG: Recent Labs  Lab 01/04/21 0809 01/04/21 1235 01/04/21 1752 01/04/21 2000 01/05/21 0736  GLUCAP 96 198* 142*  138* 103*    Iron Studies: No results for input(s): IRON, TIBC, TRANSFERRIN, FERRITIN in the last 72 hours. Studies/Results: No results found. Marland Kitchen atorvastatin  40 mg Oral q1800  . Chlorhexidine Gluconate Cloth  6 each Topical Q0600  . citalopram  10 mg Oral Daily  . darbepoetin (ARANESP) injection - DIALYSIS  100 mcg Intravenous Q Tue-HD  . feeding supplement (NEPRO CARB STEADY)  237 mL Oral TID BM  . hydrALAZINE  100 mg Oral Q8H  . influenza vac split quadrivalent PF  0.5 mL Intramuscular Tomorrow-1000  . insulin aspart  0-9 Units Subcutaneous TID WC  . isosorbide mononitrate  60 mg Oral Daily  . labetalol  200 mg Oral BID  . lactulose  20 g Oral Daily  . melatonin  5 mg Oral QHS  . multivitamin  1 tablet Oral QHS  . pantoprazole  40 mg Oral BID  . pneumococcal 23 valent vaccine  0.5 mL Intramuscular Tomorrow-1000  . polyethylene glycol  17 g Oral BID  . senna  2 tablet Oral QHS  . sodium chloride flush  3 mL Intravenous Q12H    BMET    Component Value Date/Time   NA 134 (L) 01/05/2021 0240   NA 136  08/06/2019 1400   K 4.5 01/05/2021 0240   CL 102 01/05/2021 0240   CO2 20 (L) 01/05/2021 0240   GLUCOSE 99 01/05/2021 0240   BUN 84 (H) 01/05/2021 0240   BUN 20 08/06/2019 1400   CREATININE 6.55 (H) 01/05/2021 0240   CREATININE 1.28 08/19/2015 0844   CALCIUM 8.7 (L) 01/05/2021 0240   GFRNONAA 9 (L) 01/05/2021 0240   GFRNONAA 76 12/01/2014 1037   GFRAA 33 (L) 08/06/2019 1400   GFRAA 88 12/01/2014 1037   CBC    Component Value Date/Time   WBC 7.6 01/05/2021 0240   RBC 3.42 (L) 01/05/2021 0240   HGB 8.5 (L) 01/05/2021 0240   HGB 13.4 09/04/2018 1211   HCT 26.5 (L) 01/05/2021 0240   HCT 41.1 09/04/2018 1211   PLT PLATELET CLUMPS NOTED ON SMEAR, UNABLE TO ESTIMATE 01/05/2021 0240   PLT 190 09/04/2018 1211   MCV 77.5 (L) 01/05/2021 0240   MCV 71 (L) 09/04/2018 1211   MCH 24.9 (L) 01/05/2021 0240   MCHC 32.1 01/05/2021 0240   RDW 16.1 (H) 01/05/2021 0240   RDW 15.8  (H) 09/04/2018 1211   LYMPHSABS 1.3 12/30/2020 0136   MONOABS 0.8 12/30/2020 0136   EOSABS 0.1 12/30/2020 0136   BASOSABS 0.0 12/30/2020 0136     Assessment/Plan:  1.AKI on [id number] 2.6 in October 2021. Rapidly progressing kidney loss. Ultrasound with small kidneys. Possible renal artery stenosis based on kidney size. RIJ tunn catheter and LUE avGon 2/21 with vascular. First HD on 2/21 -Has been on HD per TTS schedule - Monitor for signs of recovery - seems guarded -Has been accepted to [facility]; per SNF will need MWF chair.  Awaiting to hear from [place] see if he can switch.  We will adjust dialysis to MWF schedule with plans to do dialysis again on Friday after today  2. Hypertension/volume-malignant hypertension on arrival with flash pulmonary edema intermittently. Blood pressure improved. Discontinued renal artery duplex as still not done and HTN controlled. Doing okay on current regimen  3. AnemiaCKD - s/p feraheme on Aranesp.  Hemoglobin slowly improving.  5.A. Fib-has been controlled here recently.could be causing some variable renal perfusion-cardiology has seen   6. Hyponatremia: Significantly improved with dialysis  7. Metabolic bone disease- intact PTH 169. on sevelamer. On renal diet. Improved phos.  We will stop binder  8. Disposition-likely has SNF.  Ensuring he has a MWF chair for dialysis.  Likely [facility]

## 2021-01-05 NOTE — Progress Notes (Signed)
PROGRESS NOTE        PATIENT DETAILS Name: Ruben Camacho. Age: 65 y.o. Sex: male Date of Birth: [birth year] Admit Date: 12/14/2020 Admitting Physician Lequita Halt, MD [id number], Ines Bloomer, MD  Brief Narrative: Patient is a 65 y.o. male with a history of CKD stage IV, DM-2, HLD presented with shortness of breath-he was found to have pulmonary edema in the setting of worsening renal function.  Hospital course complicated by possible upper GI bleeding and blood loss anemia.  Significant events: 2/14>> admit for worsening SOB-pulmonary edema in the setting of AKI on CKD stage IV. 2/18>> GI consult for coffee-ground emesis-patient refused EGD. 2/21>> started on HD  Significant studies: 2/14>> Echo: EF 40%, global hypokinesis, grade 2 diastolic dysfunction, RVSP 26.6 2/14>> chest x-ray: Bilateral airspace disease 2/15>> chest x-ray: Bilateral pulmonary infiltrate/edema 2/15>> renal ultrasound: Chronic medical renal disease-no hydronephrosis 2/20>> Echo: EF 65-70%, severe LVH, grade 1 diastolic dysfunction  Antimicrobial therapy: None  Microbiology data: None  Procedures : 2/21>> TDC and new left upper arm AVG  Consults: Nephrology Vascular surgery GI Cardiology  DVT Prophylaxis : SCD's given UGI bleed  Subjective: Lying comfortably in bed-no major issues.  Very briefly syncopized yesterday when working with PT-was hypotensive.  Assessment/Plan: AKI on CKD stage IV: AKI likely hemodynamically mediated-on HD per nephrology.  No signs of renal recovery as of yet.  Continue to monitor.    Acute on chronic diastolic heart failure in the setting of worsening renal function: Volume status stable-diuresis with HD.  Acute hypoxic respiratory failure: Due to pulmonary edema-resolved  Upper GI bleeding: Resolved-GI consulted-patient refused EGD.  Hemoglobin stable.  Continue PPI twice daily.  Chronic microcytic anemia: Continue to follow  closely-we will need outpatient endoscopic evaluation.  Left knee pain: Due to gout flare-resolved after a short course of steroids.   Hyponatremia: Resolved  Anxiety/depression: Stable continue Celexa  Atrial tachycardia: Stable-continue labetalol-cardiology has signed off.    Minimally elevated troponins: Demand ischemia-cardiology planning on outpatient stress testing.  HTN: BP controlled-due to syncope due to orthostatic hypotension-change Imdur to 30 mg daily, hydralazine to 75 mg 3 times daily, continue current dosing of labetalol.  Follow and adjust.   Syncope: Occurred on 3/7-when patient was ambulated off the bed by PT-was hypotensive-very brief.  Have adjusted antihypertensive regimen.  Overnight was unremarkable.  Follow.  DM-2 (A1c 7.0 on 2/15): Continue SSI  Recent Labs    01/04/21 1752 01/04/21 2000 01/05/21 0736  GLUCAP 142* 138* 103*   Deconditioning/debility: PT recommending SNF.  Constipation: S/p tapwater enema-now on bowel regimen with MiraLAX/lactulose/ Senokot  Nutrition Problem: Nutrition Problem: Severe Malnutrition Etiology: acute illness (SOB r/t CHF causing poor appetite) Signs/Symptoms: mild fat depletion,mild muscle depletion,moderate muscle depletion,moderate fat depletion Interventions: Ensure Enlive (each supplement provides 350kcal and 20 grams of protein)   Diet: Diet Order            Diet renal/carb modified with fluid restriction Fluid restriction: 1200 mL Fluid; Room service appropriate? No; Fluid consistency: Thin  Diet effective now                  Code Status: Full code  Family Communication: Sister-Mary-[phone]-updated over the phone on 3/4  Disposition Plan: Status is: Inpatient  Remains inpatient appropriate because:Inpatient level of care appropriate due to severity of illness   Dispo: The patient is from:  Home              Anticipated d/c is to: SNF              Patient currently is not medically stable to  d/c.   Difficult to place patient No   Barriers to Discharge: AKI-awaiting clipping/SNF placement  Antimicrobial agents: Anti-infectives (From admission, onward)   Start     Dose/Rate Route Frequency Ordered Stop   12/18/20 0000  ceFAZolin (ANCEF) IVPB 1 g/50 mL premix  Status:  Discontinued       Note to Pharmacy: Send with pt to OR   1 g 100 mL/hr over 30 Minutes Intravenous On call 12/17/20 1511 12/17/20 1513   12/18/20 0000  ceFAZolin (ANCEF) IVPB 2g/100 mL premix       Note to Pharmacy: Send with pt to OR   2 g 200 mL/hr over 30 Minutes Intravenous On call 12/17/20 1513 12/19/20 0000       Time spent: 15- minutes-Greater than 50% of this time was spent in counseling, explanation of diagnosis, planning of further management, and coordination of care.  MEDICATIONS: Scheduled Meds: . atorvastatin  40 mg Oral q1800  . Chlorhexidine Gluconate Cloth  6 each Topical Q0600  . citalopram  10 mg Oral Daily  . darbepoetin (ARANESP) injection - DIALYSIS  100 mcg Intravenous Q Tue-HD  . feeding supplement (NEPRO CARB STEADY)  237 mL Oral TID BM  . hydrALAZINE  100 mg Oral Q8H  . influenza vac split quadrivalent PF  0.5 mL Intramuscular Tomorrow-1000  . insulin aspart  0-9 Units Subcutaneous TID WC  . isosorbide mononitrate  60 mg Oral Daily  . labetalol  200 mg Oral BID  . lactulose  20 g Oral Daily  . melatonin  5 mg Oral QHS  . multivitamin  1 tablet Oral QHS  . pantoprazole  40 mg Oral BID  . pneumococcal 23 valent vaccine  0.5 mL Intramuscular Tomorrow-1000  . polyethylene glycol  17 g Oral BID  . senna  2 tablet Oral QHS  . sodium chloride flush  3 mL Intravenous Q12H   Continuous Infusions: . sodium chloride     PRN Meds:.sodium chloride, acetaminophen, benzonatate, bisacodyl, chlorpheniramine-HYDROcodone, guaiFENesin-dextromethorphan, heparin sodium (porcine), levalbuterol, LORazepam, ondansetron (ZOFRAN) IV, oxyCODONE-acetaminophen, sodium chloride flush   PHYSICAL  EXAM: Vital signs: Vitals:   01/04/21 2117 01/05/21 0500 01/05/21 0559 01/05/21 0908  BP: (!) 145/66  131/70 (!) 144/64  Pulse: 73   74  Resp:   16   Temp:   98.4 F (36.9 C)   TempSrc:   Oral   SpO2:   96%   Weight:  68.4 kg    Height:       Filed Weights   01/02/21 1730 01/03/21 0409 01/05/21 0500  Weight: 66.8 kg 69 kg 68.4 kg   Body mass index is 22.27 kg/m.   Gen Exam:Alert awake-not in any distress HEENT:atraumatic, normocephalic Chest: B/L clear to auscultation anteriorly CVS:S1S2 regular Abdomen:soft non tender, non distended Extremities: Left knee tenderness has essentially resolved. Neurology: Non focal Skin: no rash  I have personally reviewed following labs and imaging studies  LABORATORY DATA: CBC: Recent Labs  Lab 12/30/20 0136 01/02/21 1049 01/05/21 0240  WBC 6.4 9.3 7.6  NEUTROABS 4.2  --   --   HGB 8.6* 8.7* 8.5*  HCT 28.4* 28.7* 26.5*  MCV 78.2* 78.2* 77.5*  PLT 187 219 PLATELET CLUMPS NOTED ON SMEAR, UNABLE TO ESTIMATE    Basic  Metabolic Panel: Recent Labs  Lab 12/31/20 0711 01/01/21 0228 01/02/21 1049 01/03/21 0342 01/05/21 0240  NA 135 134* 132* 135 134*  K 3.4* 3.6 3.4* 5.0 4.5  CL 97* 97* 95* 100 102  CO2 '23 25 24 24 '$ 20*  GLUCOSE 99 117* 118* 159* 99  BUN 56* 32* 55* 31* 84*  CREATININE 5.27* 3.83* 5.38* 3.71* 6.55*  CALCIUM 8.8* 8.5* 8.8* 9.2 8.7*  PHOS 4.8* 2.9 4.5 3.4 3.9    GFR: Estimated Creatinine Clearance: 10.9 mL/min (A) (by C-G formula based on SCr of 6.55 mg/dL (H)).  Liver Function Tests: Recent Labs  Lab 12/31/20 0711 01/01/21 0228 01/02/21 1049 01/03/21 0342 01/05/21 0240  ALBUMIN 2.7* 2.8* 2.8* 2.8* 2.7*   No results for input(s): LIPASE, AMYLASE in the last 168 hours. No results for input(s): AMMONIA in the last 168 hours.  Coagulation Profile: No results for input(s): INR, PROTIME in the last 168 hours.  Cardiac Enzymes: No results for input(s): CKTOTAL, CKMB, CKMBINDEX, TROPONINI in the last  168 hours.  BNP (last 3 results) No results for input(s): PROBNP in the last 8760 hours.  Lipid Profile: No results for input(s): CHOL, HDL, LDLCALC, TRIG, CHOLHDL, LDLDIRECT in the last 72 hours.  Thyroid Function Tests: No results for input(s): TSH, T4TOTAL, FREET4, T3FREE, THYROIDAB in the last 72 hours.  Anemia Panel: No results for input(s): VITAMINB12, FOLATE, FERRITIN, TIBC, IRON, RETICCTPCT in the last 72 hours.  Urine analysis:    Component Value Date/Time   COLORURINE STRAW (A) 12/15/2020 2334   APPEARANCEUR CLEAR 12/15/2020 2334   LABSPEC 1.009 12/15/2020 2334   PHURINE 6.0 12/15/2020 2334   GLUCOSEU 50 (A) 12/15/2020 2334   HGBUR NEGATIVE 12/15/2020 2334   BILIRUBINUR NEGATIVE 12/15/2020 2334   BILIRUBINUR negative 09/04/2018 1030   KETONESUR NEGATIVE 12/15/2020 2334   PROTEINUR 100 (A) 12/15/2020 2334   UROBILINOGEN 0.2 09/04/2018 1030   UROBILINOGEN 1.0 12/08/2014 1035   NITRITE NEGATIVE 12/15/2020 2334   LEUKOCYTESUR NEGATIVE 12/15/2020 2334    Sepsis Labs: Lactic Acid, Venous    Component Value Date/Time   LATICACIDVEN 1.0 12/18/2020 0003    MICROBIOLOGY: No results found for this or any previous visit (from the past 240 hour(s)).  RADIOLOGY STUDIES/RESULTS: No results found.   LOS: 22 days   Oren Binet, MD  [facility]    To contact the attending provider between 7A-7P or the covering provider during after hours 7P-7A, please log into the web site [email or web address] and access using universal [place] password for that web site. If you do not have the password, please call the hospital operator.  01/05/2021, 11:27 AM

## 2021-01-06 DIAGNOSIS — I10 Essential (primary) hypertension: Secondary | ICD-10-CM | POA: Diagnosis not present

## 2021-01-06 DIAGNOSIS — N179 Acute kidney failure, unspecified: Secondary | ICD-10-CM | POA: Diagnosis not present

## 2021-01-06 LAB — RENAL FUNCTION PANEL
Albumin: 2.7 g/dL — ABNORMAL LOW (ref 3.5–5.0)
Albumin: 3 g/dL — ABNORMAL LOW (ref 3.5–5.0)
Anion gap: 12 (ref 5–15)
Anion gap: 11 (ref 5–15)
BUN: 84 mg/dL — ABNORMAL HIGH (ref 8–23)
BUN: 63 mg/dL — ABNORMAL HIGH (ref 8–23)
CO2: 20 mmol/L — ABNORMAL LOW (ref 22–32)
CO2: 26 mmol/L (ref 22–32)
Calcium: 8.7 mg/dL — ABNORMAL LOW (ref 8.9–10.3)
Calcium: 8.5 mg/dL — ABNORMAL LOW (ref 8.9–10.3)
Chloride: 102 mmol/L (ref 98–111)
Chloride: 98 mmol/L (ref 98–111)
Creatinine, Ser: 6.55 mg/dL — ABNORMAL HIGH (ref 0.61–1.24)
Creatinine, Ser: 5.51 mg/dL — ABNORMAL HIGH (ref 0.61–1.24)
GFR, Estimated: 9 mL/min — ABNORMAL LOW (ref >60)
GFR, Estimated: 11 mL/min — ABNORMAL LOW (ref >60)
Glucose, Bld: 99 mg/dL (ref 70–99)
Glucose, Bld: 87 mg/dL (ref 70–99)
Phosphorus: 3.9 mg/dL (ref 2.5–4.6)
Phosphorus: 4.4 mg/dL (ref 2.5–4.6)
Potassium: 4.5 mmol/L (ref 3.5–5.1)
Potassium: 3.9 mmol/L (ref 3.5–5.1)
Sodium: 134 mmol/L — ABNORMAL LOW (ref 135–145)
Sodium: 135 mmol/L (ref 135–145)

## 2021-01-06 LAB — CBC
HCT: 31.2 % — ABNORMAL LOW (ref 39.0–52.0)
Hemoglobin: 9.4 g/dL — ABNORMAL LOW (ref 13.0–17.0)
MCH: 24.1 pg — ABNORMAL LOW (ref 26.0–34.0)
MCHC: 30.1 g/dL (ref 30.0–36.0)
MCV: 80 fL (ref 80.0–100.0)
Platelets: 232 10*3/uL (ref 150–400)
RBC: 3.9 MIL/uL — ABNORMAL LOW (ref 4.22–5.81)
RDW: 16.4 % — ABNORMAL HIGH (ref 11.5–15.5)
WBC: 7.3 10*3/uL (ref 4.0–10.5)
nRBC: 0 % (ref 0.0–0.2)

## 2021-01-06 LAB — GLUCOSE, CAPILLARY
Glucose-Capillary: 109 mg/dL — ABNORMAL HIGH (ref 70–99)
Glucose-Capillary: 151 mg/dL — ABNORMAL HIGH (ref 70–99)
Glucose-Capillary: 74 mg/dL (ref 70–99)
Glucose-Capillary: 132 mg/dL — ABNORMAL HIGH (ref 70–99)

## 2021-01-06 MED ORDER — DARBEPOETIN ALFA 100 MCG/0.5ML IJ SOSY: 100.0000 ug | PREFILLED_SYRINGE | INTRAMUSCULAR | Status: DC

## 2021-01-06 MED ORDER — HEPARIN SODIUM (PORCINE) 1000 UNIT/ML IJ SOLN
INTRAMUSCULAR | Status: AC
  Filled 2021-01-06: qty 4

## 2021-01-06 NOTE — Progress Notes (Signed)
Patient ID: Ruben Camacho., male   DOB: 10-Jun-1956, 65 y.o.   MRN: [id number] S: Patient resting today with no complaints.  Dialysis yesterday with no issues O:BP 124/67 (BP Location: Right Arm)   Pulse 66   Temp 98.5 F (36.9 C) (Oral)   Resp 19   Ht '5\' 9"'$  (1.753 m)   Wt 66.9 kg   SpO2 98%   BMI 21.78 kg/m   Intake/Output Summary (Last 24 hours) at 01/06/2021 1253 Last data filed at 01/06/2021 1036 Gross per 24 hour  Intake 243 ml  Output 2500 ml  Net -2257 ml   Intake/Output: I/O last 3 completed shifts: In: 360 [P.O.:360] Out: 2800 [Urine:300; Other:2500]  Intake/Output this shift:  Total I/O In: 123 [P.O.:120; I.V.:3] Out: -  Weight change: 0 kg Gen: NAD, lying in bed CVS: Normal rate Resp: Bilateral chest rise, no increased work of breathing Abd: +BS,soft, NT/ND Ext: no edema LUE AVG +T/B  Recent Labs  Lab 12/31/20 0711 01/01/21 0228 01/02/21 1049 01/03/21 0342 01/05/21 0240  NA 135 134* 132* 135 134*  K 3.4* 3.6 3.4* 5.0 4.5  CL 97* 97* 95* 100 102  CO2 '23 25 24 24 '$ 20*  GLUCOSE 99 117* 118* 159* 99  BUN 56* 32* 55* 31* 84*  CREATININE 5.27* 3.83* 5.38* 3.71* 6.55*  ALBUMIN 2.7* 2.8* 2.8* 2.8* 2.7*  CALCIUM 8.8* 8.5* 8.8* 9.2 8.7*  PHOS 4.8* 2.9 4.5 3.4 3.9   Liver Function Tests: Recent Labs  Lab 01/02/21 1049 01/03/21 0342 01/05/21 0240  ALBUMIN 2.8* 2.8* 2.7*   No results for input(s): LIPASE, AMYLASE in the last 168 hours. No results for input(s): AMMONIA in the last 168 hours. CBC: Recent Labs  Lab 01/02/21 1049 01/05/21 0240  WBC 9.3 7.6  HGB 8.7* 8.5*  HCT 28.7* 26.5*  MCV 78.2* 77.5*  PLT 219 PLATELET CLUMPS NOTED ON SMEAR, UNABLE TO ESTIMATE   Cardiac Enzymes: No results for input(s): CKTOTAL, CKMB, CKMBINDEX, TROPONINI in the last 168 hours. CBG: Recent Labs  Lab 01/05/21 0736 01/05/21 1657 01/05/21 2030 01/06/21 0700 01/06/21 1152  GLUCAP 103* 204* 155* 109* 151*    Iron Studies: No results for input(s): IRON, TIBC,  TRANSFERRIN, FERRITIN in the last 72 hours. Studies/Results: No results found. Marland Kitchen atorvastatin  40 mg Oral q1800  . Chlorhexidine Gluconate Cloth  6 each Topical Q0600  . citalopram  10 mg Oral Daily  . darbepoetin (ARANESP) injection - DIALYSIS  100 mcg Intravenous Q Tue-HD  . feeding supplement (NEPRO CARB STEADY)  237 mL Oral TID BM  . heparin injection (subcutaneous)  5,000 Units Subcutaneous Q8H  . hydrALAZINE  75 mg Oral Q8H  . influenza vac split quadrivalent PF  0.5 mL Intramuscular Tomorrow-1000  . insulin aspart  0-9 Units Subcutaneous TID WC  . isosorbide mononitrate  30 mg Oral Daily  . labetalol  200 mg Oral BID  . lactulose  20 g Oral Daily  . melatonin  5 mg Oral QHS  . multivitamin  1 tablet Oral QHS  . pantoprazole  40 mg Oral BID  . pneumococcal 23 valent vaccine  0.5 mL Intramuscular Tomorrow-1000  . polyethylene glycol  17 g Oral BID  . senna  2 tablet Oral QHS  . sodium chloride flush  3 mL Intravenous Q12H    BMET    Component Value Date/Time   NA 134 (L) 01/05/2021 0240   NA 136 08/06/2019 1400   K 4.5 01/05/2021 0240   CL 102  01/05/2021 0240   CO2 20 (L) 01/05/2021 0240   GLUCOSE 99 01/05/2021 0240   BUN 84 (H) 01/05/2021 0240   BUN 20 08/06/2019 1400   CREATININE 6.55 (H) 01/05/2021 0240   CREATININE 1.28 08/19/2015 0844   CALCIUM 8.7 (L) 01/05/2021 0240   GFRNONAA 9 (L) 01/05/2021 0240   GFRNONAA 76 12/01/2014 1037   GFRAA 33 (L) 08/06/2019 1400   GFRAA 88 12/01/2014 1037   CBC    Component Value Date/Time   WBC 7.6 01/05/2021 0240   RBC 3.42 (L) 01/05/2021 0240   HGB 8.5 (L) 01/05/2021 0240   HGB 13.4 09/04/2018 1211   HCT 26.5 (L) 01/05/2021 0240   HCT 41.1 09/04/2018 1211   PLT PLATELET CLUMPS NOTED ON SMEAR, UNABLE TO ESTIMATE 01/05/2021 0240   PLT 190 09/04/2018 1211   MCV 77.5 (L) 01/05/2021 0240   MCV 71 (L) 09/04/2018 1211   MCH 24.9 (L) 01/05/2021 0240   MCHC 32.1 01/05/2021 0240   RDW 16.1 (H) 01/05/2021 0240   RDW 15.8 (H)  09/04/2018 1211   LYMPHSABS 1.3 12/30/2020 0136   MONOABS 0.8 12/30/2020 0136   EOSABS 0.1 12/30/2020 0136   BASOSABS 0.0 12/30/2020 0136     Assessment/Plan:  1.AKI on [id number] 2.6 in October 2021. Rapidly progressing kidney loss. Ultrasound with small kidneys. Possible renal artery stenosis based on kidney size. RIJ tunn catheter and LUE avGon 2/21 with vascular. First HD on 2/21 -Has been on HD per TTS schedule - Monitor for signs of recovery - seems guarded -Has been accepted to [facility]; undergoing 2 hours of dialysis today so we can switch schedule to MWF.  Plans to start outpatient dialysis on Friday  2. Hypertension/volume-malignant hypertension on arrival with flash pulmonary edema intermittently. Blood pressure improved. Discontinued renal artery duplex as still not done and HTN controlled. Doing okay on current regimen  3. AnemiaCKD - s/p feraheme on Aranesp.  Hemoglobin slowly improving.  5.A. Fib-has been controlled here recently.could be causing some variable renal perfusion-cardiology has seen   6. Hyponatremia: Significantly improved with dialysis  7. Metabolic bone disease- intact PTH 169. on sevelamer. On renal diet. Improved phos.  We will stop binder  8. Disposition-has SNF and dialysis chair outpatient.  Planning to start outpatient dialysis on Friday

## 2021-01-06 NOTE — Progress Notes (Signed)
Patient off unit via bed by transport for HD. Patient alert, no distress noted. No complaints at this time.

## 2021-01-06 NOTE — Progress Notes (Signed)
PROGRESS NOTE        PATIENT DETAILS Name: Ruben Camacho. Age: 65 y.o. Sex: male Date of Birth: [birth year] Admit Date: 12/14/2020 Admitting Physician Lequita Halt, MD [id number], Ines Bloomer, MD  Brief Narrative: Patient is a 66 y.o. male with a history of CKD stage IV, DM-2, HLD presented with shortness of breath-he was found to have pulmonary edema in the setting of worsening renal function.  Hospital course complicated by possible upper GI bleeding and blood loss anemia.  Significant events: 2/14>> admit for worsening SOB-pulmonary edema in the setting of AKI on CKD stage IV. 2/18>> GI consult for coffee-ground emesis-patient refused EGD. 2/21>> started on HD  Significant studies: 2/14>> Echo: EF 40%, global hypokinesis, grade 2 diastolic dysfunction, RVSP 26.6 2/14>> chest x-ray: Bilateral airspace disease 2/15>> chest x-ray: Bilateral pulmonary infiltrate/edema 2/15>> renal ultrasound: Chronic medical renal disease-no hydronephrosis 2/20>> Echo: EF 65-70%, severe LVH, grade 1 diastolic dysfunction  Antimicrobial therapy: None  Microbiology data: None  Procedures : 2/21>> TDC and new left upper arm AVG  Consults: Nephrology Vascular surgery GI Cardiology  DVT Prophylaxis : SCD's given UGI bleed  Subjective: Lying comfortably in bed-no major issues overnight.  Assessment/Plan: AKI on CKD stage IV: AKI likely hemodynamically mediated-on HD per nephrology.  No signs of renal recovery as of yet.  Continue to monitor.    Acute on chronic diastolic heart failure in the setting of worsening renal function: Volume status stable-diuresis with HD.  Acute hypoxic respiratory failure: Due to pulmonary edema-resolved  Upper GI bleeding: Resolved-GI consulted-patient refused EGD.  Hemoglobin stable.  Continue PPI twice daily.  Chronic microcytic anemia: Continue to follow closely-we will need outpatient endoscopic evaluation.  Left knee  pain: Due to gout flare-resolved after a short course of steroids.   Hyponatremia: Resolved  Anxiety/depression: Stable continue Celexa  Atrial tachycardia: Stable-continue labetalol-cardiology has signed off.    Minimally elevated troponins: Demand ischemia-cardiology planning on outpatient stress testing.  HTN: BP controlled-due to syncope due to orthostatic hypotension-change Imdur to 30 mg daily, hydralazine to 75 mg 3 times daily, continue current dosing of labetalol.  Follow and adjust.   Syncope: Occurred on 3/7-when patient was ambulated off the bed by PT-was hypotensive-very brief.  Have adjusted antihypertensive regimen.  Overnight was unremarkable.  Follow.  DM-2 (A1c 7.0 on 2/15): Continue SSI  Recent Labs    01/05/21 2030 01/06/21 0700 01/06/21 1152  GLUCAP 155* 109* 151*   Deconditioning/debility: PT recommending SNF.  Constipation: S/p tapwater enema-now on bowel regimen with MiraLAX/lactulose/ Senokot  Nutrition Problem: Nutrition Problem: Severe Malnutrition Etiology: acute illness (SOB r/t CHF causing poor appetite) Signs/Symptoms: mild fat depletion,mild muscle depletion,moderate muscle depletion,moderate fat depletion Interventions: Ensure Enlive (each supplement provides 350kcal and 20 grams of protein)   Diet: Diet Order            Diet renal/carb modified with fluid restriction Fluid restriction: 1200 mL Fluid; Room service appropriate? No; Fluid consistency: Thin  Diet effective now                  Code Status: Full code  Family Communication: Sister-Mary-[phone]-updated over the phone on 3/4  Disposition Plan: Status is: Inpatient  Remains inpatient appropriate because:Inpatient level of care appropriate due to severity of illness   Dispo: The patient is from: Home  Anticipated d/c is to: SNF              Patient currently is  medically stable to d/c.   Difficult to place patient No   Barriers to Discharge:  AKI-awaiting clipping/SNF placement  Antimicrobial agents: Anti-infectives (From admission, onward)   Start     Dose/Rate Route Frequency Ordered Stop   12/18/20 0000  ceFAZolin (ANCEF) IVPB 1 g/50 mL premix  Status:  Discontinued       Note to Pharmacy: Send with pt to OR   1 g 100 mL/hr over 30 Minutes Intravenous On call 12/17/20 1511 12/17/20 1513   12/18/20 0000  ceFAZolin (ANCEF) IVPB 2g/100 mL premix       Note to Pharmacy: Send with pt to OR   2 g 200 mL/hr over 30 Minutes Intravenous On call 12/17/20 1513 12/19/20 0000       Time spent: 15- minutes-Greater than 50% of this time was spent in counseling, explanation of diagnosis, planning of further management, and coordination of care.  MEDICATIONS: Scheduled Meds: . atorvastatin  40 mg Oral q1800  . Chlorhexidine Gluconate Cloth  6 each Topical Q0600  . citalopram  10 mg Oral Daily  . darbepoetin (ARANESP) injection - DIALYSIS  100 mcg Intravenous Q Tue-HD  . feeding supplement (NEPRO CARB STEADY)  237 mL Oral TID BM  . heparin injection (subcutaneous)  5,000 Units Subcutaneous Q8H  . hydrALAZINE  75 mg Oral Q8H  . influenza vac split quadrivalent PF  0.5 mL Intramuscular Tomorrow-1000  . insulin aspart  0-9 Units Subcutaneous TID WC  . isosorbide mononitrate  30 mg Oral Daily  . labetalol  200 mg Oral BID  . lactulose  20 g Oral Daily  . melatonin  5 mg Oral QHS  . multivitamin  1 tablet Oral QHS  . pantoprazole  40 mg Oral BID  . pneumococcal 23 valent vaccine  0.5 mL Intramuscular Tomorrow-1000  . polyethylene glycol  17 g Oral BID  . senna  2 tablet Oral QHS  . sodium chloride flush  3 mL Intravenous Q12H   Continuous Infusions: . sodium chloride     PRN Meds:.sodium chloride, acetaminophen, benzonatate, bisacodyl, chlorpheniramine-HYDROcodone, guaiFENesin-dextromethorphan, heparin sodium (porcine), levalbuterol, LORazepam, ondansetron (ZOFRAN) IV, oxyCODONE-acetaminophen, sodium chloride flush   PHYSICAL  EXAM: Vital signs: Vitals:   01/05/21 1455 01/05/21 1637 01/05/21 2028 01/06/21 0459  BP: 118/64 126/85 134/64 124/67  Pulse: 68  72 66  Resp: '18  20 19  '$ Temp: 98.3 F (36.8 C)  99 F (37.2 C) 98.5 F (36.9 C)  TempSrc: Oral  Oral Oral  SpO2: 95%  96% 98%  Weight:      Height:       Filed Weights   01/05/21 0500 01/05/21 1115 01/05/21 1426  Weight: 68.4 kg 68.4 kg 66.9 kg   Body mass index is 21.78 kg/m.   Gen Exam:Alert awake-not in any distress HEENT:atraumatic, normocephalic Chest: B/L clear to auscultation anteriorly CVS:S1S2 regular Abdomen:soft non tender, non distended Extremities: Left knee tenderness has essentially resolved. Neurology: Non focal Skin: no rash  I have personally reviewed following labs and imaging studies  LABORATORY DATA: CBC: Recent Labs  Lab 01/02/21 1049 01/05/21 0240  WBC 9.3 7.6  HGB 8.7* 8.5*  HCT 28.7* 26.5*  MCV 78.2* 77.5*  PLT 219 PLATELET CLUMPS NOTED ON SMEAR, UNABLE TO ESTIMATE    Basic Metabolic Panel: Recent Labs  Lab 12/31/20 0711 01/01/21 0228 01/02/21 1049 01/03/21 0342 01/05/21 0240  NA 135 134* 132* 135 134*  K 3.4* 3.6 3.4* 5.0 4.5  CL 97* 97* 95* 100 102  CO2 '23 25 24 24 '$ 20*  GLUCOSE 99 117* 118* 159* 99  BUN 56* 32* 55* 31* 84*  CREATININE 5.27* 3.83* 5.38* 3.71* 6.55*  CALCIUM 8.8* 8.5* 8.8* 9.2 8.7*  PHOS 4.8* 2.9 4.5 3.4 3.9    GFR: Estimated Creatinine Clearance: 10.6 mL/min (A) (by C-G formula based on SCr of 6.55 mg/dL (H)).  Liver Function Tests: Recent Labs  Lab 12/31/20 0711 01/01/21 0228 01/02/21 1049 01/03/21 0342 01/05/21 0240  ALBUMIN 2.7* 2.8* 2.8* 2.8* 2.7*   No results for input(s): LIPASE, AMYLASE in the last 168 hours. No results for input(s): AMMONIA in the last 168 hours.  Coagulation Profile: No results for input(s): INR, PROTIME in the last 168 hours.  Cardiac Enzymes: No results for input(s): CKTOTAL, CKMB, CKMBINDEX, TROPONINI in the last 168 hours.  BNP  (last 3 results) No results for input(s): PROBNP in the last 8760 hours.  Lipid Profile: No results for input(s): CHOL, HDL, LDLCALC, TRIG, CHOLHDL, LDLDIRECT in the last 72 hours.  Thyroid Function Tests: No results for input(s): TSH, T4TOTAL, FREET4, T3FREE, THYROIDAB in the last 72 hours.  Anemia Panel: No results for input(s): VITAMINB12, FOLATE, FERRITIN, TIBC, IRON, RETICCTPCT in the last 72 hours.  Urine analysis:    Component Value Date/Time   COLORURINE STRAW (A) 12/15/2020 2334   APPEARANCEUR CLEAR 12/15/2020 2334   LABSPEC 1.009 12/15/2020 2334   PHURINE 6.0 12/15/2020 2334   GLUCOSEU 50 (A) 12/15/2020 2334   HGBUR NEGATIVE 12/15/2020 2334   BILIRUBINUR NEGATIVE 12/15/2020 2334   BILIRUBINUR negative 09/04/2018 1030   KETONESUR NEGATIVE 12/15/2020 2334   PROTEINUR 100 (A) 12/15/2020 2334   UROBILINOGEN 0.2 09/04/2018 1030   UROBILINOGEN 1.0 12/08/2014 1035   NITRITE NEGATIVE 12/15/2020 2334   LEUKOCYTESUR NEGATIVE 12/15/2020 2334    Sepsis Labs: Lactic Acid, Venous    Component Value Date/Time   LATICACIDVEN 1.0 12/18/2020 0003    MICROBIOLOGY: No results found for this or any previous visit (from the past 240 hour(s)).  RADIOLOGY STUDIES/RESULTS: No results found.   LOS: 23 days   Oren Binet, MD  [facility]    To contact the attending provider between 7A-7P or the covering provider during after hours 7P-7A, please log into the web site [email or web address] and access using universal [place] password for that web site. If you do not have the password, please call the hospital operator.  01/06/2021, 12:15 PM
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
## 2021-01-06 NOTE — Progress Notes (Signed)
Physical Therapy Treatment Patient Details Name: Ruben Camacho. MRN: [id number] DOB: [birth year] Today's Date: 01/06/2021    History of Present Illness Pt is a 65 y.o. male admitted 12/14/20 with SOB; workup for acute on chronic CHF, hypertensive urgency, AKI on CKD. Pt with coffee ground emesis; GI consult, but pt declined EGD. S/p tunneled RIJ HD cath and LUE AVG placement 2/21; HD initiated 2/21. PMH includes HTN, CKD, DM, CVA, gout.    PT Comments    Pt very alert on entry agreeable to work with therapy, however states he is depressed because he has gotten so weak he can not walk around. Pt was working out regularly about a year ago before his health decline. Inquires of therapist how he can regain his strength. Pt BP is more stable today with mobility and pt with no reports of dizziness throughout session, however pt becomes lethargic in comparison to the wide awake he was before mobilizing. Pt also describes "not being in touch" with his body sometimes laying in the bed and wonders if it might be his new medications, encourage him to speak with his physicians. Pt is supervision for bed mobility, min guard for transfers and min A for stepping to recliner. Once in recliner pt instructed in LE exercises to improve strength, encouraged pt to perform once an hour. D/c plans remain appropriate at this time. PT will continue to follow acutely.       Follow Up Recommendations  SNF;Supervision for mobility/OOB     Equipment Recommendations  Rolling walker with 5" wheels;3in1 (PT)    Recommendations for Other Services       Precautions / Restrictions Precautions Precautions: Fall Precaution Comments: no dizziness today, although has increased lethargy after movement Restrictions Weight Bearing Restrictions: No    Mobility  Bed Mobility Overal bed mobility: Needs Assistance Bed Mobility: Supine to Sit;Sit to Supine     Supine to sit: Supervision;HOB elevated     General bed  mobility comments: supervision for safety, pt able to utilize bed rail to help pull himself to upright and bring hips to EoB    Transfers Overall transfer level: Needs assistance Equipment used: None Transfers: Sit to/from Stand Sit to Stand: Min guard         General transfer comment: min guard for safety, good power up, however utilizes bed on back of legs to assist in steadying, once steadied cued to step away from bed and pt able to static stand  Ambulation/Gait Ambulation/Gait assistance: Min assist Gait Distance (Feet): 3 Feet Assistive device: None Gait Pattern/deviations: Step-to pattern;Decreased step length - right;Decreased step length - left;Shuffle Gait velocity: slowed Gait velocity interpretation: <1.31 ft/sec, indicative of household ambulator General Gait Details: contact guard assist for safety and balance with stepping over to recliner, slight knee buckling noted       Balance Overall balance assessment: Needs assistance Sitting-balance support: Feet supported Sitting balance-Leahy Scale: Fair       Standing balance-Leahy Scale: Fair Standing balance comment: once steady in standing pt able to static stand without assist                            Cognition Arousal/Alertness: Awake/alert;Lethargic Behavior During Therapy: Flat affect Overall Cognitive Status: No family/caregiver present to determine baseline cognitive functioning  General Comments: pt was able to carry on good conversation with therapist prior to mobility, however after moves to chair he became lethargic with difficulty keeping his eyes open and requiring increased time for responding to questions      Exercises General Exercises - Lower Extremity Ankle Circles/Pumps: AROM;Both;10 reps;Seated Quad Sets: AROM;Both;10 reps;Seated Gluteal Sets: AROM;Both;10 reps;Seated Long Arc Quad: AROM;Both;10 reps;Seated Hip  ABduction/ADduction: AROM;Both;10 reps;Seated Hip Flexion/Marching: AROM;Both;10 reps;Seated    General Comments General comments (skin integrity, edema, etc.): pt with no c/o dizziness today BP with coming to sitting 122/65 and after transfer 135/67, however pt noted to have increased lethargy after transfer and exercise      Pertinent Vitals/Pain Pain Assessment: No/denies pain           PT Goals (current goals can now be found in the care plan section) Acute Rehab PT Goals PT Goal Formulation: With patient Time For Goal Achievement: 01/07/21 Potential to Achieve Goals: Good Progress towards PT goals: Progressing toward goals    Frequency    Min 2X/week      PT Plan Current plan remains appropriate       AM-PAC PT "6 Clicks" Mobility   Outcome Measure  Help needed turning from your back to your side while in a flat bed without using bedrails?: None Help needed moving from lying on your back to sitting on the side of a flat bed without using bedrails?: None Help needed moving to and from a bed to a chair (including a wheelchair)?: A Little Help needed standing up from a chair using your arms (e.g., wheelchair or bedside chair)?: A Little Help needed to walk in hospital room?: A Little Help needed climbing 3-5 steps with a railing? : A Lot 6 Click Score: 19    End of Session Equipment Utilized During Treatment: Gait belt Activity Tolerance: Patient tolerated treatment well Patient left: with call bell/phone within reach;in chair;with chair alarm set Nurse Communication: Mobility status PT Visit Diagnosis: Difficulty in walking, not elsewhere classified (R26.2);Dizziness and giddiness (R42)     Time: 1020-1055 PT Time Calculation (min) (ACUTE ONLY): 35 min  Charges:  $Therapeutic Exercise: 8-22 mins $Therapeutic Activity: 8-22 mins                     Zeffie Bickert B. Migdalia Dk PT, DPT Acute Rehabilitation Services Pager [phone] Office [phone]    [place] 01/06/2021, 11:13 AM

## 2021-01-07 DIAGNOSIS — N179 Acute kidney failure, unspecified: Secondary | ICD-10-CM | POA: Diagnosis not present

## 2021-01-07 DIAGNOSIS — E43 Unspecified severe protein-calorie malnutrition: Secondary | ICD-10-CM | POA: Diagnosis not present

## 2021-01-07 DIAGNOSIS — I161 Hypertensive emergency: Secondary | ICD-10-CM | POA: Diagnosis not present

## 2021-01-07 LAB — SARS CORONAVIRUS 2 (TAT 6-24 HRS): SARS Coronavirus 2: NEGATIVE

## 2021-01-07 LAB — GLUCOSE, CAPILLARY
Glucose-Capillary: 126 mg/dL — ABNORMAL HIGH (ref 70–99)
Glucose-Capillary: 122 mg/dL — ABNORMAL HIGH (ref 70–99)
Glucose-Capillary: 151 mg/dL — ABNORMAL HIGH (ref 70–99)
Glucose-Capillary: 173 mg/dL — ABNORMAL HIGH (ref 70–99)

## 2021-01-07 MED ORDER — CITALOPRAM HYDROBROMIDE 10 MG PO TABS: 10.0000 mg | ORAL_TABLET | Freq: Every day | ORAL | Status: DC

## 2021-01-07 MED ORDER — POLYETHYLENE GLYCOL 3350 17 G PO PACK: 17.0000 g | PACK | Freq: Two times a day (BID) | ORAL | 0 refills | Status: DC

## 2021-01-07 MED ORDER — BISACODYL 10 MG RE SUPP: 10.0000 mg | Freq: Every day | RECTAL | 0 refills | Status: DC | PRN

## 2021-01-07 MED ORDER — HYDRALAZINE HCL 25 MG PO TABS
75.0000 mg | ORAL_TABLET | Freq: Three times a day (TID) | ORAL | Status: DC
  Administered 2021-01-07: 75 mg via ORAL
  Filled 2021-01-07: qty 3

## 2021-01-07 MED ORDER — HYDRALAZINE HCL 25 MG PO TABS
25.0000 mg | ORAL_TABLET | Freq: Once | ORAL | Status: AC
  Administered 2021-01-07: 25 mg via ORAL

## 2021-01-07 MED ORDER — LABETALOL HCL 200 MG PO TABS: 200.0000 mg | ORAL_TABLET | Freq: Two times a day (BID) | ORAL | Status: DC

## 2021-01-07 MED ORDER — SENNA 8.6 MG PO TABS: 2.0000 | ORAL_TABLET | Freq: Every day | ORAL | 0 refills | Status: DC

## 2021-01-07 MED ORDER — HYDRALAZINE HCL 50 MG PO TABS: 50.0000 mg | ORAL_TABLET | Freq: Three times a day (TID) | ORAL | Status: DC

## 2021-01-07 MED ORDER — NEPRO/CARBSTEADY PO LIQD: 237.0000 mL | Freq: Three times a day (TID) | ORAL | 0 refills | Status: DC

## 2021-01-07 MED ORDER — LABETALOL HCL 100 MG PO TABS
100.0000 mg | ORAL_TABLET | Freq: Once | ORAL | Status: AC
  Administered 2021-01-07: 100 mg via ORAL
  Filled 2021-01-07: qty 1

## 2021-01-07 MED ORDER — INSULIN ASPART 100 UNIT/ML ~~LOC~~ SOLN: SUBCUTANEOUS | 11 refills | Status: DC

## 2021-01-07 MED ORDER — LEVALBUTEROL HCL 0.63 MG/3ML IN NEBU: 0.6300 mg | INHALATION_SOLUTION | Freq: Three times a day (TID) | RESPIRATORY_TRACT | 12 refills | Status: DC | PRN

## 2021-01-07 MED ORDER — HYDRALAZINE HCL 50 MG PO TABS
50.0000 mg | ORAL_TABLET | Freq: Three times a day (TID) | ORAL | Status: DC
  Administered 2021-01-07 – 2021-01-08 (×3): 50 mg via ORAL
  Filled 2021-01-07 (×3): qty 1

## 2021-01-07 MED ORDER — PANTOPRAZOLE SODIUM 40 MG PO TBEC: 40.0000 mg | DELAYED_RELEASE_TABLET | Freq: Two times a day (BID) | ORAL | Status: DC

## 2021-01-07 MED ORDER — LABETALOL HCL 200 MG PO TABS
200.0000 mg | ORAL_TABLET | Freq: Two times a day (BID) | ORAL | Status: DC
  Administered 2021-01-07 – 2021-01-08 (×4): 200 mg via ORAL
  Filled 2021-01-07 (×4): qty 1

## 2021-01-07 MED ORDER — MELATONIN 5 MG PO TABS: 5.0000 mg | ORAL_TABLET | Freq: Every day | ORAL | 0 refills | Status: DC

## 2021-01-07 NOTE — Discharge Summary (Addendum)
PATIENT DETAILS Name: Ruben Camacho. Age: 65 y.o. Sex: male Date of Birth: Feb 05, 1956 MRN: [id number]. Admitting Physician: Lequita Halt, MD [id number], Ines Bloomer, MD  Admit Date: 12/14/2020 Discharge date: 01/08/2021  Recommendations for Outpatient Follow-up:  1. Follow up with PCP in 1-2 weeks 2. Please obtain CMP/CBC in one week 3. Please ensure follow-up with hemodialysis and nephrology 4. Please continue to adjust BP medications-has been cut back on some of his medications during this hospital stay. 5. Needs outpatient referral to GI for endoscopic evaluation.  Admitted From:  Home  Disposition: SNF   Home Health: No  Equipment/Devices: None  Discharge Condition: Stable  CODE STATUS: FULL CODE  Diet recommendation:  Diet Order            Diet - low sodium heart healthy           Diet renal/carb modified with fluid restriction Fluid restriction: 1200 mL Fluid; Room service appropriate? No; Fluid consistency: Thin  Diet effective now                  Brief Narrative: Patient is a 65 y.o. male with a history of CKD stage IV, DM-2, HLD presented with shortness of breath-he was found to have pulmonary edema in the setting of worsening renal function.  Hospital course complicated by possible upper GI bleeding and blood loss anemia.  Significant events: 2/14>> admit for worsening SOB-pulmonary edema in the setting of AKI on CKD stage IV. 2/18>> GI consult for coffee-ground emesis-patient refused EGD. 2/21>> started on HD  Significant studies: 2/14>> Echo: EF 40%, global hypokinesis, grade 2 diastolic dysfunction, RVSP 26.6 2/14>> chest x-ray: Bilateral airspace disease 2/15>> chest x-ray: Bilateral pulmonary infiltrate/edema 2/15>> renal ultrasound: Chronic medical renal disease-no hydronephrosis 2/20>> Echo: EF 65-70%, severe LVH, grade 1 diastolic dysfunction  Antimicrobial therapy: None  Microbiology data: None  Procedures : 2/21>>  TDC and new left upper arm AVG  Consults: Nephrology Vascular surgery GI Cardiology   Brief Hospital Course: AKI on CKD stage IV: AKI likely hemodynamically mediated-on HD per nephrology.  No signs of renal recovery as of yet.    Continue to monitor closely and follow-up with nephrology.  Acute on chronic diastolic heart failure in the setting of worsening renal function: Volume status stable-diuresis with HD.  Acute hypoxic respiratory failure: Due to pulmonary edema-resolved  Upper GI bleeding: Resolved-GI consulted-patient refused EGD.  Hemoglobin stable.  Continue PPI twice daily.  Chronic microcytic anemia: Continue to follow closely-we will need outpatient endoscopic evaluation.  Left knee pain: Due to gout flare-resolved after a short course of steroids.   Hyponatremia: Resolved  Anxiety/depression: Stable continue Celexa  Atrial tachycardia: Stable-continue labetalol-cardiology has signed off.    Minimally elevated troponins: Demand ischemia-cardiology planning on outpatient stress testing.  HTN: Had uncontrolled hypertension on admission-however over the past several days-blood pressure has been very well controlled-in fact patient has been symptomatic post HD-he also did have a syncope a few days ago due to orthostatic hypotension-no longer on Imdur-decrease hydralazine to 50 mg 3 times daily-remains on 200 mg twice daily of labetalol.  Discussed with nephrology on the day of discharge-they will continue to follow and optimize in the outpatient setting.  Syncope: Occurred on 3/7-when patient was ambulated off the bed by PT-was hypotensive-very brief.  Have adjusted antihypertensive regimen.    Telemetry unremarkable.  DM-2 (A1c 7.0 on 2/15): Continue SSI  Deconditioning/debility: PT recommending SNF.  Constipation: S/p tapwater enema-now on bowel regimen with MiraLAX/Senokot  Nutrition Problem: Nutrition Problem: Severe Malnutrition Etiology: acute  illness (SOB r/t CHF causing poor appetite) Signs/Symptoms: mild fat depletion,mild muscle depletion,moderate muscle depletion,moderate fat depletion Interventions: Ensure Enlive (each supplement provides 350kcal and 20 grams of protein)   Discharge Diagnoses:  Active Problems:   CHF (congestive heart failure) (HCC)   Protein-calorie malnutrition, severe   Discharge Instructions:  Activity:  As tolerated with Full fall precautions use walker/cane & assistance as needed   Discharge Instructions    Diet - low sodium heart healthy   Complete by: As directed    Discharge instructions   Complete by: As directed    Follow with Primary MD  Horald Pollen, MD in 1-2 weeks  Follow with nephrology and hemodialysis clinic as instructed  Please get a complete blood count and chemistry panel checked by your Primary MD at your next visit, and again as instructed by your Primary MD.  Get Medicines reviewed and adjusted: Please take all your medications with you for your next visit with your Primary MD  Laboratory/radiological data: Please request your Primary MD to go over all hospital tests and procedure/radiological results at the follow up, please ask your Primary MD to get all Hospital records sent to his/her office.  In some cases, they will be blood work, cultures and biopsy results pending at the time of your discharge. Please request that your primary care M.D. follows up on these results.  Also Note the following: If you experience worsening of your admission symptoms, develop shortness of breath, life threatening emergency, suicidal or homicidal thoughts you must seek medical attention immediately by calling 911 or calling your MD immediately  if symptoms less severe.  You must read complete instructions/literature along with all the possible adverse reactions/side effects for all the Medicines you take and that have been prescribed to you. Take any new Medicines after you  have completely understood and accpet all the possible adverse reactions/side effects.   Do not drive when taking Pain medications or sleeping medications (Benzodaizepines)  Do not take more than prescribed Pain, Sleep and Anxiety Medications. It is not advisable to combine anxiety,sleep and pain medications without talking with your primary care practitioner  Special Instructions: If you have smoked or chewed Tobacco  in the last 2 yrs please stop smoking, stop any regular Alcohol  and or any Recreational drug use.  Wear Seat belts while driving.  Please note: You were cared for by a hospitalist during your hospital stay. Once you are discharged, your primary care physician will handle any further medical issues. Please note that NO REFILLS for any discharge medications will be authorized once you are discharged, as it is imperative that you return to your primary care physician (or establish a relationship with a primary care physician if you do not have one) for your post hospital discharge needs so that they can reassess your need for medications and monitor your lab values.   Check CBGs before meals and at bedtime   Increase activity slowly   Complete by: As directed    No wound care   Complete by: As directed      Allergies as of 01/08/2021   No Known Allergies     Medication List    STOP taking these medications   amLODipine 10 MG tablet Commonly known as: NORVASC   aspirin 81 MG tablet   azithromycin 250 MG tablet Commonly known as: ZITHROMAX   glipiZIDE 2.5 MG 24 hr tablet Commonly known as: GLUCOTROL XL  Lancets 30G Misc   losartan-hydrochlorothiazide 100-25 MG tablet Commonly known as: HYZAAR   NIFEdipine 90 MG 24 hr tablet Commonly known as: PROCARDIA XL/NIFEDICAL-XL   Precision QID Test test strip Generic drug: glucose blood   Simple Diagnostics Lancing Dev Misc     TAKE these medications   atorvastatin 40 MG tablet Commonly known as: LIPITOR Take 1  tablet (40 mg total) by mouth daily at 6 PM.   bisacodyl 10 MG suppository Commonly known as: DULCOLAX Place 1 suppository (10 mg total) rectally daily as needed for severe constipation.   citalopram 10 MG tablet Commonly known as: CELEXA Take 1 tablet (10 mg total) by mouth daily.   diclofenac Sodium 1 % Gel Commonly known as: VOLTAREN Apply 4 g topically 4 (four) times daily. What changed:   when to take this  reasons to take this   feeding supplement (NEPRO CARB STEADY) Liqd Take 237 mLs by mouth 3 (three) times daily between meals.   hydrALAZINE 25 MG tablet Commonly known as: APRESOLINE Take 1 tablet (25 mg total) by mouth every 8 (eight) hours.   insulin aspart 100 UNIT/ML injection Commonly known as: novoLOG 0-9 Units, Subcutaneous, 3 times daily with meals CBG < 70: Implement Hypoglycemia measures CBG 70 - 120: 0 units CBG 121 - 150: 1 unit CBG 151 - 200: 2 units CBG 201 - 250: 3 units CBG 251 - 300: 5 units CBG 301 - 350: 7 units CBG 351 - 400: 9 units CBG > 400: call MD   labetalol 200 MG tablet Commonly known as: NORMODYNE Take 1 tablet (200 mg total) by mouth 2 (two) times daily.   levalbuterol 0.63 MG/3ML nebulizer solution Commonly known as: XOPENEX Take 3 mLs (0.63 mg total) by nebulization every 8 (eight) hours as needed for wheezing or shortness of breath.   melatonin 5 MG Tabs Take 1 tablet (5 mg total) by mouth at bedtime.   pantoprazole 40 MG tablet Commonly known as: PROTONIX Take 1 tablet (40 mg total) by mouth 2 (two) times daily.   polyethylene glycol 17 g packet Commonly known as: MIRALAX / GLYCOLAX Take 17 g by mouth 2 (two) times daily.   senna 8.6 MG Tabs tablet Commonly known as: SENOKOT Take 2 tablets (17.2 mg total) by mouth at bedtime.       Contact information for follow-up providers    O'Neal, Cassie Freer, MD. Go to.   Specialties: Internal Medicine, Cardiology, Radiology Why: Hospital follow-up with Cardiology  scheduled for 01/29/2021 at 9:00am. Please arrive 15 minutes early for check-in. If this date/time does not work for you, please call our office to reschedule. Contact information: [place] [id number] [phone]        Horald Pollen, MD. Schedule an appointment as soon as possible for a visit in 1 week(s).   Specialty: Internal Medicine Contact information: [place] [id number] [phone]            Contact information for after-discharge care    Destination    [facility] .   Service: Skilled Nursing Contact information: [place] [phone]                 No Known Allergies    Other Procedures/Studies: DG Chest 1 View  Result Date: 12/15/2020 CLINICAL DATA:  Congestive heart failure. EXAM: CHEST  1 VIEW COMPARISON:  12/14/2020. FINDINGS: Mediastinum hilar structures normal. Cardiomegaly with pulmonary venous congestion  again noted. Bilateral pulmonary infiltrates/edema right side greater than left again noted without interim change. No pleural effusion or pneumothorax. IMPRESSION: 1. Cardiomegaly with pulmonary venous congestion again noted. 2. Bilateral pulmonary infiltrates/edema right side greater than left again noted. Electronically Signed   By: Marcello Moores  Register   On: 12/15/2020 07:57   DG Abd 1 View  Result Date: 12/30/2020 CLINICAL DATA:  Constipation, rectal pain. EXAM: ABDOMEN - 1 VIEW COMPARISON:  December 18, 2020. FINDINGS: The bowel gas pattern is normal. Mild amount of stool is seen throughout the colon and rectum. No radio-opaque calculi or other significant radiographic abnormality are seen. IMPRESSION: Mild stool burden.  No evidence of bowel obstruction or ileus. Electronically Signed   By: Marijo Conception M.D.   On: 12/30/2020 08:16   DG Abd 1 View  Result Date: 12/18/2020 CLINICAL DATA:  Abdominal pain with nausea vomiting. EXAM: ABDOMEN  - 1 VIEW COMPARISON:  None. FINDINGS: The bowel gas pattern is normal. No radio-opaque calculi or other significant radiographic abnormality are seen. IMPRESSION: Negative. Electronically Signed   By: Misty Stanley M.D.   On: 12/18/2020 10:14   US RENAL  Result Date: 12/15/2020 CLINICAL DATA:  Acute kidney injury. EXAM: RENAL / URINARY TRACT ULTRASOUND COMPLETE COMPARISON:  None. FINDINGS: Right Kidney: Renal measurements: 8.4 x 5.0 x 4.2 cm = volume: 94 mL. Increased renal parenchymal echogenicity. No mass or hydronephrosis visualized. No visualized renal calculi. Left Kidney: Renal measurements: 8.3 x 4.4 x 3.7 cm = volume: 7 mL. Increased renal parenchymal echogenicity. There is some cortical scarring in the lateral kidney. No mass or hydronephrosis visualized. No visualized renal calculi. Bladder: Appears normal for degree of bladder distention. Other: None. IMPRESSION: 1. Increased bilateral renal echogenicity consistent with chronic medical renal disease. 2. No hydronephrosis. Electronically Signed   By: Keith Rake M.D.   On: 12/15/2020 21:30   DG Chest Port 1 View  Result Date: 12/21/2020 CLINICAL DATA:  End-stage renal disease. Dialysis catheter placement. EXAM: PORTABLE CHEST 1 VIEW COMPARISON:  12/16/2020 FINDINGS: A right jugular catheter has been placed and terminates near the superior cavoatrial junction. The cardiac silhouette remains mildly enlarged. There is persistent elevation of the right hemidiaphragm. Patchy bilateral airspace disease, greatest in the lung bases, has not significantly changed. No sizable pleural effusion or pneumothorax is identified. IMPRESSION: 1. Interval right jugular catheter placement as above. 2. Unchanged bilateral airspace disease. Electronically Signed   By: Logan Bores M.D.   On: 12/21/2020 12:54   DG CHEST PORT 1 VIEW  Result Date: 12/16/2020 CLINICAL DATA:  Shortness of breath EXAM: PORTABLE CHEST 1 VIEW COMPARISON:  12/15/2020 FINDINGS:  Cardiomegaly. Airspace disease throughout the right lung and in the left lung base are unchanged since prior study. No visible effusions or pneumothorax. No acute bony abnormality. IMPRESSION: Bilateral airspace disease, right greater than left again noted, unchanged. Electronically Signed   By: Rolm Baptise M.D.   On: 12/16/2020 06:59   DG Chest Port 1 View  Result Date: 12/14/2020 CLINICAL DATA:  Shortness of breath, cough, hypertensive EXAM: PORTABLE CHEST 1 VIEW COMPARISON:  12/16/2015 FINDINGS: Cardiomegaly. Bilateral airspace disease, right greater than left. No visible effusions or pneumothorax. No acute bony abnormality. IMPRESSION: Bilateral airspace disease, right greater than left, favor multifocal pneumonia. Electronically Signed   By: Rolm Baptise M.D.   On: 12/14/2020 10:01   DG Fluoro Guide CV Line-No Report  Result Date: 12/21/2020 Fluoroscopy was utilized by the requesting physician.  No radiographic interpretation.  ECHOCARDIOGRAM COMPLETE  Result Date: 12/21/2020    ECHOCARDIOGRAM REPORT   Patient Name:   Ruben Camacho. Date of Exam: 12/20/2020 Medical Rec #:  [id number]          Height:       69.0 in Accession #:    [id number]         Weight:       159.6 lb Date of Birth:  [birth year]          BSA:          1.877 m Patient Age:    74 years           BP:           123/70 mmHg Patient Gender: M                  HR:           76 bpm. Exam Location:  Inpatient Procedure: 2D Echo, Cardiac Doppler and Color Doppler Indications:    Abnormal ECG R94.31  History:        Patient has prior history of Echocardiogram examinations, most                 recent 12/14/2020. CHF, Stroke; Risk Factors:Hypertension,                 Dyslipidemia and Diabetes.  Sonographer:    Alvino Chapel RCS Referring Phys: [phone] AVA SWAYZE IMPRESSIONS  1. Compared to echo from 12/14/20 LVEF has improved.  2. Left ventricular ejection fraction, by estimation, is 65 to 70%. The left ventricle has normal function. The left  ventricle has no regional wall motion abnormalities. There is severe left ventricular hypertrophy. Left ventricular diastolic parameters  are consistent with Grade I diastolic dysfunction (impaired relaxation).  3. Right ventricular systolic function is normal. The right ventricular size is normal.  4. Left atrial size was mildly dilated.  5. The mitral valve is normal in structure. Mild mitral valve regurgitation.  6. The aortic valve is normal in structure. Aortic valve regurgitation is not visualized.  7. The inferior vena cava is normal in size with greater than 50% respiratory variability, suggesting right atrial pressure of 3 mmHg. FINDINGS  Left Ventricle: Left ventricular ejection fraction, by estimation, is 65 to 70%. The left ventricle has normal function. The left ventricle has no regional wall motion abnormalities. The left ventricular internal cavity size was normal in size. There is  severe left ventricular hypertrophy. Left ventricular diastolic parameters are consistent with Grade I diastolic dysfunction (impaired relaxation). Right Ventricle: The right ventricular size is normal. Right vetricular wall thickness was not assessed. Right ventricular systolic function is normal. Left Atrium: Left atrial size was mildly dilated. Right Atrium: Right atrial size was normal in size. Pericardium: Trivial pericardial effusion is present. Mitral Valve: The mitral valve is normal in structure. Mild mitral valve regurgitation. Tricuspid Valve: The tricuspid valve is normal in structure. Tricuspid valve regurgitation is trivial. Aortic Valve: The aortic valve is normal in structure. Aortic valve regurgitation is not visualized. Pulmonic Valve: The pulmonic valve was normal in structure. Pulmonic valve regurgitation is not visualized. Aorta: The aortic root and ascending aorta are structurally normal, with no evidence of dilitation. Venous: The inferior vena cava is normal in size with greater than 50% respiratory  variability, suggesting right atrial pressure of 3 mmHg. IAS/Shunts: The interatrial septum was not assessed.  LEFT VENTRICLE PLAX 2D LVIDd:  4.20 cm  Diastology LVIDs:         2.70 cm  LV e' medial:    5.44 cm/s LV PW:         1.80 cm  LV E/e' medial:  15.9 LV IVS:        1.50 cm  LV e' lateral:   7.07 cm/s LVOT diam:     1.90 cm  LV E/e' lateral: 12.2 LV SV:         64 LV SV Index:   34 LVOT Area:     2.84 cm  RIGHT VENTRICLE RV S prime:     21.90 cm/s TAPSE (M-mode): 2.3 cm LEFT ATRIUM             Index       RIGHT ATRIUM           Index LA diam:        4.10 cm 2.18 cm/m  RA Area:     19.00 cm LA Vol (A2C):   70.7 ml 37.66 ml/m RA Volume:   50.50 ml  26.90 ml/m LA Vol (A4C):   67.4 ml 35.91 ml/m LA Biplane Vol: 71.2 ml 37.93 ml/m  AORTIC VALVE LVOT Vmax:   138.00 cm/s LVOT Vmean:  95.800 cm/s LVOT VTI:    0.225 m  AORTA Ao Root diam: 3.20 cm MITRAL VALVE MV Area (PHT): 3.20 cm    SHUNTS MV Decel Time: 237 msec    Systemic VTI:  0.22 m MV E velocity: 86.50 cm/s  Systemic Diam: 1.90 cm MV A velocity: 92.50 cm/s MV E/A ratio:  0.94 Dorris Carnes MD Electronically signed by Dorris Carnes MD Signature Date/Time: 12/21/2020/1:19:23 PM    Final    ECHOCARDIOGRAM COMPLETE  Result Date: 12/14/2020    ECHOCARDIOGRAM REPORT   Patient Name:   Ruben Camacho. Date of Exam: 12/14/2020 Medical Rec #:  [id number]          Height:       69.0 in Accession #:    [id number]         Weight:       185.0 lb Date of Birth:  1956-10-28          BSA:          1.999 m Patient Age:    26 years           BP:           173/102 mmHg Patient Gender: M                  HR:           73 bpm. Exam Location:  Inpatient Procedure: 2D Echo, Cardiac Doppler and Color Doppler Indications:    CHF-Acute Diastolic  History:        Patient has no prior history of Echocardiogram examinations.                 Signs/Symptoms:Shortness of Breath; Risk Factors:Hypertension,                 Diabetes and Dyslipidemia. CKD.  Sonographer:    Clayton Lefort  RDCS (AE) Referring Phys: [id number] [place]  1. Left ventricular ejection fraction, by estimation, is 40%. The left ventricle has mild to moderately decreased function. The left ventricle demonstrates global hypokinesis. There is mild left ventricular hypertrophy. Left ventricular diastolic parameters are consistent with Grade II diastolic dysfunction (pseudonormalization).  2. Right ventricular systolic function is normal.  The right ventricular size is normal. There is normal pulmonary artery systolic pressure. The estimated right ventricular systolic pressure is 123XX123 mmHg.  3. Left atrial size was moderately dilated.  4. The mitral valve is abnormal. Moderate mitral valve regurgitation. No evidence of mitral stenosis.  5. The aortic valve is tricuspid. Aortic valve regurgitation is not visualized. No aortic stenosis is present.  6. The inferior vena cava is normal in size with greater than 50% respiratory variability, suggesting right atrial pressure of 3 mmHg. FINDINGS  Left Ventricle: Left ventricular ejection fraction, by estimation, is 40%. The left ventricle has mild to moderately decreased function. The left ventricle demonstrates global hypokinesis. The left ventricular internal cavity size was normal in size. There is mild left ventricular hypertrophy. Left ventricular diastolic parameters are consistent with Grade II diastolic dysfunction (pseudonormalization). Right Ventricle: The right ventricular size is normal. No increase in right ventricular wall thickness. Right ventricular systolic function is normal. There is normal pulmonary artery systolic pressure. The tricuspid regurgitant velocity is 2.43 m/s, and  with an assumed right atrial pressure of 3 mmHg, the estimated right ventricular systolic pressure is 123XX123 mmHg. Left Atrium: Left atrial size was moderately dilated. Right Atrium: Right atrial size was normal in size. Pericardium: There is no evidence of pericardial effusion.  Mitral Valve: The mitral valve is abnormal. Moderate mitral valve regurgitation. No evidence of mitral valve stenosis. MV peak gradient, 4.2 mmHg. The mean mitral valve gradient is 1.0 mmHg. Tricuspid Valve: The tricuspid valve is normal in structure. Tricuspid valve regurgitation is trivial. Aortic Valve: The aortic valve is tricuspid. Aortic valve regurgitation is not visualized. No aortic stenosis is present. Aortic valve mean gradient measures 2.0 mmHg. Aortic valve peak gradient measures 3.3 mmHg. Aortic valve area, by VTI measures 2.82 cm. Pulmonic Valve: The pulmonic valve was normal in structure. Pulmonic valve regurgitation is trivial. Aorta: The aortic root is normal in size and structure. Venous: The inferior vena cava is normal in size with greater than 50% respiratory variability, suggesting right atrial pressure of 3 mmHg. IAS/Shunts: No atrial level shunt detected by color flow Doppler.  LEFT VENTRICLE PLAX 2D LVIDd:         4.00 cm      Diastology LVIDs:         3.60 cm      LV e' medial:    5.11 cm/s LV PW:         1.80 cm      LV E/e' medial:  18.4 LV IVS:        1.40 cm      LV e' lateral:   5.98 cm/s LVOT diam:     2.20 cm      LV E/e' lateral: 15.7 LV SV:         40 LV SV Index:   20 LVOT Area:     3.80 cm  LV Volumes (MOD) LV vol d, MOD A2C: 147.0 ml LV vol d, MOD A4C: 138.0 ml LV vol s, MOD A2C: 82.2 ml LV vol s, MOD A4C: 81.1 ml LV SV MOD A2C:     64.8 ml LV SV MOD A4C:     138.0 ml LV SV MOD BP:      59.3 ml RIGHT VENTRICLE             IVC RV Basal diam:  2.80 cm     IVC diam: 1.80 cm RV S prime:     11.90 cm/s TAPSE (M-mode): 2.1  cm LEFT ATRIUM             Index       RIGHT ATRIUM           Index LA diam:        4.10 cm 2.05 cm/m  RA Area:     17.10 cm LA Vol (A2C):   85.5 ml 42.78 ml/m RA Volume:   42.60 ml  21.31 ml/m LA Vol (A4C):   71.2 ml 35.62 ml/m LA Biplane Vol: 81.0 ml 40.53 ml/m  AORTIC VALVE AV Area (Vmax):    2.63 cm AV Area (Vmean):   2.90 cm AV Area (VTI):     2.82  cm AV Vmax:           91.40 cm/s AV Vmean:          59.800 cm/s AV VTI:            0.140 m AV Peak Grad:      3.3 mmHg AV Mean Grad:      2.0 mmHg LVOT Vmax:         63.30 cm/s LVOT Vmean:        45.600 cm/s LVOT VTI:          0.104 m LVOT/AV VTI ratio: 0.74  AORTA Ao Root diam: 3.40 cm Ao Asc diam:  3.40 cm MITRAL VALVE                 TRICUSPID VALVE MV Area (PHT): 4.29 cm      TR Peak grad:   23.6 mmHg MV Area VTI:   1.86 cm      TR Vmax:        243.00 cm/s MV Peak grad:  4.2 mmHg MV Mean grad:  1.0 mmHg      SHUNTS MV Vmax:       1.03 m/s      Systemic VTI:  0.10 m MV Vmean:      53.6 cm/s     Systemic Diam: 2.20 cm MV Decel Time: 177 msec MR Peak grad:    146.9 mmHg MR Mean grad:    96.0 mmHg MR Vmax:         606.00 cm/s MR Vmean:        462.0 cm/s MR PISA:         1.57 cm MR PISA Eff ROA: 10 mm MR PISA Radius:  0.50 cm MV E velocity: 93.80 cm/s MV A velocity: 36.00 cm/s MV E/A ratio:  2.61 Loralie Champagne MD Electronically signed by Loralie Champagne MD Signature Date/Time: 12/14/2020/5:37:39 PM    Final    VAS [place] UPPER EXT VEIN MAPPING (PRE-OP AVF)  Result Date: 12/17/2020 UPPER EXTREMITY VEIN MAPPING  Indications: Pre-access. History: CKD stage IV/V.  Limitations: Body habitus, patient unable to keep arm extended and rotated. Comparison Study: No prior study on file Performing Technologist: Sharion Dove RVS  Examination Guidelines: A complete evaluation includes B-mode imaging, spectral Doppler, color Doppler, and power Doppler as needed of all accessible portions of each vessel. Bilateral testing is considered an integral part of a complete examination. Limited examinations for reoccurring indications may be performed as noted. +-----------------+-------------+----------+--------+ Right Cephalic   Diameter (cm)Depth (cm)Findings +-----------------+-------------+----------+--------+ Prox upper arm       0.16        0.34            +-----------------+-------------+----------+--------+ Mid upper  arm        0.20  0.31            +-----------------+-------------+----------+--------+ Dist upper arm       0.17        0.36            +-----------------+-------------+----------+--------+ Antecubital fossa    0.22        0.38            +-----------------+-------------+----------+--------+ Prox forearm         0.21        0.45            +-----------------+-------------+----------+--------+ Mid forearm          0.22        0.33            +-----------------+-------------+----------+--------+ Wrist                0.19        0.21            +-----------------+-------------+----------+--------+ +-----------------+-------------+----------+--------+ Left Cephalic    Diameter (cm)Depth (cm)Findings +-----------------+-------------+----------+--------+ Antecubital fossa    0.40        0.37            +-----------------+-------------+----------+--------+ Prox forearm         0.23        0.41            +-----------------+-------------+----------+--------+ Mid forearm          0.21        0.31            +-----------------+-------------+----------+--------+ Wrist                0.19        0.44            +-----------------+-------------+----------+--------+ +-----------------+-------------+----------+--------+ Left Basilic     Diameter (cm)Depth (cm)Findings +-----------------+-------------+----------+--------+ Dist upper arm       0.26        0.67    origin  +-----------------+-------------+----------+--------+ Antecubital fossa    0.24        0.89            +-----------------+-------------+----------+--------+ Prox forearm         0.28        0.32            +-----------------+-------------+----------+--------+ Mid forearm          0.17        0.36            +-----------------+-------------+----------+--------+ Wrist                0.14        0.30            +-----------------+-------------+----------+--------+ *See table(s)  above for measurements and observations.  Diagnosing physician: Monica Martinez MD Electronically signed by Monica Martinez MD on 12/17/2020 at 4:30:22 PM.    Final      TODAY-DAY OF DISCHARGE:  Subjective:   [place] today has no headache,no chest abdominal pain,no new weakness tingling or numbness, feels much better wants to go home today.  Objective:   Blood pressure 133/64, pulse 66, temperature 98.6 F (37 C), temperature source Oral, resp. rate 18, height '5\' 9"'$  (1.753 m), weight 66.8 kg, SpO2 97 %.  Intake/Output Summary (Last 24 hours) at 01/08/2021 0734 Last data filed at 01/07/2021 1900 Gross per 24 hour  Intake 840 ml  Output 150 ml  Net 690 ml   Filed Weights   01/06/21 2205 01/07/21  [id number] 01/08/21 0440  Weight: 66 kg 68.1 kg 66.8 kg    Exam: Awake Alert, Oriented *3, No new F.N deficits, Normal affect [place].AT,PERRAL Supple Neck,No JVD, No cervical lymphadenopathy appriciated.  Symmetrical Chest wall movement, Good air movement bilaterally, CTAB RRR,No Gallops,Rubs or new Murmurs, No Parasternal Heave +ve B.Sounds, Abd Soft, Non tender, No organomegaly appriciated, No rebound -guarding or rigidity. No Cyanosis, Clubbing or edema, No new Rash or bruise   PERTINENT RADIOLOGIC STUDIES: No results found.   PERTINENT LAB RESULTS: CBC: Recent Labs    01/06/21 2018  WBC 7.3  HGB 9.4*  HCT 31.2*  PLT 232   CMET CMP     Component Value Date/Time   NA 135 01/06/2021 2018   NA 136 08/06/2019 1400   K 3.9 01/06/2021 2018   CL 98 01/06/2021 2018   CO2 26 01/06/2021 2018   GLUCOSE 87 01/06/2021 2018   BUN 63 (H) 01/06/2021 2018   BUN 20 08/06/2019 1400   CREATININE 5.51 (H) 01/06/2021 2018   CREATININE 1.28 08/19/2015 0844   CALCIUM 8.5 (L) 01/06/2021 2018   PROT 6.2 (L) 12/16/2020 0138   PROT 6.7 08/06/2019 1400   ALBUMIN 3.0 (L) 01/06/2021 2018   ALBUMIN 4.2 08/06/2019 1400   AST 22 12/16/2020 0138   ALT 36 12/16/2020 0138   ALKPHOS 75  12/16/2020 0138   BILITOT 1.0 12/16/2020 0138   BILITOT 0.5 08/06/2019 1400   GFRNONAA 11 (L) 01/06/2021 2018   GFRNONAA 76 12/01/2014 1037   GFRAA 33 (L) 08/06/2019 1400   GFRAA 88 12/01/2014 1037    GFR Estimated Creatinine Clearance: 12.6 mL/min (A) (by C-G formula based on SCr of 5.51 mg/dL (H)). No results for input(s): LIPASE, AMYLASE in the last 72 hours. No results for input(s): CKTOTAL, CKMB, CKMBINDEX, TROPONINI in the last 72 hours. Invalid input(s): POCBNP No results for input(s): DDIMER in the last 72 hours. No results for input(s): HGBA1C in the last 72 hours. No results for input(s): CHOL, HDL, LDLCALC, TRIG, CHOLHDL, LDLDIRECT in the last 72 hours. No results for input(s): TSH, T4TOTAL, T3FREE, THYROIDAB in the last 72 hours.  Invalid input(s): FREET3 No results for input(s): VITAMINB12, FOLATE, FERRITIN, TIBC, IRON, RETICCTPCT in the last 72 hours. Coags: No results for input(s): INR in the last 72 hours.  Invalid input(s): PT Microbiology: Recent Results (from the past 240 hour(s))  SARS CORONAVIRUS 2 (TAT 6-24 HRS) Nasopharyngeal Nasopharyngeal Swab     Status: None   Collection Time: 01/07/21  9:32 AM   Specimen: Nasopharyngeal Swab  Result Value Ref Range Status   SARS Coronavirus 2 NEGATIVE NEGATIVE Final    Comment: (NOTE) SARS-CoV-2 target nucleic acids are NOT DETECTED.  The SARS-CoV-2 RNA is generally detectable in upper and lower respiratory specimens during the acute phase of infection. Negative results do not preclude SARS-CoV-2 infection, do not rule out co-infections with other pathogens, and should not be used as the sole basis for treatment or other patient management decisions. Negative results must be combined with clinical observations, patient history, and epidemiological information. The expected result is Negative.  Fact Sheet for Patients: SugarRoll.be  Fact Sheet for Healthcare  Providers: https://www.woods-mathews.com/  This test is not yet approved or cleared by the [place] FDA and  has been authorized for detection and/or diagnosis of SARS-CoV-2 by FDA under an Emergency Use Authorization (EUA). This EUA will remain  in effect (meaning this test can be used) for the duration of the COVID-19 declaration under Se  ction 564(b)(1) of the Act, 21 U.S.C. section 360bbb-3(b)(1), unless the authorization is terminated or revoked sooner.  Performed at [facility], [place] [address] [place],  [address]     FURTHER DISCHARGE INSTRUCTIONS:  Get Medicines reviewed and adjusted: Please take all your medications with you for your next visit with your Primary MD  Laboratory/radiological data: Please request your Primary MD to go over all hospital tests and procedure/radiological results at the follow up, please ask your Primary MD to get all Hospital records sent to his/her office.  In some cases, they will be blood work, cultures and biopsy results pending at the time of your discharge. Please request that your primary care M.D. goes through all the records of your hospital data and follows up on these results.  Also Note the following: If you experience worsening of your admission symptoms, develop shortness of breath, life threatening emergency, suicidal or homicidal thoughts you must seek medical attention immediately by calling 911 or calling your MD immediately  if symptoms less severe.  You must read complete instructions/literature along with all the possible adverse reactions/side effects for all the Medicines you take and that have been prescribed to you. Take any new Medicines after you have completely understood and accpet all the possible adverse reactions/side effects.   Do not drive when taking Pain medications or sleeping medications (Benzodaizepines)  Do not take more than prescribed Pain, Sleep and Anxiety Medications. It is  not advisable to combine anxiety,sleep and pain medications without talking with your primary care practitioner  Special Instructions: If you have smoked or chewed Tobacco  in the last 2 yrs please stop smoking, stop any regular Alcohol  and or any Recreational drug use.  Wear Seat belts while driving.  Please note: You were cared for by a hospitalist during your hospital stay. Once you are discharged, your primary care physician will handle any further medical issues. Please note that NO REFILLS for any discharge medications will be authorized once you are discharged, as it is imperative that you return to your primary care physician (or establish a relationship with a primary care physician if you do not have one) for your post hospital discharge needs so that they can reassess your need for medications and monitor your lab values.  Total Time spent coordinating discharge including counseling, education and face to face time equals 35 minutes.  SignedOren Binet 01/08/2021 7:34 AM

## 2021-01-07 NOTE — Progress Notes (Signed)
Patient's seat schedule at [facility] is MWF 12:45pm with a 12:25pm arrival.  We are planning for start in the clinic tomorrow, Friday, 01/08/21. Patient needs to arrive at 11:45am on first day to sign intake paperwork. Information relayed to [facility] CSW/N. Rayyan. Navigator asked Renal NP to send orders to clinic.  Alphonzo Cruise, [place] Renal Navigator [phone]
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
## 2021-01-07 NOTE — Progress Notes (Signed)
Patient ID: Ruben Hatten., male   DOB: [birth year], 65 y.o.   MRN: [id number] S: Patient feels well today with no complaints.  Tolerating dialysis with no issues.  Planning for discharge O:BP 125/65 (BP Location: Right Arm)   Pulse 84   Temp 98.4 F (36.9 C) (Oral)   Resp 17   Ht '5\' 9"'$  (1.753 m)   Wt 68.1 kg   SpO2 99%   BMI 22.17 kg/m   Intake/Output Summary (Last 24 hours) at 01/07/2021 1320 Last data filed at 01/07/2021 0955 Gross per 24 hour  Intake 540 ml  Output 1800 ml  Net -1260 ml   Intake/Output: I/O last 3 completed shifts: In: [id number] [P.O.:660; I.V.:3] Out: 1800 [Urine:300; Other:1500]  Intake/Output this shift:  Total I/O In: 120 [P.O.:120] Out: -  Weight change: -1.2 kg Gen: NAD, lying in bed CVS: Normal rate Resp: Bilateral chest rise, no increased work of breathing Abd: +BS,soft, NT/ND Ext: no edema LUE AVG +T/B  Recent Labs  Lab 01/01/21 0228 01/02/21 1049 01/03/21 0342 01/05/21 0240 01/06/21 2018  NA 134* 132* 135 134* 135  K 3.6 3.4* 5.0 4.5 3.9  CL 97* 95* 100 102 98  CO2 '25 24 24 '$ 20* 26  GLUCOSE 117* 118* 159* 99 87  BUN 32* 55* 31* 84* 63*  CREATININE 3.83* 5.38* 3.71* 6.55* 5.51*  ALBUMIN 2.8* 2.8* 2.8* 2.7* 3.0*  CALCIUM 8.5* 8.8* 9.2 8.7* 8.5*  PHOS 2.9 4.5 3.4 3.9 4.4   Liver Function Tests: Recent Labs  Lab 01/03/21 0342 01/05/21 0240 01/06/21 2018  ALBUMIN 2.8* 2.7* 3.0*   No results for input(s): LIPASE, AMYLASE in the last 168 hours. No results for input(s): AMMONIA in the last 168 hours. CBC: Recent Labs  Lab 01/02/21 1049 01/05/21 0240 01/06/21 2018  WBC 9.3 7.6 7.3  HGB 8.7* 8.5* 9.4*  HCT 28.7* 26.5* 31.2*  MCV 78.2* 77.5* 80.0  PLT 219 PLATELET CLUMPS NOTED ON SMEAR, UNABLE TO ESTIMATE 232   Cardiac Enzymes: No results for input(s): CKTOTAL, CKMB, CKMBINDEX, TROPONINI in the last 168 hours. CBG: Recent Labs  Lab 01/06/21 1152 01/06/21 1741 01/06/21 2226 01/07/21 0748 01/07/21 1226  GLUCAP 151* 74 132*  126* 122*    Iron Studies: No results for input(s): IRON, TIBC, TRANSFERRIN, FERRITIN in the last 72 hours. Studies/Results: No results found. Marland Kitchen atorvastatin  40 mg Oral q1800  . Chlorhexidine Gluconate Cloth  6 each Topical Q0600  . citalopram  10 mg Oral Daily  . [START ON 01/13/2021] darbepoetin (ARANESP) injection - DIALYSIS  100 mcg Intravenous Q Wed-HD  . feeding supplement (NEPRO CARB STEADY)  237 mL Oral TID BM  . heparin injection (subcutaneous)  5,000 Units Subcutaneous Q8H  . hydrALAZINE  50 mg Oral Q8H  . influenza vac split quadrivalent PF  0.5 mL Intramuscular Tomorrow-1000  . insulin aspart  0-9 Units Subcutaneous TID WC  . labetalol  200 mg Oral BID  . lactulose  20 g Oral Daily  . melatonin  5 mg Oral QHS  . multivitamin  1 tablet Oral QHS  . pantoprazole  40 mg Oral BID  . pneumococcal 23 valent vaccine  0.5 mL Intramuscular Tomorrow-1000  . polyethylene glycol  17 g Oral BID  . senna  2 tablet Oral QHS  . sodium chloride flush  3 mL Intravenous Q12H    BMET    Component Value Date/Time   NA 135 01/06/2021 2018   NA 136 08/06/2019 1400   K 3.9 01/06/2021  2018   CL 98 01/06/2021 2018   CO2 26 01/06/2021 2018   GLUCOSE 87 01/06/2021 2018   BUN 63 (H) 01/06/2021 2018   BUN 20 08/06/2019 1400   CREATININE 5.51 (H) 01/06/2021 2018   CREATININE 1.28 08/19/2015 0844   CALCIUM 8.5 (L) 01/06/2021 2018   GFRNONAA 11 (L) 01/06/2021 2018   GFRNONAA 76 12/01/2014 1037   GFRAA 33 (L) 08/06/2019 1400   GFRAA 88 12/01/2014 1037   CBC    Component Value Date/Time   WBC 7.3 01/06/2021 2018   RBC 3.90 (L) 01/06/2021 2018   HGB 9.4 (L) 01/06/2021 2018   HGB 13.4 09/04/2018 1211   HCT 31.2 (L) 01/06/2021 2018   HCT 41.1 09/04/2018 1211   PLT 232 01/06/2021 2018   PLT 190 09/04/2018 1211   MCV 80.0 01/06/2021 2018   MCV 71 (L) 09/04/2018 1211   MCH 24.1 (L) 01/06/2021 2018   MCHC 30.1 01/06/2021 2018   RDW 16.4 (H) 01/06/2021 2018   RDW 15.8 (H) 09/04/2018 1211    LYMPHSABS 1.3 12/30/2020 0136   MONOABS 0.8 12/30/2020 0136   EOSABS 0.1 12/30/2020 0136   BASOSABS 0.0 12/30/2020 0136     Assessment/Plan:  1.AKI on [id number] 2.6 in October 2021. Rapidly progressing kidney loss. Ultrasound with small kidneys. Possible renal artery stenosis based on kidney size. RIJ tunn catheter and LUE avGon 2/21 with vascular. First HD on 2/21 -Has been on HD per TTS schedulebut now MWF - Monitor for signs of recovery - seems guarded -Has been accepted to [facility]; undergoing 2 hours of dialysis today so we can switch schedule to MWF.  Plans to start outpatient dialysis on Friday  2. Hypertension/volume-malignant hypertension on arrival with flash pulmonary edema intermittently. Blood pressure improved. Discontinued renal artery duplex as still not done and HTN controlled. Doing okay on current regimen  3. AnemiaCKD - s/p feraheme on Aranesp.  Hemoglobin slowly improving.  5.A. Fib-has been controlled here recently.could be causing some variable renal perfusion-cardiology has seen   6. Hyponatremia: Significantly improved with dialysis  7. Metabolic bone disease- intact PTH 169. on sevelamer. On renal diet. Improved phos.  We will stop binder  8. Disposition-has SNF and dialysis chair outpatient.  Planning to discharge today

## 2021-01-08 LAB — GLUCOSE, CAPILLARY
Glucose-Capillary: 105 mg/dL — ABNORMAL HIGH (ref 70–99)
Glucose-Capillary: 130 mg/dL — ABNORMAL HIGH (ref 70–99)
Glucose-Capillary: 94 mg/dL (ref 70–99)
Glucose-Capillary: 161 mg/dL — ABNORMAL HIGH (ref 70–99)

## 2021-01-08 LAB — RENAL FUNCTION PANEL
Albumin: 3.1 g/dL — ABNORMAL LOW (ref 3.5–5.0)
Anion gap: 13 (ref 5–15)
BUN: 71 mg/dL — ABNORMAL HIGH (ref 8–23)
CO2: 24 mmol/L (ref 22–32)
Calcium: 8.9 mg/dL (ref 8.9–10.3)
Chloride: 97 mmol/L — ABNORMAL LOW (ref 98–111)
Creatinine, Ser: 6.25 mg/dL — ABNORMAL HIGH (ref 0.61–1.24)
GFR, Estimated: 9 mL/min — ABNORMAL LOW (ref >60)
Glucose, Bld: 108 mg/dL — ABNORMAL HIGH (ref 70–99)
Phosphorus: 6.2 mg/dL — ABNORMAL HIGH (ref 2.5–4.6)
Potassium: 4.2 mmol/L (ref 3.5–5.1)
Sodium: 134 mmol/L — ABNORMAL LOW (ref 135–145)

## 2021-01-08 LAB — CBC
HCT: 31.8 % — ABNORMAL LOW (ref 39.0–52.0)
Hemoglobin: 9.9 g/dL — ABNORMAL LOW (ref 13.0–17.0)
MCH: 24.7 pg — ABNORMAL LOW (ref 26.0–34.0)
MCHC: 31.1 g/dL (ref 30.0–36.0)
MCV: 79.3 fL — ABNORMAL LOW (ref 80.0–100.0)
Platelets: 238 10*3/uL (ref 150–400)
RBC: 4.01 MIL/uL — ABNORMAL LOW (ref 4.22–5.81)
RDW: 16.6 % — ABNORMAL HIGH (ref 11.5–15.5)
WBC: 9.3 10*3/uL (ref 4.0–10.5)
nRBC: 0 % (ref 0.0–0.2)

## 2021-01-08 MED ORDER — LIDOCAINE HCL (PF) 1 % IJ SOLN: 5.0000 mL | INTRAMUSCULAR | Status: DC | PRN

## 2021-01-08 MED ORDER — HYDRALAZINE HCL 25 MG PO TABS: 25.0000 mg | ORAL_TABLET | Freq: Three times a day (TID) | ORAL | Status: DC

## 2021-01-08 MED ORDER — HEPARIN SODIUM (PORCINE) 1000 UNIT/ML IJ SOLN
INTRAMUSCULAR | Status: AC
  Filled 2021-01-08: qty 1

## 2021-01-08 MED ORDER — PENTAFLUOROPROP-TETRAFLUOROETH EX AERO: 1.0000 "application " | INHALATION_SPRAY | CUTANEOUS | Status: DC | PRN

## 2021-01-08 MED ORDER — HEPARIN SODIUM (PORCINE) 1000 UNIT/ML DIALYSIS: 20.0000 [IU]/kg | INTRAMUSCULAR | Status: DC | PRN

## 2021-01-08 MED ORDER — LIDOCAINE-PRILOCAINE 2.5-2.5 % EX CREA: 1.0000 "application " | TOPICAL_CREAM | CUTANEOUS | Status: DC | PRN

## 2021-01-08 MED ORDER — ALTEPLASE 2 MG IJ SOLR: 2.0000 mg | Freq: Once | INTRAMUSCULAR | Status: DC | PRN

## 2021-01-08 MED ORDER — HYDRALAZINE HCL 25 MG PO TABS
25.0000 mg | ORAL_TABLET | Freq: Three times a day (TID) | ORAL | Status: DC
  Administered 2021-01-08 (×2): 25 mg via ORAL
  Filled 2021-01-08 (×2): qty 1

## 2021-01-08 NOTE — Progress Notes (Signed)
Nutrition Follow-up  DOCUMENTATION CODES:   Severe malnutrition in context of acute illness/injury  INTERVENTION:  Continue Nepro Shake po TID, each supplement provides 425 kcal and 19 grams protein.  Encourage adequate PO intake.   NUTRITION DIAGNOSIS:   Severe Malnutrition related to acute illness (SOB r/t CHF causing poor appetite) as evidenced by mild fat depletion,mild muscle depletion,moderate muscle depletion,moderate fat depletion; ongoing  GOAL:   Patient will meet greater than or equal to 90% of their needs; progressing  MONITOR:   PO intake,Supplement acceptance,Labs  REASON FOR ASSESSMENT:   Malnutrition Screening Tool    ASSESSMENT:   65 yo male admitted with increasing SOB, CHF decompensation r/t hypertensive urgency. PMH includes HTN, CKD-3b, DM-2, HLD. 2/21 tunneled HD catheter and LUE AV graft placed. Received first HD 2/21.  Meal completion has been varied from 25-100%. Pt currently has Nepro shake with varied consumption. RD to continue with current orders to aid in caloric and protein needs. Plans for pt to discharge to SNF pending insurance authorization. Recommend continuation of nutritional supplements post discharge for adequate nutrition. Labs and medications reviewed.   Diet Order:   Diet Order            Diet - low sodium heart healthy           Diet renal/carb modified with fluid restriction Fluid restriction: 1200 mL Fluid; Room service appropriate? No; Fluid consistency: Thin  Diet effective now                 EDUCATION NEEDS:   Not appropriate for education at this time  Skin:  Skin Assessment: Reviewed RN Assessment  Last BM:  3/7  Height:   Ht Readings from Last 1 Encounters:  12/17/20 '5\' 9"'$  (1.753 m)    Weight:   Wt Readings from Last 1 Encounters:  01/08/21 66.8 kg    Ideal Body Weight:  72.7 kg  BMI:  Body mass index is 21.75 kg/m.  Estimated Nutritional Needs:   Kcal:  2200-2500  Protein:  110-130  gm  Fluid:  1 L + UOP  Ruben Parker, MS, RD, LDN RD pager number/after hours weekend pager number on Amion.

## 2021-01-08 NOTE — Plan of Care (Signed)
  Problem: Education: Goal: Knowledge of General Education information will improve Description: Including pain rating scale, medication(s)/side effects and non-pharmacologic comfort measures Outcome: Adequate for Discharge   Problem: Health Behavior/Discharge Planning: Goal: Ability to manage health-related needs will improve Outcome: Adequate for Discharge   Problem: Clinical Measurements: Goal: Ability to maintain clinical measurements within normal limits will improve Outcome: Adequate for Discharge Goal: Will remain free from infection Outcome: Adequate for Discharge Goal: Diagnostic test results will improve Outcome: Adequate for Discharge Goal: Respiratory complications will improve Outcome: Adequate for Discharge Goal: Cardiovascular complication will be avoided Outcome: Adequate for Discharge   Problem: Activity: Goal: Risk for activity intolerance will decrease Outcome: Adequate for Discharge   Problem: Nutrition: Goal: Adequate nutrition will be maintained Outcome: Adequate for Discharge   Problem: Coping: Goal: Level of anxiety will decrease Outcome: Adequate for Discharge   Problem: Elimination: Goal: Will not experience complications related to bowel motility Outcome: Adequate for Discharge Goal: Will not experience complications related to urinary retention Outcome: Adequate for Discharge   Problem: Pain Managment: Goal: General experience of comfort will improve Outcome: Adequate for Discharge   Problem: Safety: Goal: Ability to remain free from injury will improve Outcome: Adequate for Discharge   Problem: Skin Integrity: Goal: Risk for impaired skin integrity will decrease Outcome: Adequate for Discharge   Problem: Education: Goal: Knowledge of disease and its progression will improve Outcome: Adequate for Discharge   Problem: Health Behavior/Discharge Planning: Goal: Ability to manage health-related needs will improve Outcome: Adequate for  Discharge   Problem: Clinical Measurements: Goal: Complications related to the disease process or treatment will be avoided or minimized Outcome: Adequate for Discharge Goal: Dialysis access will remain free of complications Outcome: Adequate for Discharge   Problem: Activity: Goal: Activity intolerance will improve Outcome: Adequate for Discharge   Problem: Fluid Volume: Goal: Fluid volume balance will be maintained or improved Outcome: Adequate for Discharge   Problem: Nutritional: Goal: Ability to make appropriate dietary choices will improve Outcome: Adequate for Discharge   Problem: Respiratory: Goal: Respiratory symptoms related to disease process will be avoided Outcome: Adequate for Discharge   Problem: Self-Concept: Goal: Body image disturbance will be avoided or minimized Outcome: Adequate for Discharge   Problem: Urinary Elimination: Goal: Progression of disease will be identified and treated Outcome: Adequate for Discharge   Problem: Education: Goal: Ability to demonstrate management of disease process will improve Outcome: Adequate for Discharge Goal: Ability to verbalize understanding of medication therapies will improve Outcome: Adequate for Discharge Goal: Individualized Educational Video(s) Outcome: Adequate for Discharge   Problem: Activity: Goal: Capacity to carry out activities will improve Outcome: Adequate for Discharge   Problem: Cardiac: Goal: Ability to achieve and maintain adequate cardiopulmonary perfusion will improve Outcome: Adequate for Discharge

## 2021-01-08 NOTE — Progress Notes (Signed)
Patient discharged, transported by [facility], in stable condition. All belongings sent with patient. PIV d/cd.

## 2021-01-08 NOTE — Progress Notes (Signed)
Patient ID: Ruben Camacho., male   DOB: 11-03-1955, 65 y.o.   MRN: [id number] S: Was planning to discharge yesterday but bed was not yet available.  Feels okay today O:BP 133/64 (BP Location: Right Arm)   Pulse 66   Temp 98.6 F (37 C) (Oral)   Resp 18   Ht '5\' 9"'$  (1.753 m)   Wt 66.8 kg   SpO2 97%   BMI 21.75 kg/m   Intake/Output Summary (Last 24 hours) at 01/08/2021 1150 Last data filed at 01/08/2021 1100 Gross per 24 hour  Intake 720 ml  Output 153 ml  Net 567 ml   Intake/Output: I/O last 3 completed shifts: In: 1140 [P.O.:1140] Out: 1950 [Urine:450; Other:1500]  Intake/Output this shift:  Total I/O In: -  Out: 3 [Urine:1; Stool:2] Weight change: -0.4 kg Gen: NAD, lying in bed CVS: Normal rate Resp: Bilateral chest rise, no increased work of breathing Abd: +BS,soft, NT/ND Ext: no edema LUE AVG +T/B  Recent Labs  Lab 01/02/21 1049 01/03/21 0342 01/05/21 0240 01/06/21 2018  NA 132* 135 134* 135  K 3.4* 5.0 4.5 3.9  CL 95* 100 102 98  CO2 24 24 20* 26  GLUCOSE 118* 159* 99 87  BUN 55* 31* 84* 63*  CREATININE 5.38* 3.71* 6.55* 5.51*  ALBUMIN 2.8* 2.8* 2.7* 3.0*  CALCIUM 8.8* 9.2 8.7* 8.5*  PHOS 4.5 3.4 3.9 4.4   Liver Function Tests: Recent Labs  Lab 01/03/21 0342 01/05/21 0240 01/06/21 2018  ALBUMIN 2.8* 2.7* 3.0*   No results for input(s): LIPASE, AMYLASE in the last 168 hours. No results for input(s): AMMONIA in the last 168 hours. CBC: Recent Labs  Lab 01/02/21 1049 01/05/21 0240 01/06/21 2018  WBC 9.3 7.6 7.3  HGB 8.7* 8.5* 9.4*  HCT 28.7* 26.5* 31.2*  MCV 78.2* 77.5* 80.0  PLT 219 PLATELET CLUMPS NOTED ON SMEAR, UNABLE TO ESTIMATE 232   Cardiac Enzymes: No results for input(s): CKTOTAL, CKMB, CKMBINDEX, TROPONINI in the last 168 hours. CBG: Recent Labs  Lab 01/07/21 0748 01/07/21 1226 01/07/21 1655 01/07/21 2053 01/08/21 0716  GLUCAP 126* 122* 151* 173* 105*    Iron Studies: No results for input(s): IRON, TIBC, TRANSFERRIN,  FERRITIN in the last 72 hours. Studies/Results: No results found. Marland Kitchen atorvastatin  40 mg Oral q1800  . Chlorhexidine Gluconate Cloth  6 each Topical Q0600  . citalopram  10 mg Oral Daily  . [START ON 01/13/2021] darbepoetin (ARANESP) injection - DIALYSIS  100 mcg Intravenous Q Wed-HD  . feeding supplement (NEPRO CARB STEADY)  237 mL Oral TID BM  . heparin injection (subcutaneous)  5,000 Units Subcutaneous Q8H  . hydrALAZINE  25 mg Oral Q8H  . influenza vac split quadrivalent PF  0.5 mL Intramuscular Tomorrow-1000  . insulin aspart  0-9 Units Subcutaneous TID WC  . labetalol  200 mg Oral BID  . lactulose  20 g Oral Daily  . melatonin  5 mg Oral QHS  . multivitamin  1 tablet Oral QHS  . pantoprazole  40 mg Oral BID  . pneumococcal 23 valent vaccine  0.5 mL Intramuscular Tomorrow-1000  . polyethylene glycol  17 g Oral BID  . senna  2 tablet Oral QHS  . sodium chloride flush  3 mL Intravenous Q12H    BMET    Component Value Date/Time   NA 135 01/06/2021 2018   NA 136 08/06/2019 1400   K 3.9 01/06/2021 2018   CL 98 01/06/2021 2018   CO2 26 01/06/2021 2018  GLUCOSE 87 01/06/2021 2018   BUN 63 (H) 01/06/2021 2018   BUN 20 08/06/2019 1400   CREATININE 5.51 (H) 01/06/2021 2018   CREATININE 1.28 08/19/2015 0844   CALCIUM 8.5 (L) 01/06/2021 2018   GFRNONAA 11 (L) 01/06/2021 2018   GFRNONAA 76 12/01/2014 1037   GFRAA 33 (L) 08/06/2019 1400   GFRAA 88 12/01/2014 1037   CBC    Component Value Date/Time   WBC 7.3 01/06/2021 2018   RBC 3.90 (L) 01/06/2021 2018   HGB 9.4 (L) 01/06/2021 2018   HGB 13.4 09/04/2018 1211   HCT 31.2 (L) 01/06/2021 2018   HCT 41.1 09/04/2018 1211   PLT 232 01/06/2021 2018   PLT 190 09/04/2018 1211   MCV 80.0 01/06/2021 2018   MCV 71 (L) 09/04/2018 1211   MCH 24.1 (L) 01/06/2021 2018   MCHC 30.1 01/06/2021 2018   RDW 16.4 (H) 01/06/2021 2018   RDW 15.8 (H) 09/04/2018 1211   LYMPHSABS 1.3 12/30/2020 0136   MONOABS 0.8 12/30/2020 0136   EOSABS 0.1  12/30/2020 0136   BASOSABS 0.0 12/30/2020 0136     Assessment/Plan:  1.AKI on [id number] 2.6 in October 2021. Rapidly progressing kidney loss. Ultrasound with small kidneys. Possible renal artery stenosis based on kidney size. RIJ tunn catheter and LUE avGon 2/21 with vascular. First HD on 2/21 -Dialysis MWF schedule - Monitor for signs of recovery - seems guarded -Has been accepted to [facility]  2. Hypertension/volume-malignant hypertension on arrival with flash pulmonary edema intermittently. Blood pressure improved. Discontinued renal artery duplex as still not done and HTN controlled. Doing okay on current regimen intermittent hypotension.  Decreasing medications as needed  3. AnemiaCKD - s/p feraheme on Aranesp.  Hemoglobin slowly improving.  5.A. Fib-has been controlled here recently.could be causing some variable renal perfusion-cardiology has seen   6. Hyponatremia: Significantly improved with dialysis  7. Metabolic bone disease- intact PTH 169. on sevelamer. On renal diet. Improved phos.  We will stop binder  8. Disposition-has SNF and dialysis chair outpatient.  Planning to discharge today

## 2021-01-08 NOTE — Progress Notes (Signed)
Renal Navigator sees that patient is still here awaiting insurance authorization for SNF. Navigator updated [facility], as plan was for him to start there today. We will do HD here in the hospital today and anticipate a Monday start in the clinic. Outpatient HD orders have already been sent to clinic. Navigator will follow up with TOC CSW/N. Rayyan. Inpatient Nephrologist/Dr. Joylene Grapes aware.   Alphonzo Cruise, [place] Renal Navigator [phone]

## 2021-01-08 NOTE — Progress Notes (Signed)
Renal Navigator appreciates update from [facility] CSW who states patient can discharge to SNF this afternoon. Navigator updated outpatient HD clinic/[facility] of discharge today and confirmed outpatient clinic start on Monday, 01/11/21.   Alphonzo Cruise, [place] Renal Navigator [phone]

## 2021-01-08 NOTE — Progress Notes (Signed)
PROGRESS NOTE        PATIENT DETAILS Name: Ruben Camacho. Age: 65 y.o. Sex: male Date of Birth: 02-21-1956 Admit Date: 12/14/2020 Admitting Physician Lequita Halt, MD [id number], Ines Bloomer, MD  Brief Narrative: Patient is a 65 y.o. male with a history of CKD stage IV, DM-2, HLD presented with shortness of breath-he was found to have pulmonary edema in the setting of worsening renal function.  Hospital course complicated by possible upper GI bleeding and blood loss anemia.  Significant events: 2/14>> admit for worsening SOB-pulmonary edema in the setting of AKI on CKD stage IV. 2/18>> GI consult for coffee-ground emesis-patient refused EGD. 2/21>> started on HD  Significant studies: 2/14>> Echo: EF 40%, global hypokinesis, grade 2 diastolic dysfunction, RVSP 26.6 2/14>> chest x-ray: Bilateral airspace disease 2/15>> chest x-ray: Bilateral pulmonary infiltrate/edema 2/15>> renal ultrasound: Chronic medical renal disease-no hydronephrosis 2/20>> Echo: EF 65-70%, severe LVH, grade 1 diastolic dysfunction  Antimicrobial therapy: None  Microbiology data: None  Procedures : 2/21>> TDC and new left upper arm AVG  Consults: Nephrology Vascular surgery GI Cardiology  DVT Prophylaxis : SCD's given UGI bleed  Subjective: Lying comfortably in bed-no major issues overnight.  Awaiting SNF bed.  Assessment/Plan: AKI on CKD stage IV: AKI likely hemodynamically mediated-on HD per nephrology.  No signs of renal recovery as of yet.  Continue to monitor.    Acute on chronic diastolic heart failure in the setting of worsening renal function: Volume status stable-diuresis with HD.  Acute hypoxic respiratory failure: Due to pulmonary edema-resolved  Upper GI bleeding: Resolved-GI consulted-patient refused EGD.  Hemoglobin stable.  Continue PPI twice daily.  Chronic microcytic anemia: Continue to follow closely-we will need outpatient endoscopic  evaluation.  Left knee pain: Due to gout flare-resolved after a short course of steroids.   Hyponatremia: Resolved  Anxiety/depression: Stable continue Celexa  Atrial tachycardia: Stable-continue labetalol-cardiology has signed off.    Minimally elevated troponins: Demand ischemia-cardiology planning on outpatient stress testing.  HTN: BP controlled-we will continue to adjust labetalol and hydralazine as needed.  Syncope: Occurred on 3/7-when patient was ambulated off the bed by PT-was hypotensive-very brief.  Have adjusted antihypertensive regimen.  Overnight was unremarkable.  Follow.  DM-2 (A1c 7.0 on 2/15): Continue SSI  Recent Labs    01/07/21 2053 01/08/21 0716 01/08/21 1211  GLUCAP 173* 105* 130*   Deconditioning/debility: PT recommending SNF.  Constipation: S/p tapwater enema-now on bowel regimen with MiraLAX/lactulose/ Senokot  Nutrition Problem: Nutrition Problem: Severe Malnutrition Etiology: acute illness (SOB r/t CHF causing poor appetite) Signs/Symptoms: mild fat depletion,mild muscle depletion,moderate muscle depletion,moderate fat depletion Interventions: Ensure Enlive (each supplement provides 350kcal and 20 grams of protein)   Diet: Diet Order            Diet - low sodium heart healthy           Diet renal/carb modified with fluid restriction Fluid restriction: 1200 mL Fluid; Room service appropriate? No; Fluid consistency: Thin  Diet effective now                  Code Status: Full code  Family Communication: Sister-Mary-[phone]-updated over the phone on 3/4  Disposition Plan: Status is: Inpatient  Remains inpatient appropriate because:Inpatient level of care appropriate due to severity of illness   Dispo: The patient is from: Home  Anticipated d/c is to: SNF              Patient currently is  medically stable to d/c.   Difficult to place patient No   Barriers to Discharge: Awaiting SNF bed  Antimicrobial  agents: Anti-infectives (From admission, onward)   Start     Dose/Rate Route Frequency Ordered Stop   12/18/20 0000  ceFAZolin (ANCEF) IVPB 1 g/50 mL premix  Status:  Discontinued       Note to Pharmacy: Send with pt to OR   1 g 100 mL/hr over 30 Minutes Intravenous On call 12/17/20 1511 12/17/20 1513   12/18/20 0000  ceFAZolin (ANCEF) IVPB 2g/100 mL premix       Note to Pharmacy: Send with pt to OR   2 g 200 mL/hr over 30 Minutes Intravenous On call 12/17/20 1513 12/19/20 0000       Time spent: 15- minutes-Greater than 50% of this time was spent in counseling, explanation of diagnosis, planning of further management, and coordination of care.  MEDICATIONS: Scheduled Meds: . atorvastatin  40 mg Oral q1800  . Chlorhexidine Gluconate Cloth  6 each Topical Q0600  . citalopram  10 mg Oral Daily  . [START ON 01/13/2021] darbepoetin (ARANESP) injection - DIALYSIS  100 mcg Intravenous Q Wed-HD  . feeding supplement (NEPRO CARB STEADY)  237 mL Oral TID BM  . heparin injection (subcutaneous)  5,000 Units Subcutaneous Q8H  . hydrALAZINE  25 mg Oral Q8H  . influenza vac split quadrivalent PF  0.5 mL Intramuscular Tomorrow-1000  . insulin aspart  0-9 Units Subcutaneous TID WC  . labetalol  200 mg Oral BID  . lactulose  20 g Oral Daily  . melatonin  5 mg Oral QHS  . multivitamin  1 tablet Oral QHS  . pantoprazole  40 mg Oral BID  . pneumococcal 23 valent vaccine  0.5 mL Intramuscular Tomorrow-1000  . polyethylene glycol  17 g Oral BID  . senna  2 tablet Oral QHS  . sodium chloride flush  3 mL Intravenous Q12H   Continuous Infusions: . sodium chloride     PRN Meds:.sodium chloride, acetaminophen, alteplase, benzonatate, bisacodyl, chlorpheniramine-HYDROcodone, guaiFENesin-dextromethorphan, heparin sodium (porcine), levalbuterol, lidocaine (PF), lidocaine-prilocaine, LORazepam, ondansetron (ZOFRAN) IV, oxyCODONE-acetaminophen, pentafluoroprop-tetrafluoroeth, sodium chloride  flush   PHYSICAL EXAM: Vital signs: Vitals:   01/07/21 0610 01/07/21 1221 01/07/21 2043 01/08/21 0440  BP: 124/62 125/65 (!) 141/71 133/64  Pulse:  84 74 66  Resp:  '17 18 18  '$ Temp:  98.4 F (36.9 C) 98.3 F (36.8 C) 98.6 F (37 C)  TempSrc:  Oral Oral Oral  SpO2: 93% 99% 98% 97%  Weight:    66.8 kg  Height:       Filed Weights   01/06/21 2205 01/07/21 0437 01/08/21 0440  Weight: 66 kg 68.1 kg 66.8 kg   Body mass index is 21.75 kg/m.   Gen Exam:Alert awake-not in any distress HEENT:atraumatic, normocephalic Chest: B/L clear to auscultation anteriorly CVS:S1S2 regular Abdomen:soft non tender, non distended Extremities: Left knee tenderness has essentially resolved. Neurology: Non focal Skin: no rash  I have personally reviewed following labs and imaging studies  LABORATORY DATA: CBC: Recent Labs  Lab 01/02/21 1049 01/05/21 0240 01/06/21 2018  WBC 9.3 7.6 7.3  HGB 8.7* 8.5* 9.4*  HCT 28.7* 26.5* 31.2*  MCV 78.2* 77.5* 80.0  PLT 219 PLATELET CLUMPS NOTED ON SMEAR, UNABLE TO ESTIMATE [id number]    Basic Metabolic Panel: Recent Labs  Lab 01/02/21 1049 01/03/21  [id number] 01/05/21 0240 01/06/21 2018  NA 132* 135 134* 135  K 3.4* 5.0 4.5 3.9  CL 95* 100 102 98  CO2 24 24 20* 26  GLUCOSE 118* 159* 99 87  BUN 55* 31* 84* 63*  CREATININE 5.38* 3.71* 6.55* 5.51*  CALCIUM 8.8* 9.2 8.7* 8.5*  PHOS 4.5 3.4 3.9 4.4    GFR: Estimated Creatinine Clearance: 12.6 mL/min (A) (by C-G formula based on SCr of 5.51 mg/dL (H)).  Liver Function Tests: Recent Labs  Lab 01/02/21 1049 01/03/21 0342 01/05/21 0240 01/06/21 2018  ALBUMIN 2.8* 2.8* 2.7* 3.0*   No results for input(s): LIPASE, AMYLASE in the last 168 hours. No results for input(s): AMMONIA in the last 168 hours.  Coagulation Profile: No results for input(s): INR, PROTIME in the last 168 hours.  Cardiac Enzymes: No results for input(s): CKTOTAL, CKMB, CKMBINDEX, TROPONINI in the last 168 hours.  BNP (last 3  results) No results for input(s): PROBNP in the last 8760 hours.  Lipid Profile: No results for input(s): CHOL, HDL, LDLCALC, TRIG, CHOLHDL, LDLDIRECT in the last 72 hours.  Thyroid Function Tests: No results for input(s): TSH, T4TOTAL, FREET4, T3FREE, THYROIDAB in the last 72 hours.  Anemia Panel: No results for input(s): VITAMINB12, FOLATE, FERRITIN, TIBC, IRON, RETICCTPCT in the last 72 hours.  Urine analysis:    Component Value Date/Time   COLORURINE STRAW (A) 12/15/2020 2334   APPEARANCEUR CLEAR 12/15/2020 2334   LABSPEC 1.009 12/15/2020 2334   PHURINE 6.0 12/15/2020 2334   GLUCOSEU 50 (A) 12/15/2020 2334   HGBUR NEGATIVE 12/15/2020 2334   BILIRUBINUR NEGATIVE 12/15/2020 2334   BILIRUBINUR negative 09/04/2018 1030   KETONESUR NEGATIVE 12/15/2020 2334   PROTEINUR 100 (A) 12/15/2020 2334   UROBILINOGEN 0.2 09/04/2018 1030   UROBILINOGEN 1.0 12/08/2014 1035   NITRITE NEGATIVE 12/15/2020 2334   LEUKOCYTESUR NEGATIVE 12/15/2020 2334    Sepsis Labs: Lactic Acid, Venous    Component Value Date/Time   LATICACIDVEN 1.0 12/18/2020 0003    MICROBIOLOGY: Recent Results (from the past 240 hour(s))  SARS CORONAVIRUS 2 (TAT 6-24 HRS) Nasopharyngeal Nasopharyngeal Swab     Status: None   Collection Time: 01/07/21  9:32 AM   Specimen: Nasopharyngeal Swab  Result Value Ref Range Status   SARS Coronavirus 2 NEGATIVE NEGATIVE Final    Comment: (NOTE) SARS-CoV-2 target nucleic acids are NOT DETECTED.  The SARS-CoV-2 RNA is generally detectable in upper and lower respiratory specimens during the acute phase of infection. Negative results do not preclude SARS-CoV-2 infection, do not rule out co-infections with other pathogens, and should not be used as the sole basis for treatment or other patient management decisions. Negative results must be combined with clinical observations, patient history, and epidemiological information. The expected result is Negative.  Fact Sheet for  Patients: SugarRoll.be  Fact Sheet for Healthcare Providers: https://www.woods-mathews.com/  This test is not yet approved or cleared by the [place] FDA and  has been authorized for detection and/or diagnosis of SARS-CoV-2 by FDA under an Emergency Use Authorization (EUA). This EUA will remain  in effect (meaning this test can be used) for the duration of the COVID-19 declaration under Se ction 564(b)(1) of the Act, 21 U.S.C. section 360bbb-3(b)(1), unless the authorization is terminated or revoked sooner.  Performed at [facility], [place] [address] [place], [place] [address]     RADIOLOGY STUDIES/RESULTS: No results found.   LOS: 25 days   Oren Binet, MD  [facility]    To  contact the attending provider between 7A-7P or the covering provider during after hours 7P-7A, please log into the web site [email or web address] and access using universal [place] password for that web site. If you do not have the password, please call the hospital operator.  01/08/2021, 12:14 PM

## 2021-01-08 NOTE — Plan of Care (Signed)
Patient is currently resting in bed. VSS. Remains on room air. No complaints overnight. Call bell within reach. Bed alarm on.   Problem: Education: Goal: Knowledge of General Education information will improve Description: Including pain rating scale, medication(s)/side effects and non-pharmacologic comfort measures Outcome: Progressing   Problem: Health Behavior/Discharge Planning: Goal: Ability to manage health-related needs will improve Outcome: Progressing   Problem: Clinical Measurements: Goal: Ability to maintain clinical measurements within normal limits will improve Outcome: Progressing Goal: Will remain free from infection Outcome: Progressing Goal: Diagnostic test results will improve Outcome: Progressing Goal: Respiratory complications will improve Outcome: Progressing Goal: Cardiovascular complication will be avoided Outcome: Progressing   Problem: Activity: Goal: Risk for activity intolerance will decrease Outcome: Progressing   Problem: Nutrition: Goal: Adequate nutrition will be maintained Outcome: Progressing   Problem: Coping: Goal: Level of anxiety will decrease Outcome: Progressing   Problem: Elimination: Goal: Will not experience complications related to bowel motility Outcome: Progressing Goal: Will not experience complications related to urinary retention Outcome: Progressing   Problem: Pain Managment: Goal: General experience of comfort will improve Outcome: Progressing   Problem: Safety: Goal: Ability to remain free from injury will improve Outcome: Progressing   Problem: Skin Integrity: Goal: Risk for impaired skin integrity will decrease Outcome: Progressing
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
## 2021-01-08 NOTE — Progress Notes (Signed)
Physical Therapy Treatment Patient Details Name: Ruben Camacho. MRN: [id number] DOB: Jan 28, 1956 Today's Date: 01/08/2021    History of Present Illness Pt is a 65 y.o. male admitted 12/14/20 with SOB; workup for acute on chronic CHF, hypertensive urgency, AKI on CKD. Pt with coffee ground emesis; GI consult, but pt declined EGD. S/p tunneled RIJ HD cath and LUE AVG placement 2/21; HD initiated 2/21. PMH includes HTN, CKD, DM, CVA, gout.    PT Comments    Pt with increased fatigue today from HD, pt reports "I'm so tired but I can't sleep". Encouraged pt to improve ability to sleep. Pt reports he needs to have a BM. PT requests pt get up to [facility]. Pt able to get to EoB and pivot to [facility] with min A, due to increased fatigue pt requires modA for returning to bed. Pt unable to stand for pericare due to weakness/fatigue, cleaned in the bed. D/c plans remain appropriate. PT will continue to follow acutely.    Follow Up Recommendations  SNF;Supervision for mobility/OOB     Equipment Recommendations  Rolling walker with 5" wheels;3in1 (PT)       Precautions / Restrictions Precautions Precautions: Fall Precaution Comments: no dizziness today, although has increased lethargy after movement Restrictions Weight Bearing Restrictions: No    Mobility  Bed Mobility Overal bed mobility: Needs Assistance Bed Mobility: Supine to Sit;Sit to Supine     Supine to sit: HOB elevated;Min assist Sit to supine: Mod assist   General bed mobility comments: min A for bringing trunk to upright, increased effort for all movement today, after using BSC pt returned to EoB and immediately leaned trunk to bed and required mod A for bringing LE back into bed    Transfers Overall transfer level: Needs assistance Equipment used: 1 person hand held assist Transfers: Sit to/from [employer] Sit to Stand: Min assist;Mod assist Stand pivot transfers: Min assist Squat pivot transfers: Mod assist      General transfer comment: min A for stand from bed and pivot to [facility], increased cuing for sequencing and hand placement, modA for squat pivot transfer back to bed, pt unable to stand for pericare, cleaned once back in bed        Balance Overall balance assessment: Needs assistance Sitting-balance support: Feet supported Sitting balance-Leahy Scale: Fair       Standing balance-Leahy Scale: Poor Standing balance comment: requires B UE for balance                            Cognition Arousal/Alertness: Lethargic Behavior During Therapy: Flat affect Overall Cognitive Status: No family/caregiver present to determine baseline cognitive functioning                                 General Comments: pt report being very tired but not able to sleep, eyes closed most of session         General Comments General comments (skin integrity, edema, etc.): pt's BP WFL for positional change today, encouraged pt at very least to get up to [facility]      Pertinent Vitals/Pain Pain Assessment: No/denies pain           PT Goals (current goals can now be found in the care plan section) Acute Rehab PT Goals PT Goal Formulation: With patient Time For Goal Achievement: 01/07/21 Potential to Achieve Goals: Good  Frequency    Min 2X/week      PT Plan Current plan remains appropriate       AM-PAC PT "6 Clicks" Mobility   Outcome Measure  Help needed turning from your back to your side while in a flat bed without using bedrails?: None Help needed moving from lying on your back to sitting on the side of a flat bed without using bedrails?: None Help needed moving to and from a bed to a chair (including a wheelchair)?: A Little Help needed standing up from a chair using your arms (e.g., wheelchair or bedside chair)?: A Little Help needed to walk in hospital room?: A Little Help needed climbing 3-5 steps with a railing? : A Lot 6 Click Score: 19    End of Session  Equipment Utilized During Treatment: Gait belt Activity Tolerance: Patient tolerated treatment well;Patient limited by fatigue Patient left: with call bell/phone within reach;with bed alarm set;in bed Nurse Communication: Mobility status PT Visit Diagnosis: Difficulty in walking, not elsewhere classified (R26.2);Dizziness and giddiness (R42)     Time: [id number] PT Time Calculation (min) (ACUTE ONLY): 26 min  Charges:  $Therapeutic Activity: 23-37 mins                     Elizabeth B. Migdalia Dk PT, DPT Acute Rehabilitation Services Pager [phone] Office [phone]    [place] 01/08/2021, 12:44 PM

## 2021-01-19 ENCOUNTER — Encounter: Payer: Self-pay | Admitting: Cardiovascular Disease

## 2021-01-29 ENCOUNTER — Ambulatory Visit: Payer: Medicare (Managed Care) | Admitting: Cardiovascular Disease

## 2021-04-28 ENCOUNTER — Other Ambulatory Visit: Payer: Self-pay | Admitting: Emergency Medicine

## 2021-04-28 DIAGNOSIS — I129 Hypertensive chronic kidney disease with stage 1 through stage 4 chronic kidney disease, or unspecified chronic kidney disease: Secondary | ICD-10-CM

## 2021-04-28 DIAGNOSIS — E1122 Type 2 diabetes mellitus with diabetic chronic kidney disease: Secondary | ICD-10-CM

## 2022-08-29 ENCOUNTER — Ambulatory Visit (INDEPENDENT_AMBULATORY_CARE_PROVIDER_SITE_OTHER): Payer: Medicare Other | Admitting: Surgery

## 2022-08-29 ENCOUNTER — Encounter: Payer: Self-pay | Admitting: Surgery

## 2022-08-29 VITALS — BP 172/85 | HR 68 | Temp 98.0°F | Resp 20 | Ht 69.0 in | Wt 161.0 lb

## 2022-08-29 DIAGNOSIS — N186 End stage renal disease: Secondary | ICD-10-CM

## 2022-08-29 DIAGNOSIS — Z992 Dependence on renal dialysis: Secondary | ICD-10-CM

## 2022-08-29 NOTE — Progress Notes (Signed)
Vascular and Vein Specialist of [place]  Patient name: Ruben Camacho. MRN: [id number] DOB: [birth year] Sex: male   REQUESTING PROVIDER:    Dr. Joelyn Oms   REASON FOR CONSULT:    Dialysis access  HISTORY OF PRESENT ILLNESS:   Ruben Redner. is a 66 y.o. male, who is status post left upper arm AVGG by Dr. Scot Dock on 12-21-2020.  He is here for evaluation of peritoneal dialysis.  He has a history of HTN and diabetes.  He is right-handed.  He has not had any prior abdominal surgery.  He has been approved for home dialysis.  PAST MEDICAL HISTORY    Past Medical History:  Diagnosis Date   Chronic kidney disease    Gout    1-2 exacerbations per year.  Takes Advil PRN for pain.   Hyperlipidemia    Hypertension    Migraine    Stroke ([place]) 07/01/2012   L sided weakness, slurred speech.  Admission [facility].     Vitamin D deficiency 08/09/2019     FAMILY HISTORY   Family History  Problem Relation Age of Onset   Stroke Mother    Hypertension Mother    Hypertension Daughter    Cancer Maternal Grandmother        lung   Healthy Father        Died in motorcycle accident    SOCIAL HISTORY:   Social History   Socioeconomic History   Marital status: Married    Spouse name: Not on file   Number of children: 2   Years of education: Masters   Highest education level: Not on file  Occupational History   Occupation: PRINCIPAL    Employer: [employer]  Tobacco Use   Smoking status: Never   Smokeless tobacco: Never  Substance and Sexual Activity   Alcohol use: No    Alcohol/week: 0.0 standard drinks of alcohol    Comment: former drinker   Drug use: No   Sexual activity: Yes    Partners: Female  Other Topics Concern   Not on file  Social History Narrative   Marital status: married      CHildren;  2 children; no grandchildren.      Lives at home alone.      Employment:  Pharmacist, hospital; principal in past.      Tobacco:  none      Alcohol: weekends      Exercise:  Walking; weightlifting; basketball.   Right handed.   2 cups caffeine/day.   Social Determinants of Health   Financial Resource Strain: Medium Risk (12/15/2020)   Overall Financial Resource Strain (CARDIA)    Difficulty of Paying Living Expenses: Somewhat hard  Food Insecurity: No Food Insecurity (12/15/2020)   Hunger Vital Sign    Worried About Running Out of Food in the Last Year: Never true    Ran Out of Food in the Last Year: Never true  Transportation Needs: No Transportation Needs (12/15/2020)   PRAPARE - Hydrologist (Medical): No    Lack of Transportation (Non-Medical): No  Physical Activity: Inactive (12/15/2020)   Exercise Vital Sign    Days of Exercise per Week: 0 days    Minutes of Exercise per Session: 0 min  Stress: Not on file  Social Connections: Not on file  Intimate Partner Violence: Not on file    ALLERGIES:    No Known Allergies  CURRENT MEDICATIONS:    Current Outpatient Medications  Medication Sig  Dispense Refill   ferric citrate (AURYXIA) 1 GM 210 MG(Fe) tablet Take by mouth.     hydrALAZINE (APRESOLINE) 50 MG tablet Take 50 mg by mouth every 8 (eight) hours.     insulin aspart (NOVOLOG) 100 UNIT/ML injection 0-9 Units, Subcutaneous, 3 times daily with meals CBG < 70: Implement Hypoglycemia measures CBG 70 - 120: 0 units CBG 121 - 150: 1 unit CBG 151 - 200: 2 units CBG 201 - 250: 3 units CBG 251 - 300: 5 units CBG 301 - 350: 7 units CBG 351 - 400: 9 units CBG > 400: call MD 10 mL 11   losartan (COZAAR) 25 MG tablet Take 25 mg by mouth daily.     melatonin 5 MG TABS Take 1 tablet (5 mg total) by mouth at bedtime.  0   Nutritional Supplements (FEEDING SUPPLEMENT, NEPRO CARB STEADY,) LIQD Take 237 mLs by mouth 3 (three) times daily between meals.  0   pantoprazole (PROTONIX) 40 MG tablet Take 1 tablet (40 mg total) by mouth 2 (two) times daily.     polyethylene glycol (MIRALAX  / GLYCOLAX) 17 g packet Take 17 g by mouth 2 (two) times daily. 14 each 0   senna (SENOKOT) 8.6 MG TABS tablet Take 2 tablets (17.2 mg total) by mouth at bedtime. 120 tablet 0   No current facility-administered medications for this visit.    REVIEW OF SYSTEMS:   [X]  denotes positive finding, [ ]  denotes negative finding Cardiac  Comments:  Chest pain or chest pressure:    Shortness of breath upon exertion:    Short of breath when lying flat:    Irregular heart rhythm:        Vascular    Pain in calf, thigh, or hip brought on by ambulation:    Pain in feet at night that wakes you up from your sleep:     Blood clot in your veins:    Leg swelling:         Pulmonary    Oxygen at home:    Productive cough:     Wheezing:         Neurologic    Sudden weakness in arms or legs:     Sudden numbness in arms or legs:     Sudden onset of difficulty speaking or slurred speech:    Temporary loss of vision in one eye:     Problems with dizziness:         Gastrointestinal    Blood in stool:      Vomited blood:         Genitourinary    Burning when urinating:     Blood in urine:        Psychiatric    Major depression:         Hematologic    Bleeding problems:    Problems with blood clotting too easily:        Skin    Rashes or ulcers:        Constitutional    Fever or chills:     PHYSICAL EXAM:   Vitals:   08/29/22 0810  BP: (!) 172/85  Pulse: 68  Resp: 20  Temp: 98 F (36.7 C)  SpO2: 98%  Weight: 161 lb (73 kg)  Height: 5\' 9"  (1.753 m)    GENERAL: The patient is a well-nourished male, in no acute distress. The vital signs are documented above. CARDIAC: There is a regular rate and rhythm.  VASCULAR:  Excellent thrill within left upper arm graft PULMONARY: Nonlabored respirations ABDOMEN: Soft and non-tender MUSCULOSKELETAL: There are no major deformities or cyanosis. NEUROLOGIC: No focal weakness or paresthesias are detected. SKIN: There are no ulcers or  rashes noted. PSYCHIATRIC: The patient has a normal affect.  STUDIES:      ASSESSMENT and PLAN   End-stage renal disease: The patient is desiring to transition to peritoneal dialysis.  He has not had any prior abdominal surgery.  He is right-handed.  I discussed placing a laparoscopic peritoneal dialysis catheter.  The details of the procedure were discussed with the patient.  We talked about the risks and benefits.  He is eager to proceed.  I will get this done on a nondialysis day, likely a Thursday   Annamarie Major, IV, MD, FACS Vascular and Vein Specialists of [facility] [phone] Pager [phone]

## 2022-09-01 ENCOUNTER — Other Ambulatory Visit: Payer: Self-pay

## 2022-09-01 DIAGNOSIS — N186 End stage renal disease: Secondary | ICD-10-CM

## 2022-09-28 ENCOUNTER — Encounter (HOSPITAL_COMMUNITY): Payer: Self-pay | Admitting: Surgery

## 2022-09-28 ENCOUNTER — Other Ambulatory Visit: Payer: Self-pay

## 2022-09-28 NOTE — Progress Notes (Signed)
PCP - Dr. Joelyn Oms  Cardiologist - Denies  EP- Denies  Endocrine- Denies  Pulm- Denies  Chest x-ray - Denies  EKG - 09/29/22- Day of surgery  Stress Test - Denies  ECHO - 12/20/20 (E)  Cardiac Cath - Denies  AICD-na PM-na LOOP-na  Nerve Stimulator- Denies  Dialysis- M-W-F- Pt states he had a full session today 09/28/22  Sleep Study - Denies CPAP - Denies  LABS- 09/29/22: I-Stat 8  ASA- Denies  ERAS- No  HA1C- 12/15/20 (E): 7.0 Fasting Blood Sugar - 0 Checks Blood Sugar ___0__ times a day- Pt states he does not have diabetes and he does not check levels at home, bu the pt has a documented history.  Anesthesia- No  Pt denies having chest pain, sob, or fever during the pre-op phone call. All instructions explained to the pt, with a verbal understanding of the material. Pt also instructed to wear a mask and social distance if he goes out. The opportunity to ask questions was provided.

## 2022-09-28 NOTE — Progress Notes (Signed)
S.D.W- Instructions   Your procedure is scheduled on Thurs., Nov. 30, 2023 from 7:30AM-8:48AM.  Report to [facility] Main Entrance "A" at 5:30 A.M., then check in with the Admitting office.  Call this number if you have problems the morning of surgery:  [phone]             If you experience any cold or flu symptoms such as cough, fever, chills, shortness of breath, etc. between now and your scheduled surgery, please notify us at the above         number.  Remember:  Do not eat or drink after midnight on Nov. 29th    Take these medicines the morning of surgery with A SIP OF WATER:  HydrALAZINE (APRESOLINE)   As of today, STOP taking any Aspirin (unless otherwise instructed by your surgeon) Aleve, Naproxen, Ibuprofen, Motrin, Advil, Goody's, BC's, all herbal medications, fish oil, and all vitamins.          Do not wear jewelr. Do not wear lotions, powders, cologne or deodorant. Do not shave 48 hours prior to surgery.  Men may shave face and neck. Do not bring valuables to the hospital.  [facility] is not responsible for any belongings or valuables.    Do NOT Smoke (Tobacco/Vaping)  24 hours prior to your procedure  If you use a CPAP at night, you may bring your mask for your overnight stay.   Contacts, glasses, hearing aids, dentures or partials may not be worn into surgery, please bring cases for these belongings   For patients admitted to the hospital, discharge time will be determined by your treatment team.   Patients discharged the day of surgery will not be allowed to drive home, and someone needs to stay with them for 24 hours.  Special instructions:    Oral Hygiene is also important to reduce your risk of infection.  Remember - BRUSH YOUR TEETH THE MORNING OF SURGERY WITH YOUR REGULAR TOOTHPASTE  [place]- Preparing For Surgery  Before surgery, you can play an important role. Because skin is not sterile, your skin needs to be as free of germs as possible. You can  reduce the number of germs on your skin by washing with Antibacterial Soap before surgery.     Please follow these instructions carefully.     Shower the NIGHT BEFORE SURGERY and the MORNING OF SURGERY with Antibacterial Soap.   Pat yourself dry with a CLEAN TOWEL.  Wear CLEAN PAJAMAS to bed the night before surgery  Place CLEAN SHEETS on your bed the night before your surgery  DO NOT SLEEP WITH PETS.  Day of Surgery:  Take a shower with Antibacterial soap. Wear Clean/Comfortable clothing the morning of surgery Do not apply any deodorants/lotions.   Remember to brush your teeth WITH YOUR REGULAR TOOTHPASTE.   If you test positive for Covid, or been in contact with anyone that has tested positive in the last 10 days, please notify your surgeon.  SURGICAL WAITING ROOM VISITATION Patients having surgery or a procedure may have no more than 2 support people in the waiting area - these visitors may rotate.   Children under the age of 46 must have an adult with them who is not the patient. If the patient needs to stay at the hospital during part of their recovery, the visitor guidelines for inpatient rooms apply. Pre-op nurse will coordinate an appropriate time for 1 support person to accompany patient in pre-op.  This support person may not  rotate.   Please refer to the [facility] website for the visitor guidelines for Inpatients (after your surgery is over and you are in a regular room).

## 2022-09-29 ENCOUNTER — Other Ambulatory Visit: Payer: Self-pay

## 2022-09-29 ENCOUNTER — Encounter (HOSPITAL_COMMUNITY)
Admission: RE | Disposition: A | Discharge status: Home / Self Care | Payer: Self-pay | Source: Home / Self Care | Attending: Surgery

## 2022-09-29 ENCOUNTER — Ambulatory Visit (HOSPITAL_BASED_OUTPATIENT_CLINIC_OR_DEPARTMENT_OTHER): Payer: Medicare Other | Admitting: Registered Nurse

## 2022-09-29 ENCOUNTER — Observation Stay (HOSPITAL_COMMUNITY)
Admission: RE | Admit: 2022-09-29 | Discharge: 2022-09-30 | Disposition: A | Discharge status: Home / Self Care | Payer: Medicare Other | Attending: Surgery | Admitting: Surgery

## 2022-09-29 ENCOUNTER — Ambulatory Visit (HOSPITAL_COMMUNITY): Payer: Medicare Other | Admitting: Registered Nurse

## 2022-09-29 DIAGNOSIS — E1122 Type 2 diabetes mellitus with diabetic chronic kidney disease: Secondary | ICD-10-CM | POA: Diagnosis not present

## 2022-09-29 DIAGNOSIS — Z8673 Personal history of transient ischemic attack (TIA), and cerebral infarction without residual deficits: Secondary | ICD-10-CM | POA: Insufficient documentation

## 2022-09-29 DIAGNOSIS — I120 Hypertensive chronic kidney disease with stage 5 chronic kidney disease or end stage renal disease: Secondary | ICD-10-CM | POA: Diagnosis not present

## 2022-09-29 DIAGNOSIS — N186 End stage renal disease: Secondary | ICD-10-CM | POA: Diagnosis not present

## 2022-09-29 DIAGNOSIS — N185 Chronic kidney disease, stage 5: Secondary | ICD-10-CM

## 2022-09-29 DIAGNOSIS — Z79899 Other long term (current) drug therapy: Secondary | ICD-10-CM | POA: Insufficient documentation

## 2022-09-29 DIAGNOSIS — D631 Anemia in chronic kidney disease: Secondary | ICD-10-CM

## 2022-09-29 DIAGNOSIS — Z992 Dependence on renal dialysis: Secondary | ICD-10-CM | POA: Diagnosis not present

## 2022-09-29 HISTORY — PX: CAPD INSERTION: SHX5233

## 2022-09-29 HISTORY — DX: Type 2 diabetes mellitus without complications: E11.9

## 2022-09-29 LAB — HIV ANTIBODY (ROUTINE TESTING W REFLEX): HIV Screen 4th Generation wRfx: NONREACTIVE

## 2022-09-29 LAB — CBC
HCT: 40.8 % (ref 39.0–52.0)
Hemoglobin: 12.4 g/dL — ABNORMAL LOW (ref 13.0–17.0)
MCH: 22.8 pg — ABNORMAL LOW (ref 26.0–34.0)
MCHC: 30.4 g/dL (ref 30.0–36.0)
MCV: 75.1 fL — ABNORMAL LOW (ref 80.0–100.0)
Platelets: 204 10*3/uL (ref 150–400)
RBC: 5.43 MIL/uL (ref 4.22–5.81)
RDW: 15.9 % — ABNORMAL HIGH (ref 11.5–15.5)
WBC: 8.3 10*3/uL (ref 4.0–10.5)
nRBC: 0 % (ref 0.0–0.2)

## 2022-09-29 LAB — POCT I-STAT, CHEM 8
BUN: 30 mg/dL — ABNORMAL HIGH (ref 8–23)
Calcium, Ion: 1.1 mmol/L — ABNORMAL LOW (ref 1.15–1.40)
Chloride: 96 mmol/L — ABNORMAL LOW (ref 98–111)
Creatinine, Ser: 5 mg/dL — ABNORMAL HIGH (ref 0.61–1.24)
Glucose, Bld: 96 mg/dL (ref 70–99)
HCT: 35 % — ABNORMAL LOW (ref 39.0–52.0)
Hemoglobin: 11.9 g/dL — ABNORMAL LOW (ref 13.0–17.0)
Potassium: 3.7 mmol/L (ref 3.5–5.1)
Sodium: 138 mmol/L (ref 135–145)
TCO2: 33 mmol/L — ABNORMAL HIGH (ref 22–32)

## 2022-09-29 LAB — CREATININE, SERUM
Creatinine, Ser: 5 mg/dL — ABNORMAL HIGH (ref 0.61–1.24)
GFR, Estimated: 12 mL/min — ABNORMAL LOW (ref >60)

## 2022-09-29 SURGERY — LAPAROSCOPIC INSERTION CONTINUOUS AMBULATORY PERITONEAL DIALYSIS  (CAPD) CATHETER
Anesthesia: General

## 2022-09-29 MED ORDER — SUCCINYLCHOLINE CHLORIDE 200 MG/10ML IV SOSY
PREFILLED_SYRINGE | INTRAVENOUS | Status: AC
  Filled 2022-09-29: qty 10

## 2022-09-29 MED ORDER — PROPOFOL 10 MG/ML IV BOLUS
INTRAVENOUS | Status: DC | PRN
  Administered 2022-09-29: 120 mg via INTRAVENOUS

## 2022-09-29 MED ORDER — MIDAZOLAM HCL 2 MG/2ML IJ SOLN
INTRAMUSCULAR | Status: AC
  Filled 2022-09-29: qty 2

## 2022-09-29 MED ORDER — ONDANSETRON HCL 4 MG/2ML IJ SOLN
INTRAMUSCULAR | Status: DC | PRN
  Administered 2022-09-29: 4 mg via INTRAVENOUS

## 2022-09-29 MED ORDER — SODIUM CHLORIDE 0.9% FLUSH: 3.0000 mL | INTRAVENOUS | Status: DC | PRN

## 2022-09-29 MED ORDER — PHENYLEPHRINE 80 MCG/ML (10ML) SYRINGE FOR IV PUSH (FOR BLOOD PRESSURE SUPPORT)
PREFILLED_SYRINGE | INTRAVENOUS | Status: AC
  Filled 2022-09-29: qty 10

## 2022-09-29 MED ORDER — ORAL CARE MOUTH RINSE: 15.0000 mL | Freq: Once | OROMUCOSAL | Status: AC

## 2022-09-29 MED ORDER — LIDOCAINE 2% (20 MG/ML) 5 ML SYRINGE
INTRAMUSCULAR | Status: DC | PRN
  Administered 2022-09-29: 100 mg via INTRAVENOUS

## 2022-09-29 MED ORDER — CHLORHEXIDINE GLUCONATE 0.12 % MT SOLN
15.0000 mL | Freq: Once | OROMUCOSAL | Status: AC
  Administered 2022-09-29: 15 mL via OROMUCOSAL
  Filled 2022-09-29: qty 15

## 2022-09-29 MED ORDER — SENNOSIDES-DOCUSATE SODIUM 8.6-50 MG PO TABS: 1.0000 | ORAL_TABLET | Freq: Every evening | ORAL | Status: DC | PRN

## 2022-09-29 MED ORDER — SODIUM CHLORIDE 0.9 % IV SOLN: INTRAVENOUS | Status: DC

## 2022-09-29 MED ORDER — DEXAMETHASONE SODIUM PHOSPHATE 10 MG/ML IJ SOLN
INTRAMUSCULAR | Status: DC | PRN
  Administered 2022-09-29: 5 mg via INTRAVENOUS

## 2022-09-29 MED ORDER — PHENOL 1.4 % MT LIQD: 1.0000 | OROMUCOSAL | Status: DC | PRN

## 2022-09-29 MED ORDER — BUPIVACAINE-EPINEPHRINE (PF) 0.5% -1:200000 IJ SOLN
INTRAMUSCULAR | Status: AC
  Filled 2022-09-29: qty 30

## 2022-09-29 MED ORDER — BUPIVACAINE-EPINEPHRINE (PF) 0.5% -1:200000 IJ SOLN
INTRAMUSCULAR | Status: DC | PRN
  Administered 2022-09-29: 7 mL

## 2022-09-29 MED ORDER — OXYCODONE HCL 5 MG PO TABS
5.0000 mg | ORAL_TABLET | ORAL | Status: DC | PRN
  Administered 2022-09-29: 5 mg via ORAL
  Administered 2022-09-30: 10 mg via ORAL
  Filled 2022-09-29: qty 2
  Filled 2022-09-29: qty 1

## 2022-09-29 MED ORDER — SODIUM CHLORIDE 0.9 % IV SOLN: 250.0000 mL | INTRAVENOUS | Status: DC | PRN

## 2022-09-29 MED ORDER — FERRIC CITRATE 1 GM 210 MG(FE) PO TABS
420.0000 mg | ORAL_TABLET | Freq: Three times a day (TID) | ORAL | Status: DC
  Administered 2022-09-29 – 2022-09-30 (×2): 420 mg via ORAL
  Filled 2022-09-29 (×4): qty 2

## 2022-09-29 MED ORDER — HYDRALAZINE HCL 50 MG PO TABS
50.0000 mg | ORAL_TABLET | Freq: Three times a day (TID) | ORAL | Status: DC
  Administered 2022-09-29 – 2022-09-30 (×3): 50 mg via ORAL
  Filled 2022-09-29 (×3): qty 1

## 2022-09-29 MED ORDER — ACETAMINOPHEN 325 MG RE SUPP: 325.0000 mg | RECTAL | Status: DC | PRN

## 2022-09-29 MED ORDER — HEPARIN SODIUM (PORCINE) 5000 UNIT/ML IJ SOLN
5000.0000 [IU] | Freq: Three times a day (TID) | INTRAMUSCULAR | Status: DC
  Administered 2022-09-29 – 2022-09-30 (×3): 5000 [IU] via SUBCUTANEOUS
  Filled 2022-09-29 (×3): qty 1

## 2022-09-29 MED ORDER — GUAIFENESIN-DM 100-10 MG/5ML PO SYRP: 15.0000 mL | ORAL_SOLUTION | ORAL | Status: DC | PRN

## 2022-09-29 MED ORDER — DEXAMETHASONE SODIUM PHOSPHATE 10 MG/ML IJ SOLN
INTRAMUSCULAR | Status: AC
  Filled 2022-09-29: qty 1

## 2022-09-29 MED ORDER — SODIUM CHLORIDE 0.9 % IR SOLN
Status: DC | PRN
  Administered 2022-09-29: 1000 mL

## 2022-09-29 MED ORDER — LOSARTAN POTASSIUM 25 MG PO TABS
25.0000 mg | ORAL_TABLET | Freq: Every day | ORAL | Status: DC
  Administered 2022-09-30: 25 mg via ORAL
  Filled 2022-09-29: qty 1

## 2022-09-29 MED ORDER — OXYCODONE HCL 5 MG/5ML PO SOLN: 5.0000 mg | Freq: Once | ORAL | Status: DC | PRN

## 2022-09-29 MED ORDER — ONDANSETRON HCL 4 MG/2ML IJ SOLN: 4.0000 mg | Freq: Four times a day (QID) | INTRAMUSCULAR | Status: DC | PRN

## 2022-09-29 MED ORDER — CEFAZOLIN SODIUM-DEXTROSE 2-4 GM/100ML-% IV SOLN
2.0000 g | INTRAVENOUS | Status: AC
  Administered 2022-09-29: 2 g via INTRAVENOUS
  Filled 2022-09-29: qty 100

## 2022-09-29 MED ORDER — ACETAMINOPHEN 10 MG/ML IV SOLN: 1000.0000 mg | Freq: Once | INTRAVENOUS | Status: DC | PRN

## 2022-09-29 MED ORDER — CHLORHEXIDINE GLUCONATE CLOTH 2 % EX PADS
6.0000 | MEDICATED_PAD | Freq: Once | CUTANEOUS | Status: AC
  Administered 2022-09-29: 6 via TOPICAL

## 2022-09-29 MED ORDER — POTASSIUM CHLORIDE CRYS ER 20 MEQ PO TBCR: 20.0000 meq | EXTENDED_RELEASE_TABLET | Freq: Once | ORAL | Status: DC

## 2022-09-29 MED ORDER — ALUM & MAG HYDROXIDE-SIMETH 200-200-20 MG/5ML PO SUSP: 15.0000 mL | ORAL | Status: DC | PRN

## 2022-09-29 MED ORDER — PROPOFOL 10 MG/ML IV BOLUS
INTRAVENOUS | Status: AC
  Filled 2022-09-29: qty 20

## 2022-09-29 MED ORDER — DOCUSATE SODIUM 100 MG PO CAPS
100.0000 mg | ORAL_CAPSULE | Freq: Two times a day (BID) | ORAL | Status: DC
  Administered 2022-09-29 – 2022-09-30 (×2): 100 mg via ORAL
  Filled 2022-09-29 (×2): qty 1

## 2022-09-29 MED ORDER — HYDRALAZINE HCL 20 MG/ML IJ SOLN: 5.0000 mg | INTRAMUSCULAR | Status: DC | PRN

## 2022-09-29 MED ORDER — PHENYLEPHRINE 80 MCG/ML (10ML) SYRINGE FOR IV PUSH (FOR BLOOD PRESSURE SUPPORT)
PREFILLED_SYRINGE | INTRAVENOUS | Status: DC | PRN
  Administered 2022-09-29 (×3): 80 ug via INTRAVENOUS

## 2022-09-29 MED ORDER — FENTANYL CITRATE (PF) 250 MCG/5ML IJ SOLN
INTRAMUSCULAR | Status: AC
  Filled 2022-09-29: qty 5

## 2022-09-29 MED ORDER — METOPROLOL TARTRATE 5 MG/5ML IV SOLN: 2.0000 mg | INTRAVENOUS | Status: DC | PRN

## 2022-09-29 MED ORDER — ONDANSETRON HCL 4 MG/2ML IJ SOLN
INTRAMUSCULAR | Status: AC
  Filled 2022-09-29: qty 2

## 2022-09-29 MED ORDER — ONDANSETRON HCL 4 MG/2ML IJ SOLN: 4.0000 mg | Freq: Once | INTRAMUSCULAR | Status: DC | PRN

## 2022-09-29 MED ORDER — ROCURONIUM BROMIDE 10 MG/ML (PF) SYRINGE
PREFILLED_SYRINGE | INTRAVENOUS | Status: DC | PRN
  Administered 2022-09-29: 20 mg via INTRAVENOUS
  Administered 2022-09-29: 50 mg via INTRAVENOUS

## 2022-09-29 MED ORDER — ROCURONIUM BROMIDE 10 MG/ML (PF) SYRINGE
PREFILLED_SYRINGE | INTRAVENOUS | Status: AC
  Filled 2022-09-29: qty 10

## 2022-09-29 MED ORDER — 0.9 % SODIUM CHLORIDE (POUR BTL) OPTIME
TOPICAL | Status: DC | PRN
  Administered 2022-09-29: 1000 mL

## 2022-09-29 MED ORDER — ACETAMINOPHEN 325 MG PO TABS: 325.0000 mg | ORAL_TABLET | ORAL | Status: DC | PRN

## 2022-09-29 MED ORDER — FENTANYL CITRATE (PF) 250 MCG/5ML IJ SOLN
INTRAMUSCULAR | Status: DC | PRN
  Administered 2022-09-29: 100 ug via INTRAVENOUS

## 2022-09-29 MED ORDER — OXYCODONE HCL 5 MG PO TABS: 5.0000 mg | ORAL_TABLET | Freq: Once | ORAL | Status: DC | PRN

## 2022-09-29 MED ORDER — FENTANYL CITRATE (PF) 100 MCG/2ML IJ SOLN: 25.0000 ug | INTRAMUSCULAR | Status: DC | PRN

## 2022-09-29 MED ORDER — SUCCINYLCHOLINE CHLORIDE 200 MG/10ML IV SOSY
PREFILLED_SYRINGE | INTRAVENOUS | Status: DC | PRN
  Administered 2022-09-29: 110 mg via INTRAVENOUS

## 2022-09-29 MED ORDER — LABETALOL HCL 5 MG/ML IV SOLN: 10.0000 mg | INTRAVENOUS | Status: DC | PRN

## 2022-09-29 MED ORDER — OXYCODONE HCL 5 MG PO TABS: 5.0000 mg | ORAL_TABLET | ORAL | 0 refills | Status: DC | PRN

## 2022-09-29 MED ORDER — MIDAZOLAM HCL 2 MG/2ML IJ SOLN
INTRAMUSCULAR | Status: DC | PRN
  Administered 2022-09-29: 2 mg via INTRAVENOUS

## 2022-09-29 MED ORDER — CHLORHEXIDINE GLUCONATE CLOTH 2 % EX PADS: 6.0000 | MEDICATED_PAD | Freq: Once | CUTANEOUS | Status: DC

## 2022-09-29 MED ORDER — SODIUM CHLORIDE 0.9% FLUSH
3.0000 mL | Freq: Two times a day (BID) | INTRAVENOUS | Status: DC
  Administered 2022-09-29: 3 mL via INTRAVENOUS

## 2022-09-29 MED ORDER — SUGAMMADEX SODIUM 200 MG/2ML IV SOLN
INTRAVENOUS | Status: DC | PRN
  Administered 2022-09-29 (×2): 100 mg via INTRAVENOUS

## 2022-09-29 MED ORDER — LIDOCAINE 2% (20 MG/ML) 5 ML SYRINGE
INTRAMUSCULAR | Status: AC
  Filled 2022-09-29: qty 5

## 2022-09-29 MED ORDER — PANTOPRAZOLE SODIUM 40 MG PO TBEC
40.0000 mg | DELAYED_RELEASE_TABLET | Freq: Every day | ORAL | Status: DC
  Administered 2022-09-29 – 2022-09-30 (×2): 40 mg via ORAL
  Filled 2022-09-29 (×2): qty 1

## 2022-09-29 SURGICAL SUPPLY — 55 items
ADAPTER TITANIUM MEDIONICS (MISCELLANEOUS) ×1 IMPLANT
ADH SKN CLS APL DERMABOND .7 (GAUZE/BANDAGES/DRESSINGS) ×1
ADPR DLYS CATH STRL LF DISP (MISCELLANEOUS) ×1
APL PRP STRL LF DISP 70% ISPRP (MISCELLANEOUS) ×1
APPLIER CLIP 5 13 M/L LIGAMAX5 (MISCELLANEOUS)
APR CLP MED LRG 5 ANG JAW (MISCELLANEOUS)
BAG DECANTER FOR FLEXI CONT (MISCELLANEOUS) ×1 IMPLANT
BIOPATCH RED 1 DISK 7.0 (GAUZE/BANDAGES/DRESSINGS) ×1 IMPLANT
BLADE CLIPPER SURG (BLADE) IMPLANT
BLADE SURG 11 STRL SS (BLADE) ×1 IMPLANT
CATH EXTENDED DIALYSIS (CATHETERS) ×1 IMPLANT
CHLORAPREP W/TINT 26 (MISCELLANEOUS) ×1 IMPLANT
CLIP APPLIE 5 13 M/L LIGAMAX5 (MISCELLANEOUS) IMPLANT
COVER SURGICAL LIGHT HANDLE (MISCELLANEOUS) ×1 IMPLANT
DERMABOND ADVANCED .7 DNX12 (GAUZE/BANDAGES/DRESSINGS) ×1 IMPLANT
DEVICE TROCAR PUNCTURE CLOSURE (ENDOMECHANICALS) ×1 IMPLANT
DRSG TEGADERM 4X4.75 (GAUZE/BANDAGES/DRESSINGS) ×3 IMPLANT
ELECT REM PT RETURN 9FT ADLT (ELECTROSURGICAL) ×1
ELECTRODE REM PT RTRN 9FT ADLT (ELECTROSURGICAL) ×1 IMPLANT
GAUZE SPONGE 4X4 12PLY STRL (GAUZE/BANDAGES/DRESSINGS) ×1 IMPLANT
GLOVE INDICATOR 6.5 STRL GRN (GLOVE) ×1 IMPLANT
GLOVE SURG SS PI 7.5 STRL IVOR (GLOVE) ×2 IMPLANT
GOWN STRL REUS W/ TWL LRG LVL3 (GOWN DISPOSABLE) ×2 IMPLANT
GOWN STRL REUS W/ TWL XL LVL3 (GOWN DISPOSABLE) ×1 IMPLANT
GOWN STRL REUS W/TWL LRG LVL3 (GOWN DISPOSABLE) ×2
GOWN STRL REUS W/TWL XL LVL3 (GOWN DISPOSABLE) ×1
GRASPER SUT TROCAR 14GX15 (MISCELLANEOUS) ×1 IMPLANT
IV NS 1000ML (IV SOLUTION) ×1
IV NS 1000ML BAXH (IV SOLUTION) ×1 IMPLANT
KIT BASIN OR (CUSTOM PROCEDURE TRAY) ×1 IMPLANT
KIT TURNOVER KIT B (KITS) ×1 IMPLANT
NDL INSUFFLATION 14GA 120MM (NEEDLE) IMPLANT
NEEDLE INSUFFLATION 14GA 120MM (NEEDLE) IMPLANT
NS IRRIG 1000ML POUR BTL (IV SOLUTION) ×1 IMPLANT
PAD ARMBOARD 7.5X6 YLW CONV (MISCELLANEOUS) ×2 IMPLANT
SET CYSTO W/LG BORE CLAMP LF (SET/KITS/TRAYS/PACK) ×1 IMPLANT
SET EXT 12IN DIALYSIS STAY-SAF (MISCELLANEOUS) ×1 IMPLANT
SET IRRIG TUBING LAPAROSCOPIC (IRRIGATION / IRRIGATOR) IMPLANT
SET TUBE SMOKE EVAC HIGH FLOW (TUBING) ×1 IMPLANT
SLEEVE ENDOPATH XCEL 5M (ENDOMECHANICALS) ×2 IMPLANT
SPIKE FLUID TRANSFER (MISCELLANEOUS) ×1 IMPLANT
STYLET FALLER (MISCELLANEOUS) ×1 IMPLANT
STYLET FALLER MEDIONICS (MISCELLANEOUS) ×1 IMPLANT
SUT MNCRL AB 4-0 PS2 18 (SUTURE) ×2 IMPLANT
SUT PROLENE 0 SH 30 (SUTURE) ×2 IMPLANT
SUT SILK 0 TIES 10X30 (SUTURE) ×1 IMPLANT
SUT VICRYL 3 0 (SUTURE) IMPLANT
TOWEL GREEN STERILE (TOWEL DISPOSABLE) ×1 IMPLANT
TOWEL GREEN STERILE FF (TOWEL DISPOSABLE) ×1 IMPLANT
TRAY LAPAROSCOPIC MC (CUSTOM PROCEDURE TRAY) ×1 IMPLANT
TROCAR 5MMX150MM (TROCAR) IMPLANT
TROCAR XCEL BLADELESS 5X75MML (TROCAR) ×1 IMPLANT
TROCAR XCEL NON-BLD 11X100MML (ENDOMECHANICALS) IMPLANT
TROCAR XCEL NON-BLD 5MMX100MML (ENDOMECHANICALS) ×1 IMPLANT
WATER STERILE IRR 1000ML POUR (IV SOLUTION) ×1 IMPLANT

## 2022-09-29 NOTE — H&P (Signed)
Vascular and Vein Specialist of [place]   Patient name: Ruben Camacho.      MRN: [id number]        DOB: [birth year]          Sex: male     REQUESTING PROVIDER:     Dr. Joelyn Oms     REASON FOR CONSULT:    Dialysis access   HISTORY OF PRESENT ILLNESS:    Ruben Camacho. is a 66 y.o. male, who is status post left upper arm AVGG by Dr. Scot Dock on 12-21-2020.  He is here for evaluation of peritoneal dialysis.  He has a history of HTN and diabetes.  He is right-handed.  He has not had any prior abdominal surgery.  He has been approved for home dialysis.   PAST MEDICAL HISTORY          Past Medical History:  Diagnosis Date   Chronic kidney disease     Gout      1-2 exacerbations per year.  Takes Advil PRN for pain.   Hyperlipidemia     Hypertension     Migraine     Stroke ([place]) 07/01/2012    L sided weakness, slurred speech.  Admission [facility].     Vitamin D deficiency 08/09/2019        FAMILY HISTORY         Family History  Problem Relation Age of Onset   Stroke Mother     Hypertension Mother     Hypertension Daughter     Cancer Maternal Grandmother          lung   Healthy Father          Died in motorcycle accident      SOCIAL HISTORY:    Social History         Socioeconomic History   Marital status: Married      Spouse name: Not on file   Number of children: 2   Years of education: Masters   Highest education level: Not on file  Occupational History   Occupation: PRINCIPAL      Employer: [employer]  Tobacco Use   Smoking status: Never   Smokeless tobacco: Never  Substance and Sexual Activity   Alcohol use: No      Alcohol/week: 0.0 standard drinks of alcohol      Comment: former drinker   Drug use: No   Sexual activity: Yes      Partners: Female  Other Topics Concern   Not on file  Social History Narrative    Marital status: married       CHildren;  2 children; no grandchildren.       Lives at home  alone.       Employment:  Pharmacist, hospital; principal in past.       Tobacco: none       Alcohol: weekends       Exercise:  Walking; weightlifting; basketball.    Right handed.    2 cups caffeine/day.    Social Determinants of Health        Financial Resource Strain: Medium Risk (12/15/2020)    Overall Financial Resource Strain (CARDIA)     Difficulty of Paying Living Expenses: Somewhat hard  Food Insecurity: No Food Insecurity (12/15/2020)    Hunger Vital Sign     Worried About Running Out of Food in the Last Year: Never true     Ran Out of Food in the Last Year: Never  true  Transportation Needs: No Transportation Needs (12/15/2020)    PRAPARE - Armed forces logistics/support/administrative officer (Medical): No     Lack of Transportation (Non-Medical): No  Physical Activity: Inactive (12/15/2020)    Exercise Vital Sign     Days of Exercise per Week: 0 days     Minutes of Exercise per Session: 0 min  Stress: Not on file  Social Connections: Not on file  Intimate Partner Violence: Not on file      ALLERGIES:      No Known Allergies   CURRENT MEDICATIONS:            Current Outpatient Medications  Medication Sig Dispense Refill   ferric citrate (AURYXIA) 1 GM 210 MG(Fe) tablet Take by mouth.       hydrALAZINE (APRESOLINE) 50 MG tablet Take 50 mg by mouth every 8 (eight) hours.       insulin aspart (NOVOLOG) 100 UNIT/ML injection 0-9 Units, Subcutaneous, 3 times daily with meals CBG < 70: Implement Hypoglycemia measures CBG 70 - 120: 0 units CBG 121 - 150: 1 unit CBG 151 - 200: 2 units CBG 201 - 250: 3 units CBG 251 - 300: 5 units CBG 301 - 350: 7 units CBG 351 - 400: 9 units CBG > 400: call MD 10 mL 11   losartan (COZAAR) 25 MG tablet Take 25 mg by mouth daily.       melatonin 5 MG TABS Take 1 tablet (5 mg total) by mouth at bedtime.   0   Nutritional Supplements (FEEDING SUPPLEMENT, NEPRO CARB STEADY,) LIQD Take 237 mLs by mouth 3 (three) times daily between meals.   0   pantoprazole  (PROTONIX) 40 MG tablet Take 1 tablet (40 mg total) by mouth 2 (two) times daily.       polyethylene glycol (MIRALAX / GLYCOLAX) 17 g packet Take 17 g by mouth 2 (two) times daily. 14 each 0   senna (SENOKOT) 8.6 MG TABS tablet Take 2 tablets (17.2 mg total) by mouth at bedtime. 120 tablet 0    No current facility-administered medications for this visit.      REVIEW OF SYSTEMS:    [X]  denotes positive finding, [ ]  denotes negative finding Cardiac   Comments:  Chest pain or chest pressure:      Shortness of breath upon exertion:      Short of breath when lying flat:      Irregular heart rhythm:             Vascular      Pain in calf, thigh, or hip brought on by ambulation:      Pain in feet at night that wakes you up from your sleep:       Blood clot in your veins:      Leg swelling:              Pulmonary      Oxygen at home:      Productive cough:       Wheezing:              Neurologic      Sudden weakness in arms or legs:       Sudden numbness in arms or legs:       Sudden onset of difficulty speaking or slurred speech:      Temporary loss of vision in one eye:       Problems with dizziness:  Gastrointestinal      Blood in stool:         Vomited blood:              Genitourinary      Burning when urinating:       Blood in urine:             Psychiatric      Major depression:              Hematologic      Bleeding problems:      Problems with blood clotting too easily:             Skin      Rashes or ulcers:             Constitutional      Fever or chills:        PHYSICAL EXAM:       Vitals:    08/29/22 0810  BP: (!) 172/85  Pulse: 68  Resp: 20  Temp: 98 F (36.7 C)  SpO2: 98%  Weight: 161 lb (73 kg)  Height: 5\' 9"  (1.753 m)      GENERAL: The patient is a well-nourished male, in no acute distress. The vital signs are documented above. CARDIAC: There is a regular rate and rhythm.  VASCULAR: Excellent thrill within left upper arm  graft PULMONARY: Nonlabored respirations ABDOMEN: Soft and non-tender MUSCULOSKELETAL: There are no major deformities or cyanosis. NEUROLOGIC: No focal weakness or paresthesias are detected. SKIN: There are no ulcers or rashes noted. PSYCHIATRIC: The patient has a normal affect.   STUDIES:          ASSESSMENT and PLAN    End-stage renal disease: The patient is desiring to transition to peritoneal dialysis.  He has not had any prior abdominal surgery.  He is right-handed.  I discussed placing a laparoscopic peritoneal dialysis catheter.  The details of the procedure were discussed with the patient.  We talked about the risks and benefits.  He is eager to proceed.  I will get this done on a nondialysis day, likely a Thursday     Annamarie Major, IV, MD, FACS Vascular and Vein Specialists of [facility] [phone] Pager [phone]

## 2022-09-29 NOTE — Progress Notes (Signed)
Mobility Specialist - Progress Note   09/29/22 1658  Mobility  Activity Ambulated with assistance in hallway  Level of Assistance Standby assist, set-up cues, supervision of patient - no hands on  Assistive Device Front wheel walker  Distance Ambulated (ft) 300 ft  Activity Response Tolerated well  $Mobility charge 1 Mobility    Pt received in bed agreeable to mobility. No complaints throughout. Left sitting EOB w/ call bell in reach and all needs met.   Long Specialist Please contact via SecureChat or Rehab office at [phone]

## 2022-09-29 NOTE — Progress Notes (Signed)
Pt admitted to 6N04 from PACU, alert and oriented and in no pain at present.

## 2022-09-29 NOTE — Anesthesia Postprocedure Evaluation (Signed)
Anesthesia Post Note  Patient: Ruben Camacho.  Procedure(s) Performed: LAPAROSCOPIC INSERTION CONTINUOUS AMBULATORY PERITONEAL DIALYSIS  (CAPD) CATHETER     Patient location during evaluation: PACU Anesthesia Type: General Level of consciousness: awake and alert Pain management: pain level controlled Vital Signs Assessment: post-procedure vital signs reviewed and stable Respiratory status: spontaneous breathing, nonlabored ventilation, respiratory function stable and patient connected to nasal cannula oxygen Cardiovascular status: blood pressure returned to baseline and stable Postop Assessment: no apparent nausea or vomiting Anesthetic complications: no  No notable events documented.  Last Vitals:  Vitals:   09/29/22 0930 09/29/22 0945  BP: (!) 143/68 (!) 147/75  Pulse: 76 76  Resp: 18 16  Temp:  36.8 C  SpO2: 95% 93%    Last Pain:  Vitals:   09/29/22 0915  TempSrc:   PainSc: 0-No pain                 Evany Schecter S

## 2022-09-29 NOTE — Transfer of Care (Signed)
Immediate Anesthesia Transfer of Care Note  Patient: Ruben Camacho.  Procedure(s) Performed: LAPAROSCOPIC INSERTION CONTINUOUS AMBULATORY PERITONEAL DIALYSIS  (CAPD) CATHETER  Patient Location: PACU  Anesthesia Type:General  Level of Consciousness: awake, alert , and oriented  Airway & Oxygen Therapy: Patient Spontanous Breathing and Patient connected to nasal cannula oxygen  Post-op Assessment: Report given to RN and Post -op Vital signs reviewed and stable  Post vital signs: Reviewed and stable  Last Vitals:  Vitals Value Taken Time  BP 157/76 09/29/22 0916  Temp    Pulse 81 09/29/22 0919  Resp 26 09/29/22 0919  SpO2 97 % 09/29/22 0919  Vitals shown include unvalidated device data.  Last Pain:  Vitals:   09/29/22 0613  TempSrc: Oral  PainSc:       Patients Stated Pain Goal: 3 (10/18/74 8832)  Complications: No notable events documented.

## 2022-09-29 NOTE — Anesthesia Preprocedure Evaluation (Signed)
Anesthesia Evaluation  Patient identified by MRN, date of birth, ID band Patient awake    Reviewed: Allergy & Precautions, H&P , NPO status , Patient's Chart, lab work & pertinent test results  Airway Mallampati: III  TM Distance: <3 FB Neck ROM: Full    Dental no notable dental hx.    Pulmonary neg pulmonary ROS   Pulmonary exam normal breath sounds clear to auscultation       Cardiovascular hypertension, Normal cardiovascular exam Rhythm:Regular Rate:Normal  Compared to echo from 12/14/20 LVEF has improved.   2. Left ventricular ejection fraction, by estimation, is 65 to 70%. The  left ventricle has normal function. The left ventricle has no regional  wall motion abnormalities. There is severe left ventricular hypertrophy.  Left ventricular diastolic parameters   are consistent with Grade I diastolic dysfunction (impaired relaxation).   3. Right ventricular systolic function is normal. The right ventricular  size is normal.   4. Left atrial size was mildly dilated.   5. The mitral valve is normal in structure. Mild mitral valve  regurgitation.   6. The aortic valve is normal in structure. Aortic valve regurgitation is  not visualized.     Neuro/Psych negative neurological ROS  negative psych ROS   GI/Hepatic negative GI ROS, Neg liver ROS,,,  Endo/Other  diabetes, Type 2    Renal/GU DialysisRenal disease  negative genitourinary   Musculoskeletal negative musculoskeletal ROS (+)    Abdominal   Peds negative pediatric ROS (+)  Hematology  (+) Blood dyscrasia, anemia   Anesthesia Other Findings   Reproductive/Obstetrics negative OB ROS                             Anesthesia Physical Anesthesia Plan  ASA: 3  Anesthesia Plan: General   Post-op Pain Management: Minimal or no pain anticipated   Induction: Intravenous  PONV Risk Score and Plan: 2 and Ondansetron, Dexamethasone and  Treatment may vary due to age or medical condition  Airway Management Planned: Oral ETT  Additional Equipment:   Intra-op Plan:   Post-operative Plan: Extubation in OR  Informed Consent: I have reviewed the patients History and Physical, chart, labs and discussed the procedure including the risks, benefits and alternatives for the proposed anesthesia with the patient or authorized representative who has indicated his/her understanding and acceptance.     Dental advisory given  Plan Discussed with: CRNA and Surgeon  Anesthesia Plan Comments:        Anesthesia Quick Evaluation

## 2022-09-29 NOTE — Anesthesia Procedure Notes (Signed)
Procedure Name: Intubation Date/Time: 09/29/2022 7:46 AM  Performed by: Trinna Post., CRNAPre-anesthesia Checklist: Patient identified, Emergency Drugs available, Suction available, Patient being monitored and Timeout performed Patient Re-evaluated:Patient Re-evaluated prior to induction Oxygen Delivery Method: Circle system utilized Preoxygenation: Pre-oxygenation with 100% oxygen Induction Type: IV induction, Rapid sequence and Cricoid Pressure applied Grade View: Grade II Tube type: Oral Tube size: 7.5 mm Number of attempts: 1 Airway Equipment and Method: Stylet Placement Confirmation: ETT inserted through vocal cords under direct vision, positive ETCO2 and breath sounds checked- equal and bilateral Secured at: 23 cm Tube secured with: Tape Dental Injury: Teeth and Oropharynx as per pre-operative assessment

## 2022-09-29 NOTE — Op Note (Signed)
    Patient name: Ruben Camacho. MRN: [id number] DOB: [birth year] Sex: male  09/29/2022 Pre-operative Diagnosis: End-stage renal disease Post-operative diagnosis:  Same Surgeon:  Annamarie Major Assistants: Georgie Chard, RNFA Procedure:   Laparoscopic placement of peritoneal dialysis catheter Anesthesia:  General Blood Loss:  minimal Specimens:  none  Indications: This is a 66 year old gentleman who has been recommended for peritoneal dialysis.  Details of the catheter placement were discussed with the patient and he wished to proceed.  Procedure:  The patient was identified in the holding area and taken to [place] [facility]  The patient was then placed supine on the table. general anesthesia was administered.  The patient was prepped and draped in the usual sterile fashion.  A time out was called and antibiotics were administered.  1% lidocaine was used for the port sites.  A skin nick was made in the right upper quadrant several fingerbreadths below the costal margin.  A 5 mm Optiview was used to gain access into the peritoneum.  The abdomen was then insufflated.  A second 5 mm port was placed in the right lower quadrant.  I then selected the catheter and determine the appropriate placement.  A periumbilical incision was then made and a another 5 mm port was tunneled inferiorly and then entered into the abdomen under direct vision.  The catheter was inserted into the abdomen and the cuff was withdrawn until it was just above the fascia.  The port was then removed.  The catheter was cut the appropriate length and connected to the metal connector which was tied down with a Prolene suture.  A subxiphoid incision was then made and the catheter was tunneled through the incision and cuff placed at this level.  I then tunneled out to the left lateral side in the mid axillary line several fingerbreadths below the costal margin and brought the catheter out and situated the cuff of the skin exit site.  I then  inspected the abdomen.  There is no obvious complications.  There was no omentum to speak of for omentopexy.  The catheter was situated appropriately in the inferior pelvis.  The catheter was then connected to a saline bag and via gravity 500 cc of saline were easily infused.  The saline bag was then placed on the floor and we drained off several 100 cc.  The catheter was then connected to the port.  The abdomen was desufflated.  All ports were then removed.  The skin incisions were closed with 4 Monocryl and Dermabond.  There were no immediate complications.   Disposition: To PACU stable.   Theotis Burrow, M.D., [facility] [place] Office: [phone] Pager:  [phone]

## 2022-09-30 ENCOUNTER — Encounter (HOSPITAL_COMMUNITY): Payer: Self-pay | Admitting: Surgery

## 2022-09-30 DIAGNOSIS — N186 End stage renal disease: Secondary | ICD-10-CM | POA: Diagnosis not present

## 2022-09-30 MED ORDER — CHLORHEXIDINE GLUCONATE CLOTH 2 % EX PADS: 6.0000 | MEDICATED_PAD | Freq: Every day | CUTANEOUS | Status: DC

## 2022-09-30 NOTE — Progress Notes (Signed)
  Progress Note    09/30/2022 8:31 AM 1 Day Post-Op  Subjective:  no complaints   Vitals:   09/30/22 0456 09/30/22 0748  BP: (!) 140/70 (!) 153/71  Pulse: 71 72  Resp: 17 16  Temp: 98.2 F (36.8 C) 97.9 F (36.6 C)  SpO2: 96% 97%   Physical Exam: Cardiac:  regular Lungs:  non labored Incisions:  incisions are intact and well appearing Extremities: well perfused and warm Abdomen:  soft, non tender, non distended Neurologic: alert and oriented  CBC    Component Value Date/Time   WBC 8.3 09/29/2022 1402   RBC 5.43 09/29/2022 1402   HGB 12.4 (L) 09/29/2022 1402   HGB 13.4 09/04/2018 1211   HCT 40.8 09/29/2022 1402   HCT 41.1 09/04/2018 1211   PLT 204 09/29/2022 1402   PLT 190 09/04/2018 1211   MCV 75.1 (L) 09/29/2022 1402   MCV 71 (L) 09/04/2018 1211   MCH 22.8 (L) 09/29/2022 1402   MCHC 30.4 09/29/2022 1402   RDW 15.9 (H) 09/29/2022 1402   RDW 15.8 (H) 09/04/2018 1211   LYMPHSABS 1.3 12/30/2020 0136   MONOABS 0.8 12/30/2020 0136   EOSABS 0.1 12/30/2020 0136   BASOSABS 0.0 12/30/2020 0136    BMET    Component Value Date/Time   NA 138 09/29/2022 0610   NA 136 08/06/2019 1400   K 3.7 09/29/2022 0610   CL 96 (L) 09/29/2022 0610   CO2 24 01/08/2021 1239   GLUCOSE 96 09/29/2022 0610   BUN 30 (H) 09/29/2022 0610   BUN 20 08/06/2019 1400   CREATININE 5.00 (H) 09/29/2022 1402   CREATININE 1.28 08/19/2015 0844   CALCIUM 8.9 01/08/2021 1239   GFRNONAA 12 (L) 09/29/2022 1402   GFRNONAA 76 12/01/2014 1037   GFRAA 33 (L) 08/06/2019 1400   GFRAA 88 12/01/2014 1037    INR    Component Value Date/Time   INR 1.0 08/25/2020 0759     Intake/Output Summary (Last 24 hours) at 09/30/2022 0831 Last data filed at 09/30/2022 0604 Gross per 24 hour  Intake 640 ml  Output 10 ml  Net 630 ml     Assessment/Plan:  66 y.o. male is s/p PD catheter placement 1 Day Post-Op   Doing well postop Pain well managed Incisions are all intact and well appearing Abdomen is  soft, non distended Tolerating diet, voiding without difficulty Functioning LUE AV graft Has outpatient HD arranged on Monday Okay for discharge home today He will follow up with [place] as needed   Karoline Caldwell, PA-C [facility] [phone] 09/30/2022 8:31 AM

## 2022-09-30 NOTE — Progress Notes (Signed)
[name] - Progress Note   09/30/22 0944  Mobility  Activity Ambulated independently in hallway  Level of Assistance Independent  Assistive Device None  Distance Ambulated (ft) 300 ft  Activity Response Tolerated well  $Mobility charge 1 Mobility    Pt received in bed agreeable to mobility. Left in bed w/ call bell in reach and all needs met.   Ruben Camacho Please contact via SecureChat or Rehab office at [phone]

## 2022-09-30 NOTE — Plan of Care (Signed)
  Problem: Education: Goal: Knowledge of General Education information will improve Description: Including pain rating scale, medication(s)/side effects and non-pharmacologic comfort measures Outcome: Progressing   Problem: Coping: Goal: Level of anxiety will decrease Outcome: Progressing   Problem: Safety: Goal: Ability to remain free from injury will improve Outcome: Progressing   

## 2022-10-06 NOTE — Discharge Summary (Signed)
Discharge Summary    Ruben Camacho. 1956/09/29 66 y.o. male  [id number]  Admission Date: 09/29/2022  Discharge Date: 09/30/2022  Physician: Napoleon Form  Admission Diagnosis: ESRD (end stage renal disease) [facility]) [N18.6]   HPI:   This is a 66 y.o. male who is status post left upper arm AVGG by Dr. Scot Dock on 12-21-2020.  He is here for evaluation of peritoneal dialysis.  He has a history of HTN and diabetes.  He is right-handed.  He has not had any prior abdominal surgery.  He has been approved for home dialysis.   Hospital Course:  The patient was admitted to the hospital and taken to the operating room on 09/29/2022 and underwent: Laparoscopic placement of peritoneal dialysis catheter by Dr. Trula Slade  The pt tolerated the procedure well and was transported to the PACU in excellent condition.   Patient did well overnight. Minimal incisional pain.  Incisions all intact. Abdomen soft. Tolerating diet. Voiding without difficulty. He has functioning left AV graft. He remained stable for discharge home. He will follow up with [place] as needed. Outpatient HD arranged for 10/03/22.    CBC    Component Value Date/Time   WBC 8.3 09/29/2022 1402   RBC 5.43 09/29/2022 1402   HGB 12.4 (L) 09/29/2022 1402   HGB 13.4 09/04/2018 1211   HCT 40.8 09/29/2022 1402   HCT 41.1 09/04/2018 1211   PLT 204 09/29/2022 1402   PLT 190 09/04/2018 1211   MCV 75.1 (L) 09/29/2022 1402   MCV 71 (L) 09/04/2018 1211   MCH 22.8 (L) 09/29/2022 1402   MCHC 30.4 09/29/2022 1402   RDW 15.9 (H) 09/29/2022 1402   RDW 15.8 (H) 09/04/2018 1211   LYMPHSABS 1.3 12/30/2020 0136   MONOABS 0.8 12/30/2020 0136   EOSABS 0.1 12/30/2020 0136   BASOSABS 0.0 12/30/2020 0136    BMET    Component Value Date/Time   NA 138 09/29/2022 0610   NA 136 08/06/2019 1400   K 3.7 09/29/2022 0610   CL 96 (L) 09/29/2022 0610   CO2 24 01/08/2021 1239   GLUCOSE 96 09/29/2022 0610   BUN 30 (H) 09/29/2022 0610   BUN 20  08/06/2019 1400   CREATININE 5.00 (H) 09/29/2022 1402   CREATININE 1.28 08/19/2015 0844   CALCIUM 8.9 01/08/2021 1239   GFRNONAA 12 (L) 09/29/2022 1402   GFRNONAA 76 12/01/2014 1037   GFRAA 33 (L) 08/06/2019 1400   GFRAA 88 12/01/2014 1037      Discharge Instructions     Call MD for:  redness, tenderness, or signs of infection (pain, swelling, bleeding, redness, odor or green/yellow discharge around incision site)   Complete by: As directed    Call MD for:  severe or increased pain, loss or decreased feeling  in affected limb(s)   Complete by: As directed    Call MD for:  temperature >100.5   Complete by: As directed    Discharge patient   Complete by: As directed    Discharge disposition: 01-Home or Self Care   Discharge patient date: 09/30/2022   Resume previous diet   Complete by: As directed        Discharge Diagnosis:  ESRD (end stage renal disease) ([place]) [N18.6]  Secondary Diagnosis: Patient Active Problem List   Diagnosis Date Noted   ESRD (end stage renal disease) ([place]) 09/29/2022   Protein-calorie malnutrition, severe 12/15/2020   CHF (congestive heart failure) ([place]) 12/14/2020   Hypertensive emergency    Stage 3b chronic kidney  disease ([place]) 10/14/2019   Vitamin D deficiency 08/09/2019   Type 2 diabetes mellitus without complication, without long-term current use of insulin ([place]) 07/25/2019   AKI (acute kidney injury) ([place]) 06/11/2019   Hypertension associated with chronic kidney disease due to type 2 diabetes mellitus ([place]) 11/30/2011   Hyperlipemia 11/30/2011   Gout 11/30/2011   Beta thalassemia ([place]) 11/30/2011   Past Medical History:  Diagnosis Date   Chronic kidney disease    M-W-F   Diabetes mellitus without complication (HCC)    Type II- does not take medicines or check levels at home.   Gout    1-2 exacerbations per year.  Takes Advil PRN for pain.   Hyperlipidemia    Hypertension    Migraine    Stroke ([place]) 07/01/2012   L sided weakness,  slurred speech.  Admission [facility].     Vitamin D deficiency 08/09/2019     Allergies as of 09/30/2022   No Known Allergies      Medication List     TAKE these medications    Auryxia 1 GM 210 MG(Fe) tablet Generic drug: ferric citrate Take 420 mg by mouth 3 (three) times daily with meals.   hydrALAZINE 50 MG tablet Commonly known as: APRESOLINE Take 50 mg by mouth every 8 (eight) hours.   losartan 25 MG tablet Commonly known as: COZAAR Take 25 mg by mouth daily.   oxyCODONE 5 MG immediate release tablet Commonly known as: Roxicodone Take 1 tablet (5 mg total) by mouth every 4 (four) hours as needed for severe pain.         Instructions: [facility] Discharge Instructions AV Fistula or Graft Surgery for Dialysis Access  Please refer to the following instructions for your post-procedure care. Your surgeon or physician assistant will discuss any changes with you.  Activity  You may drive the day following your surgery, if you are comfortable and no longer taking prescription pain medication. Resume full activity as the soreness in your incision resolves.  Bathing/Showering  You may shower after you go home. Keep your incision dry for 48 hours. Do not soak in a bathtub, hot tub, or swim until the incision heals completely. You may not shower if you have a hemodialysis catheter.  Incision Care  Clean your incision with mild soap and water after 48 hours. Pat the area dry with a clean towel. You do not need a bandage unless otherwise instructed. Do not apply any ointments or creams to your incision. You may have skin glue on your incision. Do not peel it off. It will come off on its own in about one week. Your arm may swell a bit after surgery. To reduce swelling use pillows to elevate your arm so it is above your heart. Your doctor will tell you if you need to lightly wrap your arm with an ACE bandage.  Diet  Resume your  normal diet. There are not special food restrictions following this procedure. In order to heal from your surgery, it is CRITICAL to get adequate nutrition. Your body requires vitamins, minerals, and protein. Vegetables are the best source of vitamins and minerals. Vegetables also provide the perfect balance of protein. Processed food has little nutritional value, so try to avoid this.  Medications  Resume taking all of your medications. If your incision is causing pain, you may take over-the counter pain relievers such as acetaminophen (Tylenol). If you were prescribed a stronger pain medication, please be aware these  medications can cause nausea and constipation. Prevent nausea by taking the medication with a snack or meal. Avoid constipation by drinking plenty of fluids and eating foods with high amount of fiber, such as fruits, vegetables, and grains. Do not take Tylenol if you are taking prescription pain medications.  Follow up Your surgeon may want to see you in the office following your access surgery. If so, this will be arranged at the time of your surgery.  Please call us immediately for any of the following conditions:  Increased pain, redness, drainage (pus) from your incision site Fever of 101 degrees or higher Severe or worsening pain at your incision site Hand pain or numbness.  Reduce your risk of vascular disease:  Stop smoking. If you would like help, call [facility] at 1-800-QUIT-NOW [phone]) or [place] at [place] your cholesterol Maintain a desired weight Control your diabetes Keep your blood pressure down  Dialysis  It will take several weeks to several months for your new dialysis access to be ready for use. Your surgeon will determine when it is OK to use it. Your nephrologist will continue to direct your dialysis. You can continue to use your Permcath until your new access is ready for use.   10/06/2022 [name]. [id number] 1956-04-09  Surgeon(s): Serafina Mitchell, MD  Procedure(s): LAPAROSCOPIC INSERTION CONTINUOUS AMBULATORY PERITONEAL DIALYSIS  (CAPD) CATHETER  If you have any questions, please call the office at [phone].  Prescriptions given: Roxicet #8 No Refill  Disposition: Home  Patient's condition: is Good  Follow up: 1. [facility] as needed if he has any new or concerning symptoms   Paulo Fruit, PA-C [facility] [phone] 10/06/2022  2:04 PM

## 2022-10-31 DIAGNOSIS — N2581 Secondary hyperparathyroidism of renal origin: Secondary | ICD-10-CM | POA: Diagnosis not present

## 2022-10-31 DIAGNOSIS — D631 Anemia in chronic kidney disease: Secondary | ICD-10-CM | POA: Diagnosis not present

## 2022-10-31 DIAGNOSIS — K769 Liver disease, unspecified: Secondary | ICD-10-CM | POA: Diagnosis not present

## 2022-10-31 DIAGNOSIS — Z4932 Encounter for adequacy testing for peritoneal dialysis: Secondary | ICD-10-CM | POA: Diagnosis not present

## 2022-10-31 DIAGNOSIS — N186 End stage renal disease: Secondary | ICD-10-CM | POA: Diagnosis not present

## 2022-10-31 DIAGNOSIS — Z992 Dependence on renal dialysis: Secondary | ICD-10-CM | POA: Diagnosis not present

## 2022-10-31 DIAGNOSIS — E119 Type 2 diabetes mellitus without complications: Secondary | ICD-10-CM | POA: Diagnosis not present

## 2022-11-01 DIAGNOSIS — K769 Liver disease, unspecified: Secondary | ICD-10-CM | POA: Diagnosis not present

## 2022-11-01 DIAGNOSIS — Z4932 Encounter for adequacy testing for peritoneal dialysis: Secondary | ICD-10-CM | POA: Diagnosis not present

## 2022-11-01 DIAGNOSIS — D631 Anemia in chronic kidney disease: Secondary | ICD-10-CM | POA: Diagnosis not present

## 2022-11-01 DIAGNOSIS — Z992 Dependence on renal dialysis: Secondary | ICD-10-CM | POA: Diagnosis not present

## 2022-11-01 DIAGNOSIS — N186 End stage renal disease: Secondary | ICD-10-CM | POA: Diagnosis not present

## 2022-11-01 DIAGNOSIS — N2581 Secondary hyperparathyroidism of renal origin: Secondary | ICD-10-CM | POA: Diagnosis not present

## 2022-11-01 DIAGNOSIS — E119 Type 2 diabetes mellitus without complications: Secondary | ICD-10-CM | POA: Diagnosis not present

## 2022-11-02 DIAGNOSIS — Z4932 Encounter for adequacy testing for peritoneal dialysis: Secondary | ICD-10-CM | POA: Diagnosis not present

## 2022-11-02 DIAGNOSIS — N186 End stage renal disease: Secondary | ICD-10-CM | POA: Diagnosis not present

## 2022-11-02 DIAGNOSIS — E119 Type 2 diabetes mellitus without complications: Secondary | ICD-10-CM | POA: Diagnosis not present

## 2022-11-02 DIAGNOSIS — Z992 Dependence on renal dialysis: Secondary | ICD-10-CM | POA: Diagnosis not present

## 2022-11-02 DIAGNOSIS — D631 Anemia in chronic kidney disease: Secondary | ICD-10-CM | POA: Diagnosis not present

## 2022-11-02 DIAGNOSIS — K769 Liver disease, unspecified: Secondary | ICD-10-CM | POA: Diagnosis not present

## 2022-11-02 DIAGNOSIS — N2581 Secondary hyperparathyroidism of renal origin: Secondary | ICD-10-CM | POA: Diagnosis not present

## 2022-11-03 DIAGNOSIS — D631 Anemia in chronic kidney disease: Secondary | ICD-10-CM | POA: Diagnosis not present

## 2022-11-03 DIAGNOSIS — E119 Type 2 diabetes mellitus without complications: Secondary | ICD-10-CM | POA: Diagnosis not present

## 2022-11-03 DIAGNOSIS — N2581 Secondary hyperparathyroidism of renal origin: Secondary | ICD-10-CM | POA: Diagnosis not present

## 2022-11-03 DIAGNOSIS — Z4932 Encounter for adequacy testing for peritoneal dialysis: Secondary | ICD-10-CM | POA: Diagnosis not present

## 2022-11-03 DIAGNOSIS — Z992 Dependence on renal dialysis: Secondary | ICD-10-CM | POA: Diagnosis not present

## 2022-11-03 DIAGNOSIS — N186 End stage renal disease: Secondary | ICD-10-CM | POA: Diagnosis not present

## 2022-11-03 DIAGNOSIS — K769 Liver disease, unspecified: Secondary | ICD-10-CM | POA: Diagnosis not present

## 2022-11-04 DIAGNOSIS — K769 Liver disease, unspecified: Secondary | ICD-10-CM | POA: Diagnosis not present

## 2022-11-04 DIAGNOSIS — D631 Anemia in chronic kidney disease: Secondary | ICD-10-CM | POA: Diagnosis not present

## 2022-11-04 DIAGNOSIS — Z4932 Encounter for adequacy testing for peritoneal dialysis: Secondary | ICD-10-CM | POA: Diagnosis not present

## 2022-11-04 DIAGNOSIS — N186 End stage renal disease: Secondary | ICD-10-CM | POA: Diagnosis not present

## 2022-11-04 DIAGNOSIS — N2581 Secondary hyperparathyroidism of renal origin: Secondary | ICD-10-CM | POA: Diagnosis not present

## 2022-11-04 DIAGNOSIS — E119 Type 2 diabetes mellitus without complications: Secondary | ICD-10-CM | POA: Diagnosis not present

## 2022-11-04 DIAGNOSIS — Z992 Dependence on renal dialysis: Secondary | ICD-10-CM | POA: Diagnosis not present

## 2022-11-05 DIAGNOSIS — K769 Liver disease, unspecified: Secondary | ICD-10-CM | POA: Diagnosis not present

## 2022-11-05 DIAGNOSIS — E119 Type 2 diabetes mellitus without complications: Secondary | ICD-10-CM | POA: Diagnosis not present

## 2022-11-05 DIAGNOSIS — N2581 Secondary hyperparathyroidism of renal origin: Secondary | ICD-10-CM | POA: Diagnosis not present

## 2022-11-05 DIAGNOSIS — N186 End stage renal disease: Secondary | ICD-10-CM | POA: Diagnosis not present

## 2022-11-05 DIAGNOSIS — D631 Anemia in chronic kidney disease: Secondary | ICD-10-CM | POA: Diagnosis not present

## 2022-11-05 DIAGNOSIS — Z992 Dependence on renal dialysis: Secondary | ICD-10-CM | POA: Diagnosis not present

## 2022-11-05 DIAGNOSIS — Z4932 Encounter for adequacy testing for peritoneal dialysis: Secondary | ICD-10-CM | POA: Diagnosis not present

## 2022-11-06 DIAGNOSIS — D631 Anemia in chronic kidney disease: Secondary | ICD-10-CM | POA: Diagnosis not present

## 2022-11-06 DIAGNOSIS — Z4932 Encounter for adequacy testing for peritoneal dialysis: Secondary | ICD-10-CM | POA: Diagnosis not present

## 2022-11-06 DIAGNOSIS — E119 Type 2 diabetes mellitus without complications: Secondary | ICD-10-CM | POA: Diagnosis not present

## 2022-11-06 DIAGNOSIS — N186 End stage renal disease: Secondary | ICD-10-CM | POA: Diagnosis not present

## 2022-11-06 DIAGNOSIS — K769 Liver disease, unspecified: Secondary | ICD-10-CM | POA: Diagnosis not present

## 2022-11-06 DIAGNOSIS — Z992 Dependence on renal dialysis: Secondary | ICD-10-CM | POA: Diagnosis not present

## 2022-11-06 DIAGNOSIS — N2581 Secondary hyperparathyroidism of renal origin: Secondary | ICD-10-CM | POA: Diagnosis not present

## 2022-11-07 DIAGNOSIS — Z4932 Encounter for adequacy testing for peritoneal dialysis: Secondary | ICD-10-CM | POA: Diagnosis not present

## 2022-11-07 DIAGNOSIS — N186 End stage renal disease: Secondary | ICD-10-CM | POA: Diagnosis not present

## 2022-11-07 DIAGNOSIS — K769 Liver disease, unspecified: Secondary | ICD-10-CM | POA: Diagnosis not present

## 2022-11-07 DIAGNOSIS — N2581 Secondary hyperparathyroidism of renal origin: Secondary | ICD-10-CM | POA: Diagnosis not present

## 2022-11-07 DIAGNOSIS — Z992 Dependence on renal dialysis: Secondary | ICD-10-CM | POA: Diagnosis not present

## 2022-11-07 DIAGNOSIS — D631 Anemia in chronic kidney disease: Secondary | ICD-10-CM | POA: Diagnosis not present

## 2022-11-07 DIAGNOSIS — E119 Type 2 diabetes mellitus without complications: Secondary | ICD-10-CM | POA: Diagnosis not present

## 2022-11-08 DIAGNOSIS — E119 Type 2 diabetes mellitus without complications: Secondary | ICD-10-CM | POA: Diagnosis not present

## 2022-11-08 DIAGNOSIS — N186 End stage renal disease: Secondary | ICD-10-CM | POA: Diagnosis not present

## 2022-11-08 DIAGNOSIS — D631 Anemia in chronic kidney disease: Secondary | ICD-10-CM | POA: Diagnosis not present

## 2022-11-08 DIAGNOSIS — Z992 Dependence on renal dialysis: Secondary | ICD-10-CM | POA: Diagnosis not present

## 2022-11-08 DIAGNOSIS — K769 Liver disease, unspecified: Secondary | ICD-10-CM | POA: Diagnosis not present

## 2022-11-08 DIAGNOSIS — N2581 Secondary hyperparathyroidism of renal origin: Secondary | ICD-10-CM | POA: Diagnosis not present

## 2022-11-08 DIAGNOSIS — Z4932 Encounter for adequacy testing for peritoneal dialysis: Secondary | ICD-10-CM | POA: Diagnosis not present

## 2022-11-09 DIAGNOSIS — Z4932 Encounter for adequacy testing for peritoneal dialysis: Secondary | ICD-10-CM | POA: Diagnosis not present

## 2022-11-09 DIAGNOSIS — Z992 Dependence on renal dialysis: Secondary | ICD-10-CM | POA: Diagnosis not present

## 2022-11-09 DIAGNOSIS — E119 Type 2 diabetes mellitus without complications: Secondary | ICD-10-CM | POA: Diagnosis not present

## 2022-11-09 DIAGNOSIS — N186 End stage renal disease: Secondary | ICD-10-CM | POA: Diagnosis not present

## 2022-11-09 DIAGNOSIS — N2581 Secondary hyperparathyroidism of renal origin: Secondary | ICD-10-CM | POA: Diagnosis not present

## 2022-11-09 DIAGNOSIS — K769 Liver disease, unspecified: Secondary | ICD-10-CM | POA: Diagnosis not present

## 2022-11-09 DIAGNOSIS — D631 Anemia in chronic kidney disease: Secondary | ICD-10-CM | POA: Diagnosis not present

## 2022-11-10 DIAGNOSIS — K769 Liver disease, unspecified: Secondary | ICD-10-CM | POA: Diagnosis not present

## 2022-11-10 DIAGNOSIS — E119 Type 2 diabetes mellitus without complications: Secondary | ICD-10-CM | POA: Diagnosis not present

## 2022-11-10 DIAGNOSIS — D631 Anemia in chronic kidney disease: Secondary | ICD-10-CM | POA: Diagnosis not present

## 2022-11-10 DIAGNOSIS — N2581 Secondary hyperparathyroidism of renal origin: Secondary | ICD-10-CM | POA: Diagnosis not present

## 2022-11-10 DIAGNOSIS — N186 End stage renal disease: Secondary | ICD-10-CM | POA: Diagnosis not present

## 2022-11-10 DIAGNOSIS — Z992 Dependence on renal dialysis: Secondary | ICD-10-CM | POA: Diagnosis not present

## 2022-11-10 DIAGNOSIS — Z4932 Encounter for adequacy testing for peritoneal dialysis: Secondary | ICD-10-CM | POA: Diagnosis not present

## 2022-11-11 DIAGNOSIS — D631 Anemia in chronic kidney disease: Secondary | ICD-10-CM | POA: Diagnosis not present

## 2022-11-11 DIAGNOSIS — K769 Liver disease, unspecified: Secondary | ICD-10-CM | POA: Diagnosis not present

## 2022-11-11 DIAGNOSIS — Z992 Dependence on renal dialysis: Secondary | ICD-10-CM | POA: Diagnosis not present

## 2022-11-11 DIAGNOSIS — E119 Type 2 diabetes mellitus without complications: Secondary | ICD-10-CM | POA: Diagnosis not present

## 2022-11-11 DIAGNOSIS — N2581 Secondary hyperparathyroidism of renal origin: Secondary | ICD-10-CM | POA: Diagnosis not present

## 2022-11-11 DIAGNOSIS — N186 End stage renal disease: Secondary | ICD-10-CM | POA: Diagnosis not present

## 2022-11-11 DIAGNOSIS — Z4932 Encounter for adequacy testing for peritoneal dialysis: Secondary | ICD-10-CM | POA: Diagnosis not present

## 2022-11-12 ENCOUNTER — Emergency Department (HOSPITAL_COMMUNITY)
Admission: EM | Admit: 2022-11-12 | Discharge: 2022-11-12 | Disposition: A | Discharge status: Home / Self Care | Payer: Medicare HMO | Attending: Student | Admitting: Student

## 2022-11-12 ENCOUNTER — Other Ambulatory Visit: Payer: Self-pay

## 2022-11-12 DIAGNOSIS — I509 Heart failure, unspecified: Secondary | ICD-10-CM | POA: Diagnosis not present

## 2022-11-12 DIAGNOSIS — N2581 Secondary hyperparathyroidism of renal origin: Secondary | ICD-10-CM | POA: Diagnosis not present

## 2022-11-12 DIAGNOSIS — D631 Anemia in chronic kidney disease: Secondary | ICD-10-CM | POA: Diagnosis not present

## 2022-11-12 DIAGNOSIS — W19XXXA Unspecified fall, initial encounter: Secondary | ICD-10-CM | POA: Diagnosis not present

## 2022-11-12 DIAGNOSIS — E1122 Type 2 diabetes mellitus with diabetic chronic kidney disease: Secondary | ICD-10-CM | POA: Insufficient documentation

## 2022-11-12 DIAGNOSIS — I959 Hypotension, unspecified: Secondary | ICD-10-CM | POA: Diagnosis not present

## 2022-11-12 DIAGNOSIS — N186 End stage renal disease: Secondary | ICD-10-CM | POA: Diagnosis not present

## 2022-11-12 DIAGNOSIS — E119 Type 2 diabetes mellitus without complications: Secondary | ICD-10-CM | POA: Diagnosis not present

## 2022-11-12 DIAGNOSIS — I132 Hypertensive heart and chronic kidney disease with heart failure and with stage 5 chronic kidney disease, or end stage renal disease: Secondary | ICD-10-CM | POA: Insufficient documentation

## 2022-11-12 DIAGNOSIS — W1830XA Fall on same level, unspecified, initial encounter: Secondary | ICD-10-CM | POA: Diagnosis not present

## 2022-11-12 DIAGNOSIS — R569 Unspecified convulsions: Secondary | ICD-10-CM | POA: Insufficient documentation

## 2022-11-12 DIAGNOSIS — Z992 Dependence on renal dialysis: Secondary | ICD-10-CM | POA: Insufficient documentation

## 2022-11-12 DIAGNOSIS — R55 Syncope and collapse: Secondary | ICD-10-CM | POA: Diagnosis not present

## 2022-11-12 DIAGNOSIS — K769 Liver disease, unspecified: Secondary | ICD-10-CM | POA: Diagnosis not present

## 2022-11-12 DIAGNOSIS — R42 Dizziness and giddiness: Secondary | ICD-10-CM | POA: Diagnosis not present

## 2022-11-12 DIAGNOSIS — Z4932 Encounter for adequacy testing for peritoneal dialysis: Secondary | ICD-10-CM | POA: Diagnosis not present

## 2022-11-12 LAB — COMPREHENSIVE METABOLIC PANEL
ALT: 18 U/L (ref 0–44)
AST: 21 U/L (ref 15–41)
Albumin: 3.3 g/dL — ABNORMAL LOW (ref 3.5–5.0)
Alkaline Phosphatase: 52 U/L (ref 38–126)
Anion gap: 15 (ref 5–15)
BUN: 51 mg/dL — ABNORMAL HIGH (ref 8–23)
CO2: 25 mmol/L (ref 22–32)
Calcium: 8.8 mg/dL — ABNORMAL LOW (ref 8.9–10.3)
Chloride: 95 mmol/L — ABNORMAL LOW (ref 98–111)
Creatinine, Ser: 7.82 mg/dL — ABNORMAL HIGH (ref 0.61–1.24)
GFR, Estimated: 7 mL/min — ABNORMAL LOW (ref >60)
Glucose, Bld: 145 mg/dL — ABNORMAL HIGH (ref 70–99)
Potassium: 3.2 mmol/L — ABNORMAL LOW (ref 3.5–5.1)
Sodium: 135 mmol/L (ref 135–145)
Total Bilirubin: 0.6 mg/dL (ref 0.3–1.2)
Total Protein: 6 g/dL — ABNORMAL LOW (ref 6.5–8.1)

## 2022-11-12 LAB — CBC WITH DIFFERENTIAL/PLATELET
Abs Immature Granulocytes: 0.03 10*3/uL (ref 0.00–0.07)
Basophils Absolute: 0.1 10*3/uL (ref 0.0–0.1)
Basophils Relative: 1 %
Eosinophils Absolute: 0.2 10*3/uL (ref 0.0–0.5)
Eosinophils Relative: 2 %
HCT: 37.3 % — ABNORMAL LOW (ref 39.0–52.0)
Hemoglobin: 11.8 g/dL — ABNORMAL LOW (ref 13.0–17.0)
Immature Granulocytes: 1 %
Lymphocytes Relative: 31 %
Lymphs Abs: 1.9 10*3/uL (ref 0.7–4.0)
MCH: 23.1 pg — ABNORMAL LOW (ref 26.0–34.0)
MCHC: 31.6 g/dL (ref 30.0–36.0)
MCV: 73.1 fL — ABNORMAL LOW (ref 80.0–100.0)
Monocytes Absolute: 0.4 10*3/uL (ref 0.1–1.0)
Monocytes Relative: 6 %
Neutro Abs: 3.6 10*3/uL (ref 1.7–7.7)
Neutrophils Relative %: 59 %
Platelets: 172 10*3/uL (ref 150–400)
RBC: 5.1 MIL/uL (ref 4.22–5.81)
RDW: 14.3 % (ref 11.5–15.5)
WBC: 6.2 10*3/uL (ref 4.0–10.5)
nRBC: 0 % (ref 0.0–0.2)

## 2022-11-12 LAB — TSH: TSH: 1.354 u[IU]/mL (ref 0.350–4.500)

## 2022-11-12 NOTE — ED Triage Notes (Signed)
Pt walking in [employer] and felt like he was going to pass out. Pt did fall and hit head at that time and [employer] stated they noticed two separate seizure activities. Pt not post-ictal and had no tongue trauma on EMS arrival but did complain of dizziness and weakness which has somewhat improved on arrival to ED. PD patient and states has not missed any treatments.

## 2022-11-12 NOTE — ED Provider Notes (Signed)
[facility] EMERGENCY DEPARTMENT Provider Note  CSN: [id number] Arrival date & time: 11/12/22 1211  Chief Complaint(s) Loss of Consciousness  HPI Ruben Camacho. is a 67 y.o. male with PMH ESRD on daily home peritoneal dialysis, T2DM, gout, HTN, HLD, migraine, CVA who presents emergency department for evaluation of a syncopal episode.  Patient was checking out at [employer] when he felt a sensation of lightheadedness and a feeling like he was going to pass out.  Patient fell to the ground and bystanders reportedly witnessed seizure activity however the patient had no postictal period, no tongue trauma or involuntary loss of bowel or bladder.  Patient states that he has passed out like this before when they took off too much fluid after dialysis.  Here in the emergency department, patient is completely asymptomatic with no complaints of chest pain, shortness of breath, abdominal pain, nausea, vomiting, lightheadedness or any other systemic symptoms.  He denies numbness, tingling, weakness, headache or any other neurologic symptoms.   Past Medical History Past Medical History:  Diagnosis Date   Chronic kidney disease    M-W-F   Diabetes mellitus without complication ([place])    Type II- does not take medicines or check levels at home.   Gout    1-2 exacerbations per year.  Takes Advil PRN for pain.   Hyperlipidemia    Hypertension    Migraine    Stroke ([place]) 07/01/2012   L sided weakness, slurred speech.  Admission [facility].     Vitamin D deficiency 08/09/2019   Patient Active Problem List   Diagnosis Date Noted   ESRD (end stage renal disease) ([place]) 09/29/2022   Protein-calorie malnutrition, severe 12/15/2020   CHF (congestive heart failure) ([place]) 12/14/2020   Hypertensive emergency    Stage 3b chronic kidney disease ([place]) 10/14/2019   Vitamin D deficiency 08/09/2019   Type 2 diabetes mellitus without complication, without long-term current use of insulin  ([place]) 07/25/2019   AKI (acute kidney injury) ([place]) 06/11/2019   Hypertension associated with chronic kidney disease due to type 2 diabetes mellitus ([place]) 11/30/2011   Hyperlipemia 11/30/2011   Gout 11/30/2011   Beta thalassemia ([place]) 11/30/2011   Home Medication(s) Prior to Admission medications   Medication Sig Start Date End Date Taking? Authorizing Provider  ferric citrate (AURYXIA) 1 GM 210 MG(Fe) tablet Take 420 mg by mouth 3 (three) times daily with meals. 09/08/21   [provider]  hydrALAZINE (APRESOLINE) 50 MG tablet Take 50 mg by mouth every 8 (eight) hours. 08/18/22   [provider]  losartan (COZAAR) 25 MG tablet Take 25 mg by mouth daily. 03/17/22   [provider]  oxyCODONE (ROXICODONE) 5 MG immediate release tablet Take 1 tablet (5 mg total) by mouth every 4 (four) hours as needed for severe pain. 09/29/22   Serafina Mitchell, MD  colchicine 0.6 MG tablet Take one pill an hour after your first dose in the ED Patient not taking: Reported on 08/25/2020 08/10/20 08/25/20  Deno Etienne, DO  Past Surgical History Past Surgical History:  Procedure Laterality Date   Admission  07/01/2012   CVA (L sided weakness, slurred speech).  [facility].   AV FISTULA PLACEMENT Left 12/21/2020   Procedure: INSERTION OF ARTERIOVENOUS (AV) GORE-TEX GRAFT ARM;  Surgeon: Angelia Mould, MD;  Location: [place];  Service: Vascular;  Laterality: Left;   CAPD INSERTION N/A 09/29/2022   Procedure: LAPAROSCOPIC INSERTION CONTINUOUS AMBULATORY PERITONEAL DIALYSIS  (CAPD) CATHETER;  Surgeon: Serafina Mitchell, MD;  Location: [place];  Service: Vascular;  Laterality: N/A;   COLONOSCOPY  10/31/2008   no polyps.  Repeat in 10 years.  [place].   HERNIA REPAIR     INSERTION OF DIALYSIS CATHETER Right 12/21/2020   Procedure: INSERTION OF TUNNELED  DIALYSIS CATHETER  RIGHT INTERNAL JUGULAR;  Surgeon: Angelia Mould, MD;  Location: [facility] OR;  Service: Vascular;  Laterality: Right;   Family History Family History  Problem Relation Age of Onset   Stroke Mother    Hypertension Mother    Hypertension Daughter    Cancer Maternal Grandmother        lung   Healthy Father        Died in motorcycle accident    Social History Social History   Tobacco Use   Smoking status: Never   Smokeless tobacco: Never  Vaping Use   Vaping Use: Never used  Substance Use Topics   Alcohol use: No    Alcohol/week: 0.0 standard drinks of alcohol    Comment: former drinker   Drug use: No   Allergies Patient has no known allergies.  Review of Systems Review of Systems  Neurological:  Positive for syncope.    Physical Exam Vital Signs  I have reviewed the triage vital signs BP 123/61   Pulse (!) 58   Temp (!) 97.4 F (36.3 C) (Oral)   Resp 14   Ht 5\' 9"  (1.753 m)   Wt 74.8 kg   SpO2 97%   BMI 24.37 kg/m  \ Physical Exam Vitals and nursing note reviewed.  Constitutional:      General: He is not in acute distress.    Appearance: Normal appearance. He is well-developed.  HENT:     Head: Normocephalic and atraumatic.     Nose: No congestion or rhinorrhea.  Eyes:     General:        Right eye: No discharge.        Left eye: No discharge.     Extraocular Movements: Extraocular movements intact.     Conjunctiva/sclera: Conjunctivae normal.     Pupils: Pupils are equal, round, and reactive to light.  Cardiovascular:     Rate and Rhythm: Normal rate and regular rhythm.     Heart sounds: No murmur heard. Pulmonary:     Effort: Pulmonary effort is normal. No respiratory distress.     Breath sounds: No wheezing or rales.  Abdominal:     General: There is no distension.     Tenderness: There is no abdominal tenderness.  Musculoskeletal:        General: No swelling. Normal range of motion.     Cervical back: Normal range of motion and neck  supple.  Skin:    General: Skin is warm and dry.  Neurological:     General: No focal deficit present.     Mental Status: He is alert.     Cranial Nerves: No cranial nerve deficit.     Sensory: No sensory deficit.  Motor: No weakness.  Psychiatric:        Mood and Affect: Mood normal.     ED Results and Treatments Labs (all labs ordered are listed, but only abnormal results are displayed) Labs Reviewed  COMPREHENSIVE METABOLIC PANEL - Abnormal; Notable for the following components:      Result Value   Potassium 3.2 (*)    Chloride 95 (*)    Glucose, Bld 145 (*)    BUN 51 (*)    Creatinine, Ser 7.82 (*)    Calcium 8.8 (*)    Total Protein 6.0 (*)    Albumin 3.3 (*)    GFR, Estimated 7 (*)    All other components within normal limits  CBC WITH DIFFERENTIAL/PLATELET - Abnormal; Notable for the following components:   Hemoglobin 11.8 (*)    HCT 37.3 (*)    MCV 73.1 (*)    MCH 23.1 (*)    All other components within normal limits  TSH  URINALYSIS, ROUTINE W REFLEX MICROSCOPIC                                                                                                                          Radiology No results found.  Pertinent labs & imaging results that were available during my care of the patient were reviewed by me and considered in my medical decision making (see MDM for details).  Medications Ordered in ED Medications - No data to display                                                                                                                                   Procedures Procedures  (including critical care time)  Medical Decision Making / ED Course   This patient presents to the ED for concern of syncope, this involves an extensive number of treatment options, and is a complaint that carries with it a high risk of complications and morbidity.  The differential diagnosis includes orthostatic syncope, cardiogenic syncope, vasovagal syncope,  seizure, electrolyte abnormality  MDM: Patient seen emerged department for evaluation of a suspected syncopal episode.  Physical exam is unremarkable with no external evidence of head trauma, normal neurologic exam with no focal motor or sensory deficits, no cranial nerve deficits.  Laboratory evaluation with a hemoglobin of 11.8, potassium 3.2, BUN 51, creatinine 7.82 consistent with his history of ESRD on hemodialysis.  ECG unchanged from previous  with no evidence of WPW or Brugada, no current dysrhythmia.  Patient has no evidence of head trauma, no focal sensory deficits and I have very low suspicion for new onset seizure at this time given no postictal period and thus CT imaging was deferred to the head.  I had a long discussion with the patient and he only recently started using hemodialysis and I suspect he may be pulling just a little too much fluid off when running himself at home.  I encouraged him to reach out to his nephrologist to discuss adjustments.  At this time, patient has no symptoms and is hemodynamically stable.  A referral was placed to cardiology for recurrent syncope and patient discharged with outpatient follow-up.   Additional history obtained:  -External records from outside source obtained and reviewed including: Chart review including previous notes, labs, imaging, consultation notes   Lab Tests: -I ordered, reviewed, and interpreted labs.   The pertinent results include:   Labs Reviewed  COMPREHENSIVE METABOLIC PANEL - Abnormal; Notable for the following components:      Result Value   Potassium 3.2 (*)    Chloride 95 (*)    Glucose, Bld 145 (*)    BUN 51 (*)    Creatinine, Ser 7.82 (*)    Calcium 8.8 (*)    Total Protein 6.0 (*)    Albumin 3.3 (*)    GFR, Estimated 7 (*)    All other components within normal limits  CBC WITH DIFFERENTIAL/PLATELET - Abnormal; Notable for the following components:   Hemoglobin 11.8 (*)    HCT 37.3 (*)    MCV 73.1 (*)    MCH  23.1 (*)    All other components within normal limits  TSH  URINALYSIS, ROUTINE W REFLEX MICROSCOPIC      EKG   EKG Interpretation  Date/Time:  Saturday November 12 2022 14:08:00 EST Ventricular Rate:  63 PR Interval:  200 QRS Duration: 84 QT Interval:  436 QTC Calculation: 447 R Axis:   5 Text Interpretation: Sinus rhythm Borderline T abnormalities, inferior leads Confirmed by [place] ([id number]) on 11/12/2022 2:24:23 PM          Medicines ordered and prescription drug management: No orders of the defined types were placed in this encounter.   -I have reviewed the patients home medicines and have made adjustments as needed  Critical interventions none    Cardiac Monitoring: The patient was maintained on a cardiac monitor.  I personally viewed and interpreted the cardiac monitored which showed an underlying rhythm of: NSR  Social Determinants of Health:  Factors impacting patients care include: none   Reevaluation: After the interventions noted above, I reevaluated the patient and found that they have :improved  Co morbidities that complicate the patient evaluation  Past Medical History:  Diagnosis Date   Chronic kidney disease    M-W-F   Diabetes mellitus without complication (HCC)    Type II- does not take medicines or check levels at home.   Gout    1-2 exacerbations per year.  Takes Advil PRN for pain.   Hyperlipidemia    Hypertension    Migraine    Stroke ([place]) 07/01/2012   L sided weakness, slurred speech.  Admission [facility].     Vitamin D deficiency 08/09/2019      Dispostion: I considered admission for this patient, but at this time he does not meet inpatient criteria for admission he is safe for discharge with outpatient follow-up  Final Clinical Impression(s) / ED Diagnoses Final diagnoses:  None     @PCDICTATION @    Teressa Lower, MD 11/12/22 [phone]

## 2022-11-13 DIAGNOSIS — Z4932 Encounter for adequacy testing for peritoneal dialysis: Secondary | ICD-10-CM | POA: Diagnosis not present

## 2022-11-13 DIAGNOSIS — D631 Anemia in chronic kidney disease: Secondary | ICD-10-CM | POA: Diagnosis not present

## 2022-11-13 DIAGNOSIS — N186 End stage renal disease: Secondary | ICD-10-CM | POA: Diagnosis not present

## 2022-11-13 DIAGNOSIS — E119 Type 2 diabetes mellitus without complications: Secondary | ICD-10-CM | POA: Diagnosis not present

## 2022-11-13 DIAGNOSIS — K769 Liver disease, unspecified: Secondary | ICD-10-CM | POA: Diagnosis not present

## 2022-11-13 DIAGNOSIS — N2581 Secondary hyperparathyroidism of renal origin: Secondary | ICD-10-CM | POA: Diagnosis not present

## 2022-11-13 DIAGNOSIS — Z992 Dependence on renal dialysis: Secondary | ICD-10-CM | POA: Diagnosis not present

## 2022-11-14 DIAGNOSIS — N186 End stage renal disease: Secondary | ICD-10-CM | POA: Diagnosis not present

## 2022-11-14 DIAGNOSIS — N2581 Secondary hyperparathyroidism of renal origin: Secondary | ICD-10-CM | POA: Diagnosis not present

## 2022-11-14 DIAGNOSIS — Z992 Dependence on renal dialysis: Secondary | ICD-10-CM | POA: Diagnosis not present

## 2022-11-14 DIAGNOSIS — K769 Liver disease, unspecified: Secondary | ICD-10-CM | POA: Diagnosis not present

## 2022-11-14 DIAGNOSIS — E119 Type 2 diabetes mellitus without complications: Secondary | ICD-10-CM | POA: Diagnosis not present

## 2022-11-14 DIAGNOSIS — Z4932 Encounter for adequacy testing for peritoneal dialysis: Secondary | ICD-10-CM | POA: Diagnosis not present

## 2022-11-14 DIAGNOSIS — D631 Anemia in chronic kidney disease: Secondary | ICD-10-CM | POA: Diagnosis not present

## 2022-11-15 DIAGNOSIS — N2581 Secondary hyperparathyroidism of renal origin: Secondary | ICD-10-CM | POA: Diagnosis not present

## 2022-11-15 DIAGNOSIS — Z4932 Encounter for adequacy testing for peritoneal dialysis: Secondary | ICD-10-CM | POA: Diagnosis not present

## 2022-11-15 DIAGNOSIS — K769 Liver disease, unspecified: Secondary | ICD-10-CM | POA: Diagnosis not present

## 2022-11-15 DIAGNOSIS — D631 Anemia in chronic kidney disease: Secondary | ICD-10-CM | POA: Diagnosis not present

## 2022-11-15 DIAGNOSIS — Z992 Dependence on renal dialysis: Secondary | ICD-10-CM | POA: Diagnosis not present

## 2022-11-15 DIAGNOSIS — N186 End stage renal disease: Secondary | ICD-10-CM | POA: Diagnosis not present

## 2022-11-15 DIAGNOSIS — E119 Type 2 diabetes mellitus without complications: Secondary | ICD-10-CM | POA: Diagnosis not present

## 2022-11-16 DIAGNOSIS — K769 Liver disease, unspecified: Secondary | ICD-10-CM | POA: Diagnosis not present

## 2022-11-16 DIAGNOSIS — N186 End stage renal disease: Secondary | ICD-10-CM | POA: Diagnosis not present

## 2022-11-16 DIAGNOSIS — N2581 Secondary hyperparathyroidism of renal origin: Secondary | ICD-10-CM | POA: Diagnosis not present

## 2022-11-16 DIAGNOSIS — E119 Type 2 diabetes mellitus without complications: Secondary | ICD-10-CM | POA: Diagnosis not present

## 2022-11-16 DIAGNOSIS — Z4932 Encounter for adequacy testing for peritoneal dialysis: Secondary | ICD-10-CM | POA: Diagnosis not present

## 2022-11-16 DIAGNOSIS — D631 Anemia in chronic kidney disease: Secondary | ICD-10-CM | POA: Diagnosis not present

## 2022-11-16 DIAGNOSIS — Z992 Dependence on renal dialysis: Secondary | ICD-10-CM | POA: Diagnosis not present

## 2022-11-17 DIAGNOSIS — N2581 Secondary hyperparathyroidism of renal origin: Secondary | ICD-10-CM | POA: Diagnosis not present

## 2022-11-17 DIAGNOSIS — N186 End stage renal disease: Secondary | ICD-10-CM | POA: Diagnosis not present

## 2022-11-17 DIAGNOSIS — Z992 Dependence on renal dialysis: Secondary | ICD-10-CM | POA: Diagnosis not present

## 2022-11-17 DIAGNOSIS — E119 Type 2 diabetes mellitus without complications: Secondary | ICD-10-CM | POA: Diagnosis not present

## 2022-11-17 DIAGNOSIS — D631 Anemia in chronic kidney disease: Secondary | ICD-10-CM | POA: Diagnosis not present

## 2022-11-17 DIAGNOSIS — Z4932 Encounter for adequacy testing for peritoneal dialysis: Secondary | ICD-10-CM | POA: Diagnosis not present

## 2022-11-17 DIAGNOSIS — K769 Liver disease, unspecified: Secondary | ICD-10-CM | POA: Diagnosis not present

## 2022-11-18 DIAGNOSIS — N186 End stage renal disease: Secondary | ICD-10-CM | POA: Diagnosis not present

## 2022-11-18 DIAGNOSIS — N2581 Secondary hyperparathyroidism of renal origin: Secondary | ICD-10-CM | POA: Diagnosis not present

## 2022-11-18 DIAGNOSIS — D631 Anemia in chronic kidney disease: Secondary | ICD-10-CM | POA: Diagnosis not present

## 2022-11-18 DIAGNOSIS — K769 Liver disease, unspecified: Secondary | ICD-10-CM | POA: Diagnosis not present

## 2022-11-18 DIAGNOSIS — Z4932 Encounter for adequacy testing for peritoneal dialysis: Secondary | ICD-10-CM | POA: Diagnosis not present

## 2022-11-18 DIAGNOSIS — E119 Type 2 diabetes mellitus without complications: Secondary | ICD-10-CM | POA: Diagnosis not present

## 2022-11-18 DIAGNOSIS — Z992 Dependence on renal dialysis: Secondary | ICD-10-CM | POA: Diagnosis not present

## 2022-11-19 DIAGNOSIS — N186 End stage renal disease: Secondary | ICD-10-CM | POA: Diagnosis not present

## 2022-11-19 DIAGNOSIS — Z992 Dependence on renal dialysis: Secondary | ICD-10-CM | POA: Diagnosis not present

## 2022-11-19 DIAGNOSIS — K769 Liver disease, unspecified: Secondary | ICD-10-CM | POA: Diagnosis not present

## 2022-11-19 DIAGNOSIS — N2581 Secondary hyperparathyroidism of renal origin: Secondary | ICD-10-CM | POA: Diagnosis not present

## 2022-11-19 DIAGNOSIS — Z4932 Encounter for adequacy testing for peritoneal dialysis: Secondary | ICD-10-CM | POA: Diagnosis not present

## 2022-11-19 DIAGNOSIS — E119 Type 2 diabetes mellitus without complications: Secondary | ICD-10-CM | POA: Diagnosis not present

## 2022-11-19 DIAGNOSIS — D631 Anemia in chronic kidney disease: Secondary | ICD-10-CM | POA: Diagnosis not present

## 2022-11-20 DIAGNOSIS — K769 Liver disease, unspecified: Secondary | ICD-10-CM | POA: Diagnosis not present

## 2022-11-20 DIAGNOSIS — N2581 Secondary hyperparathyroidism of renal origin: Secondary | ICD-10-CM | POA: Diagnosis not present

## 2022-11-20 DIAGNOSIS — E119 Type 2 diabetes mellitus without complications: Secondary | ICD-10-CM | POA: Diagnosis not present

## 2022-11-20 DIAGNOSIS — D631 Anemia in chronic kidney disease: Secondary | ICD-10-CM | POA: Diagnosis not present

## 2022-11-20 DIAGNOSIS — N186 End stage renal disease: Secondary | ICD-10-CM | POA: Diagnosis not present

## 2022-11-20 DIAGNOSIS — Z992 Dependence on renal dialysis: Secondary | ICD-10-CM | POA: Diagnosis not present

## 2022-11-20 DIAGNOSIS — Z4932 Encounter for adequacy testing for peritoneal dialysis: Secondary | ICD-10-CM | POA: Diagnosis not present

## 2022-11-21 DIAGNOSIS — D631 Anemia in chronic kidney disease: Secondary | ICD-10-CM | POA: Diagnosis not present

## 2022-11-21 DIAGNOSIS — N2581 Secondary hyperparathyroidism of renal origin: Secondary | ICD-10-CM | POA: Diagnosis not present

## 2022-11-21 DIAGNOSIS — Z992 Dependence on renal dialysis: Secondary | ICD-10-CM | POA: Diagnosis not present

## 2022-11-21 DIAGNOSIS — K769 Liver disease, unspecified: Secondary | ICD-10-CM | POA: Diagnosis not present

## 2022-11-21 DIAGNOSIS — N186 End stage renal disease: Secondary | ICD-10-CM | POA: Diagnosis not present

## 2022-11-21 DIAGNOSIS — Z4932 Encounter for adequacy testing for peritoneal dialysis: Secondary | ICD-10-CM | POA: Diagnosis not present

## 2022-11-21 DIAGNOSIS — E119 Type 2 diabetes mellitus without complications: Secondary | ICD-10-CM | POA: Diagnosis not present

## 2022-11-22 DIAGNOSIS — Z4932 Encounter for adequacy testing for peritoneal dialysis: Secondary | ICD-10-CM | POA: Diagnosis not present

## 2022-11-22 DIAGNOSIS — D631 Anemia in chronic kidney disease: Secondary | ICD-10-CM | POA: Diagnosis not present

## 2022-11-22 DIAGNOSIS — Z992 Dependence on renal dialysis: Secondary | ICD-10-CM | POA: Diagnosis not present

## 2022-11-22 DIAGNOSIS — N2581 Secondary hyperparathyroidism of renal origin: Secondary | ICD-10-CM | POA: Diagnosis not present

## 2022-11-22 DIAGNOSIS — K769 Liver disease, unspecified: Secondary | ICD-10-CM | POA: Diagnosis not present

## 2022-11-22 DIAGNOSIS — N186 End stage renal disease: Secondary | ICD-10-CM | POA: Diagnosis not present

## 2022-11-22 DIAGNOSIS — E119 Type 2 diabetes mellitus without complications: Secondary | ICD-10-CM | POA: Diagnosis not present

## 2022-11-23 DIAGNOSIS — Z992 Dependence on renal dialysis: Secondary | ICD-10-CM | POA: Diagnosis not present

## 2022-11-23 DIAGNOSIS — N186 End stage renal disease: Secondary | ICD-10-CM | POA: Diagnosis not present

## 2022-11-23 DIAGNOSIS — D631 Anemia in chronic kidney disease: Secondary | ICD-10-CM | POA: Diagnosis not present

## 2022-11-23 DIAGNOSIS — E119 Type 2 diabetes mellitus without complications: Secondary | ICD-10-CM | POA: Diagnosis not present

## 2022-11-23 DIAGNOSIS — Z4932 Encounter for adequacy testing for peritoneal dialysis: Secondary | ICD-10-CM | POA: Diagnosis not present

## 2022-11-23 DIAGNOSIS — K769 Liver disease, unspecified: Secondary | ICD-10-CM | POA: Diagnosis not present

## 2022-11-23 DIAGNOSIS — N2581 Secondary hyperparathyroidism of renal origin: Secondary | ICD-10-CM | POA: Diagnosis not present

## 2022-11-24 DIAGNOSIS — Z4932 Encounter for adequacy testing for peritoneal dialysis: Secondary | ICD-10-CM | POA: Diagnosis not present

## 2022-11-24 DIAGNOSIS — E119 Type 2 diabetes mellitus without complications: Secondary | ICD-10-CM | POA: Diagnosis not present

## 2022-11-24 DIAGNOSIS — K769 Liver disease, unspecified: Secondary | ICD-10-CM | POA: Diagnosis not present

## 2022-11-24 DIAGNOSIS — N186 End stage renal disease: Secondary | ICD-10-CM | POA: Diagnosis not present

## 2022-11-24 DIAGNOSIS — Z992 Dependence on renal dialysis: Secondary | ICD-10-CM | POA: Diagnosis not present

## 2022-11-24 DIAGNOSIS — D631 Anemia in chronic kidney disease: Secondary | ICD-10-CM | POA: Diagnosis not present

## 2022-11-24 DIAGNOSIS — N2581 Secondary hyperparathyroidism of renal origin: Secondary | ICD-10-CM | POA: Diagnosis not present

## 2022-11-25 DIAGNOSIS — D631 Anemia in chronic kidney disease: Secondary | ICD-10-CM | POA: Diagnosis not present

## 2022-11-25 DIAGNOSIS — E119 Type 2 diabetes mellitus without complications: Secondary | ICD-10-CM | POA: Diagnosis not present

## 2022-11-25 DIAGNOSIS — N186 End stage renal disease: Secondary | ICD-10-CM | POA: Diagnosis not present

## 2022-11-25 DIAGNOSIS — N2581 Secondary hyperparathyroidism of renal origin: Secondary | ICD-10-CM | POA: Diagnosis not present

## 2022-11-25 DIAGNOSIS — K769 Liver disease, unspecified: Secondary | ICD-10-CM | POA: Diagnosis not present

## 2022-11-25 DIAGNOSIS — Z992 Dependence on renal dialysis: Secondary | ICD-10-CM | POA: Diagnosis not present

## 2022-11-25 DIAGNOSIS — Z4932 Encounter for adequacy testing for peritoneal dialysis: Secondary | ICD-10-CM | POA: Diagnosis not present

## 2022-11-26 DIAGNOSIS — D631 Anemia in chronic kidney disease: Secondary | ICD-10-CM | POA: Diagnosis not present

## 2022-11-26 DIAGNOSIS — K769 Liver disease, unspecified: Secondary | ICD-10-CM | POA: Diagnosis not present

## 2022-11-26 DIAGNOSIS — Z992 Dependence on renal dialysis: Secondary | ICD-10-CM | POA: Diagnosis not present

## 2022-11-26 DIAGNOSIS — E119 Type 2 diabetes mellitus without complications: Secondary | ICD-10-CM | POA: Diagnosis not present

## 2022-11-26 DIAGNOSIS — N186 End stage renal disease: Secondary | ICD-10-CM | POA: Diagnosis not present

## 2022-11-26 DIAGNOSIS — Z4932 Encounter for adequacy testing for peritoneal dialysis: Secondary | ICD-10-CM | POA: Diagnosis not present

## 2022-11-26 DIAGNOSIS — N2581 Secondary hyperparathyroidism of renal origin: Secondary | ICD-10-CM | POA: Diagnosis not present

## 2022-11-27 DIAGNOSIS — N186 End stage renal disease: Secondary | ICD-10-CM | POA: Diagnosis not present

## 2022-11-27 DIAGNOSIS — E119 Type 2 diabetes mellitus without complications: Secondary | ICD-10-CM | POA: Diagnosis not present

## 2022-11-27 DIAGNOSIS — Z992 Dependence on renal dialysis: Secondary | ICD-10-CM | POA: Diagnosis not present

## 2022-11-27 DIAGNOSIS — Z4932 Encounter for adequacy testing for peritoneal dialysis: Secondary | ICD-10-CM | POA: Diagnosis not present

## 2022-11-27 DIAGNOSIS — D631 Anemia in chronic kidney disease: Secondary | ICD-10-CM | POA: Diagnosis not present

## 2022-11-27 DIAGNOSIS — K769 Liver disease, unspecified: Secondary | ICD-10-CM | POA: Diagnosis not present

## 2022-11-27 DIAGNOSIS — N2581 Secondary hyperparathyroidism of renal origin: Secondary | ICD-10-CM | POA: Diagnosis not present

## 2022-11-28 DIAGNOSIS — Z4932 Encounter for adequacy testing for peritoneal dialysis: Secondary | ICD-10-CM | POA: Diagnosis not present

## 2022-11-28 DIAGNOSIS — K769 Liver disease, unspecified: Secondary | ICD-10-CM | POA: Diagnosis not present

## 2022-11-28 DIAGNOSIS — E119 Type 2 diabetes mellitus without complications: Secondary | ICD-10-CM | POA: Diagnosis not present

## 2022-11-28 DIAGNOSIS — N2581 Secondary hyperparathyroidism of renal origin: Secondary | ICD-10-CM | POA: Diagnosis not present

## 2022-11-28 DIAGNOSIS — N186 End stage renal disease: Secondary | ICD-10-CM | POA: Diagnosis not present

## 2022-11-28 DIAGNOSIS — D631 Anemia in chronic kidney disease: Secondary | ICD-10-CM | POA: Diagnosis not present

## 2022-11-28 DIAGNOSIS — Z992 Dependence on renal dialysis: Secondary | ICD-10-CM | POA: Diagnosis not present

## 2022-11-29 DIAGNOSIS — N2581 Secondary hyperparathyroidism of renal origin: Secondary | ICD-10-CM | POA: Diagnosis not present

## 2022-11-29 DIAGNOSIS — D631 Anemia in chronic kidney disease: Secondary | ICD-10-CM | POA: Diagnosis not present

## 2022-11-29 DIAGNOSIS — K769 Liver disease, unspecified: Secondary | ICD-10-CM | POA: Diagnosis not present

## 2022-11-29 DIAGNOSIS — Z992 Dependence on renal dialysis: Secondary | ICD-10-CM | POA: Diagnosis not present

## 2022-11-29 DIAGNOSIS — Z4932 Encounter for adequacy testing for peritoneal dialysis: Secondary | ICD-10-CM | POA: Diagnosis not present

## 2022-11-29 DIAGNOSIS — N186 End stage renal disease: Secondary | ICD-10-CM | POA: Diagnosis not present

## 2022-11-29 DIAGNOSIS — E119 Type 2 diabetes mellitus without complications: Secondary | ICD-10-CM | POA: Diagnosis not present

## 2022-11-30 DIAGNOSIS — Z4932 Encounter for adequacy testing for peritoneal dialysis: Secondary | ICD-10-CM | POA: Diagnosis not present

## 2022-11-30 DIAGNOSIS — Z992 Dependence on renal dialysis: Secondary | ICD-10-CM | POA: Diagnosis not present

## 2022-11-30 DIAGNOSIS — D631 Anemia in chronic kidney disease: Secondary | ICD-10-CM | POA: Diagnosis not present

## 2022-11-30 DIAGNOSIS — E1122 Type 2 diabetes mellitus with diabetic chronic kidney disease: Secondary | ICD-10-CM | POA: Diagnosis not present

## 2022-11-30 DIAGNOSIS — E119 Type 2 diabetes mellitus without complications: Secondary | ICD-10-CM | POA: Diagnosis not present

## 2022-11-30 DIAGNOSIS — N186 End stage renal disease: Secondary | ICD-10-CM | POA: Diagnosis not present

## 2022-11-30 DIAGNOSIS — K769 Liver disease, unspecified: Secondary | ICD-10-CM | POA: Diagnosis not present

## 2022-11-30 DIAGNOSIS — N2581 Secondary hyperparathyroidism of renal origin: Secondary | ICD-10-CM | POA: Diagnosis not present

## 2022-12-01 DIAGNOSIS — D631 Anemia in chronic kidney disease: Secondary | ICD-10-CM | POA: Diagnosis not present

## 2022-12-01 DIAGNOSIS — N186 End stage renal disease: Secondary | ICD-10-CM | POA: Diagnosis not present

## 2022-12-01 DIAGNOSIS — N2581 Secondary hyperparathyroidism of renal origin: Secondary | ICD-10-CM | POA: Diagnosis not present

## 2022-12-01 DIAGNOSIS — K769 Liver disease, unspecified: Secondary | ICD-10-CM | POA: Diagnosis not present

## 2022-12-01 DIAGNOSIS — Z992 Dependence on renal dialysis: Secondary | ICD-10-CM | POA: Diagnosis not present

## 2022-12-02 DIAGNOSIS — Z992 Dependence on renal dialysis: Secondary | ICD-10-CM | POA: Diagnosis not present

## 2022-12-02 DIAGNOSIS — N186 End stage renal disease: Secondary | ICD-10-CM | POA: Diagnosis not present

## 2022-12-02 DIAGNOSIS — D631 Anemia in chronic kidney disease: Secondary | ICD-10-CM | POA: Diagnosis not present

## 2022-12-02 DIAGNOSIS — N2581 Secondary hyperparathyroidism of renal origin: Secondary | ICD-10-CM | POA: Diagnosis not present

## 2022-12-02 DIAGNOSIS — K769 Liver disease, unspecified: Secondary | ICD-10-CM | POA: Diagnosis not present

## 2022-12-03 DIAGNOSIS — N186 End stage renal disease: Secondary | ICD-10-CM | POA: Diagnosis not present

## 2022-12-03 DIAGNOSIS — D631 Anemia in chronic kidney disease: Secondary | ICD-10-CM | POA: Diagnosis not present

## 2022-12-03 DIAGNOSIS — K769 Liver disease, unspecified: Secondary | ICD-10-CM | POA: Diagnosis not present

## 2022-12-03 DIAGNOSIS — N2581 Secondary hyperparathyroidism of renal origin: Secondary | ICD-10-CM | POA: Diagnosis not present

## 2022-12-03 DIAGNOSIS — Z992 Dependence on renal dialysis: Secondary | ICD-10-CM | POA: Diagnosis not present

## 2022-12-04 DIAGNOSIS — K769 Liver disease, unspecified: Secondary | ICD-10-CM | POA: Diagnosis not present

## 2022-12-04 DIAGNOSIS — N186 End stage renal disease: Secondary | ICD-10-CM | POA: Diagnosis not present

## 2022-12-04 DIAGNOSIS — Z992 Dependence on renal dialysis: Secondary | ICD-10-CM | POA: Diagnosis not present

## 2022-12-04 DIAGNOSIS — D631 Anemia in chronic kidney disease: Secondary | ICD-10-CM | POA: Diagnosis not present

## 2022-12-04 DIAGNOSIS — N2581 Secondary hyperparathyroidism of renal origin: Secondary | ICD-10-CM | POA: Diagnosis not present

## 2022-12-05 DIAGNOSIS — N2581 Secondary hyperparathyroidism of renal origin: Secondary | ICD-10-CM | POA: Diagnosis not present

## 2022-12-05 DIAGNOSIS — N186 End stage renal disease: Secondary | ICD-10-CM | POA: Diagnosis not present

## 2022-12-05 DIAGNOSIS — D631 Anemia in chronic kidney disease: Secondary | ICD-10-CM | POA: Diagnosis not present

## 2022-12-05 DIAGNOSIS — K769 Liver disease, unspecified: Secondary | ICD-10-CM | POA: Diagnosis not present

## 2022-12-05 DIAGNOSIS — Z992 Dependence on renal dialysis: Secondary | ICD-10-CM | POA: Diagnosis not present

## 2022-12-06 DIAGNOSIS — D631 Anemia in chronic kidney disease: Secondary | ICD-10-CM | POA: Diagnosis not present

## 2022-12-06 DIAGNOSIS — N2581 Secondary hyperparathyroidism of renal origin: Secondary | ICD-10-CM | POA: Diagnosis not present

## 2022-12-06 DIAGNOSIS — N186 End stage renal disease: Secondary | ICD-10-CM | POA: Diagnosis not present

## 2022-12-06 DIAGNOSIS — Z992 Dependence on renal dialysis: Secondary | ICD-10-CM | POA: Diagnosis not present

## 2022-12-06 DIAGNOSIS — K769 Liver disease, unspecified: Secondary | ICD-10-CM | POA: Diagnosis not present

## 2022-12-07 DIAGNOSIS — Z992 Dependence on renal dialysis: Secondary | ICD-10-CM | POA: Diagnosis not present

## 2022-12-07 DIAGNOSIS — N2581 Secondary hyperparathyroidism of renal origin: Secondary | ICD-10-CM | POA: Diagnosis not present

## 2022-12-07 DIAGNOSIS — N186 End stage renal disease: Secondary | ICD-10-CM | POA: Diagnosis not present

## 2022-12-07 DIAGNOSIS — D631 Anemia in chronic kidney disease: Secondary | ICD-10-CM | POA: Diagnosis not present

## 2022-12-07 DIAGNOSIS — K769 Liver disease, unspecified: Secondary | ICD-10-CM | POA: Diagnosis not present

## 2022-12-08 DIAGNOSIS — N2581 Secondary hyperparathyroidism of renal origin: Secondary | ICD-10-CM | POA: Diagnosis not present

## 2022-12-08 DIAGNOSIS — N186 End stage renal disease: Secondary | ICD-10-CM | POA: Diagnosis not present

## 2022-12-08 DIAGNOSIS — D631 Anemia in chronic kidney disease: Secondary | ICD-10-CM | POA: Diagnosis not present

## 2022-12-08 DIAGNOSIS — Z992 Dependence on renal dialysis: Secondary | ICD-10-CM | POA: Diagnosis not present

## 2022-12-08 DIAGNOSIS — K769 Liver disease, unspecified: Secondary | ICD-10-CM | POA: Diagnosis not present

## 2022-12-09 DIAGNOSIS — Z992 Dependence on renal dialysis: Secondary | ICD-10-CM | POA: Diagnosis not present

## 2022-12-09 DIAGNOSIS — D631 Anemia in chronic kidney disease: Secondary | ICD-10-CM | POA: Diagnosis not present

## 2022-12-09 DIAGNOSIS — N2581 Secondary hyperparathyroidism of renal origin: Secondary | ICD-10-CM | POA: Diagnosis not present

## 2022-12-09 DIAGNOSIS — K769 Liver disease, unspecified: Secondary | ICD-10-CM | POA: Diagnosis not present

## 2022-12-09 DIAGNOSIS — N186 End stage renal disease: Secondary | ICD-10-CM | POA: Diagnosis not present

## 2022-12-10 DIAGNOSIS — K769 Liver disease, unspecified: Secondary | ICD-10-CM | POA: Diagnosis not present

## 2022-12-10 DIAGNOSIS — Z992 Dependence on renal dialysis: Secondary | ICD-10-CM | POA: Diagnosis not present

## 2022-12-10 DIAGNOSIS — D631 Anemia in chronic kidney disease: Secondary | ICD-10-CM | POA: Diagnosis not present

## 2022-12-10 DIAGNOSIS — N186 End stage renal disease: Secondary | ICD-10-CM | POA: Diagnosis not present

## 2022-12-10 DIAGNOSIS — N2581 Secondary hyperparathyroidism of renal origin: Secondary | ICD-10-CM | POA: Diagnosis not present

## 2022-12-11 DIAGNOSIS — N2581 Secondary hyperparathyroidism of renal origin: Secondary | ICD-10-CM | POA: Diagnosis not present

## 2022-12-11 DIAGNOSIS — N186 End stage renal disease: Secondary | ICD-10-CM | POA: Diagnosis not present

## 2022-12-11 DIAGNOSIS — Z992 Dependence on renal dialysis: Secondary | ICD-10-CM | POA: Diagnosis not present

## 2022-12-11 DIAGNOSIS — K769 Liver disease, unspecified: Secondary | ICD-10-CM | POA: Diagnosis not present

## 2022-12-11 DIAGNOSIS — D631 Anemia in chronic kidney disease: Secondary | ICD-10-CM | POA: Diagnosis not present

## 2022-12-12 DIAGNOSIS — K769 Liver disease, unspecified: Secondary | ICD-10-CM | POA: Diagnosis not present

## 2022-12-12 DIAGNOSIS — Z992 Dependence on renal dialysis: Secondary | ICD-10-CM | POA: Diagnosis not present

## 2022-12-12 DIAGNOSIS — N2581 Secondary hyperparathyroidism of renal origin: Secondary | ICD-10-CM | POA: Diagnosis not present

## 2022-12-12 DIAGNOSIS — N186 End stage renal disease: Secondary | ICD-10-CM | POA: Diagnosis not present

## 2022-12-12 DIAGNOSIS — D631 Anemia in chronic kidney disease: Secondary | ICD-10-CM | POA: Diagnosis not present

## 2022-12-13 DIAGNOSIS — Z992 Dependence on renal dialysis: Secondary | ICD-10-CM | POA: Diagnosis not present

## 2022-12-13 DIAGNOSIS — N2581 Secondary hyperparathyroidism of renal origin: Secondary | ICD-10-CM | POA: Diagnosis not present

## 2022-12-13 DIAGNOSIS — D631 Anemia in chronic kidney disease: Secondary | ICD-10-CM | POA: Diagnosis not present

## 2022-12-13 DIAGNOSIS — N186 End stage renal disease: Secondary | ICD-10-CM | POA: Diagnosis not present

## 2022-12-13 DIAGNOSIS — K769 Liver disease, unspecified: Secondary | ICD-10-CM | POA: Diagnosis not present

## 2022-12-14 DIAGNOSIS — Z992 Dependence on renal dialysis: Secondary | ICD-10-CM | POA: Diagnosis not present

## 2022-12-14 DIAGNOSIS — N2581 Secondary hyperparathyroidism of renal origin: Secondary | ICD-10-CM | POA: Diagnosis not present

## 2022-12-14 DIAGNOSIS — K769 Liver disease, unspecified: Secondary | ICD-10-CM | POA: Diagnosis not present

## 2022-12-14 DIAGNOSIS — N186 End stage renal disease: Secondary | ICD-10-CM | POA: Diagnosis not present

## 2022-12-14 DIAGNOSIS — D631 Anemia in chronic kidney disease: Secondary | ICD-10-CM | POA: Diagnosis not present

## 2022-12-15 DIAGNOSIS — D631 Anemia in chronic kidney disease: Secondary | ICD-10-CM | POA: Diagnosis not present

## 2022-12-15 DIAGNOSIS — N2581 Secondary hyperparathyroidism of renal origin: Secondary | ICD-10-CM | POA: Diagnosis not present

## 2022-12-15 DIAGNOSIS — D689 Coagulation defect, unspecified: Secondary | ICD-10-CM | POA: Diagnosis not present

## 2022-12-15 DIAGNOSIS — N186 End stage renal disease: Secondary | ICD-10-CM | POA: Diagnosis not present

## 2022-12-15 DIAGNOSIS — E119 Type 2 diabetes mellitus without complications: Secondary | ICD-10-CM | POA: Diagnosis not present

## 2022-12-15 DIAGNOSIS — Z992 Dependence on renal dialysis: Secondary | ICD-10-CM | POA: Diagnosis not present

## 2022-12-16 ENCOUNTER — Telehealth (HOSPITAL_COMMUNITY): Payer: Self-pay | Admitting: *Deleted

## 2022-12-16 NOTE — Telephone Encounter (Signed)
Fax received from Dr Pearson Grippe requesting PD Catheter removal. Will give to [facility].

## 2022-12-17 DIAGNOSIS — D631 Anemia in chronic kidney disease: Secondary | ICD-10-CM | POA: Diagnosis not present

## 2022-12-17 DIAGNOSIS — N2581 Secondary hyperparathyroidism of renal origin: Secondary | ICD-10-CM | POA: Diagnosis not present

## 2022-12-17 DIAGNOSIS — E119 Type 2 diabetes mellitus without complications: Secondary | ICD-10-CM | POA: Diagnosis not present

## 2022-12-17 DIAGNOSIS — D689 Coagulation defect, unspecified: Secondary | ICD-10-CM | POA: Diagnosis not present

## 2022-12-17 DIAGNOSIS — N186 End stage renal disease: Secondary | ICD-10-CM | POA: Diagnosis not present

## 2022-12-17 DIAGNOSIS — Z992 Dependence on renal dialysis: Secondary | ICD-10-CM | POA: Diagnosis not present

## 2022-12-20 ENCOUNTER — Telehealth: Payer: Self-pay

## 2022-12-20 ENCOUNTER — Other Ambulatory Visit: Payer: Self-pay

## 2022-12-20 DIAGNOSIS — D631 Anemia in chronic kidney disease: Secondary | ICD-10-CM | POA: Diagnosis not present

## 2022-12-20 DIAGNOSIS — N186 End stage renal disease: Secondary | ICD-10-CM

## 2022-12-20 DIAGNOSIS — N2581 Secondary hyperparathyroidism of renal origin: Secondary | ICD-10-CM | POA: Diagnosis not present

## 2022-12-20 DIAGNOSIS — D689 Coagulation defect, unspecified: Secondary | ICD-10-CM | POA: Diagnosis not present

## 2022-12-20 DIAGNOSIS — Z992 Dependence on renal dialysis: Secondary | ICD-10-CM | POA: Diagnosis not present

## 2022-12-20 DIAGNOSIS — E119 Type 2 diabetes mellitus without complications: Secondary | ICD-10-CM | POA: Diagnosis not present

## 2022-12-20 NOTE — Telephone Encounter (Signed)
Attempted to reach patient to schedule PD catheter removal per Dr. Adin Hector request. Left voicemail message for patient to return call.

## 2022-12-22 ENCOUNTER — Other Ambulatory Visit: Payer: Self-pay

## 2022-12-22 ENCOUNTER — Encounter (HOSPITAL_COMMUNITY): Payer: Self-pay | Admitting: Surgery

## 2022-12-22 DIAGNOSIS — D631 Anemia in chronic kidney disease: Secondary | ICD-10-CM | POA: Diagnosis not present

## 2022-12-22 DIAGNOSIS — D689 Coagulation defect, unspecified: Secondary | ICD-10-CM | POA: Diagnosis not present

## 2022-12-22 DIAGNOSIS — Z992 Dependence on renal dialysis: Secondary | ICD-10-CM | POA: Diagnosis not present

## 2022-12-22 DIAGNOSIS — E119 Type 2 diabetes mellitus without complications: Secondary | ICD-10-CM | POA: Diagnosis not present

## 2022-12-22 DIAGNOSIS — N2581 Secondary hyperparathyroidism of renal origin: Secondary | ICD-10-CM | POA: Diagnosis not present

## 2022-12-22 DIAGNOSIS — N186 End stage renal disease: Secondary | ICD-10-CM | POA: Diagnosis not present

## 2022-12-22 NOTE — Progress Notes (Signed)
Spoke with pt for pre-op call. Pt denies cardiac history. Pt is treated for HTN and CKD. Pt has hx of Diabetes, his last A1C ws 5.3 on 08/10/22. He does not check his blood sugar at home.   Shower/bath instructions given to pt.

## 2022-12-23 ENCOUNTER — Encounter (HOSPITAL_COMMUNITY): Payer: Self-pay | Admitting: Surgery

## 2022-12-23 ENCOUNTER — Other Ambulatory Visit: Payer: Self-pay

## 2022-12-23 ENCOUNTER — Ambulatory Visit (HOSPITAL_COMMUNITY)
Admission: RE | Admit: 2022-12-23 | Discharge: 2022-12-23 | Disposition: A | Discharge status: Home / Self Care | Payer: Medicare HMO | Attending: Surgery | Admitting: Surgery

## 2022-12-23 ENCOUNTER — Ambulatory Visit (HOSPITAL_BASED_OUTPATIENT_CLINIC_OR_DEPARTMENT_OTHER): Payer: Medicare HMO | Admitting: Anesthesiology

## 2022-12-23 ENCOUNTER — Ambulatory Visit (HOSPITAL_COMMUNITY): Payer: Medicare HMO | Admitting: Anesthesiology

## 2022-12-23 ENCOUNTER — Encounter (HOSPITAL_COMMUNITY)
Admission: RE | Disposition: A | Discharge status: Home / Self Care | Payer: Self-pay | Source: Home / Self Care | Attending: Surgery

## 2022-12-23 DIAGNOSIS — Z992 Dependence on renal dialysis: Secondary | ICD-10-CM | POA: Insufficient documentation

## 2022-12-23 DIAGNOSIS — E1122 Type 2 diabetes mellitus with diabetic chronic kidney disease: Secondary | ICD-10-CM | POA: Insufficient documentation

## 2022-12-23 DIAGNOSIS — N185 Chronic kidney disease, stage 5: Secondary | ICD-10-CM | POA: Diagnosis not present

## 2022-12-23 DIAGNOSIS — I132 Hypertensive heart and chronic kidney disease with heart failure and with stage 5 chronic kidney disease, or end stage renal disease: Secondary | ICD-10-CM

## 2022-12-23 DIAGNOSIS — N186 End stage renal disease: Secondary | ICD-10-CM | POA: Diagnosis not present

## 2022-12-23 DIAGNOSIS — I509 Heart failure, unspecified: Secondary | ICD-10-CM

## 2022-12-23 HISTORY — PX: CAPD REMOVAL: SHX5234

## 2022-12-23 LAB — POCT I-STAT, CHEM 8
BUN: 29 mg/dL — ABNORMAL HIGH (ref 8–23)
Calcium, Ion: 0.98 mmol/L — ABNORMAL LOW (ref 1.15–1.40)
Chloride: 97 mmol/L — ABNORMAL LOW (ref 98–111)
Creatinine, Ser: 4.6 mg/dL — ABNORMAL HIGH (ref 0.61–1.24)
Glucose, Bld: 117 mg/dL — ABNORMAL HIGH (ref 70–99)
HCT: 39 % (ref 39.0–52.0)
Hemoglobin: 13.3 g/dL (ref 13.0–17.0)
Potassium: 3.6 mmol/L (ref 3.5–5.1)
Sodium: 139 mmol/L (ref 135–145)
TCO2: 30 mmol/L (ref 22–32)

## 2022-12-23 LAB — GLUCOSE, CAPILLARY
Glucose-Capillary: 106 mg/dL — ABNORMAL HIGH (ref 70–99)
Glucose-Capillary: 94 mg/dL (ref 70–99)

## 2022-12-23 SURGERY — LAPAROSCOPIC REMOVAL CONTINUOUS AMBULATORY PERITONEAL DIALYSIS  (CAPD) CATHETER
Anesthesia: General | Site: Abdomen

## 2022-12-23 MED ORDER — SODIUM CHLORIDE 0.9 % IV SOLN: INTRAVENOUS | Status: DC

## 2022-12-23 MED ORDER — OXYCODONE HCL 5 MG/5ML PO SOLN: 5.0000 mg | Freq: Once | ORAL | Status: AC | PRN

## 2022-12-23 MED ORDER — LIDOCAINE 2% (20 MG/ML) 5 ML SYRINGE
INTRAMUSCULAR | Status: DC | PRN
  Administered 2022-12-23: 60 mg via INTRAVENOUS

## 2022-12-23 MED ORDER — LIDOCAINE-EPINEPHRINE (PF) 1 %-1:200000 IJ SOLN
INTRAMUSCULAR | Status: DC | PRN
  Administered 2022-12-23: 10 mL

## 2022-12-23 MED ORDER — LIDOCAINE 2% (20 MG/ML) 5 ML SYRINGE
INTRAMUSCULAR | Status: AC
  Filled 2022-12-23: qty 10

## 2022-12-23 MED ORDER — OXYCODONE-ACETAMINOPHEN 5-325 MG PO TABS: 1.0000 | ORAL_TABLET | Freq: Four times a day (QID) | ORAL | 0 refills | Status: DC | PRN

## 2022-12-23 MED ORDER — FENTANYL CITRATE (PF) 100 MCG/2ML IJ SOLN
INTRAMUSCULAR | Status: AC
  Filled 2022-12-23: qty 2

## 2022-12-23 MED ORDER — PROPOFOL 10 MG/ML IV BOLUS
INTRAVENOUS | Status: DC | PRN
  Administered 2022-12-23: 150 mg via INTRAVENOUS

## 2022-12-23 MED ORDER — CEFAZOLIN SODIUM-DEXTROSE 2-4 GM/100ML-% IV SOLN
2.0000 g | INTRAVENOUS | Status: AC
  Administered 2022-12-23: 2 g via INTRAVENOUS
  Filled 2022-12-23: qty 100

## 2022-12-23 MED ORDER — DEXAMETHASONE SODIUM PHOSPHATE 10 MG/ML IJ SOLN
INTRAMUSCULAR | Status: AC
  Filled 2022-12-23: qty 2

## 2022-12-23 MED ORDER — EPHEDRINE 5 MG/ML INJ
INTRAVENOUS | Status: AC
  Filled 2022-12-23: qty 10

## 2022-12-23 MED ORDER — CHLORHEXIDINE GLUCONATE 0.12 % MT SOLN
15.0000 mL | Freq: Once | OROMUCOSAL | Status: AC
  Administered 2022-12-23: 15 mL via OROMUCOSAL
  Filled 2022-12-23: qty 15

## 2022-12-23 MED ORDER — DEXAMETHASONE SODIUM PHOSPHATE 10 MG/ML IJ SOLN
INTRAMUSCULAR | Status: DC | PRN
  Administered 2022-12-23: 10 mg via INTRAVENOUS

## 2022-12-23 MED ORDER — ORAL CARE MOUTH RINSE: 15.0000 mL | Freq: Once | OROMUCOSAL | Status: AC

## 2022-12-23 MED ORDER — CHLORHEXIDINE GLUCONATE 4 % EX LIQD: 60.0000 mL | Freq: Once | CUTANEOUS | Status: DC

## 2022-12-23 MED ORDER — ONDANSETRON HCL 4 MG/2ML IJ SOLN
INTRAMUSCULAR | Status: DC | PRN
  Administered 2022-12-23: 4 mg via INTRAVENOUS

## 2022-12-23 MED ORDER — OXYCODONE HCL 5 MG PO TABS
5.0000 mg | ORAL_TABLET | Freq: Once | ORAL | Status: AC | PRN
  Administered 2022-12-23: 5 mg via ORAL

## 2022-12-23 MED ORDER — PHENYLEPHRINE 80 MCG/ML (10ML) SYRINGE FOR IV PUSH (FOR BLOOD PRESSURE SUPPORT)
PREFILLED_SYRINGE | INTRAVENOUS | Status: DC | PRN
  Administered 2022-12-23 (×2): 160 ug via INTRAVENOUS

## 2022-12-23 MED ORDER — OXYCODONE HCL 5 MG PO TABS
ORAL_TABLET | ORAL | Status: AC
  Filled 2022-12-23: qty 1

## 2022-12-23 MED ORDER — MIDAZOLAM HCL 2 MG/2ML IJ SOLN
INTRAMUSCULAR | Status: AC
  Filled 2022-12-23: qty 2

## 2022-12-23 MED ORDER — LACTATED RINGERS IV SOLN: INTRAVENOUS | Status: DC | PRN

## 2022-12-23 MED ORDER — SODIUM CHLORIDE 0.9 % IR SOLN
Status: DC | PRN
  Administered 2022-12-23: 1

## 2022-12-23 MED ORDER — ONDANSETRON HCL 4 MG/2ML IJ SOLN: 4.0000 mg | Freq: Four times a day (QID) | INTRAMUSCULAR | Status: DC | PRN

## 2022-12-23 MED ORDER — LIDOCAINE-EPINEPHRINE (PF) 1 %-1:200000 IJ SOLN
INTRAMUSCULAR | Status: AC
  Filled 2022-12-23: qty 30

## 2022-12-23 MED ORDER — FENTANYL CITRATE (PF) 100 MCG/2ML IJ SOLN: 25.0000 ug | INTRAMUSCULAR | Status: DC | PRN

## 2022-12-23 MED ORDER — INSULIN ASPART 100 UNIT/ML IJ SOLN: 0.0000 [IU] | INTRAMUSCULAR | Status: DC | PRN

## 2022-12-23 MED ORDER — PROPOFOL 10 MG/ML IV BOLUS
INTRAVENOUS | Status: AC
  Filled 2022-12-23: qty 20

## 2022-12-23 MED ORDER — ONDANSETRON HCL 4 MG/2ML IJ SOLN
INTRAMUSCULAR | Status: AC
  Filled 2022-12-23: qty 4

## 2022-12-23 MED ORDER — FENTANYL CITRATE (PF) 250 MCG/5ML IJ SOLN
INTRAMUSCULAR | Status: DC | PRN
  Administered 2022-12-23 (×2): 50 ug via INTRAVENOUS

## 2022-12-23 MED ORDER — FENTANYL CITRATE (PF) 250 MCG/5ML IJ SOLN
INTRAMUSCULAR | Status: AC
  Filled 2022-12-23: qty 5

## 2022-12-23 SURGICAL SUPPLY — 46 items
APL PRP STRL LF DISP 70% ISPRP (MISCELLANEOUS) ×1
BAG COUNTER SPONGE SURGICOUNT (BAG) ×1 IMPLANT
BAG SPNG CNTER NS LX DISP (BAG) ×1
BLADE CLIPPER SURG (BLADE) IMPLANT
CANISTER SUCT 3000ML PPV (MISCELLANEOUS) IMPLANT
CHLORAPREP W/TINT 26 (MISCELLANEOUS) ×1 IMPLANT
COVER PROBE CYLINDRICAL 5X96 (MISCELLANEOUS) IMPLANT
COVER SURGICAL LIGHT HANDLE (MISCELLANEOUS) ×1 IMPLANT
DERMABOND ADVANCED .7 DNX12 (GAUZE/BANDAGES/DRESSINGS) ×1 IMPLANT
DISSECTOR BLUNT TIP ENDO 5MM (MISCELLANEOUS) IMPLANT
DRAPE LAPAROSCOPIC ABDOMINAL (DRAPES) IMPLANT
DRSG TEGADERM 4X4.75 (GAUZE/BANDAGES/DRESSINGS) IMPLANT
ELECT REM PT RETURN 9FT ADLT (ELECTROSURGICAL) ×1
ELECTRODE REM PT RTRN 9FT ADLT (ELECTROSURGICAL) ×1 IMPLANT
GAUZE SPONGE 2X2 8PLY STRL LF (GAUZE/BANDAGES/DRESSINGS) IMPLANT
GAUZE SPONGE 2X2 STRL 8-PLY (GAUZE/BANDAGES/DRESSINGS) IMPLANT
GLOVE BIOGEL PI IND STRL 7.5 (GLOVE) ×1 IMPLANT
GOWN STRL REUS W/ TWL LRG LVL3 (GOWN DISPOSABLE) ×2 IMPLANT
GOWN STRL REUS W/ TWL XL LVL3 (GOWN DISPOSABLE) ×1 IMPLANT
GOWN STRL REUS W/TWL LRG LVL3 (GOWN DISPOSABLE) ×2
GOWN STRL REUS W/TWL XL LVL3 (GOWN DISPOSABLE) ×1
IRRIG SUCT STRYKERFLOW 2 WTIP (MISCELLANEOUS)
IRRIGATION SUCT STRKRFLW 2 WTP (MISCELLANEOUS) IMPLANT
KIT BASIN OR (CUSTOM PROCEDURE TRAY) ×1 IMPLANT
KIT TURNOVER KIT B (KITS) ×1 IMPLANT
NDL HYPO 25GX1X1/2 BEV (NEEDLE) IMPLANT
NEEDLE HYPO 25GX1X1/2 BEV (NEEDLE) ×1 IMPLANT
NS IRRIG 1000ML POUR BTL (IV SOLUTION) ×1 IMPLANT
PACK GENERAL/GYN (CUSTOM PROCEDURE TRAY) ×1 IMPLANT
PAD ARMBOARD 7.5X6 YLW CONV (MISCELLANEOUS) ×1 IMPLANT
SET TUBE SMOKE EVAC HIGH FLOW (TUBING) ×1 IMPLANT
SLEEVE Z-THREAD 5X100MM (TROCAR) IMPLANT
SPIKE FLUID TRANSFER (MISCELLANEOUS) ×1 IMPLANT
SUT ETHILON 5 0 PS 2 18 (SUTURE) IMPLANT
SUT MNCRL AB 4-0 PS2 18 (SUTURE) IMPLANT
SUT MON AB 5-0 PS2 18 (SUTURE) IMPLANT
SUT VIC AB 3-0 SH 27 (SUTURE) ×2
SUT VIC AB 3-0 SH 27X BRD (SUTURE) ×2 IMPLANT
SUT VICRYL 0 UR6 27IN ABS (SUTURE) ×2 IMPLANT
SUT VICRYL 4-0 PS2 18IN ABS (SUTURE) ×2 IMPLANT
SYR CONTROL 10ML LL (SYRINGE) IMPLANT
TOWEL GREEN STERILE (TOWEL DISPOSABLE) ×1 IMPLANT
TOWEL GREEN STERILE FF (TOWEL DISPOSABLE) ×1 IMPLANT
TRAY LAPAROSCOPIC MC (CUSTOM PROCEDURE TRAY) ×1 IMPLANT
TROCAR Z THREAD OPTICAL 12X100 (TROCAR) ×1 IMPLANT
WATER STERILE IRR 1000ML POUR (IV SOLUTION) ×1 IMPLANT

## 2022-12-23 NOTE — Anesthesia Procedure Notes (Signed)
Procedure Name: LMA Insertion Date/Time: 12/23/2022 10:45 AM  Performed by: Minerva Ends, CRNAPre-anesthesia Checklist: Patient identified, Emergency Drugs available, Suction available and Patient being monitored Patient Re-evaluated:Patient Re-evaluated prior to induction Oxygen Delivery Method: Circle system utilized Preoxygenation: Pre-oxygenation with 100% oxygen Induction Type: IV induction Ventilation: Mask ventilation without difficulty LMA: LMA inserted LMA Size: 4.0 Tube type: Oral Number of attempts: 1 Placement Confirmation: positive ETCO2 and breath sounds checked- equal and bilateral Tube secured with: Tape Dental Injury: Teeth and Oropharynx as per pre-operative assessment

## 2022-12-23 NOTE — H&P (Signed)
   Patient name: Ruben Camacho. MRN: [id number] DOB: 1956-06-05 Sex: male     HISTORY OF PRESENT ILLNESS:   Ruben Camacho. is a 67 y.o. male with ESRD who had a PD catherer placed in November. He is here today for removal  CURRENT MEDICATIONS:    Current Facility-Administered Medications  Medication Dose Route Frequency Provider Last Rate Last Admin   0.9 %  sodium chloride infusion   Intravenous Continuous Mariette Cowley, Butch Penny, MD       0.9 %  sodium chloride infusion   Intravenous Continuous Albertha Ghee, MD 10 mL/hr at 12/23/22 0807 New Bag at 12/23/22 0807   ceFAZolin (ANCEF) IVPB 2g/100 mL premix  2 g Intravenous 30 min Pre-Op Serafina Mitchell, MD       chlorhexidine (HIBICLENS) 4 % liquid 4 Application  60 mL Topical Once Serafina Mitchell, MD       And   Derrill Memo ON 12/24/2022] chlorhexidine (HIBICLENS) 4 % liquid 4 Application  60 mL Topical Once Serafina Mitchell, MD       fentaNYL (SUBLIMAZE) 100 MCG/2ML injection            insulin aspart (novoLOG) injection 0-7 Units  0-7 Units Subcutaneous Q2H PRN Hodierne, Adam, MD       midazolam (VERSED) 2 MG/2ML injection             REVIEW OF SYSTEMS:   [X]$  denotes positive finding, [ ]$  denotes negative finding Cardiac  Comments:  Chest pain or chest pressure:    Shortness of breath upon exertion:    Short of breath when lying flat:    Irregular heart rhythm:    Constitutional    Fever or chills:      PHYSICAL EXAM:   Vitals:   12/23/22 0754  BP: 137/76  Pulse: 71  Resp: 18  Temp: 97.7 F (36.5 C)  TempSrc: Oral  SpO2: 99%  Weight: 77.1 kg  Height: 5' 9"$  (1.753 m)    GENERAL: The patient is a well-nourished male, in no acute distress. The vital signs are documented above. CARDIOVASCULAR: There is a regular rate and rhythm. PULMONARY: Non-labored respirations   STUDIES:      MEDICAL ISSUES:   Plan for PD catheter removal.  All questions answered  Leia Alf, MD,  FACS [facility] [phone] Pager [phone]

## 2022-12-23 NOTE — Transfer of Care (Addendum)
Immediate Anesthesia Transfer of Care Note  Patient: Ruben Camacho.  Procedure(s) Performed: REMOVAL CONTINUOUS AMBULATORY PERITONEAL DIALYSIS  (CAPD) CATHETER (Abdomen)  Patient Location: PACU  Anesthesia Type:MAC  Level of Consciousness: sedated  Airway & Oxygen Therapy: Patient Spontanous Breathing  Post-op Assessment: Report given to RN and Post -op Vital signs reviewed and stable  Post vital signs: Reviewed and stable  Last Vitals:  Vitals Value Taken Time  BP 105/63 12/23/22 1134  Temp 36.6 C 12/23/22 1120  Pulse 60 12/23/22 1135  Resp 13 12/23/22 1135  SpO2 95 % 12/23/22 1135  Vitals shown include unvalidated device data.  Last Pain:  Vitals:   12/23/22 1120  TempSrc:   PainSc: Asleep         Complications: No notable events documented.

## 2022-12-23 NOTE — Anesthesia Preprocedure Evaluation (Signed)
Anesthesia Evaluation  Patient identified by MRN, date of birth, ID band Patient awake    Reviewed: Allergy & Precautions, H&P , NPO status , Patient's Chart, lab work & pertinent test results  Airway Mallampati: II   Neck ROM: full    Dental   Pulmonary neg pulmonary ROS   breath sounds clear to auscultation       Cardiovascular hypertension, +CHF   Rhythm:regular Rate:Normal     Neuro/Psych  Headaches CVA    GI/Hepatic   Endo/Other  diabetes, Type 2    Renal/GU ESRFRenal disease     Musculoskeletal   Abdominal   Peds  Hematology   Anesthesia Other Findings   Reproductive/Obstetrics                             Anesthesia Physical Anesthesia Plan  ASA: 3  Anesthesia Plan: General   Post-op Pain Management:    Induction: Intravenous  PONV Risk Score and Plan: 2 and Ondansetron, Dexamethasone and Treatment may vary due to age or medical condition  Airway Management Planned: Oral ETT  Additional Equipment:   Intra-op Plan:   Post-operative Plan: Extubation in OR  Informed Consent: I have reviewed the patients History and Physical, chart, labs and discussed the procedure including the risks, benefits and alternatives for the proposed anesthesia with the patient or authorized representative who has indicated his/her understanding and acceptance.     Dental advisory given  Plan Discussed with: CRNA, Anesthesiologist and Surgeon  Anesthesia Plan Comments:        Anesthesia Quick Evaluation

## 2022-12-23 NOTE — Op Note (Signed)
    Patient name: Ruben Camacho. MRN: [id number] DOB: [birth year] Sex: male  12/23/2022 Pre-operative Diagnosis: ESRD Post-operative diagnosis:  Same Surgeon:  Annamarie Major Assistants:  none Procedure:   removal of peritoneal dialysis catheter Anesthesia:  General Blood Loss:  minimal Specimens:  none  Findings:  intact catheter removal  Indications: This is a 67 year old gentleman who has recently had a peritoneal catheter placed for dialysis.  He has decided that he wants to do hemodialysis and wants his catheter removed.  Procedure:  The patient was identified in the holding area and taken to [place] [address]  The patient was then placed supine on the table. general anesthesia was administered.  The patient was prepped and draped in the usual sterile fashion.  A time out was called and antibiotics were administered.  I first began by mobilizing the cuff at the skin exit site in the left mid axillary line.  Once this cuff was fully mobilized, I made an incision in the subxiphoid area over top of the palpable cuff.  This cuff was then dissected free and fully mobilized.  I then used ultrasound to identify the third cuff just before it entered the fascia.  A vertical incision was made in the midline below the umbilicus over this cuff.  I then identified the cuff and fully mobilized it.  The intra-abdominal contents of the catheter was then removed.  The remaining portion of the catheter including the metal connector were completely removed.  The wounds were irrigated.  The anterior fascia and the infraumbilical incision was closed with a UR 6 suture.  Both incisions were closed with a layer 3-0 Vicryl followed by 4-0 Monocryl.  Dermabond was placed on these incisions.  A sterile dressing was placed over the catheter exit site.  The patient was successfully extubated from anesthesia and taken recovery in stable condition.   Disposition: To PACU stable.   Theotis Burrow, M.D., [facility] [place] Office: [phone] Pager:  [phone]

## 2022-12-23 NOTE — Anesthesia Postprocedure Evaluation (Signed)
Anesthesia Post Note  Patient: Ruben Camacho.  Procedure(s) Performed: REMOVAL CONTINUOUS AMBULATORY PERITONEAL DIALYSIS  (CAPD) CATHETER (Abdomen)     Patient location during evaluation: PACU Anesthesia Type: General Level of consciousness: awake and alert and oriented Pain management: pain level controlled Vital Signs Assessment: post-procedure vital signs reviewed and stable Respiratory status: spontaneous breathing, nonlabored ventilation and respiratory function stable Cardiovascular status: blood pressure returned to baseline and stable Postop Assessment: no apparent nausea or vomiting Anesthetic complications: no   No notable events documented.  Last Vitals:  Vitals:   12/23/22 1120 12/23/22 1135  BP: 93/68 105/63  Pulse: 61 67  Resp: 15 15  Temp: 36.6 C   SpO2: 94% 97%    Last Pain:  Vitals:   12/23/22 1135  TempSrc:   PainSc: 2                  Liev Brockbank A.

## 2022-12-24 ENCOUNTER — Encounter (HOSPITAL_COMMUNITY): Payer: Self-pay | Admitting: Surgery

## 2022-12-27 DIAGNOSIS — D689 Coagulation defect, unspecified: Secondary | ICD-10-CM | POA: Diagnosis not present

## 2022-12-27 DIAGNOSIS — N2581 Secondary hyperparathyroidism of renal origin: Secondary | ICD-10-CM | POA: Diagnosis not present

## 2022-12-27 DIAGNOSIS — D631 Anemia in chronic kidney disease: Secondary | ICD-10-CM | POA: Diagnosis not present

## 2022-12-27 DIAGNOSIS — Z992 Dependence on renal dialysis: Secondary | ICD-10-CM | POA: Diagnosis not present

## 2022-12-27 DIAGNOSIS — N186 End stage renal disease: Secondary | ICD-10-CM | POA: Diagnosis not present

## 2022-12-27 DIAGNOSIS — E119 Type 2 diabetes mellitus without complications: Secondary | ICD-10-CM | POA: Diagnosis not present

## 2022-12-29 DIAGNOSIS — E119 Type 2 diabetes mellitus without complications: Secondary | ICD-10-CM | POA: Diagnosis not present

## 2022-12-29 DIAGNOSIS — Z992 Dependence on renal dialysis: Secondary | ICD-10-CM | POA: Diagnosis not present

## 2022-12-29 DIAGNOSIS — N186 End stage renal disease: Secondary | ICD-10-CM | POA: Diagnosis not present

## 2022-12-29 DIAGNOSIS — D631 Anemia in chronic kidney disease: Secondary | ICD-10-CM | POA: Diagnosis not present

## 2022-12-29 DIAGNOSIS — D689 Coagulation defect, unspecified: Secondary | ICD-10-CM | POA: Diagnosis not present

## 2022-12-29 DIAGNOSIS — N2581 Secondary hyperparathyroidism of renal origin: Secondary | ICD-10-CM | POA: Diagnosis not present

## 2022-12-29 DIAGNOSIS — E1122 Type 2 diabetes mellitus with diabetic chronic kidney disease: Secondary | ICD-10-CM | POA: Diagnosis not present

## 2022-12-31 DIAGNOSIS — N2581 Secondary hyperparathyroidism of renal origin: Secondary | ICD-10-CM | POA: Diagnosis not present

## 2022-12-31 DIAGNOSIS — N186 End stage renal disease: Secondary | ICD-10-CM | POA: Diagnosis not present

## 2022-12-31 DIAGNOSIS — Z992 Dependence on renal dialysis: Secondary | ICD-10-CM | POA: Diagnosis not present

## 2022-12-31 DIAGNOSIS — D689 Coagulation defect, unspecified: Secondary | ICD-10-CM | POA: Diagnosis not present

## 2022-12-31 DIAGNOSIS — D631 Anemia in chronic kidney disease: Secondary | ICD-10-CM | POA: Diagnosis not present

## 2022-12-31 DIAGNOSIS — D509 Iron deficiency anemia, unspecified: Secondary | ICD-10-CM | POA: Diagnosis not present

## 2023-01-03 DIAGNOSIS — D509 Iron deficiency anemia, unspecified: Secondary | ICD-10-CM | POA: Diagnosis not present

## 2023-01-03 DIAGNOSIS — D689 Coagulation defect, unspecified: Secondary | ICD-10-CM | POA: Diagnosis not present

## 2023-01-03 DIAGNOSIS — N2581 Secondary hyperparathyroidism of renal origin: Secondary | ICD-10-CM | POA: Diagnosis not present

## 2023-01-03 DIAGNOSIS — D631 Anemia in chronic kidney disease: Secondary | ICD-10-CM | POA: Diagnosis not present

## 2023-01-03 DIAGNOSIS — N186 End stage renal disease: Secondary | ICD-10-CM | POA: Diagnosis not present

## 2023-01-03 DIAGNOSIS — Z992 Dependence on renal dialysis: Secondary | ICD-10-CM | POA: Diagnosis not present

## 2023-01-05 DIAGNOSIS — N2581 Secondary hyperparathyroidism of renal origin: Secondary | ICD-10-CM | POA: Diagnosis not present

## 2023-01-05 DIAGNOSIS — Z992 Dependence on renal dialysis: Secondary | ICD-10-CM | POA: Diagnosis not present

## 2023-01-05 DIAGNOSIS — D631 Anemia in chronic kidney disease: Secondary | ICD-10-CM | POA: Diagnosis not present

## 2023-01-05 DIAGNOSIS — D689 Coagulation defect, unspecified: Secondary | ICD-10-CM | POA: Diagnosis not present

## 2023-01-05 DIAGNOSIS — D509 Iron deficiency anemia, unspecified: Secondary | ICD-10-CM | POA: Diagnosis not present

## 2023-01-05 DIAGNOSIS — N186 End stage renal disease: Secondary | ICD-10-CM | POA: Diagnosis not present

## 2023-01-07 DIAGNOSIS — D689 Coagulation defect, unspecified: Secondary | ICD-10-CM | POA: Diagnosis not present

## 2023-01-07 DIAGNOSIS — N2581 Secondary hyperparathyroidism of renal origin: Secondary | ICD-10-CM | POA: Diagnosis not present

## 2023-01-07 DIAGNOSIS — Z992 Dependence on renal dialysis: Secondary | ICD-10-CM | POA: Diagnosis not present

## 2023-01-07 DIAGNOSIS — D631 Anemia in chronic kidney disease: Secondary | ICD-10-CM | POA: Diagnosis not present

## 2023-01-07 DIAGNOSIS — N186 End stage renal disease: Secondary | ICD-10-CM | POA: Diagnosis not present

## 2023-01-07 DIAGNOSIS — D509 Iron deficiency anemia, unspecified: Secondary | ICD-10-CM | POA: Diagnosis not present

## 2023-01-10 DIAGNOSIS — D631 Anemia in chronic kidney disease: Secondary | ICD-10-CM | POA: Diagnosis not present

## 2023-01-10 DIAGNOSIS — D689 Coagulation defect, unspecified: Secondary | ICD-10-CM | POA: Diagnosis not present

## 2023-01-10 DIAGNOSIS — Z992 Dependence on renal dialysis: Secondary | ICD-10-CM | POA: Diagnosis not present

## 2023-01-10 DIAGNOSIS — N2581 Secondary hyperparathyroidism of renal origin: Secondary | ICD-10-CM | POA: Diagnosis not present

## 2023-01-10 DIAGNOSIS — D509 Iron deficiency anemia, unspecified: Secondary | ICD-10-CM | POA: Diagnosis not present

## 2023-01-10 DIAGNOSIS — N186 End stage renal disease: Secondary | ICD-10-CM | POA: Diagnosis not present

## 2023-01-12 DIAGNOSIS — Z992 Dependence on renal dialysis: Secondary | ICD-10-CM | POA: Diagnosis not present

## 2023-01-12 DIAGNOSIS — N186 End stage renal disease: Secondary | ICD-10-CM | POA: Diagnosis not present

## 2023-01-12 DIAGNOSIS — D689 Coagulation defect, unspecified: Secondary | ICD-10-CM | POA: Diagnosis not present

## 2023-01-12 DIAGNOSIS — D509 Iron deficiency anemia, unspecified: Secondary | ICD-10-CM | POA: Diagnosis not present

## 2023-01-12 DIAGNOSIS — D631 Anemia in chronic kidney disease: Secondary | ICD-10-CM | POA: Diagnosis not present

## 2023-01-12 DIAGNOSIS — N2581 Secondary hyperparathyroidism of renal origin: Secondary | ICD-10-CM | POA: Diagnosis not present

## 2023-01-14 DIAGNOSIS — D509 Iron deficiency anemia, unspecified: Secondary | ICD-10-CM | POA: Diagnosis not present

## 2023-01-14 DIAGNOSIS — D631 Anemia in chronic kidney disease: Secondary | ICD-10-CM | POA: Diagnosis not present

## 2023-01-14 DIAGNOSIS — N186 End stage renal disease: Secondary | ICD-10-CM | POA: Diagnosis not present

## 2023-01-14 DIAGNOSIS — N2581 Secondary hyperparathyroidism of renal origin: Secondary | ICD-10-CM | POA: Diagnosis not present

## 2023-01-14 DIAGNOSIS — Z992 Dependence on renal dialysis: Secondary | ICD-10-CM | POA: Diagnosis not present

## 2023-01-14 DIAGNOSIS — D689 Coagulation defect, unspecified: Secondary | ICD-10-CM | POA: Diagnosis not present

## 2023-01-17 DIAGNOSIS — N2581 Secondary hyperparathyroidism of renal origin: Secondary | ICD-10-CM | POA: Diagnosis not present

## 2023-01-17 DIAGNOSIS — D509 Iron deficiency anemia, unspecified: Secondary | ICD-10-CM | POA: Diagnosis not present

## 2023-01-17 DIAGNOSIS — N186 End stage renal disease: Secondary | ICD-10-CM | POA: Diagnosis not present

## 2023-01-17 DIAGNOSIS — D689 Coagulation defect, unspecified: Secondary | ICD-10-CM | POA: Diagnosis not present

## 2023-01-17 DIAGNOSIS — D631 Anemia in chronic kidney disease: Secondary | ICD-10-CM | POA: Diagnosis not present

## 2023-01-17 DIAGNOSIS — Z992 Dependence on renal dialysis: Secondary | ICD-10-CM | POA: Diagnosis not present

## 2023-01-19 DIAGNOSIS — D631 Anemia in chronic kidney disease: Secondary | ICD-10-CM | POA: Diagnosis not present

## 2023-01-19 DIAGNOSIS — D509 Iron deficiency anemia, unspecified: Secondary | ICD-10-CM | POA: Diagnosis not present

## 2023-01-19 DIAGNOSIS — D689 Coagulation defect, unspecified: Secondary | ICD-10-CM | POA: Diagnosis not present

## 2023-01-19 DIAGNOSIS — N186 End stage renal disease: Secondary | ICD-10-CM | POA: Diagnosis not present

## 2023-01-19 DIAGNOSIS — N2581 Secondary hyperparathyroidism of renal origin: Secondary | ICD-10-CM | POA: Diagnosis not present

## 2023-01-19 DIAGNOSIS — Z992 Dependence on renal dialysis: Secondary | ICD-10-CM | POA: Diagnosis not present

## 2023-01-24 DIAGNOSIS — D689 Coagulation defect, unspecified: Secondary | ICD-10-CM | POA: Diagnosis not present

## 2023-01-24 DIAGNOSIS — Z992 Dependence on renal dialysis: Secondary | ICD-10-CM | POA: Diagnosis not present

## 2023-01-24 DIAGNOSIS — D631 Anemia in chronic kidney disease: Secondary | ICD-10-CM | POA: Diagnosis not present

## 2023-01-24 DIAGNOSIS — N186 End stage renal disease: Secondary | ICD-10-CM | POA: Diagnosis not present

## 2023-01-24 DIAGNOSIS — N2581 Secondary hyperparathyroidism of renal origin: Secondary | ICD-10-CM | POA: Diagnosis not present

## 2023-01-24 DIAGNOSIS — D509 Iron deficiency anemia, unspecified: Secondary | ICD-10-CM | POA: Diagnosis not present

## 2023-01-26 DIAGNOSIS — D689 Coagulation defect, unspecified: Secondary | ICD-10-CM | POA: Diagnosis not present

## 2023-01-26 DIAGNOSIS — D631 Anemia in chronic kidney disease: Secondary | ICD-10-CM | POA: Diagnosis not present

## 2023-01-26 DIAGNOSIS — N2581 Secondary hyperparathyroidism of renal origin: Secondary | ICD-10-CM | POA: Diagnosis not present

## 2023-01-26 DIAGNOSIS — D509 Iron deficiency anemia, unspecified: Secondary | ICD-10-CM | POA: Diagnosis not present

## 2023-01-26 DIAGNOSIS — N186 End stage renal disease: Secondary | ICD-10-CM | POA: Diagnosis not present

## 2023-01-26 DIAGNOSIS — Z992 Dependence on renal dialysis: Secondary | ICD-10-CM | POA: Diagnosis not present

## 2023-01-28 DIAGNOSIS — N186 End stage renal disease: Secondary | ICD-10-CM | POA: Diagnosis not present

## 2023-01-28 DIAGNOSIS — D631 Anemia in chronic kidney disease: Secondary | ICD-10-CM | POA: Diagnosis not present

## 2023-01-28 DIAGNOSIS — N2581 Secondary hyperparathyroidism of renal origin: Secondary | ICD-10-CM | POA: Diagnosis not present

## 2023-01-28 DIAGNOSIS — Z992 Dependence on renal dialysis: Secondary | ICD-10-CM | POA: Diagnosis not present

## 2023-01-28 DIAGNOSIS — D509 Iron deficiency anemia, unspecified: Secondary | ICD-10-CM | POA: Diagnosis not present

## 2023-01-28 DIAGNOSIS — D689 Coagulation defect, unspecified: Secondary | ICD-10-CM | POA: Diagnosis not present

## 2023-01-29 DIAGNOSIS — N186 End stage renal disease: Secondary | ICD-10-CM | POA: Diagnosis not present

## 2023-01-29 DIAGNOSIS — Z992 Dependence on renal dialysis: Secondary | ICD-10-CM | POA: Diagnosis not present

## 2023-01-29 DIAGNOSIS — E1122 Type 2 diabetes mellitus with diabetic chronic kidney disease: Secondary | ICD-10-CM | POA: Diagnosis not present

## 2023-01-31 ENCOUNTER — Ambulatory Visit: Payer: Medicare HMO | Attending: Cardiovascular Disease | Admitting: Cardiovascular Disease

## 2023-01-31 ENCOUNTER — Encounter: Payer: Self-pay | Admitting: Cardiovascular Disease

## 2023-01-31 VITALS — BP 118/60 | HR 83 | Ht 69.0 in | Wt 156.8 lb

## 2023-01-31 DIAGNOSIS — D689 Coagulation defect, unspecified: Secondary | ICD-10-CM | POA: Diagnosis not present

## 2023-01-31 DIAGNOSIS — D509 Iron deficiency anemia, unspecified: Secondary | ICD-10-CM | POA: Diagnosis not present

## 2023-01-31 DIAGNOSIS — E119 Type 2 diabetes mellitus without complications: Secondary | ICD-10-CM | POA: Diagnosis not present

## 2023-01-31 DIAGNOSIS — R55 Syncope and collapse: Secondary | ICD-10-CM | POA: Insufficient documentation

## 2023-01-31 DIAGNOSIS — D631 Anemia in chronic kidney disease: Secondary | ICD-10-CM | POA: Diagnosis not present

## 2023-01-31 DIAGNOSIS — N2581 Secondary hyperparathyroidism of renal origin: Secondary | ICD-10-CM | POA: Diagnosis not present

## 2023-01-31 DIAGNOSIS — N186 End stage renal disease: Secondary | ICD-10-CM | POA: Diagnosis not present

## 2023-01-31 DIAGNOSIS — Z992 Dependence on renal dialysis: Secondary | ICD-10-CM | POA: Diagnosis not present

## 2023-01-31 DIAGNOSIS — R52 Pain, unspecified: Secondary | ICD-10-CM | POA: Diagnosis not present

## 2023-01-31 NOTE — Addendum Note (Signed)
Addended by: Bernestine Amass on: 01/31/2023 03:36 PM   Modules accepted: Orders

## 2023-01-31 NOTE — Progress Notes (Signed)
Cardiology Office Note:    Date:  01/31/2023   ID:  Ruben Masse., DOB 03-11-1956, MRN [id number]  PCP:  Horald Pollen, [place] Providers Cardiologist:  Mertie Moores, MD     Referring MD: Teressa Lower, MD   Chief Complaint  Patient presents with   Loss of Consciousness    History of Present Illness:    Ruben Decarli. is a 67 y.o. male with a hx of  ESRD, HTN and a signle episode of pre- syncope  Was at [employer] [facility],  and had a very brief episode of syncope Does not think he passed out completely  About a month ago  Had dizziness before the episode.    Was doing in home periotoneal  dialysis and he thinks he pulled too much volume that week.   Was scheduled to see cardiology .   He now has a fistula and does HD at a center  Feels better now .   No recent echo In cardiogram from February, 2022 reveals normal left ventricular systolic function with ejection fraction 65 to 70%.  He has grade 1 diastolic dysfunction.  Mild mitral regurgitation  No CP , no subsequent episodes of syncope      Past Medical History:  Diagnosis Date   Chronic kidney disease    Tu/Th/Sa   Diabetes mellitus without complication    Type II- does not take medicines or check levels at home.   Gout    1-2 exacerbations per year.  Takes Advil PRN for pain.   Hyperlipidemia    Hypertension    Migraine    Stroke 07/01/2012   L sided weakness, slurred speech.  Admission [facility].     Vitamin D deficiency 08/09/2019    Past Surgical History:  Procedure Laterality Date   Admission  07/01/2012   CVA (L sided weakness, slurred speech).  [facility].   AV FISTULA PLACEMENT Left 12/21/2020   Procedure: INSERTION OF ARTERIOVENOUS (AV) GORE-TEX GRAFT ARM;  Surgeon: Angelia Mould, MD;  Location: [place];  Service: Vascular;  Laterality: Left;   CAPD INSERTION N/A 09/29/2022   Procedure: LAPAROSCOPIC INSERTION CONTINUOUS AMBULATORY PERITONEAL DIALYSIS   (CAPD) CATHETER;  Surgeon: Serafina Mitchell, MD;  Location: [place];  Service: Vascular;  Laterality: N/A;   CAPD REMOVAL N/A 12/23/2022   Procedure: REMOVAL CONTINUOUS AMBULATORY PERITONEAL DIALYSIS  (CAPD) CATHETER;  Surgeon: Serafina Mitchell, MD;  Location: [place];  Service: Vascular;  Laterality: N/A;   COLONOSCOPY  10/31/2008   no polyps.  Repeat in 10 years.  [place].   HERNIA REPAIR     INSERTION OF DIALYSIS CATHETER Right 12/21/2020   Procedure: INSERTION OF TUNNELED  DIALYSIS CATHETER RIGHT INTERNAL JUGULAR;  Surgeon: Angelia Mould, MD;  Location[facility] OR;  Service: Vascular;  Laterality: Right;    Current Medications: Current Meds  Medication Sig   calcitRIOL (ROCALTROL) 0.5 MCG capsule Take 0.5 mcg by mouth daily.   ferric citrate (AURYXIA) 1 GM 210 MG(Fe) tablet Take 420 mg by mouth 3 (three) times daily with meals.   labetalol (NORMODYNE) 200 MG tablet Take 200 mg by mouth 2 (two) times daily.   losartan (COZAAR) 50 MG tablet Take 50 mg by mouth daily.     Allergies:   Patient has no known allergies.   Social History   Socioeconomic History   Marital status: Married    Spouse name: Not on file   Number of children: 2  Years of education: Masters   Highest education level: Not on file  Occupational History   Occupation: PRINCIPAL    Employer: [employer]  Tobacco Use   Smoking status: Never    Passive exposure: Past   Smokeless tobacco: Never  Vaping Use   Vaping Use: Never used  Substance and Sexual Activity   Alcohol use: No    Alcohol/week: 0.0 standard drinks of alcohol    Comment: former drinker   Drug use: No   Sexual activity: Yes    Partners: Female  Other Topics Concern   Not on file  Social History Narrative   Marital status: married      CHildren;  2 children; no grandchildren.      Lives at home alone.      Employment:  Pharmacist, hospital; principal in past.      Tobacco: none      Alcohol: weekends      Exercise:  Walking; weightlifting;  basketball.   Right handed.   2 cups caffeine/day.   Social Determinants of Health   Financial Resource Strain: Medium Risk (12/15/2020)   Overall Financial Resource Strain (CARDIA)    Difficulty of Paying Living Expenses: Somewhat hard  Food Insecurity: No Food Insecurity (09/29/2022)   Hunger Vital Sign    Worried About Running Out of Food in the Last Year: Never true    Ran Out of Food in the Last Year: Never true  Transportation Needs: No Transportation Needs (09/29/2022)   PRAPARE - Hydrologist (Medical): No    Lack of Transportation (Non-Medical): No  Physical Activity: Inactive (12/15/2020)   Exercise Vital Sign    Days of Exercise per Week: 0 days    Minutes of Exercise per Session: 0 min  Stress: Not on file  Social Connections: Not on file     Family History: The patient's family history includes Cancer in his maternal grandmother; Healthy in his father; Hypertension in his daughter and mother; Stroke in his mother.  ROS:   Please see the history of present illness.     All other systems reviewed and are negative.  EKGs/Labs/Other Studies Reviewed:    The following studies were reviewed today:   EKG:  !/13/24.   NSR , normal PR  Recent Labs: 11/12/2022: ALT 18; Platelets 172; TSH 1.354 12/23/2022: BUN 29; Creatinine, Ser 4.60; Hemoglobin 13.3; Potassium 3.6; Sodium 139  Recent Lipid Panel    Component Value Date/Time   CHOL 148 12/16/2020 0138   CHOL 214 (H) 09/04/2018 1211   TRIG 114 12/16/2020 0138   HDL 51 12/16/2020 0138   HDL 55 09/04/2018 1211   CHOLHDL 2.9 12/16/2020 0138   VLDL 23 12/16/2020 0138   LDLCALC 74 12/16/2020 0138   LDLCALC 116 (H) 09/04/2018 1211     Risk Assessment/Calculations:                Physical Exam:    VS:  BP 118/60   Pulse 83   Ht 5\' 9"  (1.753 m)   Wt 156 lb 12.8 oz (71.1 kg)   SpO2 99%   BMI 23.16 kg/m     Wt Readings from Last 3 Encounters:  01/31/23 156 lb 12.8 oz (71.1 kg)   12/23/22 170 lb (77.1 kg)  11/12/22 165 lb (74.8 kg)     GEN:  Well nourished, well developed in no acute distress HEENT: Normal NECK: No JVD; No carotid bruits LYMPHATICS: No lymphadenopathy CARDIAC: RRR, no murmurs,  rubs, gallops RESPIRATORY:  Clear to auscultation without rales, wheezing or rhonchi  ABDOMEN: Soft, non-tender, non-distended MUSCULOSKELETAL:  No edema; No deformity  SKIN: Warm and dry NEUROLOGIC:  Alert and oriented x 3 PSYCHIATRIC:  Normal affect   ASSESSMENT:    No diagnosis found. PLAN:    In order of problems listed above:  Presyncope: Patient presents for evaluation of an episode of presyncope.  He was doing home dialysis at the time and thinks that he was volume depleted.  He admits to pulling more fluid than he should have.  The episode clinically is consistent with volume depletion.  He does not have any significant cardiac murmurs.  We will be getting an echocardiogram to further evaluate his cardiac status.  I do not think that he needs an event monitor at this time.  Will get an echocardiogram.  I have asked him to call us if he has any recurrent episodes of dizziness, presyncope or syncope.  Will see him on an as-needed basis.           Medication Adjustments/Labs and Tests Ordered: Current medicines are reviewed at length with the patient today.  Concerns regarding medicines are outlined above.  No orders of the defined types were placed in this encounter.  No orders of the defined types were placed in this encounter.   There are no Patient Instructions on file for this visit.   Signed, Mertie Moores, MD  01/31/2023 3:22 PM    [place]

## 2023-02-02 ENCOUNTER — Telehealth: Payer: Self-pay | Admitting: Cardiovascular Disease

## 2023-02-02 DIAGNOSIS — Z992 Dependence on renal dialysis: Secondary | ICD-10-CM | POA: Diagnosis not present

## 2023-02-02 DIAGNOSIS — D631 Anemia in chronic kidney disease: Secondary | ICD-10-CM | POA: Diagnosis not present

## 2023-02-02 DIAGNOSIS — N186 End stage renal disease: Secondary | ICD-10-CM | POA: Diagnosis not present

## 2023-02-02 DIAGNOSIS — D689 Coagulation defect, unspecified: Secondary | ICD-10-CM | POA: Diagnosis not present

## 2023-02-02 DIAGNOSIS — D509 Iron deficiency anemia, unspecified: Secondary | ICD-10-CM | POA: Diagnosis not present

## 2023-02-02 DIAGNOSIS — E119 Type 2 diabetes mellitus without complications: Secondary | ICD-10-CM | POA: Diagnosis not present

## 2023-02-02 DIAGNOSIS — R52 Pain, unspecified: Secondary | ICD-10-CM | POA: Diagnosis not present

## 2023-02-02 DIAGNOSIS — N2581 Secondary hyperparathyroidism of renal origin: Secondary | ICD-10-CM | POA: Diagnosis not present

## 2023-02-02 NOTE — Telephone Encounter (Signed)
Patient is requesting a call back to discuss echo order.

## 2023-02-02 NOTE — Telephone Encounter (Signed)
Patient stated he had an OV on 4/2 and was confused  on why he needs an EKG. Explained to the patient and EKG was not ordered however, echocardiogram was ordered. Patient voiced understanding.

## 2023-02-04 DIAGNOSIS — D509 Iron deficiency anemia, unspecified: Secondary | ICD-10-CM | POA: Diagnosis not present

## 2023-02-04 DIAGNOSIS — E119 Type 2 diabetes mellitus without complications: Secondary | ICD-10-CM | POA: Diagnosis not present

## 2023-02-04 DIAGNOSIS — N186 End stage renal disease: Secondary | ICD-10-CM | POA: Diagnosis not present

## 2023-02-04 DIAGNOSIS — Z992 Dependence on renal dialysis: Secondary | ICD-10-CM | POA: Diagnosis not present

## 2023-02-04 DIAGNOSIS — D689 Coagulation defect, unspecified: Secondary | ICD-10-CM | POA: Diagnosis not present

## 2023-02-04 DIAGNOSIS — D631 Anemia in chronic kidney disease: Secondary | ICD-10-CM | POA: Diagnosis not present

## 2023-02-04 DIAGNOSIS — N2581 Secondary hyperparathyroidism of renal origin: Secondary | ICD-10-CM | POA: Diagnosis not present

## 2023-02-04 DIAGNOSIS — R52 Pain, unspecified: Secondary | ICD-10-CM | POA: Diagnosis not present

## 2023-02-07 DIAGNOSIS — D509 Iron deficiency anemia, unspecified: Secondary | ICD-10-CM | POA: Diagnosis not present

## 2023-02-07 DIAGNOSIS — N2581 Secondary hyperparathyroidism of renal origin: Secondary | ICD-10-CM | POA: Diagnosis not present

## 2023-02-07 DIAGNOSIS — R52 Pain, unspecified: Secondary | ICD-10-CM | POA: Diagnosis not present

## 2023-02-07 DIAGNOSIS — D631 Anemia in chronic kidney disease: Secondary | ICD-10-CM | POA: Diagnosis not present

## 2023-02-07 DIAGNOSIS — N186 End stage renal disease: Secondary | ICD-10-CM | POA: Diagnosis not present

## 2023-02-07 DIAGNOSIS — E119 Type 2 diabetes mellitus without complications: Secondary | ICD-10-CM | POA: Diagnosis not present

## 2023-02-07 DIAGNOSIS — Z992 Dependence on renal dialysis: Secondary | ICD-10-CM | POA: Diagnosis not present

## 2023-02-07 DIAGNOSIS — D689 Coagulation defect, unspecified: Secondary | ICD-10-CM | POA: Diagnosis not present

## 2023-02-09 DIAGNOSIS — Z992 Dependence on renal dialysis: Secondary | ICD-10-CM | POA: Diagnosis not present

## 2023-02-09 DIAGNOSIS — D689 Coagulation defect, unspecified: Secondary | ICD-10-CM | POA: Diagnosis not present

## 2023-02-09 DIAGNOSIS — E119 Type 2 diabetes mellitus without complications: Secondary | ICD-10-CM | POA: Diagnosis not present

## 2023-02-09 DIAGNOSIS — D631 Anemia in chronic kidney disease: Secondary | ICD-10-CM | POA: Diagnosis not present

## 2023-02-09 DIAGNOSIS — N186 End stage renal disease: Secondary | ICD-10-CM | POA: Diagnosis not present

## 2023-02-09 DIAGNOSIS — R52 Pain, unspecified: Secondary | ICD-10-CM | POA: Diagnosis not present

## 2023-02-09 DIAGNOSIS — D509 Iron deficiency anemia, unspecified: Secondary | ICD-10-CM | POA: Diagnosis not present

## 2023-02-09 DIAGNOSIS — N2581 Secondary hyperparathyroidism of renal origin: Secondary | ICD-10-CM | POA: Diagnosis not present

## 2023-02-14 DIAGNOSIS — Z992 Dependence on renal dialysis: Secondary | ICD-10-CM | POA: Diagnosis not present

## 2023-02-14 DIAGNOSIS — D631 Anemia in chronic kidney disease: Secondary | ICD-10-CM | POA: Diagnosis not present

## 2023-02-14 DIAGNOSIS — N2581 Secondary hyperparathyroidism of renal origin: Secondary | ICD-10-CM | POA: Diagnosis not present

## 2023-02-14 DIAGNOSIS — D509 Iron deficiency anemia, unspecified: Secondary | ICD-10-CM | POA: Diagnosis not present

## 2023-02-14 DIAGNOSIS — R52 Pain, unspecified: Secondary | ICD-10-CM | POA: Diagnosis not present

## 2023-02-14 DIAGNOSIS — D689 Coagulation defect, unspecified: Secondary | ICD-10-CM | POA: Diagnosis not present

## 2023-02-14 DIAGNOSIS — N186 End stage renal disease: Secondary | ICD-10-CM | POA: Diagnosis not present

## 2023-02-14 DIAGNOSIS — E119 Type 2 diabetes mellitus without complications: Secondary | ICD-10-CM | POA: Diagnosis not present

## 2023-02-16 DIAGNOSIS — Z992 Dependence on renal dialysis: Secondary | ICD-10-CM | POA: Diagnosis not present

## 2023-02-16 DIAGNOSIS — E119 Type 2 diabetes mellitus without complications: Secondary | ICD-10-CM | POA: Diagnosis not present

## 2023-02-16 DIAGNOSIS — D509 Iron deficiency anemia, unspecified: Secondary | ICD-10-CM | POA: Diagnosis not present

## 2023-02-16 DIAGNOSIS — D631 Anemia in chronic kidney disease: Secondary | ICD-10-CM | POA: Diagnosis not present

## 2023-02-16 DIAGNOSIS — R52 Pain, unspecified: Secondary | ICD-10-CM | POA: Diagnosis not present

## 2023-02-16 DIAGNOSIS — D689 Coagulation defect, unspecified: Secondary | ICD-10-CM | POA: Diagnosis not present

## 2023-02-16 DIAGNOSIS — N186 End stage renal disease: Secondary | ICD-10-CM | POA: Diagnosis not present

## 2023-02-16 DIAGNOSIS — N2581 Secondary hyperparathyroidism of renal origin: Secondary | ICD-10-CM | POA: Diagnosis not present

## 2023-02-18 DIAGNOSIS — Z992 Dependence on renal dialysis: Secondary | ICD-10-CM | POA: Diagnosis not present

## 2023-02-18 DIAGNOSIS — N186 End stage renal disease: Secondary | ICD-10-CM | POA: Diagnosis not present

## 2023-02-18 DIAGNOSIS — E119 Type 2 diabetes mellitus without complications: Secondary | ICD-10-CM | POA: Diagnosis not present

## 2023-02-18 DIAGNOSIS — D631 Anemia in chronic kidney disease: Secondary | ICD-10-CM | POA: Diagnosis not present

## 2023-02-18 DIAGNOSIS — N2581 Secondary hyperparathyroidism of renal origin: Secondary | ICD-10-CM | POA: Diagnosis not present

## 2023-02-18 DIAGNOSIS — D689 Coagulation defect, unspecified: Secondary | ICD-10-CM | POA: Diagnosis not present

## 2023-02-18 DIAGNOSIS — R52 Pain, unspecified: Secondary | ICD-10-CM | POA: Diagnosis not present

## 2023-02-18 DIAGNOSIS — D509 Iron deficiency anemia, unspecified: Secondary | ICD-10-CM | POA: Diagnosis not present

## 2023-02-21 DIAGNOSIS — D509 Iron deficiency anemia, unspecified: Secondary | ICD-10-CM | POA: Diagnosis not present

## 2023-02-21 DIAGNOSIS — E119 Type 2 diabetes mellitus without complications: Secondary | ICD-10-CM | POA: Diagnosis not present

## 2023-02-21 DIAGNOSIS — D689 Coagulation defect, unspecified: Secondary | ICD-10-CM | POA: Diagnosis not present

## 2023-02-21 DIAGNOSIS — N186 End stage renal disease: Secondary | ICD-10-CM | POA: Diagnosis not present

## 2023-02-21 DIAGNOSIS — N2581 Secondary hyperparathyroidism of renal origin: Secondary | ICD-10-CM | POA: Diagnosis not present

## 2023-02-21 DIAGNOSIS — Z992 Dependence on renal dialysis: Secondary | ICD-10-CM | POA: Diagnosis not present

## 2023-02-21 DIAGNOSIS — R52 Pain, unspecified: Secondary | ICD-10-CM | POA: Diagnosis not present

## 2023-02-21 DIAGNOSIS — D631 Anemia in chronic kidney disease: Secondary | ICD-10-CM | POA: Diagnosis not present

## 2023-02-23 DIAGNOSIS — D509 Iron deficiency anemia, unspecified: Secondary | ICD-10-CM | POA: Diagnosis not present

## 2023-02-23 DIAGNOSIS — D631 Anemia in chronic kidney disease: Secondary | ICD-10-CM | POA: Diagnosis not present

## 2023-02-23 DIAGNOSIS — D689 Coagulation defect, unspecified: Secondary | ICD-10-CM | POA: Diagnosis not present

## 2023-02-23 DIAGNOSIS — E119 Type 2 diabetes mellitus without complications: Secondary | ICD-10-CM | POA: Diagnosis not present

## 2023-02-23 DIAGNOSIS — N2581 Secondary hyperparathyroidism of renal origin: Secondary | ICD-10-CM | POA: Diagnosis not present

## 2023-02-23 DIAGNOSIS — N186 End stage renal disease: Secondary | ICD-10-CM | POA: Diagnosis not present

## 2023-02-23 DIAGNOSIS — Z992 Dependence on renal dialysis: Secondary | ICD-10-CM | POA: Diagnosis not present

## 2023-02-23 DIAGNOSIS — R52 Pain, unspecified: Secondary | ICD-10-CM | POA: Diagnosis not present

## 2023-02-25 DIAGNOSIS — R52 Pain, unspecified: Secondary | ICD-10-CM | POA: Diagnosis not present

## 2023-02-25 DIAGNOSIS — Z992 Dependence on renal dialysis: Secondary | ICD-10-CM | POA: Diagnosis not present

## 2023-02-25 DIAGNOSIS — D689 Coagulation defect, unspecified: Secondary | ICD-10-CM | POA: Diagnosis not present

## 2023-02-25 DIAGNOSIS — E119 Type 2 diabetes mellitus without complications: Secondary | ICD-10-CM | POA: Diagnosis not present

## 2023-02-25 DIAGNOSIS — D631 Anemia in chronic kidney disease: Secondary | ICD-10-CM | POA: Diagnosis not present

## 2023-02-25 DIAGNOSIS — D509 Iron deficiency anemia, unspecified: Secondary | ICD-10-CM | POA: Diagnosis not present

## 2023-02-25 DIAGNOSIS — N2581 Secondary hyperparathyroidism of renal origin: Secondary | ICD-10-CM | POA: Diagnosis not present

## 2023-02-25 DIAGNOSIS — N186 End stage renal disease: Secondary | ICD-10-CM | POA: Diagnosis not present

## 2023-02-27 ENCOUNTER — Ambulatory Visit (HOSPITAL_COMMUNITY): Payer: Medicare HMO | Attending: Cardiovascular Disease

## 2023-02-27 DIAGNOSIS — R55 Syncope and collapse: Secondary | ICD-10-CM | POA: Insufficient documentation

## 2023-02-27 DIAGNOSIS — I517 Cardiomegaly: Secondary | ICD-10-CM | POA: Diagnosis not present

## 2023-02-27 LAB — ECHOCARDIOGRAM COMPLETE
Area-P 1/2: 3.37 cm2
S' Lateral: 2.6 cm

## 2023-02-28 DIAGNOSIS — N186 End stage renal disease: Secondary | ICD-10-CM | POA: Diagnosis not present

## 2023-02-28 DIAGNOSIS — E119 Type 2 diabetes mellitus without complications: Secondary | ICD-10-CM | POA: Diagnosis not present

## 2023-02-28 DIAGNOSIS — E1122 Type 2 diabetes mellitus with diabetic chronic kidney disease: Secondary | ICD-10-CM | POA: Diagnosis not present

## 2023-02-28 DIAGNOSIS — D631 Anemia in chronic kidney disease: Secondary | ICD-10-CM | POA: Diagnosis not present

## 2023-02-28 DIAGNOSIS — R52 Pain, unspecified: Secondary | ICD-10-CM | POA: Diagnosis not present

## 2023-02-28 DIAGNOSIS — N2581 Secondary hyperparathyroidism of renal origin: Secondary | ICD-10-CM | POA: Diagnosis not present

## 2023-02-28 DIAGNOSIS — D509 Iron deficiency anemia, unspecified: Secondary | ICD-10-CM | POA: Diagnosis not present

## 2023-02-28 DIAGNOSIS — Z992 Dependence on renal dialysis: Secondary | ICD-10-CM | POA: Diagnosis not present

## 2023-02-28 DIAGNOSIS — D689 Coagulation defect, unspecified: Secondary | ICD-10-CM | POA: Diagnosis not present

## 2023-03-02 DIAGNOSIS — R52 Pain, unspecified: Secondary | ICD-10-CM | POA: Diagnosis not present

## 2023-03-02 DIAGNOSIS — Z992 Dependence on renal dialysis: Secondary | ICD-10-CM | POA: Diagnosis not present

## 2023-03-02 DIAGNOSIS — N186 End stage renal disease: Secondary | ICD-10-CM | POA: Diagnosis not present

## 2023-03-02 DIAGNOSIS — N2581 Secondary hyperparathyroidism of renal origin: Secondary | ICD-10-CM | POA: Diagnosis not present

## 2023-03-02 DIAGNOSIS — D689 Coagulation defect, unspecified: Secondary | ICD-10-CM | POA: Diagnosis not present

## 2023-03-02 DIAGNOSIS — D631 Anemia in chronic kidney disease: Secondary | ICD-10-CM | POA: Diagnosis not present

## 2023-03-04 DIAGNOSIS — R52 Pain, unspecified: Secondary | ICD-10-CM | POA: Diagnosis not present

## 2023-03-04 DIAGNOSIS — D689 Coagulation defect, unspecified: Secondary | ICD-10-CM | POA: Diagnosis not present

## 2023-03-04 DIAGNOSIS — D631 Anemia in chronic kidney disease: Secondary | ICD-10-CM | POA: Diagnosis not present

## 2023-03-04 DIAGNOSIS — Z992 Dependence on renal dialysis: Secondary | ICD-10-CM | POA: Diagnosis not present

## 2023-03-04 DIAGNOSIS — N2581 Secondary hyperparathyroidism of renal origin: Secondary | ICD-10-CM | POA: Diagnosis not present

## 2023-03-04 DIAGNOSIS — N186 End stage renal disease: Secondary | ICD-10-CM | POA: Diagnosis not present

## 2023-03-07 DIAGNOSIS — D689 Coagulation defect, unspecified: Secondary | ICD-10-CM | POA: Diagnosis not present

## 2023-03-07 DIAGNOSIS — N186 End stage renal disease: Secondary | ICD-10-CM | POA: Diagnosis not present

## 2023-03-07 DIAGNOSIS — N2581 Secondary hyperparathyroidism of renal origin: Secondary | ICD-10-CM | POA: Diagnosis not present

## 2023-03-07 DIAGNOSIS — Z992 Dependence on renal dialysis: Secondary | ICD-10-CM | POA: Diagnosis not present

## 2023-03-07 DIAGNOSIS — D631 Anemia in chronic kidney disease: Secondary | ICD-10-CM | POA: Diagnosis not present

## 2023-03-07 DIAGNOSIS — R52 Pain, unspecified: Secondary | ICD-10-CM | POA: Diagnosis not present

## 2023-03-11 DIAGNOSIS — Z992 Dependence on renal dialysis: Secondary | ICD-10-CM | POA: Diagnosis not present

## 2023-03-11 DIAGNOSIS — N186 End stage renal disease: Secondary | ICD-10-CM | POA: Diagnosis not present

## 2023-03-11 DIAGNOSIS — D689 Coagulation defect, unspecified: Secondary | ICD-10-CM | POA: Diagnosis not present

## 2023-03-11 DIAGNOSIS — D631 Anemia in chronic kidney disease: Secondary | ICD-10-CM | POA: Diagnosis not present

## 2023-03-11 DIAGNOSIS — R52 Pain, unspecified: Secondary | ICD-10-CM | POA: Diagnosis not present

## 2023-03-11 DIAGNOSIS — N2581 Secondary hyperparathyroidism of renal origin: Secondary | ICD-10-CM | POA: Diagnosis not present

## 2023-03-14 DIAGNOSIS — R52 Pain, unspecified: Secondary | ICD-10-CM | POA: Diagnosis not present

## 2023-03-14 DIAGNOSIS — N2581 Secondary hyperparathyroidism of renal origin: Secondary | ICD-10-CM | POA: Diagnosis not present

## 2023-03-14 DIAGNOSIS — D631 Anemia in chronic kidney disease: Secondary | ICD-10-CM | POA: Diagnosis not present

## 2023-03-14 DIAGNOSIS — Z992 Dependence on renal dialysis: Secondary | ICD-10-CM | POA: Diagnosis not present

## 2023-03-14 DIAGNOSIS — D689 Coagulation defect, unspecified: Secondary | ICD-10-CM | POA: Diagnosis not present

## 2023-03-14 DIAGNOSIS — N186 End stage renal disease: Secondary | ICD-10-CM | POA: Diagnosis not present

## 2023-03-16 DIAGNOSIS — Z992 Dependence on renal dialysis: Secondary | ICD-10-CM | POA: Diagnosis not present

## 2023-03-16 DIAGNOSIS — D631 Anemia in chronic kidney disease: Secondary | ICD-10-CM | POA: Diagnosis not present

## 2023-03-16 DIAGNOSIS — N186 End stage renal disease: Secondary | ICD-10-CM | POA: Diagnosis not present

## 2023-03-16 DIAGNOSIS — R52 Pain, unspecified: Secondary | ICD-10-CM | POA: Diagnosis not present

## 2023-03-16 DIAGNOSIS — N2581 Secondary hyperparathyroidism of renal origin: Secondary | ICD-10-CM | POA: Diagnosis not present

## 2023-03-16 DIAGNOSIS — D689 Coagulation defect, unspecified: Secondary | ICD-10-CM | POA: Diagnosis not present

## 2023-03-18 DIAGNOSIS — R52 Pain, unspecified: Secondary | ICD-10-CM | POA: Diagnosis not present

## 2023-03-18 DIAGNOSIS — N186 End stage renal disease: Secondary | ICD-10-CM | POA: Diagnosis not present

## 2023-03-18 DIAGNOSIS — D631 Anemia in chronic kidney disease: Secondary | ICD-10-CM | POA: Diagnosis not present

## 2023-03-18 DIAGNOSIS — Z992 Dependence on renal dialysis: Secondary | ICD-10-CM | POA: Diagnosis not present

## 2023-03-18 DIAGNOSIS — D689 Coagulation defect, unspecified: Secondary | ICD-10-CM | POA: Diagnosis not present

## 2023-03-18 DIAGNOSIS — N2581 Secondary hyperparathyroidism of renal origin: Secondary | ICD-10-CM | POA: Diagnosis not present

## 2023-03-25 DIAGNOSIS — N186 End stage renal disease: Secondary | ICD-10-CM | POA: Diagnosis not present

## 2023-03-25 DIAGNOSIS — R52 Pain, unspecified: Secondary | ICD-10-CM | POA: Diagnosis not present

## 2023-03-25 DIAGNOSIS — D689 Coagulation defect, unspecified: Secondary | ICD-10-CM | POA: Diagnosis not present

## 2023-03-25 DIAGNOSIS — D631 Anemia in chronic kidney disease: Secondary | ICD-10-CM | POA: Diagnosis not present

## 2023-03-25 DIAGNOSIS — Z992 Dependence on renal dialysis: Secondary | ICD-10-CM | POA: Diagnosis not present

## 2023-03-25 DIAGNOSIS — N2581 Secondary hyperparathyroidism of renal origin: Secondary | ICD-10-CM | POA: Diagnosis not present

## 2023-03-28 DIAGNOSIS — N186 End stage renal disease: Secondary | ICD-10-CM | POA: Diagnosis not present

## 2023-03-28 DIAGNOSIS — N2581 Secondary hyperparathyroidism of renal origin: Secondary | ICD-10-CM | POA: Diagnosis not present

## 2023-03-28 DIAGNOSIS — D689 Coagulation defect, unspecified: Secondary | ICD-10-CM | POA: Diagnosis not present

## 2023-03-28 DIAGNOSIS — D631 Anemia in chronic kidney disease: Secondary | ICD-10-CM | POA: Diagnosis not present

## 2023-03-28 DIAGNOSIS — Z992 Dependence on renal dialysis: Secondary | ICD-10-CM | POA: Diagnosis not present

## 2023-03-28 DIAGNOSIS — R52 Pain, unspecified: Secondary | ICD-10-CM | POA: Diagnosis not present

## 2023-03-30 DIAGNOSIS — R52 Pain, unspecified: Secondary | ICD-10-CM | POA: Diagnosis not present

## 2023-03-30 DIAGNOSIS — D689 Coagulation defect, unspecified: Secondary | ICD-10-CM | POA: Diagnosis not present

## 2023-03-30 DIAGNOSIS — Z992 Dependence on renal dialysis: Secondary | ICD-10-CM | POA: Diagnosis not present

## 2023-03-30 DIAGNOSIS — N2581 Secondary hyperparathyroidism of renal origin: Secondary | ICD-10-CM | POA: Diagnosis not present

## 2023-03-30 DIAGNOSIS — D631 Anemia in chronic kidney disease: Secondary | ICD-10-CM | POA: Diagnosis not present

## 2023-03-30 DIAGNOSIS — N186 End stage renal disease: Secondary | ICD-10-CM | POA: Diagnosis not present

## 2023-03-31 DIAGNOSIS — E1122 Type 2 diabetes mellitus with diabetic chronic kidney disease: Secondary | ICD-10-CM | POA: Diagnosis not present

## 2023-03-31 DIAGNOSIS — N186 End stage renal disease: Secondary | ICD-10-CM | POA: Diagnosis not present

## 2023-03-31 DIAGNOSIS — Z992 Dependence on renal dialysis: Secondary | ICD-10-CM | POA: Diagnosis not present

## 2023-04-01 DIAGNOSIS — Z992 Dependence on renal dialysis: Secondary | ICD-10-CM | POA: Diagnosis not present

## 2023-04-01 DIAGNOSIS — N186 End stage renal disease: Secondary | ICD-10-CM | POA: Diagnosis not present

## 2023-04-01 DIAGNOSIS — D689 Coagulation defect, unspecified: Secondary | ICD-10-CM | POA: Diagnosis not present

## 2023-04-01 DIAGNOSIS — R197 Diarrhea, unspecified: Secondary | ICD-10-CM | POA: Diagnosis not present

## 2023-04-01 DIAGNOSIS — D631 Anemia in chronic kidney disease: Secondary | ICD-10-CM | POA: Diagnosis not present

## 2023-04-01 DIAGNOSIS — N2581 Secondary hyperparathyroidism of renal origin: Secondary | ICD-10-CM | POA: Diagnosis not present

## 2023-04-04 DIAGNOSIS — N2581 Secondary hyperparathyroidism of renal origin: Secondary | ICD-10-CM | POA: Diagnosis not present

## 2023-04-04 DIAGNOSIS — D631 Anemia in chronic kidney disease: Secondary | ICD-10-CM | POA: Diagnosis not present

## 2023-04-04 DIAGNOSIS — N186 End stage renal disease: Secondary | ICD-10-CM | POA: Diagnosis not present

## 2023-04-04 DIAGNOSIS — R197 Diarrhea, unspecified: Secondary | ICD-10-CM | POA: Diagnosis not present

## 2023-04-04 DIAGNOSIS — D689 Coagulation defect, unspecified: Secondary | ICD-10-CM | POA: Diagnosis not present

## 2023-04-04 DIAGNOSIS — Z992 Dependence on renal dialysis: Secondary | ICD-10-CM | POA: Diagnosis not present

## 2023-04-06 DIAGNOSIS — R197 Diarrhea, unspecified: Secondary | ICD-10-CM | POA: Diagnosis not present

## 2023-04-06 DIAGNOSIS — N2581 Secondary hyperparathyroidism of renal origin: Secondary | ICD-10-CM | POA: Diagnosis not present

## 2023-04-06 DIAGNOSIS — D689 Coagulation defect, unspecified: Secondary | ICD-10-CM | POA: Diagnosis not present

## 2023-04-06 DIAGNOSIS — N186 End stage renal disease: Secondary | ICD-10-CM | POA: Diagnosis not present

## 2023-04-06 DIAGNOSIS — D631 Anemia in chronic kidney disease: Secondary | ICD-10-CM | POA: Diagnosis not present

## 2023-04-06 DIAGNOSIS — Z992 Dependence on renal dialysis: Secondary | ICD-10-CM | POA: Diagnosis not present

## 2023-04-15 DIAGNOSIS — R197 Diarrhea, unspecified: Secondary | ICD-10-CM | POA: Diagnosis not present

## 2023-04-15 DIAGNOSIS — D631 Anemia in chronic kidney disease: Secondary | ICD-10-CM | POA: Diagnosis not present

## 2023-04-15 DIAGNOSIS — Z992 Dependence on renal dialysis: Secondary | ICD-10-CM | POA: Diagnosis not present

## 2023-04-15 DIAGNOSIS — N186 End stage renal disease: Secondary | ICD-10-CM | POA: Diagnosis not present

## 2023-04-15 DIAGNOSIS — N2581 Secondary hyperparathyroidism of renal origin: Secondary | ICD-10-CM | POA: Diagnosis not present

## 2023-04-15 DIAGNOSIS — D689 Coagulation defect, unspecified: Secondary | ICD-10-CM | POA: Diagnosis not present

## 2023-04-18 DIAGNOSIS — D689 Coagulation defect, unspecified: Secondary | ICD-10-CM | POA: Diagnosis not present

## 2023-04-18 DIAGNOSIS — R197 Diarrhea, unspecified: Secondary | ICD-10-CM | POA: Diagnosis not present

## 2023-04-18 DIAGNOSIS — N2581 Secondary hyperparathyroidism of renal origin: Secondary | ICD-10-CM | POA: Diagnosis not present

## 2023-04-18 DIAGNOSIS — N186 End stage renal disease: Secondary | ICD-10-CM | POA: Diagnosis not present

## 2023-04-18 DIAGNOSIS — Z992 Dependence on renal dialysis: Secondary | ICD-10-CM | POA: Diagnosis not present

## 2023-04-18 DIAGNOSIS — D631 Anemia in chronic kidney disease: Secondary | ICD-10-CM | POA: Diagnosis not present

## 2023-04-20 DIAGNOSIS — D689 Coagulation defect, unspecified: Secondary | ICD-10-CM | POA: Diagnosis not present

## 2023-04-20 DIAGNOSIS — N2581 Secondary hyperparathyroidism of renal origin: Secondary | ICD-10-CM | POA: Diagnosis not present

## 2023-04-20 DIAGNOSIS — N186 End stage renal disease: Secondary | ICD-10-CM | POA: Diagnosis not present

## 2023-04-20 DIAGNOSIS — R197 Diarrhea, unspecified: Secondary | ICD-10-CM | POA: Diagnosis not present

## 2023-04-20 DIAGNOSIS — Z992 Dependence on renal dialysis: Secondary | ICD-10-CM | POA: Diagnosis not present

## 2023-04-20 DIAGNOSIS — D631 Anemia in chronic kidney disease: Secondary | ICD-10-CM | POA: Diagnosis not present

## 2023-04-22 DIAGNOSIS — D631 Anemia in chronic kidney disease: Secondary | ICD-10-CM | POA: Diagnosis not present

## 2023-04-22 DIAGNOSIS — D689 Coagulation defect, unspecified: Secondary | ICD-10-CM | POA: Diagnosis not present

## 2023-04-22 DIAGNOSIS — R197 Diarrhea, unspecified: Secondary | ICD-10-CM | POA: Diagnosis not present

## 2023-04-22 DIAGNOSIS — Z992 Dependence on renal dialysis: Secondary | ICD-10-CM | POA: Diagnosis not present

## 2023-04-22 DIAGNOSIS — N186 End stage renal disease: Secondary | ICD-10-CM | POA: Diagnosis not present

## 2023-04-22 DIAGNOSIS — N2581 Secondary hyperparathyroidism of renal origin: Secondary | ICD-10-CM | POA: Diagnosis not present

## 2023-04-25 DIAGNOSIS — N2581 Secondary hyperparathyroidism of renal origin: Secondary | ICD-10-CM | POA: Diagnosis not present

## 2023-04-25 DIAGNOSIS — Z992 Dependence on renal dialysis: Secondary | ICD-10-CM | POA: Diagnosis not present

## 2023-04-25 DIAGNOSIS — D631 Anemia in chronic kidney disease: Secondary | ICD-10-CM | POA: Diagnosis not present

## 2023-04-25 DIAGNOSIS — N186 End stage renal disease: Secondary | ICD-10-CM | POA: Diagnosis not present

## 2023-04-25 DIAGNOSIS — D689 Coagulation defect, unspecified: Secondary | ICD-10-CM | POA: Diagnosis not present

## 2023-04-25 DIAGNOSIS — R197 Diarrhea, unspecified: Secondary | ICD-10-CM | POA: Diagnosis not present

## 2023-04-27 DIAGNOSIS — D631 Anemia in chronic kidney disease: Secondary | ICD-10-CM | POA: Diagnosis not present

## 2023-04-27 DIAGNOSIS — N186 End stage renal disease: Secondary | ICD-10-CM | POA: Diagnosis not present

## 2023-04-27 DIAGNOSIS — D689 Coagulation defect, unspecified: Secondary | ICD-10-CM | POA: Diagnosis not present

## 2023-04-27 DIAGNOSIS — N2581 Secondary hyperparathyroidism of renal origin: Secondary | ICD-10-CM | POA: Diagnosis not present

## 2023-04-27 DIAGNOSIS — Z992 Dependence on renal dialysis: Secondary | ICD-10-CM | POA: Diagnosis not present

## 2023-04-27 DIAGNOSIS — R197 Diarrhea, unspecified: Secondary | ICD-10-CM | POA: Diagnosis not present

## 2023-04-29 DIAGNOSIS — N186 End stage renal disease: Secondary | ICD-10-CM | POA: Diagnosis not present

## 2023-04-29 DIAGNOSIS — D631 Anemia in chronic kidney disease: Secondary | ICD-10-CM | POA: Diagnosis not present

## 2023-04-29 DIAGNOSIS — Z992 Dependence on renal dialysis: Secondary | ICD-10-CM | POA: Diagnosis not present

## 2023-04-29 DIAGNOSIS — D689 Coagulation defect, unspecified: Secondary | ICD-10-CM | POA: Diagnosis not present

## 2023-04-29 DIAGNOSIS — R197 Diarrhea, unspecified: Secondary | ICD-10-CM | POA: Diagnosis not present

## 2023-04-29 DIAGNOSIS — N2581 Secondary hyperparathyroidism of renal origin: Secondary | ICD-10-CM | POA: Diagnosis not present

## 2023-04-30 DIAGNOSIS — N186 End stage renal disease: Secondary | ICD-10-CM | POA: Diagnosis not present

## 2023-04-30 DIAGNOSIS — Z992 Dependence on renal dialysis: Secondary | ICD-10-CM | POA: Diagnosis not present

## 2023-04-30 DIAGNOSIS — E1122 Type 2 diabetes mellitus with diabetic chronic kidney disease: Secondary | ICD-10-CM | POA: Diagnosis not present

## 2023-05-02 DIAGNOSIS — Z992 Dependence on renal dialysis: Secondary | ICD-10-CM | POA: Diagnosis not present

## 2023-05-02 DIAGNOSIS — E119 Type 2 diabetes mellitus without complications: Secondary | ICD-10-CM | POA: Diagnosis not present

## 2023-05-02 DIAGNOSIS — D631 Anemia in chronic kidney disease: Secondary | ICD-10-CM | POA: Diagnosis not present

## 2023-05-02 DIAGNOSIS — N2581 Secondary hyperparathyroidism of renal origin: Secondary | ICD-10-CM | POA: Diagnosis not present

## 2023-05-02 DIAGNOSIS — D689 Coagulation defect, unspecified: Secondary | ICD-10-CM | POA: Diagnosis not present

## 2023-05-02 DIAGNOSIS — N186 End stage renal disease: Secondary | ICD-10-CM | POA: Diagnosis not present

## 2023-05-02 DIAGNOSIS — D509 Iron deficiency anemia, unspecified: Secondary | ICD-10-CM | POA: Diagnosis not present

## 2023-05-09 DIAGNOSIS — E119 Type 2 diabetes mellitus without complications: Secondary | ICD-10-CM | POA: Diagnosis not present

## 2023-05-09 DIAGNOSIS — D689 Coagulation defect, unspecified: Secondary | ICD-10-CM | POA: Diagnosis not present

## 2023-05-09 DIAGNOSIS — D509 Iron deficiency anemia, unspecified: Secondary | ICD-10-CM | POA: Diagnosis not present

## 2023-05-09 DIAGNOSIS — D631 Anemia in chronic kidney disease: Secondary | ICD-10-CM | POA: Diagnosis not present

## 2023-05-09 DIAGNOSIS — Z992 Dependence on renal dialysis: Secondary | ICD-10-CM | POA: Diagnosis not present

## 2023-05-09 DIAGNOSIS — N186 End stage renal disease: Secondary | ICD-10-CM | POA: Diagnosis not present

## 2023-05-09 DIAGNOSIS — N2581 Secondary hyperparathyroidism of renal origin: Secondary | ICD-10-CM | POA: Diagnosis not present

## 2023-05-11 DIAGNOSIS — N186 End stage renal disease: Secondary | ICD-10-CM | POA: Diagnosis not present

## 2023-05-11 DIAGNOSIS — Z992 Dependence on renal dialysis: Secondary | ICD-10-CM | POA: Diagnosis not present

## 2023-05-11 DIAGNOSIS — D509 Iron deficiency anemia, unspecified: Secondary | ICD-10-CM | POA: Diagnosis not present

## 2023-05-11 DIAGNOSIS — E119 Type 2 diabetes mellitus without complications: Secondary | ICD-10-CM | POA: Diagnosis not present

## 2023-05-11 DIAGNOSIS — D689 Coagulation defect, unspecified: Secondary | ICD-10-CM | POA: Diagnosis not present

## 2023-05-11 DIAGNOSIS — N2581 Secondary hyperparathyroidism of renal origin: Secondary | ICD-10-CM | POA: Diagnosis not present

## 2023-05-11 DIAGNOSIS — D631 Anemia in chronic kidney disease: Secondary | ICD-10-CM | POA: Diagnosis not present

## 2023-05-13 DIAGNOSIS — D689 Coagulation defect, unspecified: Secondary | ICD-10-CM | POA: Diagnosis not present

## 2023-05-13 DIAGNOSIS — N186 End stage renal disease: Secondary | ICD-10-CM | POA: Diagnosis not present

## 2023-05-13 DIAGNOSIS — Z992 Dependence on renal dialysis: Secondary | ICD-10-CM | POA: Diagnosis not present

## 2023-05-13 DIAGNOSIS — D631 Anemia in chronic kidney disease: Secondary | ICD-10-CM | POA: Diagnosis not present

## 2023-05-13 DIAGNOSIS — E119 Type 2 diabetes mellitus without complications: Secondary | ICD-10-CM | POA: Diagnosis not present

## 2023-05-13 DIAGNOSIS — N2581 Secondary hyperparathyroidism of renal origin: Secondary | ICD-10-CM | POA: Diagnosis not present

## 2023-05-13 DIAGNOSIS — D509 Iron deficiency anemia, unspecified: Secondary | ICD-10-CM | POA: Diagnosis not present

## 2023-05-16 DIAGNOSIS — N186 End stage renal disease: Secondary | ICD-10-CM | POA: Diagnosis not present

## 2023-05-16 DIAGNOSIS — D689 Coagulation defect, unspecified: Secondary | ICD-10-CM | POA: Diagnosis not present

## 2023-05-16 DIAGNOSIS — Z992 Dependence on renal dialysis: Secondary | ICD-10-CM | POA: Diagnosis not present

## 2023-05-16 DIAGNOSIS — D631 Anemia in chronic kidney disease: Secondary | ICD-10-CM | POA: Diagnosis not present

## 2023-05-16 DIAGNOSIS — E119 Type 2 diabetes mellitus without complications: Secondary | ICD-10-CM | POA: Diagnosis not present

## 2023-05-16 DIAGNOSIS — N2581 Secondary hyperparathyroidism of renal origin: Secondary | ICD-10-CM | POA: Diagnosis not present

## 2023-05-16 DIAGNOSIS — D509 Iron deficiency anemia, unspecified: Secondary | ICD-10-CM | POA: Diagnosis not present

## 2023-05-18 DIAGNOSIS — N2581 Secondary hyperparathyroidism of renal origin: Secondary | ICD-10-CM | POA: Diagnosis not present

## 2023-05-18 DIAGNOSIS — Z992 Dependence on renal dialysis: Secondary | ICD-10-CM | POA: Diagnosis not present

## 2023-05-18 DIAGNOSIS — D689 Coagulation defect, unspecified: Secondary | ICD-10-CM | POA: Diagnosis not present

## 2023-05-18 DIAGNOSIS — N186 End stage renal disease: Secondary | ICD-10-CM | POA: Diagnosis not present

## 2023-05-18 DIAGNOSIS — E119 Type 2 diabetes mellitus without complications: Secondary | ICD-10-CM | POA: Diagnosis not present

## 2023-05-18 DIAGNOSIS — D631 Anemia in chronic kidney disease: Secondary | ICD-10-CM | POA: Diagnosis not present

## 2023-05-18 DIAGNOSIS — D509 Iron deficiency anemia, unspecified: Secondary | ICD-10-CM | POA: Diagnosis not present

## 2023-05-20 DIAGNOSIS — Z992 Dependence on renal dialysis: Secondary | ICD-10-CM | POA: Diagnosis not present

## 2023-05-20 DIAGNOSIS — N2581 Secondary hyperparathyroidism of renal origin: Secondary | ICD-10-CM | POA: Diagnosis not present

## 2023-05-20 DIAGNOSIS — D631 Anemia in chronic kidney disease: Secondary | ICD-10-CM | POA: Diagnosis not present

## 2023-05-20 DIAGNOSIS — D689 Coagulation defect, unspecified: Secondary | ICD-10-CM | POA: Diagnosis not present

## 2023-05-20 DIAGNOSIS — N186 End stage renal disease: Secondary | ICD-10-CM | POA: Diagnosis not present

## 2023-05-20 DIAGNOSIS — D509 Iron deficiency anemia, unspecified: Secondary | ICD-10-CM | POA: Diagnosis not present

## 2023-05-20 DIAGNOSIS — E119 Type 2 diabetes mellitus without complications: Secondary | ICD-10-CM | POA: Diagnosis not present

## 2023-05-23 DIAGNOSIS — D631 Anemia in chronic kidney disease: Secondary | ICD-10-CM | POA: Diagnosis not present

## 2023-05-23 DIAGNOSIS — N186 End stage renal disease: Secondary | ICD-10-CM | POA: Diagnosis not present

## 2023-05-23 DIAGNOSIS — D689 Coagulation defect, unspecified: Secondary | ICD-10-CM | POA: Diagnosis not present

## 2023-05-23 DIAGNOSIS — Z992 Dependence on renal dialysis: Secondary | ICD-10-CM | POA: Diagnosis not present

## 2023-05-23 DIAGNOSIS — N2581 Secondary hyperparathyroidism of renal origin: Secondary | ICD-10-CM | POA: Diagnosis not present

## 2023-05-23 DIAGNOSIS — E119 Type 2 diabetes mellitus without complications: Secondary | ICD-10-CM | POA: Diagnosis not present

## 2023-05-23 DIAGNOSIS — D509 Iron deficiency anemia, unspecified: Secondary | ICD-10-CM | POA: Diagnosis not present

## 2023-05-25 DIAGNOSIS — D631 Anemia in chronic kidney disease: Secondary | ICD-10-CM | POA: Diagnosis not present

## 2023-05-25 DIAGNOSIS — E119 Type 2 diabetes mellitus without complications: Secondary | ICD-10-CM | POA: Diagnosis not present

## 2023-05-25 DIAGNOSIS — D509 Iron deficiency anemia, unspecified: Secondary | ICD-10-CM | POA: Diagnosis not present

## 2023-05-25 DIAGNOSIS — D689 Coagulation defect, unspecified: Secondary | ICD-10-CM | POA: Diagnosis not present

## 2023-05-25 DIAGNOSIS — Z992 Dependence on renal dialysis: Secondary | ICD-10-CM | POA: Diagnosis not present

## 2023-05-25 DIAGNOSIS — N186 End stage renal disease: Secondary | ICD-10-CM | POA: Diagnosis not present

## 2023-05-25 DIAGNOSIS — N2581 Secondary hyperparathyroidism of renal origin: Secondary | ICD-10-CM | POA: Diagnosis not present

## 2023-05-30 DIAGNOSIS — N2581 Secondary hyperparathyroidism of renal origin: Secondary | ICD-10-CM | POA: Diagnosis not present

## 2023-05-30 DIAGNOSIS — D631 Anemia in chronic kidney disease: Secondary | ICD-10-CM | POA: Diagnosis not present

## 2023-05-30 DIAGNOSIS — Z992 Dependence on renal dialysis: Secondary | ICD-10-CM | POA: Diagnosis not present

## 2023-05-30 DIAGNOSIS — D509 Iron deficiency anemia, unspecified: Secondary | ICD-10-CM | POA: Diagnosis not present

## 2023-05-30 DIAGNOSIS — E119 Type 2 diabetes mellitus without complications: Secondary | ICD-10-CM | POA: Diagnosis not present

## 2023-05-30 DIAGNOSIS — D689 Coagulation defect, unspecified: Secondary | ICD-10-CM | POA: Diagnosis not present

## 2023-05-30 DIAGNOSIS — N186 End stage renal disease: Secondary | ICD-10-CM | POA: Diagnosis not present

## 2023-05-31 DIAGNOSIS — N186 End stage renal disease: Secondary | ICD-10-CM | POA: Diagnosis not present

## 2023-05-31 DIAGNOSIS — Z992 Dependence on renal dialysis: Secondary | ICD-10-CM | POA: Diagnosis not present

## 2023-05-31 DIAGNOSIS — E1122 Type 2 diabetes mellitus with diabetic chronic kidney disease: Secondary | ICD-10-CM | POA: Diagnosis not present

## 2023-06-06 DIAGNOSIS — Z992 Dependence on renal dialysis: Secondary | ICD-10-CM | POA: Diagnosis not present

## 2023-06-06 DIAGNOSIS — D689 Coagulation defect, unspecified: Secondary | ICD-10-CM | POA: Diagnosis not present

## 2023-06-06 DIAGNOSIS — D509 Iron deficiency anemia, unspecified: Secondary | ICD-10-CM | POA: Diagnosis not present

## 2023-06-06 DIAGNOSIS — D631 Anemia in chronic kidney disease: Secondary | ICD-10-CM | POA: Diagnosis not present

## 2023-06-06 DIAGNOSIS — N186 End stage renal disease: Secondary | ICD-10-CM | POA: Diagnosis not present

## 2023-06-06 DIAGNOSIS — N2581 Secondary hyperparathyroidism of renal origin: Secondary | ICD-10-CM | POA: Diagnosis not present

## 2023-06-06 DIAGNOSIS — R52 Pain, unspecified: Secondary | ICD-10-CM | POA: Diagnosis not present

## 2023-06-08 DIAGNOSIS — D631 Anemia in chronic kidney disease: Secondary | ICD-10-CM | POA: Diagnosis not present

## 2023-06-08 DIAGNOSIS — D509 Iron deficiency anemia, unspecified: Secondary | ICD-10-CM | POA: Diagnosis not present

## 2023-06-08 DIAGNOSIS — N186 End stage renal disease: Secondary | ICD-10-CM | POA: Diagnosis not present

## 2023-06-08 DIAGNOSIS — Z992 Dependence on renal dialysis: Secondary | ICD-10-CM | POA: Diagnosis not present

## 2023-06-08 DIAGNOSIS — R52 Pain, unspecified: Secondary | ICD-10-CM | POA: Diagnosis not present

## 2023-06-08 DIAGNOSIS — D689 Coagulation defect, unspecified: Secondary | ICD-10-CM | POA: Diagnosis not present

## 2023-06-08 DIAGNOSIS — N2581 Secondary hyperparathyroidism of renal origin: Secondary | ICD-10-CM | POA: Diagnosis not present

## 2023-06-10 DIAGNOSIS — D631 Anemia in chronic kidney disease: Secondary | ICD-10-CM | POA: Diagnosis not present

## 2023-06-10 DIAGNOSIS — Z992 Dependence on renal dialysis: Secondary | ICD-10-CM | POA: Diagnosis not present

## 2023-06-10 DIAGNOSIS — N2581 Secondary hyperparathyroidism of renal origin: Secondary | ICD-10-CM | POA: Diagnosis not present

## 2023-06-10 DIAGNOSIS — R52 Pain, unspecified: Secondary | ICD-10-CM | POA: Diagnosis not present

## 2023-06-10 DIAGNOSIS — D689 Coagulation defect, unspecified: Secondary | ICD-10-CM | POA: Diagnosis not present

## 2023-06-10 DIAGNOSIS — D509 Iron deficiency anemia, unspecified: Secondary | ICD-10-CM | POA: Diagnosis not present

## 2023-06-10 DIAGNOSIS — N186 End stage renal disease: Secondary | ICD-10-CM | POA: Diagnosis not present

## 2023-06-13 DIAGNOSIS — D689 Coagulation defect, unspecified: Secondary | ICD-10-CM | POA: Diagnosis not present

## 2023-06-13 DIAGNOSIS — R52 Pain, unspecified: Secondary | ICD-10-CM | POA: Diagnosis not present

## 2023-06-13 DIAGNOSIS — D631 Anemia in chronic kidney disease: Secondary | ICD-10-CM | POA: Diagnosis not present

## 2023-06-13 DIAGNOSIS — N2581 Secondary hyperparathyroidism of renal origin: Secondary | ICD-10-CM | POA: Diagnosis not present

## 2023-06-13 DIAGNOSIS — D509 Iron deficiency anemia, unspecified: Secondary | ICD-10-CM | POA: Diagnosis not present

## 2023-06-13 DIAGNOSIS — Z992 Dependence on renal dialysis: Secondary | ICD-10-CM | POA: Diagnosis not present

## 2023-06-13 DIAGNOSIS — N186 End stage renal disease: Secondary | ICD-10-CM | POA: Diagnosis not present

## 2023-06-15 DIAGNOSIS — D689 Coagulation defect, unspecified: Secondary | ICD-10-CM | POA: Diagnosis not present

## 2023-06-15 DIAGNOSIS — D509 Iron deficiency anemia, unspecified: Secondary | ICD-10-CM | POA: Diagnosis not present

## 2023-06-15 DIAGNOSIS — N186 End stage renal disease: Secondary | ICD-10-CM | POA: Diagnosis not present

## 2023-06-15 DIAGNOSIS — D631 Anemia in chronic kidney disease: Secondary | ICD-10-CM | POA: Diagnosis not present

## 2023-06-15 DIAGNOSIS — Z992 Dependence on renal dialysis: Secondary | ICD-10-CM | POA: Diagnosis not present

## 2023-06-15 DIAGNOSIS — R52 Pain, unspecified: Secondary | ICD-10-CM | POA: Diagnosis not present

## 2023-06-15 DIAGNOSIS — N2581 Secondary hyperparathyroidism of renal origin: Secondary | ICD-10-CM | POA: Diagnosis not present

## 2023-06-17 DIAGNOSIS — N2581 Secondary hyperparathyroidism of renal origin: Secondary | ICD-10-CM | POA: Diagnosis not present

## 2023-06-17 DIAGNOSIS — D509 Iron deficiency anemia, unspecified: Secondary | ICD-10-CM | POA: Diagnosis not present

## 2023-06-17 DIAGNOSIS — D631 Anemia in chronic kidney disease: Secondary | ICD-10-CM | POA: Diagnosis not present

## 2023-06-17 DIAGNOSIS — Z992 Dependence on renal dialysis: Secondary | ICD-10-CM | POA: Diagnosis not present

## 2023-06-17 DIAGNOSIS — N186 End stage renal disease: Secondary | ICD-10-CM | POA: Diagnosis not present

## 2023-06-17 DIAGNOSIS — D689 Coagulation defect, unspecified: Secondary | ICD-10-CM | POA: Diagnosis not present

## 2023-06-17 DIAGNOSIS — R52 Pain, unspecified: Secondary | ICD-10-CM | POA: Diagnosis not present

## 2023-06-20 DIAGNOSIS — D509 Iron deficiency anemia, unspecified: Secondary | ICD-10-CM | POA: Diagnosis not present

## 2023-06-20 DIAGNOSIS — D631 Anemia in chronic kidney disease: Secondary | ICD-10-CM | POA: Diagnosis not present

## 2023-06-20 DIAGNOSIS — Z992 Dependence on renal dialysis: Secondary | ICD-10-CM | POA: Diagnosis not present

## 2023-06-20 DIAGNOSIS — N186 End stage renal disease: Secondary | ICD-10-CM | POA: Diagnosis not present

## 2023-06-20 DIAGNOSIS — D689 Coagulation defect, unspecified: Secondary | ICD-10-CM | POA: Diagnosis not present

## 2023-06-20 DIAGNOSIS — N2581 Secondary hyperparathyroidism of renal origin: Secondary | ICD-10-CM | POA: Diagnosis not present

## 2023-06-20 DIAGNOSIS — R52 Pain, unspecified: Secondary | ICD-10-CM | POA: Diagnosis not present

## 2023-06-22 DIAGNOSIS — D509 Iron deficiency anemia, unspecified: Secondary | ICD-10-CM | POA: Diagnosis not present

## 2023-06-22 DIAGNOSIS — D631 Anemia in chronic kidney disease: Secondary | ICD-10-CM | POA: Diagnosis not present

## 2023-06-22 DIAGNOSIS — N2581 Secondary hyperparathyroidism of renal origin: Secondary | ICD-10-CM | POA: Diagnosis not present

## 2023-06-22 DIAGNOSIS — R52 Pain, unspecified: Secondary | ICD-10-CM | POA: Diagnosis not present

## 2023-06-22 DIAGNOSIS — D689 Coagulation defect, unspecified: Secondary | ICD-10-CM | POA: Diagnosis not present

## 2023-06-22 DIAGNOSIS — Z992 Dependence on renal dialysis: Secondary | ICD-10-CM | POA: Diagnosis not present

## 2023-06-22 DIAGNOSIS — N186 End stage renal disease: Secondary | ICD-10-CM | POA: Diagnosis not present

## 2023-06-24 DIAGNOSIS — D509 Iron deficiency anemia, unspecified: Secondary | ICD-10-CM | POA: Diagnosis not present

## 2023-06-24 DIAGNOSIS — D689 Coagulation defect, unspecified: Secondary | ICD-10-CM | POA: Diagnosis not present

## 2023-06-24 DIAGNOSIS — N186 End stage renal disease: Secondary | ICD-10-CM | POA: Diagnosis not present

## 2023-06-24 DIAGNOSIS — D631 Anemia in chronic kidney disease: Secondary | ICD-10-CM | POA: Diagnosis not present

## 2023-06-24 DIAGNOSIS — N2581 Secondary hyperparathyroidism of renal origin: Secondary | ICD-10-CM | POA: Diagnosis not present

## 2023-06-24 DIAGNOSIS — R52 Pain, unspecified: Secondary | ICD-10-CM | POA: Diagnosis not present

## 2023-06-24 DIAGNOSIS — Z992 Dependence on renal dialysis: Secondary | ICD-10-CM | POA: Diagnosis not present

## 2023-07-01 DIAGNOSIS — Z992 Dependence on renal dialysis: Secondary | ICD-10-CM | POA: Diagnosis not present

## 2023-07-01 DIAGNOSIS — E1122 Type 2 diabetes mellitus with diabetic chronic kidney disease: Secondary | ICD-10-CM | POA: Diagnosis not present

## 2023-07-01 DIAGNOSIS — N186 End stage renal disease: Secondary | ICD-10-CM | POA: Diagnosis not present

## 2023-07-31 DIAGNOSIS — N186 End stage renal disease: Secondary | ICD-10-CM | POA: Diagnosis not present

## 2023-07-31 DIAGNOSIS — Z992 Dependence on renal dialysis: Secondary | ICD-10-CM | POA: Diagnosis not present

## 2023-07-31 DIAGNOSIS — E1122 Type 2 diabetes mellitus with diabetic chronic kidney disease: Secondary | ICD-10-CM | POA: Diagnosis not present

## 2023-08-31 DIAGNOSIS — Z992 Dependence on renal dialysis: Secondary | ICD-10-CM | POA: Diagnosis not present

## 2023-08-31 DIAGNOSIS — N186 End stage renal disease: Secondary | ICD-10-CM | POA: Diagnosis not present

## 2023-08-31 DIAGNOSIS — E1122 Type 2 diabetes mellitus with diabetic chronic kidney disease: Secondary | ICD-10-CM | POA: Diagnosis not present

## 2023-09-30 DIAGNOSIS — Z992 Dependence on renal dialysis: Secondary | ICD-10-CM | POA: Diagnosis not present

## 2023-09-30 DIAGNOSIS — N186 End stage renal disease: Secondary | ICD-10-CM | POA: Diagnosis not present

## 2023-09-30 DIAGNOSIS — E1122 Type 2 diabetes mellitus with diabetic chronic kidney disease: Secondary | ICD-10-CM | POA: Diagnosis not present

## 2023-10-31 DIAGNOSIS — E1122 Type 2 diabetes mellitus with diabetic chronic kidney disease: Secondary | ICD-10-CM | POA: Diagnosis not present

## 2023-10-31 DIAGNOSIS — N186 End stage renal disease: Secondary | ICD-10-CM | POA: Diagnosis not present

## 2023-10-31 DIAGNOSIS — Z992 Dependence on renal dialysis: Secondary | ICD-10-CM | POA: Diagnosis not present

## 2024-03-05 ENCOUNTER — Encounter (HOSPITAL_COMMUNITY)
Admission: RE | Disposition: A | Discharge status: Home / Self Care | Payer: Self-pay | Source: Home / Self Care | Attending: Nephrology

## 2024-03-05 ENCOUNTER — Ambulatory Visit (HOSPITAL_COMMUNITY)
Admission: RE | Admit: 2024-03-05 | Discharge: 2024-03-05 | Disposition: A | Discharge status: Home / Self Care | Attending: Nephrology | Admitting: Nephrology

## 2024-03-05 ENCOUNTER — Other Ambulatory Visit: Payer: Self-pay

## 2024-03-05 DIAGNOSIS — D631 Anemia in chronic kidney disease: Secondary | ICD-10-CM | POA: Diagnosis not present

## 2024-03-05 DIAGNOSIS — E1122 Type 2 diabetes mellitus with diabetic chronic kidney disease: Secondary | ICD-10-CM | POA: Diagnosis not present

## 2024-03-05 DIAGNOSIS — Z8673 Personal history of transient ischemic attack (TIA), and cerebral infarction without residual deficits: Secondary | ICD-10-CM | POA: Diagnosis not present

## 2024-03-05 DIAGNOSIS — T82858A Stenosis of vascular prosthetic devices, implants and grafts, initial encounter: Secondary | ICD-10-CM | POA: Diagnosis not present

## 2024-03-05 DIAGNOSIS — N250 Renal osteodystrophy: Secondary | ICD-10-CM | POA: Diagnosis not present

## 2024-03-05 DIAGNOSIS — Y832 Surgical operation with anastomosis, bypass or graft as the cause of abnormal reaction of the patient, or of later complication, without mention of misadventure at the time of the procedure: Secondary | ICD-10-CM | POA: Insufficient documentation

## 2024-03-05 DIAGNOSIS — N186 End stage renal disease: Secondary | ICD-10-CM | POA: Insufficient documentation

## 2024-03-05 DIAGNOSIS — Z992 Dependence on renal dialysis: Secondary | ICD-10-CM | POA: Diagnosis not present

## 2024-03-05 DIAGNOSIS — I120 Hypertensive chronic kidney disease with stage 5 chronic kidney disease or end stage renal disease: Secondary | ICD-10-CM | POA: Insufficient documentation

## 2024-03-05 DIAGNOSIS — Z79899 Other long term (current) drug therapy: Secondary | ICD-10-CM | POA: Insufficient documentation

## 2024-03-05 DIAGNOSIS — E785 Hyperlipidemia, unspecified: Secondary | ICD-10-CM | POA: Diagnosis not present

## 2024-03-05 DIAGNOSIS — T82868A Thrombosis of vascular prosthetic devices, implants and grafts, initial encounter: Secondary | ICD-10-CM | POA: Insufficient documentation

## 2024-03-05 HISTORY — PX: PERIPHERAL VASCULAR THROMBECTOMY: CATH118306

## 2024-03-05 HISTORY — PX: A/V SHUNT INTERVENTION: CATH118220

## 2024-03-05 SURGERY — A/V SHUNT INTERVENTION
Anesthesia: LOCAL

## 2024-03-05 MED ORDER — MIDAZOLAM HCL 2 MG/2ML IJ SOLN
INTRAMUSCULAR | Status: AC
  Filled 2024-03-05: qty 2

## 2024-03-05 MED ORDER — LIDOCAINE HCL (PF) 1 % IJ SOLN
INTRAMUSCULAR | Status: DC | PRN
  Administered 2024-03-05: 5 mL via INTRADERMAL

## 2024-03-05 MED ORDER — FENTANYL CITRATE (PF) 100 MCG/2ML IJ SOLN
INTRAMUSCULAR | Status: DC | PRN
  Administered 2024-03-05: 50 ug via INTRAVENOUS

## 2024-03-05 MED ORDER — IODIXANOL 320 MG/ML IV SOLN
INTRAVENOUS | Status: DC | PRN
  Administered 2024-03-05: 10 mL

## 2024-03-05 MED ORDER — LIDOCAINE HCL (PF) 1 % IJ SOLN
INTRAMUSCULAR | Status: AC
  Filled 2024-03-05: qty 30

## 2024-03-05 MED ORDER — HEPARIN SODIUM (PORCINE) 1000 UNIT/ML IJ SOLN
INTRAMUSCULAR | Status: DC | PRN
  Administered 2024-03-05: 5000 [IU] via INTRAVENOUS

## 2024-03-05 MED ORDER — HEPARIN SODIUM (PORCINE) 1000 UNIT/ML IJ SOLN
INTRAMUSCULAR | Status: AC
  Filled 2024-03-05: qty 10

## 2024-03-05 MED ORDER — FENTANYL CITRATE (PF) 100 MCG/2ML IJ SOLN
INTRAMUSCULAR | Status: AC
  Filled 2024-03-05: qty 2

## 2024-03-05 MED ORDER — HEPARIN (PORCINE) IN NACL 2000-0.9 UNIT/L-% IV SOLN
INTRAVENOUS | Status: DC | PRN
  Administered 2024-03-05: 1000 mL

## 2024-03-05 MED ORDER — MIDAZOLAM HCL 2 MG/2ML IJ SOLN
INTRAMUSCULAR | Status: DC | PRN
  Administered 2024-03-05: 1 mg via INTRAVENOUS

## 2024-03-05 SURGICAL SUPPLY — 14 items
BAG SNAP BAND KOVER 36X36 (MISCELLANEOUS) ×2 IMPLANT
BALLOON FOGARTY 5FR 40 (CATHETERS) IMPLANT
BALLOON MUSTANG 8X80X75 (BALLOONS) IMPLANT
CATH STR 7FR 55 BRITE (CATHETERS) IMPLANT
COVER DOME SNAP 22 D (MISCELLANEOUS) ×2 IMPLANT
GUIDEWIRE ANGLED .035 180CM (WIRE) IMPLANT
SHEATH PINNACLE R/O II 5F 6CM (SHEATH) IMPLANT
SHEATH PINNACLE R/O II 7F 4CM (SHEATH) IMPLANT
SHEATH PROBE COVER 6X72 (BAG) ×2 IMPLANT
STATION PROTECTION PRESSURIZED (MISCELLANEOUS) ×2 IMPLANT
STOPCOCK MORSE 400PSI 3WAY (MISCELLANEOUS) ×2 IMPLANT
SYR MEDALLION 10ML (SYRINGE) IMPLANT
TRAY PV CATH (CUSTOM PROCEDURE TRAY) ×2 IMPLANT
TUBING CIL FLEX 10 FLL-RA (TUBING) ×2 IMPLANT

## 2024-03-05 NOTE — H&P (Addendum)
 Chief Complaint: Clotted access  Interval H&P  The patient has presented today for an angiogram/ angioplasty.  Various methods of treatment have been discussed with the patient.  After consideration of risk, benefits and other options for treatment, the patient has consented to a angiogram/ angioplasty with  possible stent placement.   Risks of angiogram with potential angioplasty and stenting if needed.contrast reaction, extravasation/ bleeding, dissection, hypotension and death were explained to the patient.  The patient's history has been reviewed and the patient has been examined, no changes in status.  Stable for angiogram/angioplasty  I have reviewed the patient's chart and labs.  Questions were answered to the patient's satisfaction.  Assessment/Plan: ESRD  Clotted access  - planning on declot with angioplasty; possibly catheter insertion.  This is a thrombosed left upper arm AV graft placed on December 21, 2020.Ruben Camacho Renal osteodystrophy - continue binders per home regimen. Anemia - managed with ESA's and IV iron  at dialysis center. HTN - resume home regimen.   HPI: Ruben Camacho. is an 68 y.o. male with history of CVA, hypertension, DM, hyperlipidemia, ESRD a clotted left upper arm AV graft placed by Dr. Shaunna Delaware on December 21, 2020.  ROS Per HPI.  Chemistry and CBC: Creat  Date/Time Value Ref Range Status  08/19/2015 08:44 AM 1.28 0.70 - 1.33 mg/dL Final  16/08/9603 54:09 AM 1.07 0.50 - 1.35 mg/dL Final  81/19/1478 29:56 AM 1.22 0.50 - 1.35 mg/dL Final  21/30/8657 84:69 AM 1.28 0.50 - 1.35 mg/dL Final  62/95/2841 32:44 AM 1.27 0.50 - 1.35 mg/dL Final   Creatinine, Ser  Date/Time Value Ref Range Status  12/23/2022 08:09 AM 4.60 (H) 0.61 - 1.24 mg/dL Final  11/02/7251 66:44 PM 7.82 (H) 0.61 - 1.24 mg/dL Final  03/47/4259 56:38 PM 5.00 (H) 0.61 - 1.24 mg/dL Final  75/64/3329 51:88 AM 5.00 (H) 0.61 - 1.24 mg/dL Final  41/66/0630 16:01 PM 6.25 (H) 0.61 - 1.24 mg/dL Final   09/32/3557 32:20 PM 5.51 (H) 0.61 - 1.24 mg/dL Final  25/42/7062 37:62 AM 6.55 (H) 0.61 - 1.24 mg/dL Final    Comment:    DELTA CHECK NOTED DIALYSIS   01/03/2021 03:42 AM 3.71 (H) 0.61 - 1.24 mg/dL Final  83/15/1761 60:73 AM 5.38 (H) 0.61 - 1.24 mg/dL Final  71/03/2693 85:46 AM 3.83 (H) 0.61 - 1.24 mg/dL Final  27/12/5007 38:18 AM 5.27 (H) 0.61 - 1.24 mg/dL Final    Comment:    DELTA CHECK NOTED  12/30/2020 01:36 AM 3.40 (H) 0.61 - 1.24 mg/dL Final    Comment:    DELTA CHECK NOTED  12/29/2020 07:25 AM 5.21 (H) 0.61 - 1.24 mg/dL Final  29/93/7169 67:89 AM 3.30 (H) 0.61 - 1.24 mg/dL Final    Comment:    DELTA CHECK NOTED  12/26/2020 01:26 AM 5.16 (H) 0.61 - 1.24 mg/dL Final  38/07/1750 02:58 AM 4.32 (H) 0.61 - 1.24 mg/dL Final  52/77/8242 35:36 AM 5.22 (H) 0.61 - 1.24 mg/dL Final  14/43/1540 08:67 AM 4.31 (H) 0.61 - 1.24 mg/dL Final    Comment:    DELTA CHECK NOTED  12/21/2020 03:07 PM 7.09 (H) 0.61 - 1.24 mg/dL Final  61/95/0932 67:12 AM 6.58 (H) 0.61 - 1.24 mg/dL Final  45/80/9983 38:25 AM 6.12 (H) 0.61 - 1.24 mg/dL Final  05/39/7673 41:93 AM 5.35 (H) 0.61 - 1.24 mg/dL Final  79/12/4095 35:32 AM 4.98 (H) 0.61 - 1.24 mg/dL Final  99/24/2683 41:96 AM 4.72 (H) 0.61 - 1.24 mg/dL Final  22/29/7989 21:19 AM 5.03 (  H) 0.61 - 1.24 mg/dL Final  16/08/9603 54:09 AM 5.03 (H) 0.61 - 1.24 mg/dL Final  81/19/1478 29:56 AM 2.60 (H) 0.61 - 1.24 mg/dL Final  21/30/8657 84:69 AM 2.61 (H) 0.61 - 1.24 mg/dL Final  62/95/2841 32:44 AM 2.17 (H) 0.61 - 1.24 mg/dL Final  11/02/7251 66:44 PM 2.34 (H) 0.76 - 1.27 mg/dL Final  03/47/4259 56:38 PM 1.81 (H) 0.76 - 1.27 mg/dL Final  75/64/3329 51:88 PM 1.47 (H) 0.76 - 1.27 mg/dL Final  41/66/0630 16:01 AM 1.00 0.50 - 1.35 mg/dL Final  09/32/3557 32:20 AM 1.07 0.50 - 1.35 mg/dL Final   No results for input(s): "NA", "K", "CL", "CO2", "GLUCOSE", "BUN", "CREATININE", "CALCIUM ", "PHOS" in the last 168 hours.  Invalid input(s): "ALB" No results for  input(s): "WBC", "NEUTROABS", "HGB", "HCT", "MCV", "PLT" in the last 168 hours. Liver Function Tests: No results for input(s): "AST", "ALT", "ALKPHOS", "BILITOT", "PROT", "ALBUMIN" in the last 168 hours. No results for input(s): "LIPASE", "AMYLASE" in the last 168 hours. No results for input(s): "AMMONIA" in the last 168 hours. Cardiac Enzymes: No results for input(s): "CKTOTAL", "CKMB", "CKMBINDEX", "TROPONINI" in the last 168 hours. Iron  Studies: No results for input(s): "IRON ", "TIBC", "TRANSFERRIN", "FERRITIN" in the last 72 hours. PT/INR: @LABRCNTIP (inr:5)  Xrays/Other Studies: )No results found for this or any previous visit (from the past 48 hours). No results found.  PMH:   Past Medical History:  Diagnosis Date   Chronic kidney disease    Tu/Th/Sa   Diabetes mellitus without complication (HCC)    Type II- does not take medicines or check levels at home.   Gout    1-2 exacerbations per year.  Takes Advil PRN for pain.   Hyperlipidemia    Hypertension    Migraine    Stroke (HCC) 07/01/2012   L sided weakness, slurred speech.  Admission [facility].     Vitamin D  deficiency 08/09/2019    PSH:   Past Surgical History:  Procedure Laterality Date   Admission  07/01/2012   CVA (L sided weakness, slurred speech).  [facility].   AV FISTULA PLACEMENT Left 12/21/2020   Procedure: INSERTION OF ARTERIOVENOUS (AV) GORE-TEX GRAFT ARM;  Surgeon: Dannis Dy, MD;  Location: [facility] OR;  Service: Vascular;  Laterality: Left;   CAPD INSERTION N/A 09/29/2022   Procedure: LAPAROSCOPIC INSERTION CONTINUOUS AMBULATORY PERITONEAL DIALYSIS  (CAPD) CATHETER;  Surgeon: Margherita Shell, MD;  Location: [facility] OR;  Service: Vascular;  Laterality: N/A;   CAPD REMOVAL N/A 12/23/2022   Procedure: REMOVAL CONTINUOUS AMBULATORY PERITONEAL DIALYSIS  (CAPD) CATHETER;  Surgeon: Margherita Shell, MD;  Location: [facility] OR;  Service: Vascular;  Laterality: N/A;   COLONOSCOPY  10/31/2008   no polyps.  Repeat in  10 years.  [place].   HERNIA REPAIR     INSERTION OF DIALYSIS CATHETER Right 12/21/2020   Procedure: INSERTION OF TUNNELED  DIALYSIS CATHETER RIGHT INTERNAL JUGULAR;  Surgeon: Dannis Dy, MD;  Location: [facility] OR;  Service: Vascular;  Laterality: Right;    Allergies: No Known Allergies  Medications:   Prior to Admission medications   Medication Sig Start Date End Date Taking? Authorizing Provider  labetalol  (NORMODYNE ) 200 MG tablet Take 200 mg by mouth 2 (two) times daily. 11/10/22  Yes [provider]  losartan  (COZAAR ) 50 MG tablet Take 50 mg by mouth daily.   Yes [provider]  torsemide (DEMADEX) 100 MG tablet Take 100 mg by mouth daily. 11/07/23  Yes [provider]  colchicine  0.6 MG  tablet Take one pill an hour after your first dose in the ED Patient not taking: Reported on 08/25/2020 08/10/20 08/25/20  Floyd, Dan, DO    Discontinued Meds:   Medications Discontinued During This Encounter  Medication Reason   ferric citrate  (AURYXIA ) 1 GM 210 MG(Fe) tablet Change in therapy   calcitRIOL (ROCALTROL) 0.5 MCG capsule Change in therapy    Social History:  reports that he has never smoked. He has been exposed to tobacco smoke. He has never used smokeless tobacco. He reports that he does not drink alcohol and does not use drugs.  Family History:   Family History  Problem Relation Age of Onset   Stroke Mother    Hypertension Mother    Hypertension Daughter    Cancer Maternal Grandmother        lung   Healthy Father        Died in motorcycle accident    Blood pressure 124/64, pulse 66, resp. rate 14, SpO2 98%. GEN: NAD, A&Ox3, NCAT HEENT: No conjunctival pallor, EOMI NECK: Supple, no thyromegaly LUNGS: CTA B/L no rales, rhonchi or wheezing CV: RRR, No M/R/G ABD: SNDNT +BS  ACCESS: Left upper arm AV graft no bruit        Ruben Camacho, Alveda Aures, MD 03/05/2024, 12:36 PM

## 2024-03-05 NOTE — Op Note (Signed)
 Patient referred for a thrombectomy of his left upper arm AVG placed on December 21, 2020 by Dr. Shaunna Delaware. On examination there is no pulse or bruit in the left upper arm arc graft. The left radial pulse is 1+.   Summary:  1) Successful thrombectomy of a left upper arm arc AVG with evidence of 50-70% NS anastomosis and also venous limb intragraft stenosis which was corrected with angioplasty (8x8 Mustang FE ~15-18 ATM). 2) Arterial anastomosis and inflow graft segments are patent. 3) Patent left axillary vein and left sided central veins. 4) This left upper arm arc graft remains amenable to future percutaneous intervention.  Description of procedure: The left arm was prepped and draped in the usual fashion. The left upper arm arc AVG was first cannulated (40981) in the arterial limb in an antegrade direction and then a 7Fr sheath was inserted by guidewire exchange technique. A 0.035 guidewire was passed through the clotted graft into the central veins under fluoroscopic guidance and then a 5Fr diagnostic catheter inserted over the wire. The wire was removed, and then intravenous heparin  and sedative drugs administered. A pullback angiogram was performed by injecting contrast ([id number]) via the diagnostic catheter. This showed patent central veins, patent left axillary vein, 50-70% venous anastomosis stenosis, 50% venous limb intragraft stenosis and evidence of filling defects in the graft consistent with thrombus.  Three passes were made with the 7Fr [facility] 1 catheter from the axillary vein to the antegrade sheath to physically remove thrombus and perform the thrombectomy (82956). The catheter was removed, and an 8 x 8 Mustang angioplasty balloon was inserted over the wire to the level of the venous anastomosis and venous limb intragraft lesion followed by venous angioplasty (21308) carried out to 15-18ATM at only the site of stenosis to FULL effacement and then more gently in the rest of the graft circuit to  macerate the thrombus.    In order to remove this clot and remove the platelet plug at the arterial anastomosis, a second cannulation (65784) was required in a retrograde direction. A 7Fr sheath was inserted by guidewire exchange technique. A 4Fr Fogarty catheter was inserted through the sheath and advanced into the proximal brachial artery. The over the wire embolectomy balloon was inflated with 0.6cc of contrast and then pulled across the arterial anastomosis into the graft with aspiration of the sheath via the side port to remove the platelet plug and thrombus. [phone]). This step was repeated to sweep all residual clot from this side of the graft. In order to check the arterial inflow an arteriogram was necessary because refluxing contrast from the graft would have caused risk of embolizing thrombus from the graft into the arterial vasculature. A glidewire and straight catheter were inserted into the proximal brachial artery and an arteriogram performed by parking the tip of the catheter 2cm proximal to the arterial anastomosis. The arteriogram documented a good calibered proximal and distal brachial artery with slow distal runoff and no evidence of distal emboli. Follow up angiogram showed good blood flow through the graft circuit with some evidence of retained thrombus within the body of the graft. I then polished the entire outflow tract/graft circuit with the partially inflated 8mm angioplasty balloon to alleviate thrombus burden.  Completion outflow angiogram revealed rapid flows, no further retained thrombus burden, 10% residual stenosis at the mid-intragraft lesion with no dissection or extravasation. The graft had a good thrill and bruit.  Hemostasis: A 3-0 ethilon purse string suture was placed at the cannulation site on  removal of the sheath.  Sedation: 1 mg Versed , 50 mcg Fentanyl . Sedation time. 17  minutes  Contrast. 10 mL  Monitoring: Because of the patient's comorbid conditions and  sedation during the procedure, continuous EKG monitoring and O2 saturation monitoring was performed throughout the procedure by the RN. There were no abnormal arrhythmias encountered.  Complications: None.   Diagnoses: N18.6 End stage renal disease  T82.858A Stenosis of vascular prosthetic devices, implants and grafts, initial encounter T82.868A Thrombosis of vascular prosthetic device/graft, initial  Procedure Coding:  [phone] Cannulation and angiogram of fistula/ graft, thrombectomy and venous angioplasty U0454 Contrast  Recommendations:  1. Continue to cannulate the graft with 15G needles.  2. Refer back for problems with flows. 3. Remove the sutures next treatment.   Discharge: The patient was discharged home in stable condition. The patient was given education regarding the care of the dialysis access AVF and specific instructions in case of any problems.

## 2024-03-05 NOTE — Discharge Instructions (Signed)

## 2024-03-06 ENCOUNTER — Encounter (HOSPITAL_COMMUNITY): Payer: Self-pay | Admitting: Nephrology

## 2024-03-22 ENCOUNTER — Encounter (HOSPITAL_COMMUNITY)
Admission: RE | Disposition: A | Discharge status: Home / Self Care | Payer: Self-pay | Source: Home / Self Care | Attending: Vascular Surgery

## 2024-03-22 ENCOUNTER — Other Ambulatory Visit: Payer: Self-pay

## 2024-03-22 ENCOUNTER — Ambulatory Visit (HOSPITAL_COMMUNITY)
Admission: RE | Admit: 2024-03-22 | Discharge: 2024-03-22 | Disposition: A | Discharge status: Home / Self Care | Attending: Vascular Surgery | Admitting: Vascular Surgery

## 2024-03-22 DIAGNOSIS — T82858A Stenosis of vascular prosthetic devices, implants and grafts, initial encounter: Secondary | ICD-10-CM

## 2024-03-22 DIAGNOSIS — Z8673 Personal history of transient ischemic attack (TIA), and cerebral infarction without residual deficits: Secondary | ICD-10-CM | POA: Insufficient documentation

## 2024-03-22 DIAGNOSIS — N186 End stage renal disease: Secondary | ICD-10-CM | POA: Diagnosis not present

## 2024-03-22 DIAGNOSIS — M109 Gout, unspecified: Secondary | ICD-10-CM | POA: Insufficient documentation

## 2024-03-22 DIAGNOSIS — E1122 Type 2 diabetes mellitus with diabetic chronic kidney disease: Secondary | ICD-10-CM | POA: Insufficient documentation

## 2024-03-22 DIAGNOSIS — I120 Hypertensive chronic kidney disease with stage 5 chronic kidney disease or end stage renal disease: Secondary | ICD-10-CM | POA: Diagnosis not present

## 2024-03-22 DIAGNOSIS — Y832 Surgical operation with anastomosis, bypass or graft as the cause of abnormal reaction of the patient, or of later complication, without mention of misadventure at the time of the procedure: Secondary | ICD-10-CM | POA: Insufficient documentation

## 2024-03-22 DIAGNOSIS — Z79899 Other long term (current) drug therapy: Secondary | ICD-10-CM | POA: Diagnosis not present

## 2024-03-22 DIAGNOSIS — T82868A Thrombosis of vascular prosthetic devices, implants and grafts, initial encounter: Secondary | ICD-10-CM

## 2024-03-22 DIAGNOSIS — E785 Hyperlipidemia, unspecified: Secondary | ICD-10-CM | POA: Insufficient documentation

## 2024-03-22 DIAGNOSIS — Z992 Dependence on renal dialysis: Secondary | ICD-10-CM | POA: Diagnosis not present

## 2024-03-22 HISTORY — PX: VENOUS ANGIOPLASTY: CATH118376

## 2024-03-22 HISTORY — PX: UPPER EXTREMITY INTERVENTION: CATH118271

## 2024-03-22 HISTORY — PX: A/V SHUNT INTERVENTION: CATH118220

## 2024-03-22 SURGERY — A/V SHUNT INTERVENTION
Anesthesia: LOCAL

## 2024-03-22 MED ORDER — LIDOCAINE HCL (PF) 1 % IJ SOLN
INTRAMUSCULAR | Status: DC | PRN
  Administered 2024-03-22 (×2): 5 mL via SUBCUTANEOUS

## 2024-03-22 MED ORDER — HEPARIN (PORCINE) IN NACL 1000-0.9 UT/500ML-% IV SOLN
INTRAVENOUS | Status: DC | PRN
  Administered 2024-03-22: 500 mL

## 2024-03-22 MED ORDER — HEPARIN SODIUM (PORCINE) 1000 UNIT/ML IJ SOLN
INTRAMUSCULAR | Status: DC | PRN
  Administered 2024-03-22: 8000 [IU] via INTRAVENOUS

## 2024-03-22 MED ORDER — HEPARIN SODIUM (PORCINE) 1000 UNIT/ML IJ SOLN
INTRAMUSCULAR | Status: AC
  Filled 2024-03-22: qty 10

## 2024-03-22 MED ORDER — LIDOCAINE HCL (PF) 1 % IJ SOLN
INTRAMUSCULAR | Status: AC
  Filled 2024-03-22: qty 30

## 2024-03-22 MED ORDER — IODIXANOL 320 MG/ML IV SOLN
INTRAVENOUS | Status: DC | PRN
  Administered 2024-03-22: 35 mL via INTRAVENOUS

## 2024-03-22 SURGICAL SUPPLY — 11 items
BALLOON FOGARTY 5FR 40 (CATHETERS) IMPLANT
BALLOON MUSTANG 8X20X75 (BALLOONS) IMPLANT
CATH GUIDE BRITETIP 7F (CATHETERS) IMPLANT
KIT ENCORE 40 (KITS) IMPLANT
KIT MICROPUNCTURE NIT STIFF (SHEATH) IMPLANT
SHEATH PINNACLE R/O II 6F 4CM (SHEATH) IMPLANT
SHEATH PINNACLE R/O II 7F 4CM (SHEATH) IMPLANT
SHEATH PROBE COVER 6X72 (BAG) IMPLANT
STENT VIABAHN 8X50X75 (Permanent Stent) IMPLANT
TRAY PV CATH (CUSTOM PROCEDURE TRAY) ×3 IMPLANT
WIRE BENTSON .035X145CM (WIRE) IMPLANT

## 2024-03-22 NOTE — Op Note (Signed)
 Patient name: Ruben Camacho. MRN: [id number] DOB: 24-Nov-1955 Sex: male  03/22/2024 Pre-operative Diagnosis: End-stage renal disease, thrombosed left upper extremity AV graft Post-operative diagnosis:  Same Surgeon:  Sarajane Cumming. Vikki Graves, MD Procedure Performed: 1.  Percutaneous ultrasound-guided access left upper arm AV graft and antegrade and retrograde directions 2.  Percutaneous mechanical thrombectomy left upper extremity AV graft with Cordis Brite tip catheter towards the venous limb and over the wire 5 Fogarty directed towards the arterial limb 3.  Left upper extremity shuntogram 4.  Stent of left axillary vein to graft anastomosis with 8 x 5 cm Viabahn postdilated with a millimeter balloon   Indications: 68 year old male with history of end-stage renal disease on dialysis via left upper arm AV graft originally placed over 3 years ago.  This has undergone mechanical thrombectomy recently and at that time there was notable disease at the arteriovenous anastomosis.  The graft was recently noted to be thrombosed and he is now indicated for repeat attempt at thrombectomy.  Findings: The graft was thrombosed.  We were able to aspirate acute appearing thrombus using the Cordis Brite tip catheter and then macerate the thrombus using an 8 mm balloon.  A 5 Fogarty was used to pull the arterial plug and at completion there was very strong flow through the graft.  There was greater than 50% stenosis at the graft venous anastomosis and this was primarily stented with an 8 mm Viabahn to 0% residual stenosis and at completion there was very strong thrill in the graft and a palpable radial artery pulse the wrist.  Graft is ready for immediate use and remains amenable to future percutaneous intervention.   Procedure:  The patient was identified in the holding area and taken to the procedure room where he was placed supine on the operative table and sterilely prepped and draped in the left upper extremity  usual fashion and a timeout was called.  We began using ultrasound to cannulate the graft towards the venous limb this was done with micropuncture needle followed by wire and sheath.  I then placed a Bentson wire followed by 7 French sheath directed centrally and then used the Cordis Brite tip catheter to confirm intraluminal access.  We then administered 8000 units of heparin  through the catheter.  We then did pullback aspiration of the graft for a total of 5 aspirations that did retrieve significant amount of acute appearing thrombus.  I then used a 8 x 2 cm balloon to macerate the thrombus centrally.  I then directed my attention towards the arterial limb using ultrasound to cannulate the graft with micropuncture needle followed by wire and a sheath.  We then placed a Bentson wire into the brachial artery distally and then used an over-the-wire Fogarty to pull the plug twice.  There was an very strong inflow through the graft.  We did perform angiography towards the arterial limb with brisk flow towards the venous limb to suggest there are no inflow issues.  We then suture-ligated the cannulation towards the arterial limb and remove the sheath and wire.  Attention was then turned towards the venous limb.  Completion angiography demonstrated a stenosis at the venous anastomosis and this was primarily stented with an 8 x 5 cm Viabahn postdilated with 8 mm balloon.  Completion demonstrated no residual stenosis with brisk flow centrally and there was a very strong thrill in the graft.  Satisfied with this we removed the sheath and wire and suture ligated the cannulation site.  There was a palpable radial artery pulse at completion.  The patient tolerated the procedure without any complication.   Contrast: 35cc  Mellissa Conley C. Vikki Graves, MD [facility] Office: [phone] Pager: [phone]

## 2024-03-22 NOTE — H&P (Signed)
 H&P   History of Present Illness: This is a 68 y.o. male with end-stage renal disease on dialysis via left upper arm AV graft which recently underwent thrombectomy.  Graft was originally placed by Dr. Shaunna Delaware over 3 years ago.  Past Medical History:  Diagnosis Date   Chronic kidney disease    Tu/Th/Sa   Diabetes mellitus without complication (HCC)    Type II- does not take medicines or check levels at home.   Gout    1-2 exacerbations per year.  Takes Advil PRN for pain.   Hyperlipidemia    Hypertension    Migraine    Stroke (HCC) 07/01/2012   L sided weakness, slurred speech.  Admission [facility].     Vitamin D  deficiency 08/09/2019    Past Surgical History:  Procedure Laterality Date   A/V SHUNT INTERVENTION N/A 03/05/2024   Procedure: A/V SHUNT INTERVENTION;  Surgeon: Patrick Boor, MD;  Location: [facility] INVASIVE CV LAB;  Service: Cardiovascular;  Laterality: N/A;   Admission  07/01/2012   CVA (L sided weakness, slurred speech).  [facility].   AV FISTULA PLACEMENT Left 12/21/2020   Procedure: INSERTION OF ARTERIOVENOUS (AV) GORE-TEX GRAFT ARM;  Surgeon: Dannis Dy, MD;  Location: [facility];  Service: Vascular;  Laterality: Left;   CAPD INSERTION N/A 09/29/2022   Procedure: LAPAROSCOPIC INSERTION CONTINUOUS AMBULATORY PERITONEAL DIALYSIS  (CAPD) CATHETER;  Surgeon: Margherita Shell, MD;  Location: [facility] OR;  Service: Vascular;  Laterality: N/A;   CAPD REMOVAL N/A 12/23/2022   Procedure: REMOVAL CONTINUOUS AMBULATORY PERITONEAL DIALYSIS  (CAPD) CATHETER;  Surgeon: Margherita Shell, MD;  Location: [facility] OR;  Service: Vascular;  Laterality: N/A;   COLONOSCOPY  10/31/2008   no polyps.  Repeat in 10 years.  [place].   HERNIA REPAIR     INSERTION OF DIALYSIS CATHETER Right 12/21/2020   Procedure: INSERTION OF TUNNELED  DIALYSIS CATHETER RIGHT INTERNAL JUGULAR;  Surgeon: Dannis Dy, MD;  Location: [facility] OR;  Service: Vascular;  Laterality: Right;   PERIPHERAL VASCULAR THROMBECTOMY   03/05/2024   Procedure: PERIPHERAL VASCULAR THROMBECTOMY;  Surgeon: Patrick Boor, MD;  Location: [facility] INVASIVE CV LAB;  Service: Cardiovascular;;    No Known Allergies  Prior to Admission medications   Medication Sig Start Date End Date Taking? Authorizing Provider  labetalol  (NORMODYNE ) 200 MG tablet Take 200 mg by mouth 2 (two) times daily. 11/10/22   [provider]  losartan  (COZAAR ) 50 MG tablet Take 50 mg by mouth daily.    [provider]  torsemide (DEMADEX) 100 MG tablet Take 100 mg by mouth daily. 11/07/23   [provider]  colchicine  0.6 MG tablet Take one pill an hour after your first dose in the ED Patient not taking: Reported on 08/25/2020 08/10/20 08/25/20  Albertus Hughs, DO    Social History   Socioeconomic History   Marital status: Married    Spouse name: Not on file   Number of children: 2   Years of education: Masters   Highest education level: Not on file  Occupational History   Occupation: PRINCIPAL    Employer: [employer]  Tobacco Use   Smoking status: Never    Passive exposure: Past   Smokeless tobacco: Never  Vaping Use   Vaping status: Never Used  Substance and Sexual Activity   Alcohol use: No    Alcohol/week: 0.0 standard drinks of alcohol    Comment: former drinker   Drug use: No   Sexual activity: Yes  Partners: Female  Other Topics Concern   Not on file  Social History Narrative   Marital status: married      CHildren;  2 children; no grandchildren.      Lives at home alone.      Employment:  Runner, broadcasting/film/video; principal in past.      Tobacco: none      Alcohol: weekends      Exercise:  Walking; weightlifting; basketball.   Right handed.   2 cups caffeine /day.   Social Drivers of Health   Financial Resource Strain: Medium Risk (12/15/2020)   Overall Financial Resource Strain (CARDIA)    Difficulty of Paying Living Expenses: Somewhat hard  Food Insecurity: No Food Insecurity (09/29/2022)   Hunger Vital Sign     Worried About Running Out of Food in the Last Year: Never true    Ran Out of Food in the Last Year: Never true  Transportation Needs: No Transportation Needs (09/29/2022)   PRAPARE - Administrator, Civil Service (Medical): No    Lack of Transportation (Non-Medical): No  Physical Activity: Inactive (12/15/2020)   Exercise Vital Sign    Days of Exercise per Week: 0 days    Minutes of Exercise per Session: 0 min  Stress: Not on file  Social Connections: Not on file  Intimate Partner Violence: Not At Risk (09/29/2022)   Humiliation, Afraid, Rape, and Kick questionnaire    Fear of Current or Ex-Partner: No    Emotionally Abused: No    Physically Abused: No    Sexually Abused: No     Family History  Problem Relation Age of Onset   Stroke Mother    Hypertension Mother    Hypertension Daughter    Cancer Maternal Grandmother        lung   Healthy Father        Died in motorcycle accident    ROS: Negative except for HPI  Physical Examination  Vitals:   03/22/24 1305  BP: 134/79  Pulse: 66  Resp: 12  SpO2: 97%   Awake alert and oriented Nonlabored respirations Left upper arm AV graft without any pulsatility or thrill there is palpable pulse in the left brachial artery.  CBC    Component Value Date/Time   WBC 6.2 11/12/2022 1215   RBC 5.10 11/12/2022 1215   HGB 13.3 12/23/2022 0809   HGB 13.4 09/04/2018 1211   HCT 39.0 12/23/2022 0809   HCT 41.1 09/04/2018 1211   PLT 172 11/12/2022 1215   PLT 190 09/04/2018 1211   MCV 73.1 (L) 11/12/2022 1215   MCV 71 (L) 09/04/2018 1211   MCH 23.1 (L) 11/12/2022 1215   MCHC 31.6 11/12/2022 1215   RDW 14.3 11/12/2022 1215   RDW 15.8 (H) 09/04/2018 1211   LYMPHSABS 1.9 11/12/2022 1215   MONOABS 0.4 11/12/2022 1215   EOSABS 0.2 11/12/2022 1215   BASOSABS 0.1 11/12/2022 1215    BMET    Component Value Date/Time   NA 139 12/23/2022 0809   NA 136 08/06/2019 1400   K 3.6 12/23/2022 0809   CL 97 (L) 12/23/2022 0809    CO2 25 11/12/2022 1215   GLUCOSE 117 (H) 12/23/2022 0809   BUN 29 (H) 12/23/2022 0809   BUN 20 08/06/2019 1400   CREATININE 4.60 (H) 12/23/2022 0809   CREATININE 1.28 08/19/2015 0844   CALCIUM  8.8 (L) 11/12/2022 1215   GFRNONAA 7 (L) 11/12/2022 1215   GFRNONAA 76 12/01/2014 1037   GFRAA 33 (L) 08/06/2019  1400   GFRAA 88 12/01/2014 1037    COAGS: Lab Results  Component Value Date   INR 1.0 08/25/2020   INR 0.98 12/08/2014     Non-Invasive Vascular Imaging:   Previous fistulogram reviewed  ASSESSMENT/PLAN: This is a 68 y.o. male with thrombosed left upper arm AV graft.  We discussed proceeding with thrombectomy but any additional procedures would require open surgical intervention either thrombectomy versus conversion to new graft.  We also discussed that failure of thrombectomy would require tunneled dialysis catheter placement.  He demonstrates good understanding.  Kierstynn Babich C. Vikki Graves, MD [facility] [place] Office: [phone] Pager: [phone]

## 2024-03-26 ENCOUNTER — Encounter (HOSPITAL_COMMUNITY): Payer: Self-pay | Admitting: Vascular Surgery

## 2024-05-31 ENCOUNTER — Encounter (HOSPITAL_COMMUNITY)
Admission: RE | Disposition: A | Discharge status: Home / Self Care | Payer: Self-pay | Source: Ambulatory Visit | Attending: Vascular Surgery

## 2024-05-31 ENCOUNTER — Other Ambulatory Visit: Payer: Self-pay

## 2024-05-31 ENCOUNTER — Ambulatory Visit (HOSPITAL_COMMUNITY)
Admission: RE | Admit: 2024-05-31 | Discharge: 2024-05-31 | Disposition: A | Discharge status: Home / Self Care | Source: Ambulatory Visit | Attending: Vascular Surgery | Admitting: Vascular Surgery

## 2024-05-31 DIAGNOSIS — N186 End stage renal disease: Secondary | ICD-10-CM | POA: Insufficient documentation

## 2024-05-31 DIAGNOSIS — Y832 Surgical operation with anastomosis, bypass or graft as the cause of abnormal reaction of the patient, or of later complication, without mention of misadventure at the time of the procedure: Secondary | ICD-10-CM | POA: Diagnosis not present

## 2024-05-31 DIAGNOSIS — I120 Hypertensive chronic kidney disease with stage 5 chronic kidney disease or end stage renal disease: Secondary | ICD-10-CM | POA: Diagnosis not present

## 2024-05-31 DIAGNOSIS — T82868A Thrombosis of vascular prosthetic devices, implants and grafts, initial encounter: Secondary | ICD-10-CM | POA: Insufficient documentation

## 2024-05-31 DIAGNOSIS — Z992 Dependence on renal dialysis: Secondary | ICD-10-CM | POA: Diagnosis not present

## 2024-05-31 DIAGNOSIS — E1122 Type 2 diabetes mellitus with diabetic chronic kidney disease: Secondary | ICD-10-CM | POA: Diagnosis not present

## 2024-05-31 HISTORY — PX: PERIPHERAL VASCULAR THROMBECTOMY: CATH118306

## 2024-05-31 SURGERY — PERIPHERAL VASCULAR THROMBECTOMY
Anesthesia: LOCAL | Site: Arm Upper | Laterality: Left

## 2024-05-31 MED ORDER — LIDOCAINE HCL (PF) 1 % IJ SOLN
INTRAMUSCULAR | Status: AC
Start: 2024-05-31 — End: 2024-05-31
  Filled 2024-05-31: qty 30

## 2024-05-31 MED ORDER — IODIXANOL 320 MG/ML IV SOLN
INTRAVENOUS | Status: DC | PRN
  Administered 2024-05-31: 25 mL

## 2024-05-31 MED ORDER — HEPARIN SODIUM (PORCINE) 1000 UNIT/ML IJ SOLN
INTRAMUSCULAR | Status: DC | PRN
  Administered 2024-05-31: 8000 [IU] via INTRAVENOUS

## 2024-05-31 MED ORDER — HEPARIN (PORCINE) IN NACL 1000-0.9 UT/500ML-% IV SOLN
INTRAVENOUS | Status: DC | PRN
  Administered 2024-05-31: 500 mL

## 2024-05-31 MED ORDER — LIDOCAINE HCL (PF) 1 % IJ SOLN
INTRAMUSCULAR | Status: DC | PRN
  Administered 2024-05-31 (×2): 5 mL

## 2024-05-31 MED ORDER — HEPARIN SODIUM (PORCINE) 1000 UNIT/ML IJ SOLN
INTRAMUSCULAR | Status: AC
  Filled 2024-05-31: qty 10

## 2024-05-31 SURGICAL SUPPLY — 11 items
BALLOON FOGARTY 5FR 40 (CATHETERS) IMPLANT
BALLOON MUSTANG 7X100X75 (BALLOONS) IMPLANT
CATH MACH 1 ST 7FR 55 (CATHETERS) IMPLANT
GUIDEWIRE ANGLED .035X150CM (WIRE) IMPLANT
KIT ENCORE 26 ADVANTAGE (KITS) IMPLANT
KIT MICROPUNCTURE NIT STIFF (SHEATH) IMPLANT
SHEATH PINNACLE R/O II 6F 4CM (SHEATH) IMPLANT
SHEATH PINNACLE R/O II 7F 4CM (SHEATH) IMPLANT
STOPCOCK MORSE 400PSI 3WAY (MISCELLANEOUS) IMPLANT
TUBING CIL FLEX 10 FLL-RA (TUBING) IMPLANT
WIRE BENTSON .035X145CM (WIRE) IMPLANT

## 2024-05-31 NOTE — H&P (Signed)
 H&P     MRN #:  [id number]  History of Present Illness: This is a 68 y.o. male with end-stage renal disease that presents for declot of thrombosed left arm AV graft.  States he woke up this morning could not feel a thrill.  This has undergone multiple declots as recently as May.  Past Medical History:  Diagnosis Date   Chronic kidney disease    Tu/Th/Sa   Diabetes mellitus without complication (HCC)    Type II- does not take medicines or check levels at home.   Gout    1-2 exacerbations per year.  Takes Advil PRN for pain.   Hyperlipidemia    Hypertension    Migraine    Stroke (HCC) 07/01/2012   L sided weakness, slurred speech.  Admission [facility].     Vitamin D  deficiency 08/09/2019    Past Surgical History:  Procedure Laterality Date   A/V SHUNT INTERVENTION N/A 03/05/2024   Procedure: A/V SHUNT INTERVENTION;  Surgeon: Melia Lynwood ORN, MD;  Location: [facility] INVASIVE CV LAB;  Service: Cardiovascular;  Laterality: N/A;   A/V SHUNT INTERVENTION N/A 03/22/2024   Procedure: A/V SHUNT INTERVENTION;  Surgeon: Sheree Penne Bruckner, MD;  Location: [facility] LAB;  Service: Cardiovascular;  Laterality: N/A;   Admission  07/01/2012   CVA (L sided weakness, slurred speech).  [facility].   AV FISTULA PLACEMENT Left 12/21/2020   Procedure: INSERTION OF ARTERIOVENOUS (AV) GORE-TEX GRAFT ARM;  Surgeon: Eliza Bruckner RAMAN, MD;  Location: [facility] OR;  Service: Vascular;  Laterality: Left;   CAPD INSERTION N/A 09/29/2022   Procedure: LAPAROSCOPIC INSERTION CONTINUOUS AMBULATORY PERITONEAL DIALYSIS  (CAPD) CATHETER;  Surgeon: Serene Gaile ORN, MD;  Location: [facility] OR;  Service: Vascular;  Laterality: N/A;   CAPD REMOVAL N/A 12/23/2022   Procedure: REMOVAL CONTINUOUS AMBULATORY PERITONEAL DIALYSIS  (CAPD) CATHETER;  Surgeon: Serene Gaile ORN, MD;  Location: [facility] OR;  Service: Vascular;  Laterality: N/A;   COLONOSCOPY  10/31/2008   no polyps.  Repeat in 10 years.  [place].   HERNIA REPAIR     INSERTION OF  DIALYSIS CATHETER Right 12/21/2020   Procedure: INSERTION OF TUNNELED  DIALYSIS CATHETER RIGHT INTERNAL JUGULAR;  Surgeon: Eliza Bruckner RAMAN, MD;  Location: [facility] OR;  Service: Vascular;  Laterality: Right;   PERIPHERAL VASCULAR THROMBECTOMY  03/05/2024   Procedure: PERIPHERAL VASCULAR THROMBECTOMY;  Surgeon: Melia Lynwood ORN, MD;  Location: [facility] INVASIVE CV LAB;  Service: Cardiovascular;;   UPPER EXTREMITY INTERVENTION Left 03/22/2024   Procedure: UPPER EXTREMITY INTERVENTION;  Surgeon: Sheree Penne Bruckner, MD;  Location: [facility];  Service: Cardiovascular;  Laterality: Left;  [facility] Stent   VENOUS ANGIOPLASTY  03/22/2024   Procedure: VENOUS ANGIOPLASTY;  Surgeon: Sheree Penne Bruckner, MD;  Location: [facility];  Service: Cardiovascular;;    No Known Allergies  Prior to Admission medications   Medication Sig Start Date End Date Taking? Authorizing Provider  labetalol  (NORMODYNE ) 200 MG tablet Take 200 mg by mouth 2 (two) times daily. 11/10/22   [provider]  losartan  (COZAAR ) 50 MG tablet Take 50 mg by mouth daily.    [provider]  torsemide (DEMADEX) 100 MG tablet Take 100 mg by mouth daily. 11/07/23   [provider]  colchicine  0.6 MG tablet Take one pill an hour after your first dose in the ED Patient not taking: Reported on 08/25/2020 08/10/20 08/25/20  Emil Share, DO    Social History   Socioeconomic History   Marital status: Married  Spouse name: Not on file   Number of children: 2   Years of education: Masters   Highest education level: Not on file  Occupational History   Occupation: PRINCIPAL    Employer: [employer]  Tobacco Use   Smoking status: Never    Passive exposure: Past   Smokeless tobacco: Never  Vaping Use   Vaping status: Never Used  Substance and Sexual Activity   Alcohol use: No    Alcohol/week: 0.0 standard drinks of alcohol    Comment: former drinker   Drug use: No   Sexual activity: Yes    Partners: Female   Other Topics Concern   Not on file  Social History Narrative   Marital status: married      CHildren;  2 children; no grandchildren.      Lives at home alone.      Employment:  Runner, broadcasting/film/video; principal in past.      Tobacco: none      Alcohol: weekends      Exercise:  Walking; weightlifting; basketball.   Right handed.   2 cups caffeine /day.   Social Drivers of Health   Financial Resource Strain: Medium Risk (12/15/2020)   Overall Financial Resource Strain (CARDIA)    Difficulty of Paying Living Expenses: Somewhat hard  Food Insecurity: No Food Insecurity (09/29/2022)   Hunger Vital Sign    Worried About Running Out of Food in the Last Year: Never true    Ran Out of Food in the Last Year: Never true  Transportation Needs: No Transportation Needs (09/29/2022)   PRAPARE - Administrator, Civil Service (Medical): No    Lack of Transportation (Non-Medical): No  Physical Activity: Inactive (12/15/2020)   Exercise Vital Sign    Days of Exercise per Week: 0 days    Minutes of Exercise per Session: 0 min  Stress: Not on file  Social Connections: Not on file  Intimate Partner Violence: Not At Risk (09/29/2022)   Humiliation, Afraid, Rape, and Kick questionnaire    Fear of Current or Ex-Partner: No    Emotionally Abused: No    Physically Abused: No    Sexually Abused: No     Family History  Problem Relation Age of Onset   Stroke Mother    Hypertension Mother    Hypertension Daughter    Cancer Maternal Grandmother        lung   Healthy Father        Died in motorcycle accident    ROS: [x]  Positive   [ ]  Negative   [ ]  All sytems reviewed and are negative  Cardiovascular: []  chest pain/pressure []  palpitations []  SOB lying flat []  DOE []  pain in legs while walking []  pain in legs at rest []  pain in legs at night []  non-healing ulcers []  hx of DVT []  swelling in legs  Pulmonary: []  productive cough []  asthma/wheezing []  home O2  Neurologic: []  weakness  in []  arms []  legs []  numbness in []  arms []  legs []  hx of CVA []  mini stroke [] difficulty speaking or slurred speech []  temporary loss of vision in one eye []  dizziness  Hematologic: []  hx of cancer []  bleeding problems []  problems with blood clotting easily  Endocrine:   []  diabetes []  thyroid  disease  GI []  vomiting blood []  blood in stool  GU: []  CKD/renal failure []  HD--[]  M/W/F or []  T/T/S []  burning with urination []  blood in urine  Psychiatric: []  anxiety []  depression  Musculoskeletal: []   arthritis []  joint pain  Integumentary: []  rashes []  ulcers  Constitutional: []  fever []  chills   Physical Examination  Vitals:   05/31/24 0909  BP: 111/75  Pulse: 90  Resp: 12  Temp: 98.5 F (36.9 C)  SpO2: 94%   There is no height or weight on file to calculate BMI.  General:  WDWN in NAD Gait: Not observed HENT: WNL, normocephalic Pulmonary: normal non-labored breathing Cardiac: regular, without  Murmurs, rubs or gallops Abdomen:  soft, NT/ND Vascular Exam/Pulses: Left upper arm AV graft with no appreciable thrill Left radial pulse palpable Musculoskeletal: no muscle wasting or atrophy  Neurologic: A&O X 3; Appropriate Affect ; SENSATION: normal; MOTOR FUNCTION:  moving all extremities equally. Speech is fluent/normal   CBC    Component Value Date/Time   WBC 6.2 11/12/2022 1215   RBC 5.10 11/12/2022 1215   HGB 13.3 12/23/2022 0809   HGB 13.4 09/04/2018 1211   HCT 39.0 12/23/2022 0809   HCT 41.1 09/04/2018 1211   PLT 172 11/12/2022 1215   PLT 190 09/04/2018 1211   MCV 73.1 (L) 11/12/2022 1215   MCV 71 (L) 09/04/2018 1211   MCH 23.1 (L) 11/12/2022 1215   MCHC 31.6 11/12/2022 1215   RDW 14.3 11/12/2022 1215   RDW 15.8 (H) 09/04/2018 1211   LYMPHSABS 1.9 11/12/2022 1215   MONOABS 0.4 11/12/2022 1215   EOSABS 0.2 11/12/2022 1215   BASOSABS 0.1 11/12/2022 1215    BMET    Component Value Date/Time   NA 139 12/23/2022 0809   NA 136  08/06/2019 1400   K 3.6 12/23/2022 0809   CL 97 (L) 12/23/2022 0809   CO2 25 11/12/2022 1215   GLUCOSE 117 (H) 12/23/2022 0809   BUN 29 (H) 12/23/2022 0809   BUN 20 08/06/2019 1400   CREATININE 4.60 (H) 12/23/2022 0809   CREATININE 1.28 08/19/2015 0844   CALCIUM  8.8 (L) 11/12/2022 1215   GFRNONAA 7 (L) 11/12/2022 1215   GFRNONAA 76 12/01/2014 1037   GFRAA 33 (L) 08/06/2019 1400   GFRAA 88 12/01/2014 1037    COAGS: Lab Results  Component Value Date   INR 1.0 08/25/2020   INR 0.98 12/08/2014     Non-Invasive Vascular Imaging:      ASSESSMENT/PLAN: This is a 68 y.o. male with end-stage renal disease that presents for declot of thrombosed left arm AV graft.  States he woke up this morning could not feel a thrill.  This has undergone multiple declots as recently as May.  Discussed plan for declot of left arm AV graft including risk benefits.  If unable to declot will need catheter  Lonni DOROTHA Gaskins, MD [facility] [place] Office: [phone]  Lonni JINNY Gaskins

## 2024-05-31 NOTE — Op Note (Signed)
    Patient name: Ruben Camacho. MRN: [id number] DOB: [birth year] Sex: male  05/31/2024 Pre-operative Diagnosis: Occluded left upper arm AV graft with ESRD Post-operative diagnosis:  Same Surgeon:  Lonni DOROTHA Gaskins, MD Procedure Performed: 1.  Ultrasound-guided access left arm AV graft with antegrade and retrograde sheaths 2.  Declot with percutaneous mechanical thrombectomy and balloon angioplasty left upper arm AV graft 3.  Left upper extremity fistulogram including central venogram  Indications: 68 year old male using a left upper arm AV graft that presents for declot after he could not feel a thrill this morning.  Risk-benefits discussed.  Findings:   Left upper arm graft was thrombosed.  Initially accessed antegrade with a 7 French sheath toward the venous anastomosis.  I used a end hole aspiration catheter for percutaneous mechanical thrombectomy and made about 4 passes retrieving acute thrombus.  I then used a 7 mm balloon to macerate the clot.  I then accessed the fistula retrograde and used a Glidewire to get across the arterial anastomosis.  I then used an Civil engineer, contracting.  Widely patent graft at completion with excellent thrill.  Left axillary vein stent widely patent after balloon angioplasty.   Procedure:  The patient was identified in the holding area and taken to [facility] lab.  Placed on the table in supine position.  The left arm was prepped and draped standard sterile fashion.  Timeout was performed.  Initially used ultrasound to access the upper arm AV graft with micro access needle and placed a microwire and micro sheath.  I then used a Bentson wire exchanged for 7 French sheath antegrade toward the venous anastomosis.  I gave 8000 units of IV heparin  through the sheath.  I initially used a endhole aspiration catheter and performed about 4 passes aspirating acute appearing thrombus including across the axillary stent.  I then used a Bentson wire and macerated the clot with  a long 7 mm x 100 mm Mustang throughout the upper arm graft including at the axillary stent.  I then accessed the graft retrograde again using ultrasound guidance and placing micro access needle, microwire, and micro sheath and then used a Glidewire to get across the arterial anastomosis into the brachial artery.  I placed a 6 French sheath retrograde.  I then used an over-the-wire Fogarty to perform mechanical thrombectomy at the arterial anastomosis.  There was an excellent thrill.  I got a retrograde sheath shot that showed no thrombus in the brachial artery or adjacent to the arterial anastomosis.  The retrograde sheath was removed tying down the pursestring.  I then shot a venogram of the left upper arm including of the AV graft.  I elected to treat the entire graft again with a long 7 mm 100 mm Mustang including the axillary vein stent.  Widely patent at completion with good thrill.  Pursestring tied down and sheath removed     Lonni DOROTHA Gaskins, MD [facility]: [phone]

## 2024-06-03 ENCOUNTER — Encounter (HOSPITAL_COMMUNITY): Payer: Self-pay | Admitting: Vascular Surgery

## 2024-07-08 ENCOUNTER — Ambulatory Visit (HOSPITAL_COMMUNITY)
Admission: RE | Admit: 2024-07-08 | Discharge: 2024-07-08 | Disposition: A | Discharge status: Home / Self Care | Attending: Vascular Surgery | Admitting: Vascular Surgery

## 2024-07-08 ENCOUNTER — Encounter (HOSPITAL_COMMUNITY)
Admission: RE | Disposition: A | Discharge status: Home / Self Care | Payer: Self-pay | Source: Home / Self Care | Attending: Vascular Surgery

## 2024-07-08 ENCOUNTER — Other Ambulatory Visit: Payer: Self-pay

## 2024-07-08 DIAGNOSIS — I120 Hypertensive chronic kidney disease with stage 5 chronic kidney disease or end stage renal disease: Secondary | ICD-10-CM | POA: Diagnosis not present

## 2024-07-08 DIAGNOSIS — Y832 Surgical operation with anastomosis, bypass or graft as the cause of abnormal reaction of the patient, or of later complication, without mention of misadventure at the time of the procedure: Secondary | ICD-10-CM | POA: Insufficient documentation

## 2024-07-08 DIAGNOSIS — E1122 Type 2 diabetes mellitus with diabetic chronic kidney disease: Secondary | ICD-10-CM | POA: Diagnosis not present

## 2024-07-08 DIAGNOSIS — Z7722 Contact with and (suspected) exposure to environmental tobacco smoke (acute) (chronic): Secondary | ICD-10-CM | POA: Insufficient documentation

## 2024-07-08 DIAGNOSIS — N186 End stage renal disease: Secondary | ICD-10-CM | POA: Insufficient documentation

## 2024-07-08 DIAGNOSIS — T82858A Stenosis of vascular prosthetic devices, implants and grafts, initial encounter: Secondary | ICD-10-CM | POA: Diagnosis not present

## 2024-07-08 DIAGNOSIS — T82868A Thrombosis of vascular prosthetic devices, implants and grafts, initial encounter: Secondary | ICD-10-CM | POA: Insufficient documentation

## 2024-07-08 DIAGNOSIS — Z992 Dependence on renal dialysis: Secondary | ICD-10-CM | POA: Insufficient documentation

## 2024-07-08 DIAGNOSIS — Z5986 Financial insecurity: Secondary | ICD-10-CM | POA: Insufficient documentation

## 2024-07-08 HISTORY — PX: VENOUS ANGIOPLASTY: CATH118376

## 2024-07-08 HISTORY — PX: VENOUS STENT: CATH118377

## 2024-07-08 HISTORY — PX: PERIPHERAL VASCULAR THROMBECTOMY: CATH118306

## 2024-07-08 SURGERY — PERIPHERAL VASCULAR THROMBECTOMY
Anesthesia: LOCAL | Site: Arm Upper | Laterality: Left

## 2024-07-08 MED ORDER — APIXABAN 5 MG PO TABS: 5.0000 mg | ORAL_TABLET | Freq: Two times a day (BID) | ORAL | 11 refills | Status: DC

## 2024-07-08 MED ORDER — ASPIRIN 81 MG PO TBEC
81.0000 mg | DELAYED_RELEASE_TABLET | Freq: Every day | ORAL | 2 refills | Status: AC
Start: 2024-07-08 — End: 2025-07-08

## 2024-07-08 MED ORDER — HEPARIN SODIUM (PORCINE) 1000 UNIT/ML IJ SOLN
INTRAMUSCULAR | Status: AC
  Filled 2024-07-08: qty 10

## 2024-07-08 MED ORDER — LIDOCAINE HCL (PF) 1 % IJ SOLN
INTRAMUSCULAR | Status: DC | PRN
  Administered 2024-07-08 (×3): 2 mL via SUBCUTANEOUS

## 2024-07-08 MED ORDER — IODIXANOL 320 MG/ML IV SOLN
INTRAVENOUS | Status: DC | PRN
  Administered 2024-07-08: 10 mL via INTRAVENOUS

## 2024-07-08 MED ORDER — HEPARIN (PORCINE) IN NACL 1000-0.9 UT/500ML-% IV SOLN
INTRAVENOUS | Status: DC | PRN
  Administered 2024-07-08: 500 mL

## 2024-07-08 MED ORDER — LIDOCAINE HCL (PF) 1 % IJ SOLN
INTRAMUSCULAR | Status: AC
  Filled 2024-07-08: qty 30

## 2024-07-08 MED ORDER — CLOPIDOGREL BISULFATE 75 MG PO TABS
75.0000 mg | ORAL_TABLET | Freq: Every day | ORAL | 11 refills | Status: AC
Start: 2024-07-08 — End: 2025-07-08

## 2024-07-08 MED ORDER — HEPARIN SODIUM (PORCINE) 1000 UNIT/ML IJ SOLN
INTRAMUSCULAR | Status: DC | PRN
  Administered 2024-07-08: 10000 [IU] via INTRAVENOUS

## 2024-07-08 SURGICAL SUPPLY — 15 items
BALLOON FOGARTY 5FR 40 (CATHETERS) IMPLANT
BALLOON MUSTANG 7X80X75 (BALLOONS) IMPLANT
CATH MACH 1 ST 7FR 55 (CATHETERS) IMPLANT
CATH SLIP KMP 65CM 5FR (CATHETERS) IMPLANT
GUIDEWIRE ANGLED .035 180CM (WIRE) IMPLANT
KIT ENCORE 26 ADVANTAGE (KITS) IMPLANT
KIT MICROPUNCTURE NIT STIFF (SHEATH) IMPLANT
SHEATH PINNACLE 8F 10CM (SHEATH) IMPLANT
SHEATH PINNACLE R/O II 6F 4CM (SHEATH) IMPLANT
SHEATH PINNACLE R/O II 7F 4CM (SHEATH) IMPLANT
SHEATH PROBE COVER 6X72 (BAG) IMPLANT
STENT VIABAHN 8X150X75 (Permanent Stent) IMPLANT
TRAY PV CATH (CUSTOM PROCEDURE TRAY) ×2 IMPLANT
WIRE BENTSON .035X145CM (WIRE) IMPLANT
WIRE TORQFLEX AUST .018X40CM (WIRE) IMPLANT

## 2024-07-08 NOTE — Op Note (Signed)
 DATE OF SERVICE: 07/08/2024  PATIENT:  Ruben Camacho.  68 y.o. male  PRE-OPERATIVE DIAGNOSIS:  end-stage renal disease; thrombosed left arm av graft  POST-OPERATIVE DIAGNOSIS:  Same  PROCEDURE:   1) Ultrasound guided left arm AVG access x 3 (CPT [phone]) 2) Left upper extremity graft percutaneous thrombectomy and stent placement  (CPT [phone]) 3) established outpatient evaluation and management - level 3 (CPT 99213)  SURGEON:  Debby SAILOR. Magda, MD  ASSISTANT: none  ANESTHESIA:   local  ESTIMATED BLOOD LOSS: min  LOCAL MEDICATIONS USED:  LIDOCAINE    COUNTS: confirmed correct.  PATIENT DISPOSITION:  PACU - hemodynamically stable.   Delay start of Pharmacological VTE agent (>24hrs) due to surgical blood loss or risk of bleeding: no  INDICATION FOR PROCEDURE: Ruben Camacho. is a 68 y.o. male with end-stage renal disease with a thrombosed left arm AV graft.. After careful discussion of risks, benefits, and alternatives the patient was offered thrombectomy. We specifically discussed risk of thrombectomy not being effective and needing tunneled dialysis catheter. The patient understood and wished to proceed.  OPERATIVE FINDINGS:  Ultimately successful thrombectomy through antegrade and retrograde access in the left upper extremity AV graft.  Significant stenosis based on wire and balloon behavior in the outflow stent.  Residual thrombus in the graft near the stent after thrombectomy.  I elected to cover the existing stent and extended a little further as well as the residual thrombus in the graft.  Good technical result to completion with strong thrill and brisk passage of contrast.  DESCRIPTION OF PROCEDURE: After identification of the patient in the pre-operative holding area, the patient was transferred to the operating room. The patient was positioned supine on the operating room table.  The left upper extremity was prepped and draped in standard fashion. A surgical pause was  performed confirming correct patient, procedure, and operative location.  The left upper extremity was anesthetized with subcutaneous injection of 1% lidocaine  over the area of planned access. Using ultrasound guidance, the left arm dialysis access was accessed with micropuncture technique in both a retrograde and antegrade direction.  In the arterial facing access, access was upsized to 6 [place].  Glidewire was navigated in the brachial artery and multiple passes of an over-the-wire Fogarty were performed.  In the venous facing access access was upsized to 7 [place].  Glidewire was navigated into the central veins.  Balloon angioplasty was performed of the graft and the outflow stent with a 7 x 80 mm Mustang balloon.  Balloon maceration was also performed.  Mechanical thrombectomy was performed with a 7 [place] guide cath with fairly significant return of thrombus.  There was some stenosis noted at the existing graft based on balloon and wire behavior.  Heparin  was delivered via venous catheter.  Graft patency was not restored after these maneuvers.  I repeated the arterial thrombectomy and did restore graft patency.  An additional angiogram was performed showing some residual thrombus in the graft.  Given the behavior of the wire against the existing outflow stent and the residual thrombus side deployed an 8 x 150 mm Viabahn stent across the existing outflow stent and into the graft with good result.  Follow-up angiogram showed much improved transit of contrast.  The graft had a strong thrill.  All endovascular equipment was removed.  A figure-of-eight stitch was applied to the exit site with good hemostasis.  Sterile bandage was applied.  Upon completion of the case instrument and sharps counts were confirmed correct. The patient  was transferred to the PACU in good condition. I was present for all portions of the procedure.  PLAN: Start aspirin  and Plavix .  DOAC prohibitively expensive with his insurance  coverage.  Graft is NOT amenable to future endovascular intervention.  Should this graft clot tunneled dialysis catheter should be placed and new access created.  Debby SAILOR. Magda, MD [facility] Phone Number: [phone] 07/08/2024 10:21 AM

## 2024-07-08 NOTE — H&P (Signed)
 [facility] [place]  ASSESSMENT / PLAN: 68 y.o. male with thrombosed left arm AVG. This has thrombosed multiple times in the past months. I counseled him that thrombectomy may not be successful today. He was hopeful to try thrombectomy before abandoning the access and so I did offer it to him.   CHIEF COMPLAINT: ESRD, thrombosed graft  HISTORY OF PRESENT ILLNESS: Ruben Camacho. is a 68 y.o. male with end-stage renal disease dialyzing through a left arm AV graft.  The graft has thrombosed.  It is thrombosed multiple times in the past months.  He has undergone previous percutaneous thrombectomy with good result.  I counseled the patient that thrombectomy may not be effective today.  He is understanding and still wishes to proceed.  Past Medical History:  Diagnosis Date   Chronic kidney disease    Tu/Th/Sa   Diabetes mellitus without complication (HCC)    Type II- does not take medicines or check levels at home.   Gout    1-2 exacerbations per year.  Takes Advil PRN for pain.   Hyperlipidemia    Hypertension    Migraine    Stroke (HCC) 07/01/2012   L sided weakness, slurred speech.  Admission [facility].     Vitamin D  deficiency 08/09/2019    Past Surgical History:  Procedure Laterality Date   A/V SHUNT INTERVENTION N/A 03/05/2024   Procedure: A/V SHUNT INTERVENTION;  Surgeon: Melia Lynwood ORN, MD;  Location: [facility] INVASIVE CV LAB;  Service: Cardiovascular;  Laterality: N/A;   A/V SHUNT INTERVENTION N/A 03/22/2024   Procedure: A/V SHUNT INTERVENTION;  Surgeon: Sheree Penne Bruckner, MD;  Location: [facility] PV LAB;  Service: Cardiovascular;  Laterality: N/A;   Admission  07/01/2012   CVA (L sided weakness, slurred speech).  [facility].   AV FISTULA PLACEMENT Left 12/21/2020   Procedure: INSERTION OF ARTERIOVENOUS (AV) GORE-TEX GRAFT ARM;  Surgeon: Eliza Bruckner RAMAN, MD;  Location: [facility] OR;  Service: Vascular;  Laterality: Left;   CAPD INSERTION N/A 09/29/2022    Procedure: LAPAROSCOPIC INSERTION CONTINUOUS AMBULATORY PERITONEAL DIALYSIS  (CAPD) CATHETER;  Surgeon: Serene Gaile ORN, MD;  Location: [facility] OR;  Service: Vascular;  Laterality: N/A;   CAPD REMOVAL N/A 12/23/2022   Procedure: REMOVAL CONTINUOUS AMBULATORY PERITONEAL DIALYSIS  (CAPD) CATHETER;  Surgeon: Serene Gaile ORN, MD;  Location: [facility] OR;  Service: Vascular;  Laterality: N/A;   COLONOSCOPY  10/31/2008   no polyps.  Repeat in 10 years.  [place].   HERNIA REPAIR     INSERTION OF DIALYSIS CATHETER Right 12/21/2020   Procedure: INSERTION OF TUNNELED  DIALYSIS CATHETER RIGHT INTERNAL JUGULAR;  Surgeon: Eliza Bruckner RAMAN, MD;  Location: [facility] OR;  Service: Vascular;  Laterality: Right;   PERIPHERAL VASCULAR THROMBECTOMY  03/05/2024   Procedure: PERIPHERAL VASCULAR THROMBECTOMY;  Surgeon: Melia Lynwood ORN, MD;  Location: [facility] INVASIVE CV LAB;  Service: Cardiovascular;;   PERIPHERAL VASCULAR THROMBECTOMY Left 05/31/2024   Procedure: PERIPHERAL VASCULAR THROMBECTOMY;  Surgeon: Gretta Bruckner PARAS, MD;  Location: [facility] PV LAB;  Service: Cardiovascular;  Laterality: Left;   UPPER EXTREMITY INTERVENTION Left 03/22/2024   Procedure: UPPER EXTREMITY INTERVENTION;  Surgeon: Sheree Penne Bruckner, MD;  Location: [facility] LAB;  Service: Cardiovascular;  Laterality: Left;  [facility] Stent   VENOUS ANGIOPLASTY  03/22/2024   Procedure: VENOUS ANGIOPLASTY;  Surgeon: Sheree Penne Bruckner, MD;  Location: [facility] PV LAB;  Service: Cardiovascular;;    Family History  Problem Relation Age of Onset   Stroke Mother  Hypertension Mother    Hypertension Daughter    Cancer Maternal Grandmother        lung   Healthy Father        Died in motorcycle accident    Social History   Socioeconomic History   Marital status: Married    Spouse name: Not on file   Number of children: 2   Years of education: Masters   Highest education level: Not on file  Occupational History   Occupation: PRINCIPAL    Employer: [employer]   Tobacco Use   Smoking status: Never    Passive exposure: Past   Smokeless tobacco: Never  Vaping Use   Vaping status: Never Used  Substance and Sexual Activity   Alcohol use: No    Alcohol/week: 0.0 standard drinks of alcohol    Comment: former drinker   Drug use: No   Sexual activity: Yes    Partners: Female  Other Topics Concern   Not on file  Social History Narrative   Marital status: married      CHildren;  2 children; no grandchildren.      Lives at home alone.      Employment:  Runner, broadcasting/film/video; principal in past.      Tobacco: none      Alcohol: weekends      Exercise:  Walking; weightlifting; basketball.   Right handed.   2 cups caffeine /day.   Social Drivers of Health   Financial Resource Strain: Medium Risk (12/15/2020)   Overall Financial Resource Strain (CARDIA)    Difficulty of Paying Living Expenses: Somewhat hard  Food Insecurity: No Food Insecurity (09/29/2022)   Hunger Vital Sign    Worried About Running Out of Food in the Last Year: Never true    Ran Out of Food in the Last Year: Never true  Transportation Needs: No Transportation Needs (09/29/2022)   PRAPARE - Administrator, Civil Service (Medical): No    Lack of Transportation (Non-Medical): No  Physical Activity: Inactive (12/15/2020)   Exercise Vital Sign    Days of Exercise per Week: 0 days    Minutes of Exercise per Session: 0 min  Stress: Not on file  Social Connections: Not on file  Intimate Partner Violence: Not At Risk (09/29/2022)   Humiliation, Afraid, Rape, and Kick questionnaire    Fear of Current or Ex-Partner: No    Emotionally Abused: No    Physically Abused: No    Sexually Abused: No    No Known Allergies  Current Facility-Administered Medications  Medication Dose Route Frequency Provider Last Rate Last Admin   Heparin  (Porcine) in NaCl 1000-0.9 UT/500ML-% SOLN    PRN Magda Debby SAILOR, MD   500 mL at 07/08/24 0913   heparin  sodium (porcine) injection    PRN Magda Debby SAILOR, MD   10,000 Units at 07/08/24 0942   iodixanol  (VISIPAQUE ) 320 MG/ML injection    PRN Magda Debby SAILOR, MD   10 mL at 07/08/24 0934   lidocaine  (PF) (XYLOCAINE ) 1 % injection    PRN Magda Debby SAILOR, MD   2 mL at 07/08/24 0951    PHYSICAL EXAM Vitals:   07/08/24 0837 07/08/24 0845 07/08/24 0905  BP: 129/70 128/76   Pulse: 84 77   Resp: 12 14   Temp: 98.2 F (36.8 C)    TempSrc: Oral    SpO2: 97% 96% 100%   No acute distress Regular rate and rhythm Unlabored breathing Left arm AV graft thrombosed  PERTINENT LABORATORY AND RADIOLOGIC DATA  Most recent CBC    Latest Ref Rng & Units 12/23/2022    8:09 AM 11/12/2022   12:15 PM 09/29/2022    2:02 PM  CBC  WBC 4.0 - 10.5 K/uL  6.2  8.3   Hemoglobin 13.0 - 17.0 g/dL 86.6  88.1  87.5   Hematocrit 39.0 - 52.0 % 39.0  37.3  40.8   Platelets 150 - 400 K/uL  172  204      Most recent CMP    Latest Ref Rng & Units 12/23/2022    8:09 AM 11/12/2022   12:15 PM 09/29/2022    2:02 PM  CMP  Glucose 70 - 99 mg/dL 882  854    BUN 8 - 23 mg/dL 29  51    Creatinine 9.38 - 1.24 mg/dL 5.39  2.17  4.99   Sodium 135 - 145 mmol/L 139  135    Potassium 3.5 - 5.1 mmol/L 3.6  3.2    Chloride 98 - 111 mmol/L 97  95    CO2 22 - 32 mmol/L  25    Calcium  8.9 - 10.3 mg/dL  8.8    Total Protein 6.5 - 8.1 g/dL  6.0    Total Bilirubin 0.3 - 1.2 mg/dL  0.6    Alkaline Phos 38 - 126 U/L  52    AST 15 - 41 U/L  21    ALT 0 - 44 U/L  18      Renal function CrCl cannot be calculated (Patient's most recent lab result is older than the maximum 21 days allowed.).  Hgb A1c MFr Bld (%)  Date Value  12/15/2020 7.0 (H)    LDL Calculated  Date Value Ref Range Status  09/04/2018 116 (H) 0 - 99 mg/dL Final   LDL Cholesterol  Date Value Ref Range Status  12/16/2020 74 0 - 99 mg/dL Final    Comment:           Total Cholesterol/HDL:CHD Risk Coronary Heart Disease Risk Table                     Men   Women  1/2 Average Risk   3.4   3.3   Average Risk       5.0   4.4  2 X Average Risk   9.6   7.1  3 X Average Risk  23.4   11.0        Use the calculated Patient Ratio above and the CHD Risk Table to determine the patient's CHD Risk.        ATP III CLASSIFICATION (LDL):  <100     mg/dL   Optimal  899-870  mg/dL   Near or Above                    Optimal  130-159  mg/dL   Borderline  839-810  mg/dL   High  >809     mg/dL   Very High Performed at [facility], [address], [place], [place] [address]     Debby SAILOR. Magda, MD [facility] Phone Number: [phone] 07/08/2024 10:16 AM   Total time spent on preparing this encounter including chart review, data review, collecting history, examining the patient, and coordinating care: 30 min  Portions of this report may have been transcribed using voice recognition software.  Every effort has been made to ensure  accuracy; however, inadvertent computerized transcription errors may still be present.

## 2024-07-09 ENCOUNTER — Encounter (HOSPITAL_COMMUNITY): Payer: Self-pay | Admitting: Vascular Surgery
